# Patient Record
Sex: Female | Born: 1945 | Race: Black or African American | Hispanic: No | Smoking: Never smoker
Health system: Southern US, Community
[De-identification: ages and names within clinical notes are randomized; demographics above are authoritative.]

## PROBLEM LIST (undated history)

## (undated) DIAGNOSIS — K226 Gastro-esophageal laceration-hemorrhage syndrome: Secondary | ICD-10-CM

## (undated) DIAGNOSIS — Z22322 Carrier or suspected carrier of Methicillin resistant Staphylococcus aureus: Secondary | ICD-10-CM

## (undated) DIAGNOSIS — G40901 Epilepsy, unspecified, not intractable, with status epilepticus: Secondary | ICD-10-CM

## (undated) DIAGNOSIS — K859 Acute pancreatitis without necrosis or infection, unspecified: Secondary | ICD-10-CM

## (undated) DIAGNOSIS — E872 Acidosis: Secondary | ICD-10-CM

## (undated) DIAGNOSIS — B029 Zoster without complications: Secondary | ICD-10-CM

## (undated) DIAGNOSIS — K219 Gastro-esophageal reflux disease without esophagitis: Secondary | ICD-10-CM

## (undated) DIAGNOSIS — I509 Heart failure, unspecified: Secondary | ICD-10-CM

## (undated) DIAGNOSIS — J9601 Acute respiratory failure with hypoxia: Secondary | ICD-10-CM

## (undated) DIAGNOSIS — K279 Peptic ulcer, site unspecified, unspecified as acute or chronic, without hemorrhage or perforation: Secondary | ICD-10-CM

## (undated) DIAGNOSIS — D62 Acute posthemorrhagic anemia: Secondary | ICD-10-CM

## (undated) DIAGNOSIS — I272 Pulmonary hypertension, unspecified: Secondary | ICD-10-CM

## (undated) DIAGNOSIS — M1990 Unspecified osteoarthritis, unspecified site: Secondary | ICD-10-CM

## (undated) DIAGNOSIS — F329 Major depressive disorder, single episode, unspecified: Secondary | ICD-10-CM

## (undated) DIAGNOSIS — I371 Nonrheumatic pulmonary valve insufficiency: Secondary | ICD-10-CM

## (undated) DIAGNOSIS — R197 Diarrhea, unspecified: Secondary | ICD-10-CM

## (undated) DIAGNOSIS — F32A Depression, unspecified: Secondary | ICD-10-CM

## (undated) DIAGNOSIS — F419 Anxiety disorder, unspecified: Secondary | ICD-10-CM

## (undated) DIAGNOSIS — M797 Fibromyalgia: Secondary | ICD-10-CM

## (undated) DIAGNOSIS — D649 Anemia, unspecified: Secondary | ICD-10-CM

## (undated) DIAGNOSIS — N289 Disorder of kidney and ureter, unspecified: Secondary | ICD-10-CM

## (undated) DIAGNOSIS — I890 Lymphedema, not elsewhere classified: Secondary | ICD-10-CM

## (undated) DIAGNOSIS — M75101 Unspecified rotator cuff tear or rupture of right shoulder, not specified as traumatic: Secondary | ICD-10-CM

## (undated) DIAGNOSIS — I10 Essential (primary) hypertension: Secondary | ICD-10-CM

## (undated) DIAGNOSIS — I5021 Acute systolic (congestive) heart failure: Secondary | ICD-10-CM

## (undated) DIAGNOSIS — C801 Malignant (primary) neoplasm, unspecified: Secondary | ICD-10-CM

## (undated) DIAGNOSIS — M12811 Other specific arthropathies, not elsewhere classified, right shoulder: Secondary | ICD-10-CM

## (undated) HISTORY — PX: BREAST SURGERY: SHX581

## (undated) HISTORY — PX: CHOLECYSTECTOMY: SHX55

## (undated) HISTORY — PX: ABDOMINAL HYSTERECTOMY: SHX81

## (undated) HISTORY — PX: HAND TENDON SURGERY: SHX663

## (undated) HISTORY — PX: REPLACEMENT TOTAL KNEE BILATERAL: SUR1225

---

## 1997-07-27 ENCOUNTER — Ambulatory Visit (HOSPITAL_COMMUNITY): Admission: RE | Admit: 1997-07-27 | Discharge: 1997-07-27 | Payer: Self-pay | Admitting: Neurosurgery

## 1999-04-26 ENCOUNTER — Encounter: Payer: Self-pay | Admitting: Neurosurgery

## 1999-04-26 ENCOUNTER — Ambulatory Visit (HOSPITAL_COMMUNITY): Admission: RE | Admit: 1999-04-26 | Discharge: 1999-04-26 | Payer: Self-pay | Admitting: Neurosurgery

## 1999-07-08 ENCOUNTER — Ambulatory Visit (HOSPITAL_BASED_OUTPATIENT_CLINIC_OR_DEPARTMENT_OTHER): Admission: RE | Admit: 1999-07-08 | Discharge: 1999-07-08 | Payer: Self-pay | Admitting: Orthopedic Surgery

## 2000-02-22 ENCOUNTER — Emergency Department (HOSPITAL_COMMUNITY): Admission: EM | Admit: 2000-02-22 | Discharge: 2000-02-22 | Payer: Self-pay | Admitting: Emergency Medicine

## 2000-03-10 ENCOUNTER — Ambulatory Visit (HOSPITAL_BASED_OUTPATIENT_CLINIC_OR_DEPARTMENT_OTHER): Admission: RE | Admit: 2000-03-10 | Discharge: 2000-03-10 | Payer: Self-pay | Admitting: Orthopedic Surgery

## 2000-04-13 ENCOUNTER — Encounter (HOSPITAL_COMMUNITY): Admission: RE | Admit: 2000-04-13 | Discharge: 2000-05-13 | Payer: Self-pay | Admitting: Rheumatology

## 2000-04-30 ENCOUNTER — Emergency Department (HOSPITAL_COMMUNITY): Admission: EM | Admit: 2000-04-30 | Discharge: 2000-04-30 | Payer: Self-pay | Admitting: Emergency Medicine

## 2000-04-30 ENCOUNTER — Encounter: Payer: Self-pay | Admitting: Emergency Medicine

## 2000-06-15 ENCOUNTER — Encounter (HOSPITAL_COMMUNITY): Admission: RE | Admit: 2000-06-15 | Discharge: 2000-07-15 | Payer: Self-pay | Admitting: Oncology

## 2000-06-15 ENCOUNTER — Encounter: Admission: RE | Admit: 2000-06-15 | Discharge: 2000-06-15 | Payer: Self-pay | Admitting: Oncology

## 2000-07-14 ENCOUNTER — Ambulatory Visit (HOSPITAL_BASED_OUTPATIENT_CLINIC_OR_DEPARTMENT_OTHER): Admission: RE | Admit: 2000-07-14 | Discharge: 2000-07-14 | Payer: Self-pay | Admitting: Orthopedic Surgery

## 2000-08-10 ENCOUNTER — Encounter: Admission: RE | Admit: 2000-08-10 | Discharge: 2000-08-10 | Payer: Self-pay | Admitting: Oncology

## 2000-08-10 ENCOUNTER — Encounter (HOSPITAL_COMMUNITY): Admission: RE | Admit: 2000-08-10 | Discharge: 2000-09-09 | Payer: Self-pay | Admitting: Oncology

## 2000-08-10 ENCOUNTER — Encounter (HOSPITAL_COMMUNITY): Payer: Self-pay | Admitting: Oncology

## 2000-08-19 ENCOUNTER — Emergency Department (HOSPITAL_COMMUNITY): Admission: EM | Admit: 2000-08-19 | Discharge: 2000-08-19 | Payer: Self-pay | Admitting: *Deleted

## 2000-08-19 ENCOUNTER — Encounter: Payer: Self-pay | Admitting: *Deleted

## 2000-11-03 ENCOUNTER — Emergency Department (HOSPITAL_COMMUNITY): Admission: EM | Admit: 2000-11-03 | Discharge: 2000-11-03 | Payer: Self-pay | Admitting: Podiatry

## 2000-11-03 ENCOUNTER — Encounter: Payer: Self-pay | Admitting: Emergency Medicine

## 2000-12-09 ENCOUNTER — Ambulatory Visit (HOSPITAL_COMMUNITY): Admission: RE | Admit: 2000-12-09 | Discharge: 2000-12-09 | Payer: Self-pay | Admitting: Hematology and Oncology

## 2000-12-09 ENCOUNTER — Encounter: Payer: Self-pay | Admitting: Hematology and Oncology

## 2000-12-15 ENCOUNTER — Ambulatory Visit: Admission: RE | Admit: 2000-12-15 | Discharge: 2000-12-15 | Payer: Self-pay | Admitting: Hematology and Oncology

## 2000-12-15 ENCOUNTER — Encounter: Payer: Self-pay | Admitting: Hematology and Oncology

## 2000-12-21 ENCOUNTER — Encounter: Payer: Self-pay | Admitting: Hematology and Oncology

## 2000-12-21 ENCOUNTER — Ambulatory Visit (HOSPITAL_COMMUNITY): Admission: RE | Admit: 2000-12-21 | Discharge: 2000-12-21 | Payer: Self-pay | Admitting: Hematology and Oncology

## 2001-01-21 ENCOUNTER — Emergency Department (HOSPITAL_COMMUNITY): Admission: EM | Admit: 2001-01-21 | Discharge: 2001-01-22 | Payer: Self-pay | Admitting: *Deleted

## 2001-01-22 ENCOUNTER — Encounter: Payer: Self-pay | Admitting: *Deleted

## 2001-03-25 ENCOUNTER — Encounter: Payer: Self-pay | Admitting: General Surgery

## 2001-03-25 ENCOUNTER — Ambulatory Visit (HOSPITAL_COMMUNITY): Admission: RE | Admit: 2001-03-25 | Discharge: 2001-03-25 | Payer: Self-pay | Admitting: General Surgery

## 2001-04-17 ENCOUNTER — Encounter: Payer: Self-pay | Admitting: Emergency Medicine

## 2001-04-17 ENCOUNTER — Emergency Department (HOSPITAL_COMMUNITY): Admission: EM | Admit: 2001-04-17 | Discharge: 2001-04-17 | Payer: Self-pay | Admitting: Emergency Medicine

## 2001-06-28 ENCOUNTER — Encounter (HOSPITAL_COMMUNITY): Admission: RE | Admit: 2001-06-28 | Discharge: 2001-09-26 | Payer: Self-pay | Admitting: Dentistry

## 2001-07-20 ENCOUNTER — Encounter (HOSPITAL_COMMUNITY): Payer: Self-pay | Admitting: Dentistry

## 2001-07-23 ENCOUNTER — Emergency Department (HOSPITAL_COMMUNITY): Admission: EM | Admit: 2001-07-23 | Discharge: 2001-07-23 | Payer: Self-pay | Admitting: Internal Medicine

## 2001-08-05 ENCOUNTER — Encounter: Payer: Self-pay | Admitting: Hematology and Oncology

## 2001-08-05 ENCOUNTER — Ambulatory Visit (HOSPITAL_COMMUNITY): Admission: RE | Admit: 2001-08-05 | Discharge: 2001-08-05 | Payer: Self-pay | Admitting: Hematology and Oncology

## 2001-10-10 ENCOUNTER — Emergency Department (HOSPITAL_COMMUNITY): Admission: EM | Admit: 2001-10-10 | Discharge: 2001-10-10 | Payer: Self-pay | Admitting: Emergency Medicine

## 2001-10-12 ENCOUNTER — Encounter (HOSPITAL_COMMUNITY): Admission: RE | Admit: 2001-10-12 | Discharge: 2001-11-11 | Payer: Self-pay | Admitting: General Surgery

## 2001-10-13 ENCOUNTER — Encounter: Payer: Self-pay | Admitting: General Surgery

## 2001-10-13 ENCOUNTER — Ambulatory Visit (HOSPITAL_COMMUNITY): Admission: RE | Admit: 2001-10-13 | Discharge: 2001-10-13 | Payer: Self-pay | Admitting: General Surgery

## 2001-11-16 ENCOUNTER — Emergency Department (HOSPITAL_COMMUNITY): Admission: EM | Admit: 2001-11-16 | Discharge: 2001-11-16 | Payer: Self-pay | Admitting: Emergency Medicine

## 2002-02-20 ENCOUNTER — Encounter: Payer: Self-pay | Admitting: Emergency Medicine

## 2002-02-20 ENCOUNTER — Emergency Department (HOSPITAL_COMMUNITY): Admission: EM | Admit: 2002-02-20 | Discharge: 2002-02-20 | Payer: Self-pay | Admitting: Emergency Medicine

## 2002-02-21 ENCOUNTER — Emergency Department (HOSPITAL_COMMUNITY): Admission: EM | Admit: 2002-02-21 | Discharge: 2002-02-21 | Payer: Self-pay | Admitting: *Deleted

## 2002-02-21 ENCOUNTER — Encounter: Payer: Self-pay | Admitting: *Deleted

## 2002-03-28 ENCOUNTER — Encounter: Admission: RE | Admit: 2002-03-28 | Discharge: 2002-03-28 | Payer: Self-pay | Admitting: Oncology

## 2002-04-07 ENCOUNTER — Ambulatory Visit (HOSPITAL_COMMUNITY): Admission: RE | Admit: 2002-04-07 | Discharge: 2002-04-07 | Payer: Self-pay | Admitting: General Surgery

## 2002-04-07 ENCOUNTER — Encounter: Payer: Self-pay | Admitting: General Surgery

## 2002-04-25 ENCOUNTER — Encounter: Admission: RE | Admit: 2002-04-25 | Discharge: 2002-04-25 | Payer: Self-pay | Admitting: Dentistry

## 2002-05-11 ENCOUNTER — Encounter: Admission: RE | Admit: 2002-05-11 | Discharge: 2002-05-11 | Payer: Self-pay | Admitting: Oncology

## 2002-05-13 ENCOUNTER — Emergency Department (HOSPITAL_COMMUNITY): Admission: EM | Admit: 2002-05-13 | Discharge: 2002-05-13 | Payer: Self-pay | Admitting: *Deleted

## 2002-06-23 ENCOUNTER — Encounter: Admission: RE | Admit: 2002-06-23 | Discharge: 2002-06-23 | Payer: Self-pay | Admitting: Oncology

## 2002-06-23 ENCOUNTER — Encounter (HOSPITAL_COMMUNITY): Admission: RE | Admit: 2002-06-23 | Discharge: 2002-07-23 | Payer: Self-pay | Admitting: Oncology

## 2002-07-28 ENCOUNTER — Encounter: Admission: RE | Admit: 2002-07-28 | Discharge: 2002-07-28 | Payer: Self-pay | Admitting: Oncology

## 2002-07-28 ENCOUNTER — Encounter (HOSPITAL_COMMUNITY): Payer: Self-pay | Admitting: Oncology

## 2002-07-28 ENCOUNTER — Encounter (HOSPITAL_COMMUNITY): Admission: RE | Admit: 2002-07-28 | Discharge: 2002-08-27 | Payer: Self-pay | Admitting: Oncology

## 2002-08-18 ENCOUNTER — Encounter (HOSPITAL_COMMUNITY): Admission: RE | Admit: 2002-08-18 | Discharge: 2002-09-17 | Payer: Self-pay | Admitting: Rheumatology

## 2003-01-10 ENCOUNTER — Encounter: Admission: RE | Admit: 2003-01-10 | Discharge: 2003-01-10 | Payer: Self-pay | Admitting: Oncology

## 2003-01-10 ENCOUNTER — Encounter (HOSPITAL_COMMUNITY): Admission: RE | Admit: 2003-01-10 | Discharge: 2003-02-09 | Payer: Self-pay | Admitting: Oncology

## 2003-02-16 ENCOUNTER — Encounter (HOSPITAL_COMMUNITY): Admission: RE | Admit: 2003-02-16 | Discharge: 2003-03-18 | Payer: Self-pay | Admitting: Oncology

## 2003-02-16 ENCOUNTER — Ambulatory Visit (HOSPITAL_COMMUNITY): Admission: RE | Admit: 2003-02-16 | Discharge: 2003-02-16 | Payer: Self-pay | Admitting: General Surgery

## 2003-02-16 ENCOUNTER — Encounter: Admission: RE | Admit: 2003-02-16 | Discharge: 2003-02-16 | Payer: Self-pay | Admitting: Oncology

## 2003-03-01 ENCOUNTER — Emergency Department (HOSPITAL_COMMUNITY): Admission: EM | Admit: 2003-03-01 | Discharge: 2003-03-01 | Payer: Self-pay | Admitting: Emergency Medicine

## 2003-03-24 ENCOUNTER — Encounter (HOSPITAL_COMMUNITY): Admission: RE | Admit: 2003-03-24 | Discharge: 2003-04-23 | Payer: Self-pay | Admitting: Oncology

## 2003-03-24 ENCOUNTER — Encounter: Admission: RE | Admit: 2003-03-24 | Discharge: 2003-03-24 | Payer: Self-pay | Admitting: Oncology

## 2003-04-10 ENCOUNTER — Ambulatory Visit (HOSPITAL_COMMUNITY): Admission: RE | Admit: 2003-04-10 | Discharge: 2003-04-10 | Payer: Self-pay | Admitting: General Surgery

## 2003-04-27 ENCOUNTER — Encounter: Admission: RE | Admit: 2003-04-27 | Discharge: 2003-04-27 | Payer: Self-pay | Admitting: Oncology

## 2003-06-16 ENCOUNTER — Encounter: Admission: RE | Admit: 2003-06-16 | Discharge: 2003-06-16 | Payer: Self-pay | Admitting: Oncology

## 2003-06-16 ENCOUNTER — Encounter (HOSPITAL_COMMUNITY): Admission: RE | Admit: 2003-06-16 | Discharge: 2003-07-16 | Payer: Self-pay | Admitting: Oncology

## 2003-07-17 ENCOUNTER — Encounter (HOSPITAL_COMMUNITY): Admission: RE | Admit: 2003-07-17 | Discharge: 2003-08-16 | Payer: Self-pay | Admitting: Oncology

## 2003-07-17 ENCOUNTER — Encounter: Admission: RE | Admit: 2003-07-17 | Discharge: 2003-07-17 | Payer: Self-pay | Admitting: Oncology

## 2003-08-25 ENCOUNTER — Encounter (HOSPITAL_COMMUNITY): Admission: RE | Admit: 2003-08-25 | Discharge: 2003-09-24 | Payer: Self-pay | Admitting: Oncology

## 2003-08-25 ENCOUNTER — Encounter: Admission: RE | Admit: 2003-08-25 | Discharge: 2003-08-25 | Payer: Self-pay | Admitting: Oncology

## 2003-09-06 ENCOUNTER — Ambulatory Visit: Payer: Self-pay | Admitting: Dentistry

## 2003-09-22 ENCOUNTER — Emergency Department (HOSPITAL_COMMUNITY): Admission: EM | Admit: 2003-09-22 | Discharge: 2003-09-22 | Payer: Self-pay | Admitting: Emergency Medicine

## 2003-09-25 ENCOUNTER — Encounter: Admission: RE | Admit: 2003-09-25 | Discharge: 2003-10-06 | Payer: Self-pay | Admitting: Oncology

## 2003-09-25 ENCOUNTER — Encounter (HOSPITAL_COMMUNITY): Admission: RE | Admit: 2003-09-25 | Discharge: 2003-10-06 | Payer: Self-pay | Admitting: Oncology

## 2003-09-26 ENCOUNTER — Ambulatory Visit: Payer: Self-pay | Admitting: Dentistry

## 2003-10-06 ENCOUNTER — Encounter (HOSPITAL_BASED_OUTPATIENT_CLINIC_OR_DEPARTMENT_OTHER): Admission: RE | Admit: 2003-10-06 | Discharge: 2003-10-11 | Payer: Self-pay | Admitting: Internal Medicine

## 2003-10-06 ENCOUNTER — Ambulatory Visit (HOSPITAL_COMMUNITY): Admission: RE | Admit: 2003-10-06 | Discharge: 2003-10-06 | Payer: Self-pay | Admitting: Oncology

## 2003-10-06 ENCOUNTER — Ambulatory Visit: Payer: Self-pay | Admitting: Dentistry

## 2003-10-11 ENCOUNTER — Encounter (HOSPITAL_COMMUNITY): Admission: RE | Admit: 2003-10-11 | Discharge: 2003-11-10 | Payer: Self-pay | Admitting: Oncology

## 2003-10-11 ENCOUNTER — Encounter: Admission: RE | Admit: 2003-10-11 | Discharge: 2003-10-11 | Payer: Self-pay | Admitting: Oncology

## 2003-10-17 ENCOUNTER — Ambulatory Visit: Payer: Self-pay | Admitting: Dentistry

## 2003-11-08 ENCOUNTER — Ambulatory Visit (HOSPITAL_COMMUNITY): Payer: Self-pay | Admitting: Oncology

## 2003-11-22 ENCOUNTER — Encounter (HOSPITAL_COMMUNITY): Admission: RE | Admit: 2003-11-22 | Discharge: 2003-12-22 | Payer: Self-pay | Admitting: Oncology

## 2003-11-22 ENCOUNTER — Encounter: Admission: RE | Admit: 2003-11-22 | Discharge: 2003-11-22 | Payer: Self-pay | Admitting: Oncology

## 2003-11-28 ENCOUNTER — Ambulatory Visit: Payer: Self-pay | Admitting: Dentistry

## 2004-02-29 ENCOUNTER — Encounter (HOSPITAL_COMMUNITY): Admission: RE | Admit: 2004-02-29 | Discharge: 2004-03-30 | Payer: Self-pay | Admitting: Oncology

## 2004-03-06 ENCOUNTER — Ambulatory Visit (HOSPITAL_COMMUNITY): Payer: Self-pay | Admitting: Oncology

## 2004-03-24 ENCOUNTER — Emergency Department (HOSPITAL_COMMUNITY): Admission: EM | Admit: 2004-03-24 | Discharge: 2004-03-24 | Payer: Self-pay | Admitting: Emergency Medicine

## 2004-04-11 ENCOUNTER — Encounter (HOSPITAL_COMMUNITY): Admission: RE | Admit: 2004-04-11 | Discharge: 2004-05-11 | Payer: Self-pay | Admitting: Oncology

## 2004-04-11 ENCOUNTER — Encounter: Admission: RE | Admit: 2004-04-11 | Discharge: 2004-04-11 | Payer: Self-pay | Admitting: Oncology

## 2004-04-25 ENCOUNTER — Ambulatory Visit (HOSPITAL_COMMUNITY): Payer: Self-pay | Admitting: Oncology

## 2004-04-26 ENCOUNTER — Ambulatory Visit: Payer: Self-pay | Admitting: Dentistry

## 2004-05-28 ENCOUNTER — Encounter: Admission: RE | Admit: 2004-05-28 | Discharge: 2004-05-28 | Payer: Self-pay | Admitting: Oncology

## 2004-05-28 ENCOUNTER — Encounter (HOSPITAL_COMMUNITY): Admission: RE | Admit: 2004-05-28 | Discharge: 2004-06-27 | Payer: Self-pay | Admitting: Oncology

## 2004-05-29 ENCOUNTER — Ambulatory Visit: Payer: Self-pay | Admitting: Dentistry

## 2004-05-29 ENCOUNTER — Emergency Department (HOSPITAL_COMMUNITY): Admission: EM | Admit: 2004-05-29 | Discharge: 2004-05-30 | Payer: Self-pay | Admitting: Emergency Medicine

## 2004-06-06 ENCOUNTER — Ambulatory Visit: Payer: Self-pay | Admitting: Internal Medicine

## 2004-06-12 ENCOUNTER — Ambulatory Visit (HOSPITAL_COMMUNITY): Admission: RE | Admit: 2004-06-12 | Discharge: 2004-06-12 | Payer: Self-pay | Admitting: Internal Medicine

## 2004-06-12 ENCOUNTER — Ambulatory Visit: Payer: Self-pay | Admitting: Internal Medicine

## 2004-06-13 ENCOUNTER — Ambulatory Visit (HOSPITAL_COMMUNITY): Payer: Self-pay | Admitting: Oncology

## 2004-06-27 ENCOUNTER — Ambulatory Visit: Payer: Self-pay | Admitting: Internal Medicine

## 2004-06-28 ENCOUNTER — Encounter (HOSPITAL_COMMUNITY): Admission: RE | Admit: 2004-06-28 | Discharge: 2004-07-28 | Payer: Self-pay | Admitting: Oncology

## 2004-06-28 ENCOUNTER — Encounter: Admission: RE | Admit: 2004-06-28 | Discharge: 2004-06-28 | Payer: Self-pay | Admitting: Oncology

## 2004-07-05 ENCOUNTER — Ambulatory Visit (HOSPITAL_COMMUNITY): Admission: RE | Admit: 2004-07-05 | Discharge: 2004-07-05 | Payer: Self-pay | Admitting: Internal Medicine

## 2004-07-12 ENCOUNTER — Ambulatory Visit (HOSPITAL_COMMUNITY): Admission: RE | Admit: 2004-07-12 | Discharge: 2004-07-12 | Payer: Self-pay | Admitting: Internal Medicine

## 2004-07-31 ENCOUNTER — Ambulatory Visit: Payer: Self-pay | Admitting: Internal Medicine

## 2004-08-01 ENCOUNTER — Ambulatory Visit (HOSPITAL_COMMUNITY): Admission: RE | Admit: 2004-08-01 | Discharge: 2004-08-01 | Payer: Self-pay | Admitting: Internal Medicine

## 2004-08-01 ENCOUNTER — Ambulatory Visit (HOSPITAL_COMMUNITY): Payer: Self-pay | Admitting: Oncology

## 2004-08-01 ENCOUNTER — Encounter: Admission: RE | Admit: 2004-08-01 | Discharge: 2004-08-01 | Payer: Self-pay | Admitting: Oncology

## 2004-09-06 ENCOUNTER — Encounter: Admission: RE | Admit: 2004-09-06 | Discharge: 2004-10-04 | Payer: Self-pay | Admitting: Oncology

## 2005-08-02 ENCOUNTER — Emergency Department (HOSPITAL_COMMUNITY): Admission: EM | Admit: 2005-08-02 | Discharge: 2005-08-02 | Payer: Self-pay | Admitting: Emergency Medicine

## 2007-02-06 ENCOUNTER — Emergency Department (HOSPITAL_COMMUNITY): Admission: EM | Admit: 2007-02-06 | Discharge: 2007-02-06 | Payer: Self-pay | Admitting: Emergency Medicine

## 2007-02-15 ENCOUNTER — Emergency Department (HOSPITAL_COMMUNITY): Admission: EM | Admit: 2007-02-15 | Discharge: 2007-02-15 | Payer: Self-pay | Admitting: Emergency Medicine

## 2007-03-31 ENCOUNTER — Encounter (HOSPITAL_COMMUNITY): Admission: RE | Admit: 2007-03-31 | Discharge: 2007-04-30 | Payer: Self-pay | Admitting: *Deleted

## 2010-01-26 ENCOUNTER — Encounter (HOSPITAL_COMMUNITY): Payer: Self-pay | Admitting: Oncology

## 2010-01-27 ENCOUNTER — Encounter: Payer: Self-pay | Admitting: Internal Medicine

## 2010-01-27 ENCOUNTER — Encounter (HOSPITAL_COMMUNITY): Payer: Self-pay | Admitting: Oncology

## 2010-05-24 NOTE — Consult Note (Signed)
NAME:  Catherine Mccullough, Catherine Mccullough                        ACCOUNT NO.:  [id number]   MEDICAL RECORD NO.:  [id number]                   PATIENT TYPE:   LOCATION:                                       FACILITY:  [facility]   PHYSICIAN:  Aundra Dubin, M.D.            DATE OF BIRTH:  04-01-1945   DATE OF CONSULTATION:  08/18/2002  DATE OF DISCHARGE:  05/13/2002                                   CONSULTATION   CHIEF COMPLAINT:  Fibromyalgia.   Ms. Biancardi is a 65 year old black female with a history of breast cancer  and recurrence in 1999.  She has been diagnosed with fibromyalgia for  several years at this time.  She describes a pattern of aching, throbbing,  toothache-like pain to her shoulders, neck, back, thighs, ankles, fingers  and wrists.  She feels that her wrists have been occasionally swollen. Her  energy level is low and is not good.  She wakes up fairly frequently at  night, sometimes with sweats.  Pain can wake her up with achiness.  There  has been no weight loss as I can document from the chart.  She has had no  true fevers or rashes.  She has headaches 2-3 times a week.  She has some  slight diarrhea, but denies constipation, blood or mucus to the bowel  movement.  There has been no chest pain or shortness of breath. Ms. Reader  does not have radiating pain into her legs.   PAST MEDICAL/SURGICAL HISTORY:  1. History of anemia.  2. From review of her x-ray jacket, she had a lumbar and thoracic MRI in     July 2004.  The findings showed no metastatic disease.  There was no     spinal stenosis or nerve root compression of the thoracic spine.  The     lumbar MRI showed degenerative changes.  There was no metastasis.  There     were multilevel degenerative changes and a right paracentral disk     extrusion at L2 and L3 not previously seen. There was some encroachment     on the left L5 nerve root.  3. Borderline diabetes by her report.   MEDICINES:  1. HCTZ 50 mg daily.  2.  Aciphex 20 mg daily.  3. Allegra 180 mg daily.  4. Tamoxifen 10 mg b.i.d.  5. MiraLax p.r.n.  6. Calcium with vitamin D daily.  7. Vioxx.  8. Duragesic patch 25 mg every 3 days.  9. Aranesp 60 mcg every 2 weeks.  10.      Guaifenesin.  11.      Lorazepam 0.5 mg b.i.d.  12.      Flexeril 10 mg p.r.n.  13.      Levosin 0.125 mg b.i.d.  14.      Patanol eye drops.   DRUG INTOLERANCE:  IV DYE, ASPIRIN, CODEINE, SULFA, DILANTIN.   PHYSICAL EXAMINATION:  VITAL SIGNS:  Weight 144 pounds.  Blood pressure  130/70, respirations 16.  GENERAL:  She is in no distress.  She moves well in the room and getting  onto the table. She was tearful at the first of the interview, but as we  talked about how much pain she has had, at points she became tearful.  SKIN: Clear.  HEENT:  PERL/EOMI.  Mouth clear.  NECK:  Negative JVD.  LUNGS:  Clear.  HEART:  Regular, no murmur.  ABDOMEN:  Negative HSM with mild diffuse tenderness.  MUSCULOSKELETAL EXAM: The hand have a puffy appearance and they are slightly  tender, but I do not believe that this is arthritic swelling.  Wrists,  elbows, shoulders, and neck had a good range of motion with mild discomfort.  Trigger points of the elbows, shoulder, neck, __________, anterior chest,  upper paraspinous muscles along the paraspinous muscles were tender with  significant wincing.  Hips, knees, ankles, and feet had a good range of  motion; had mild stiffness; but showed no active arthritis.  NEUROLOGIC: Strength is 5/5.  DTRs are 2+ throughout.  Negative SLRs.   ASSESSMENT/PLAN:  1. Fibromyalgia.  I agree that this is a reasonable description of her     symptom complex.  I am pleased that she is working now with the pain     center and this is Dr. Vear Clock. He will most likely treat her pain more     aggressively than I will.  I have discussed with her trying to improve     her sleep with a combination of lorazepam and Flexeril.  The limits would     be 1 mg  lorazepam and 20 mg Flexeril. She might graduate up to this level     or use a smaller amount which is fine.  2. Breast cancer.  3. Back pain.  She, again, is working with Dr. Vear Clock.  4. Possible borderline diabetes.  5. Ms. Swopes has a chronic pain problem.  I believe Dr. Vear Clock will     treat this more aggressively than I will. At some point she might do well     with treatment with an SSRI antidepressant-type medicine.  I am not     finding any connective tissue disorder. I will see her back on a p.r.n.     basis.                                               Aundra Dubin, M.D.    WWT/MEDQ  D:  08/18/2002  T:  08/19/2002  Job:  [id number]   cc:   Dirk Dress. Katrinka Blazing, M.D.  [address]  [place]  [place] [address]  Fax: [phone]   Ladona Horns. Neijstrom, MD  [address]  [place]  [place] [address]  Fax: [phone]

## 2010-05-24 NOTE — Op Note (Signed)
[place]  Patient:    NORALEE, DUTKO                     MRN: [id number] Proc. Date: 03/10/00 Adm. Date:  [id number] Attending:  Susa Day                           Operative Report  PREOPERATIVE DIAGNOSIS:  Chronic ulnar-sided pain, left wrist, status post fall on February 07, 2000.  Probable triangular fibrocartilage tear, rule out peripheral triangular fibrocartilage tear, lunotriquetral tear, and/or scapholunate interosseous ligament tear.  POSTOPERATIVE DIAGNOSIS:  Large central degenerative triangular fibrocartilage tear with no sign of peripheral triangular fibrocartilage tear.  There was no sign of significant ulnar carpal abutment, i.e., no sign of lunate chondromalacia or a lunotriquetral tear.  PROCEDURES: 1. Diagnostic arthroscopy, right wrist. 2. Arthroscopic debridement of degenerative triangular fibrocartilage tear. 3. Limited debridement of scapholunate interosseous ligament fray.  SURGEON:  Katy Fitch. Sypher, Montez Hageman., M.D.  ASSISTANT:  Jonni Sanger, P.A.  ANESTHESIA:  General by LMA supervised by the anesthesiologist, Maren Beach, M.D.  INDICATIONS;  Barbar Brede is a 65 year old woman currently disabled due to a chronic history of orthopedic predicaments and a history of breast cancer, status post radiation and chemotherapy.  She fell on February 07, 2000, sustaining an injury to her left wrist.  Since that time, she has had marked pain in the ulnar aspect of her wrist, limiting her ability to use her left hand.  She sought an acute hand surgery consult at the suggestion of her general orthopedic surgeon, Dr. Thurston Hole, and is brought to the operating room for diagnostic arthroscopy at this time after plain films revealed an ulnar plus variant and signs of a probable triangular fibrocartilage tear were noted on physical exam.  DESCRIPTION OF PROCEDURE:  Britainy Kozub was brought to the operating  room and placed in supine position on the operating table.  Following induction of general anesthesia by LMA, the left arm was prepped with Betadine soap and solution and sterilely draped.  Ancef 1 g was administered as an IV prophylactic antibiotic.  The wrist was distracted in a tower designed for wrist arthroscopy with fingertraps on the index and long fingers and counter traction on the forearm. Ten pounds of traction was applied.  The arthroscope was introduced with standard blunt technique through a 3-4 dorsal portal.  Diagnostic arthroscopy revealed intact hyaline articular cartilage surfaces on the scapholunate and triquetrum, with minimal fray of the scapholunate interosseous ligament and an intact lunotriquetral interosseous ligament.  The volar radiocarpal ligaments and the ulnar carpal ligaments were intact.  The triangular fibrocartilage had a very large central degenerative tear adjacent to the sigmoid notch of the radius.  This extended from the palmar radioulnar ligament to the dorsal radioulnar ligament.  This was thoroughly debrided with a suction basket forceps and a suction shaver. The peripheral triangular fibrocartilage was intact.  The ulnar head did not have chondromalacia, and there was minimal chondromalacia on the ulnar aspect of the lunate.  The scope was then switched to the 6R portal, and the scapholunate interosseous ligament and the dorsal capsule inspected.  Moderate synovitis dorsally was debrided with the suction shaver.  The scapholunate interosseous ligament was palpated with a nerve hook and found to be intact.  Frayed areas of cartilage on the lunate were gently debrided with a micro-rongeur.  The joint was then lavaged  with sterile saline.  There did not appear to be any signs of a dissociative carpal instability. The triangular fibrocartilage tear will be treated with simple debridement only, in that there was no sign of ulnocarpal abutment on  clinical examination.  The arthroscopic equipment was removed, and the portals were removed with Steri-Strips.  The wrist was placed in a compressive dressing with a volar plaster splint and maintained in a neutral position.  There were no apparent complications.  For aftercare, Ms. Gaber is advised to immediately resume her Coumadin anticoagulation as well as her routine medications.  She is given prescriptions for Lortab one or two tablets p.o. q.6h. p.r.n. pain, also Keflex 500 mg one p.o. q.8h. x 4 days for prophylactic antibiotic.  She will return for follow-up at our office in approximately five to seven days for dressing change and initiation of exercise program.  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 DD:  03/10/00 TD:  03/11/00 Job: [id number]

## 2010-05-24 NOTE — Op Note (Signed)
[name]  Patient:    Catherine Mccullough, Catherine Mccullough                MRN: [id number] Proc. Date: 07/14/00 Adm. Date:  [id number] Attending:  Sypher, Douglass Rivers CC:         Katy Fitch. Sypher, Montez Hageman., M.D. 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  PROCEDURE:  Release of right transverse carpal ligament.  SURGEON:  Katy Fitch. Sypher, Montez Hageman., M.D.  ASSISTANT:  Jonni Sanger, P.A.  ANESTHESIA:  General by LMA supervising anesthesiologist is Guadalupe Maple, M.D.  INDICATIONS:  [name] is a 65 year old woman with multiple medical problems including breast cancer status post mastectomy and chemotherapy with a history of bone metastases.  She has multiple drug allergies and chronic fibromyalgia pain.  Recently she was noted to have bilateral hand numbness with pain and symptoms and signs consistent with carpal tunnel syndrome.  Electrodiagnostic studies confirmed presence of carpal tunnel syndrome.  Due to her failure to respond to nonoperative measures, she is brought to the operating room at this time for release of her right transverse carpal ligament.  DESCRIPTION OF PROCEDURE:  [name] was brought to the operating room and placed in supine position on operating table.  Following induction of general anesthesia by LMA, the right arm was prepped with Betadine soap and solution and sterilely draped.  Following exsanguination of the limb with Esmarch the arterial tourniquet was inflated to 220 mmHg.  The procedure commenced with a shortened incision in the line of the ring finger in the palm.  The subcutaneous tissues were carefully divided revealing the palmar fascia.  This was split longitudinally which revealed the common sensory branch of the median nerve.   These were followed back to the transverse carpal ligament which was carefully isolated the median nerve.  The ligament was then released on its ulnar border extending distal forearm.  This widely opened the carpal canal.  No masses or other thickness were noted.  The wound was then repaired with intradermal 3-0 Prolene and Steri-Strips. Compressive dressing was applied with volar plaster splint maintaining the wrist in 5 degrees of dorsiflexion. DD:  07/14/00 TD:  07/14/00 Job: [id number]

## 2010-05-24 NOTE — Consult Note (Signed)
NAME:  Catherine Mccullough, Catherine Mccullough              ACCOUNT NO.:  [id number]   MEDICAL RECORD NO.:  [id number]          PATIENT TYPE:  AMB   LOCATION:  DAY                           FACILITY:  [facility]   PHYSICIAN:  R. Roetta Sessions, M.D. DATE OF BIRTH:  Apr 30, 1945   DATE OF CONSULTATION:  DATE OF DISCHARGE:                                   CONSULTATION   REASON FOR CONSULTATION:  Nausea, vomiting, rectal bleeding.   HISTORY OF PRESENT ILLNESS:  Ms. Catherine Mccullough is a pleasant 65 year old  African-American female who tells me she has a history of breast cancer  status post right mastectomy, lymph node dissection by Dr. Katrinka Blazing previously  and had diagnoses back in the 1980s, as she states, and has had chemotherapy  now.  She initially said that she now has bony metastases.  She has had two  PET scans but then tells me that she just has degenerative joint disease.  Unfortunately, I do not have any records for review at the time of  consultation.   As I understand it, the reason for consultation today is a one-month history  of vomiting.  She states she has vomited some gross blood on more than one  occasion in the past one month.  She has also had some black, tarry stools  as well in the setting of chronic constipation.  She has been to the  emergency room three times, once via EMS.  She is frustrated that the workup  did not yield any significant findings.  She was discharged home after each  of the three visits.  Labs from Jun 05, 2004, include a CBC which revealed a  white count of 7.5, H&H of 11.4 and 33.8, MCV 92.2, platelet count 311,000.  Potassium is slightly low at 3.3, albumin 4.1, SGOT and SGPT 31 and 17,  bilirubin 0.4.  Protime is 11.9 seconds with INR 0.9.  Plain abdominal films  from May 29, 2004, demonstrated a picture consistent with constipation but  nothing else.  A CA27-29 cancer marker came back normal at 16 recently.   In addition to nausea and vomiting and her reported  hematemesis, she does  describe early satiety and esophageal dysphagia.  She has never had her  upper GI tract imaged per her report but then goes on to say that she was  diagnosed with a duodenal ulcer when she was hospitalized up in [place] some 20  years ago.  She tells me Dr. Elpidio Anis performed a colonoscopy on her  because blood was found in her stool, but no significant abnormalities were  reportedly found.  She denies any weight loss recently.  She denies any  history of chronic liver disease.  She denies the use of any nonsteroidals  aside from the Mobic which she is taking.  She currently takes Aciphex 20 mg  orally daily as well.   PAST MEDICAL HISTORY:  1.  Breast cancer.  2.  She has been diabetic for the past two years.  3.  History of breast cancer with bone involvement.  4.  Hypertension.  5.  Possible sleep  apnea.   PAST SURGICAL HISTORY:  1.  Hysterectomy.  2.  Right breast surgery, lymph node dissection.  3.  Carpal tunnel bilaterally.  4.  Left knee surgery.  5.  Colonoscopy as outlined above.   CURRENT MEDICATIONS:  1.  Aranesp 100 mcg every six weeks.  2.  Cyclobenzaprine HCl p.r.n.  3.  Hyoscyamine sulfate p.r.n.  4.  Lorazepam 1 mg p.r.n.  5.  APAP/butalbital two q.i.d. p.r.n.  6.  Zoloft 150 mg daily.  7.  Amoxil 500 mg before dental work.  8.  __________ fumarate p.r.n. rash  9.  Mobic 15 mg daily.  10. K-Clor 20 mEq daily.  11. Nasacort AQ one spray daily.  12. Allegra 180 mg daily.  13. Duragesic 75 mcg patch daily.  14. Gabapentin 100 mg two at bedtime.  15. Lotrel 5/10 mg once daily.  16. Aciphex 20 mg daily.  17. Guaifenesin-DM two tablespoons q.4h. p.r.n.  18. Zometa injections.  19. Lyrica 50 mg one t.i.d.  20. __________ 2% p.r.n.  21. Patanol eye drops 0.1% two drops each eye daily.  22. Albuterol inhaler p.r.n.  23. Femara 2.5 mg daily.   ALLERGIES:  SULFA, IVP DYE, ASPIRIN, DILANTIN, ULTRAM, LATEX PRODUCTS.   PAST MEDICAL  HISTORY:  History of heart murmur, for which she takes SBE  prophylaxis.   FAMILY HISTORY:  No history of chronic GI or liver illness.  Father is alive  with heart disease.  Mother died at age 62 with complications related to  diabetes.  No history of chronic GI or liver illness.   SOCIAL HISTORY:  The patient is divorced.  She is disabled.  She has two  grown children, a boy age 86 and a daughter age 32.  She has two  grandchildren.  She does not use tobacco.  She occasionally consumes an  alcoholic beverage.   REVIEW OF SYSTEMS:  No recent chest pain or dyspnea on exertion.  Otherwise  as in the history of present illness.   PHYSICAL EXAMINATION:  GENERAL:  A somewhat chronically ill-appearing 52-  year-old lady resting comfortably, in no acute distress.  VITAL SIGNS:  Weight 123.5, height 4 feet 8 inches.  Temperature 99.6, BP  142/80, pulse 64.  SKIN: Warm and dry.  HEENT:  No scleral icterus.  Conjunctivae are pink.  Oral cavity:  Dentition  in a fairly poor state of repair.  She wears a partial below.  NECK:  JVD is not prominent.  CHEST:  Lungs are clear to auscultation.  CARDIAC:  Regular rate and rhythm with a 2/6 systolic ejection murmur, left  sternal border.  BREASTS:  Exam is deferred.  ABDOMEN:  Nondistended, positive bowel sounds.  She is diffusely tender to  palpation on the upper abdomen, left and right quadrants, somewhat  diminished with distraction.  I do not appreciate any mass or organomegaly.  EXTREMITIES:  No edema.  RECTAL:  Good sphincter tone.  No impaction.  No mass in the rectal vault.  Has no stool in the rectal vault.  Mucus Hemoccult-negative.   IMPRESSION:  Ms. Catherine Mccullough is a pleasant 65 year old lady with a rather  complex medical history, who has now referred for a reported one-month  history of nausea and vomiting with some hematemesis.  She also reports melena.  She has been to the emergency room three times.  She has now been  referred  by Dr. Mariel Sleet for further evaluation.   She does have a mild anemia.  In  addition to the above-mentioned symptoms,  she complains of odynophagia and dysphagia along with early satiety.  She is  a diabetic.  She has a multitude of possible GI issues at this time to  consider.   If she has had an upper gastrointestinal bleed, I am certainly concerned  about peptic ulcer disease given she is taking Mobic.  Her polypharmacy and  diabetes puts her at risk for delayed gastric emptying and secondary erosive  reflux esophagitis.   I am glad to see that she says she had a negative colonoscopy within the  past six months.   RECOMMENDATIONS:  1.  Continue Aciphex 20 mg orally daily.  2.  I have offered Ms. Catherine Mccullough an EGD to further evaluate her symptoms.      This approach was discussed with the patient and son at length, the      potential risks, benefits, and alternatives have been reviewed.  If she      has a ring or stricture that is amenable to dilation, this maneuver will      be performed as well.  Her questions were answered, she is agreeable.      Will set this up as soon as can be arranged.  In addition, would like to      retrieve the colonoscopy report by Dr. Katrinka Blazing for review.   Will make further recommendations in the very near future.  I would like to  thank Dr. Glenford Peers for allowing me to see this nice lady.      RMR/MEDQ  D:  06/06/2004  T:  06/06/2004  Job:  [id number]   cc:   Ladona Horns. Neijstrom, MD  [address]  [place]  [place] [address]  Fax: [phone]

## 2010-05-24 NOTE — Op Note (Signed)
NAME:  Catherine Mccullough, Catherine Mccullough              ACCOUNT NO.:  [id number]   MEDICAL RECORD NO.:  [id number]          PATIENT TYPE:  AMB   LOCATION:  DAY                           FACILITY:  [facility]   PHYSICIAN:  R. Roetta Sessions, M.D. DATE OF BIRTH:  1945/12/13   DATE OF PROCEDURE:  06/12/2004  DATE OF DISCHARGE:                                 OPERATIVE REPORT   PROCEDURE:  Esophagogastroduodenoscopy with Elease Hashimoto dilation.   INDICATIONS FOR PROCEDURE:  A 65 year old lady reported intermittent  hematemesis and melena. She is being seen by Ladona Horns. Neijstrom, MD primarily  for history of breast cancer. She takes Mobic regularly. EGD is now being  done. This approach has been discussed with the patient at length. The  potential risks, benefits, and alternatives have been reviewed, questions  answered. Please the documentation in medical record.   MONITORING:  O2 saturation, blood pressure, pulse and respirations were  monitored throughout the entire procedure.   CONSCIOUS SEDATION:  Versed 4 mg IV, Demerol 100 mg IV, SBE prophylaxis  ampicillin 2 g IV, gentamycin 85 mg IV, Cetacaine spray for topical  oropharyngeal anesthesia.   FINDINGS:  Examination of the tubal esophagus revealed Schatzki's ring,  esophageal mucosa otherwise appeared normal. The EG junction was easily  traversed.   STOMACH:  The gastric cavity was empty aside from some bile stained mucus.  Thorough examination of the gastric mucosa including retroflexed view of the  proximal stomach and esophagogastric junction was undertaken and gastric  cavity insufflated well with air. The gastric mucosa appeared normal,  pylorus patent, easily traversed. Examination of the bulb and second portion  revealed a 1 cm bulbar ulcer straddling the junction between D1 and D2, it  was friable but there was no active bleeding. Please see photos. The  remainder of D1 and D2 appeared normal.   THERAPEUTIC/DIAGNOSTIC MANEUVERS PERFORMED:  A 56 French  Maloney dilator was  passed to full insertion, look back revealed no apparent complications  related to passage of the dilator. The patient tolerated the procedure well  and was reacted in endoscopy.   IMPRESSION:  1.  Schatzki's otherwise normal esophagus, status post dilation as described      above.  2.  Small hiatal hernia otherwise normal stomach. 1 cm bulbar ulcer      straddling D1 and D2. The remainder of D1 and D2 otherwise appear      normal.   RECOMMENDATIONS:  1.  The patient is to refrain from taking Mobic in any form of nonsteroidal      from here on out. Continue on taking      AcipHex 20 mg orally daily.  2.  Further recommendations to follow.  3.  Followup appointment with [place] in six weeks.       RMR/MEDQ  D:  06/12/2004  T:  06/12/2004  Job:  [id number]   cc:   Ladona Horns. Neijstrom, MD  [address]  [place]  [place] [address]  Fax: [phone]

## 2010-05-24 NOTE — H&P (Signed)
NAME:  Catherine Mccullough, Catherine Mccullough                        ACCOUNT NO.:  [id number]   MEDICAL RECORD NO.:  [id number]                   PATIENT TYPE:  AMB   LOCATION:  DAY                                  FACILITY:  [facility]   PHYSICIAN:  Jerolyn Shin C. Katrinka Blazing, M.D.                DATE OF BIRTH:  01-Apr-1945   DATE OF ADMISSION:  DATE OF DISCHARGE:                                HISTORY & PHYSICAL   HISTORY OF PRESENT ILLNESS:  [age]-year-old female scheduled for  colonoscopy because of strongly guaiac positive stool.  The patient has not  had abdominal pain, nausea, vomiting or diarrhea.  She does have a history  of chronic anemia.  She is scheduled for colonoscopy for screening.   PAST HISTORY:  The has history of:  1. Metastatic breast cancer status post right mastectomy with follow up     chemotherapy and radiation therapy.  2. 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  3. Diet-controlled diabetes mellitus.  4. Hypertension.  5. Gastritis.  6. History of seizure disorder.  7. Chronic depression.   ALLERGIES:  CODEINE, DILANTIN, SULFONAMIDE and IVP DYE.   MEDICATIONS:  1. Mobic 15 mg daily.  2. Lotrel 5.10 daily.  3. Vicodin ES q.i.d.  4. Allegra 90 mg daily.  5. Tamoxifen 10 mg b.i.d.  6. Methergine 0.125 mg q.i.d.  7. Dilaudid 4 mg q.8 hours p.r.n.  8. Hydrochlorothiazide 50 mg daily.  9. Kay-Ciel 20 mEq daily.   PHYSICAL EXAMINATION:  VITAL SIGNS:  Blood pressure 126/70, pulse 100,  respirations 18 and weight 146 pounds.  HEENT:  Unremarkable, except for partial dentures.  NECK:  Neck is supple with no JVD or bruit.  CHEST:  Chest is clear to auscultation.  HEART:  Regular rate and rhythm without murmur, gallop or rub.  BREASTS:  Well-healed right chest wall status post mastectomy with no local  recurrence; and, normal left breast without masses.  ABDOMEN:  Abdomen is soft and nontender.  No masses.  RECTAL:  Normal tone.  No masses.  Strongly  guaiac positive stool.  EXTREMITIES:  No cyanosis, clubbing or edema.  NEUROLOGIC EXAMINATION:  No focal motor, sensory or cerebellar deficits.  PELVIC EXAMINATION:  Pelvic is normal.  GENITALIA:  External genitalia; normal vaginal vault.  No uterus cervix or  adnexa noted.   IMPRESSION:  1. Guaiac positive stool.  2. Chronic anemia.  3. History of breast cancer.  4. Hypertension.  5. Chronic low-back pain.  6. Irritable bowel syndrome.  7. Noninsulin-dependent diabetes mellitus, diet-controlled.  8. Fibromyalgia.   PLAN:  Screening colonoscopy.     ___________________________________________                                         Dirk Dress. Katrinka Blazing, M.D.  LCS/MEDQ  D:  02/15/2003  T:  02/16/2003  Job:  [id number]

## 2010-09-27 LAB — BASIC METABOLIC PANEL
CO2: 27
Calcium: 9.7
GFR calc Af Amer: 54 — ABNORMAL LOW
Potassium: 3.6

## 2010-09-27 LAB — CBC
HCT: 30.4 — ABNORMAL LOW
MCHC: 34.9
Platelets: 208
RBC: 3.24 — ABNORMAL LOW

## 2010-12-04 ENCOUNTER — Emergency Department (HOSPITAL_COMMUNITY): Payer: PRIVATE HEALTH INSURANCE

## 2010-12-04 ENCOUNTER — Emergency Department (HOSPITAL_COMMUNITY)
Admission: EM | Admit: 2010-12-04 | Discharge: 2010-12-04 | Disposition: A | Discharge status: Home / Self Care | Payer: PRIVATE HEALTH INSURANCE | Attending: Emergency Medicine | Admitting: Emergency Medicine

## 2010-12-04 ENCOUNTER — Encounter: Payer: Self-pay | Admitting: *Deleted

## 2010-12-04 DIAGNOSIS — Z853 Personal history of malignant neoplasm of breast: Secondary | ICD-10-CM | POA: Insufficient documentation

## 2010-12-04 DIAGNOSIS — R601 Generalized edema: Secondary | ICD-10-CM

## 2010-12-04 DIAGNOSIS — I509 Heart failure, unspecified: Secondary | ICD-10-CM | POA: Insufficient documentation

## 2010-12-04 DIAGNOSIS — E119 Type 2 diabetes mellitus without complications: Secondary | ICD-10-CM | POA: Insufficient documentation

## 2010-12-04 DIAGNOSIS — Z9010 Acquired absence of unspecified breast and nipple: Secondary | ICD-10-CM | POA: Insufficient documentation

## 2010-12-04 DIAGNOSIS — M129 Arthropathy, unspecified: Secondary | ICD-10-CM | POA: Insufficient documentation

## 2010-12-04 DIAGNOSIS — IMO0002 Reserved for concepts with insufficient information to code with codable children: Secondary | ICD-10-CM | POA: Insufficient documentation

## 2010-12-04 DIAGNOSIS — Z9079 Acquired absence of other genital organ(s): Secondary | ICD-10-CM | POA: Insufficient documentation

## 2010-12-04 DIAGNOSIS — Z96659 Presence of unspecified artificial knee joint: Secondary | ICD-10-CM | POA: Insufficient documentation

## 2010-12-04 DIAGNOSIS — F411 Generalized anxiety disorder: Secondary | ICD-10-CM | POA: Insufficient documentation

## 2010-12-04 DIAGNOSIS — L0390 Cellulitis, unspecified: Secondary | ICD-10-CM

## 2010-12-04 DIAGNOSIS — W19XXXA Unspecified fall, initial encounter: Secondary | ICD-10-CM | POA: Insufficient documentation

## 2010-12-04 DIAGNOSIS — R609 Edema, unspecified: Secondary | ICD-10-CM | POA: Insufficient documentation

## 2010-12-04 DIAGNOSIS — I10 Essential (primary) hypertension: Secondary | ICD-10-CM | POA: Insufficient documentation

## 2010-12-04 DIAGNOSIS — R109 Unspecified abdominal pain: Secondary | ICD-10-CM | POA: Insufficient documentation

## 2010-12-04 DIAGNOSIS — M79609 Pain in unspecified limb: Secondary | ICD-10-CM | POA: Insufficient documentation

## 2010-12-04 HISTORY — DX: Unspecified osteoarthritis, unspecified site: M19.90

## 2010-12-04 HISTORY — DX: Heart failure, unspecified: I50.9

## 2010-12-04 HISTORY — DX: Essential (primary) hypertension: I10

## 2010-12-04 HISTORY — DX: Anxiety disorder, unspecified: F41.9

## 2010-12-04 HISTORY — DX: Disorder of kidney and ureter, unspecified: N28.9

## 2010-12-04 HISTORY — DX: Malignant (primary) neoplasm, unspecified: C80.1

## 2010-12-04 HISTORY — DX: Lymphedema, not elsewhere classified: I89.0

## 2010-12-04 LAB — DIFFERENTIAL
Basophils Absolute: 0 10*3/uL (ref 0.0–0.1)
Eosinophils Absolute: 0.2 10*3/uL (ref 0.0–0.7)
Lymphocytes Relative: 26 % (ref 12–46)
Monocytes Relative: 7 % (ref 3–12)

## 2010-12-04 LAB — URINALYSIS, ROUTINE W REFLEX MICROSCOPIC
Glucose, UA: NEGATIVE mg/dL
Protein, ur: NEGATIVE mg/dL
Specific Gravity, Urine: 1.015 (ref 1.005–1.030)
pH: 5.5 (ref 5.0–8.0)

## 2010-12-04 LAB — CBC
HCT: 28.6 % — ABNORMAL LOW (ref 36.0–46.0)
MCH: 32 pg (ref 26.0–34.0)
Platelets: 188 10*3/uL (ref 150–400)
RDW: 16.5 % — ABNORMAL HIGH (ref 11.5–15.5)

## 2010-12-04 LAB — BASIC METABOLIC PANEL
CO2: 28 mEq/L (ref 19–32)
Sodium: 136 mEq/L (ref 135–145)

## 2010-12-04 MED ORDER — HEPARIN SOD (PORK) LOCK FLUSH 100 UNIT/ML IV SOLN
INTRAVENOUS | Status: AC
  Administered 2010-12-04: 18:00:00
  Filled 2010-12-04: qty 5

## 2010-12-04 MED ORDER — HYDROMORPHONE HCL 2 MG PO TABS: 2.0000 mg | ORAL_TABLET | ORAL | Status: AC | PRN

## 2010-12-04 MED ORDER — MORPHINE SULFATE 4 MG/ML IJ SOLN
4.0000 mg | Freq: Once | INTRAMUSCULAR | Status: AC
  Administered 2010-12-04: 4 mg via INTRAVENOUS
  Filled 2010-12-04: qty 1

## 2010-12-04 MED ORDER — DOXYCYCLINE HYCLATE 100 MG PO CAPS: 100.0000 mg | ORAL_CAPSULE | Freq: Two times a day (BID) | ORAL | Status: AC

## 2010-12-04 NOTE — ED Notes (Signed)
Pt reports a fall about a week ago.  Pt states that she hit her head and "may have lost consciousness".  Pt also reports hitting her left hip and leg.  Pt reports pain to those areas.  Pt also reports a reddened area to her rt arm.

## 2010-12-04 NOTE — ED Provider Notes (Signed)
History     CSN: [id number] Arrival date & time: 12/04/2010  1:27 PM   First MD Initiated Contact with Patient 12/04/10 1500      Chief Complaint  Patient presents with  . Leg Pain  . Cellulitis    (Consider location/radiation/quality/duration/timing/severity/associated sxs/prior treatment) Patient is a 65 y.o. female presenting with leg pain. The history is provided by the patient.  Leg Pain  Pertinent negatives include no numbness.   patient states that she's had some pain and swelling in her right upper extremity the last 2 days. States she saw her primary care Dr. and was told to come to the ER for evaluation. She has previous history of mastectomy for cancer along with lymph node dissection. No fevers. No numbness weakness. No trauma. No trouble breathing.  She also states she fell last week Friday. Then she's had pain in her left flank going down to her left leg. No numbness or weakness. The lightheadedness dizziness. No bleeding. Vomiting. She states she hit her head, no headaches now.   Past Medical History  Diagnosis Date  . Kidney disease   . Hypertension   . Diabetes mellitus   . 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 Arthritis   . CHF (congestive heart failure)   . Lymphedema of leg   . Anxiety     Past Surgical History  Procedure Date  . Breast surgery   . Cholecystectomy   . Abdominal hysterectomy   . Replacement total knee bilateral   . Hand tendon surgery     History reviewed. No pertinent family history.  History  Substance Use Topics  . Smoking status: Never Smoker   . Smokeless tobacco: Not on file  . Alcohol Use: No    OB History    Grav Para Term Preterm Abortions TAB SAB Ect Mult Living                  Review of Systems  Constitutional: Negative for activity change, appetite change and fatigue.  HENT: Negative for ear discharge.   Eyes: Negative for pain.  Respiratory: Negative for cough.   Cardiovascular: Negative for chest pain.    Gastrointestinal: Positive for abdominal pain. Negative for nausea and vomiting.  Genitourinary: Positive for flank pain.  Musculoskeletal: Negative for myalgias and back pain.  Neurological: Negative for weakness and numbness.    Allergies  Aspirin; Contrast media; Other; and Ultram  Home Medications   Current Outpatient Rx  Name Route Sig Dispense Refill  . AMLODIPINE BESYLATE 10 MG PO TABS Oral Take 10 mg by mouth daily.      Marland Kitchen CITALOPRAM HYDROBROMIDE 40 MG PO TABS Oral Take 40 mg by mouth daily.      Marland Kitchen ESOMEPRAZOLE MAGNESIUM 40 MG PO CPDR Oral Take 40 mg by mouth daily before breakfast.      . FENTANYL 25 MCG/HR TD PT72 Transdermal Place 1 patch onto the skin every 3 (three) days. For pain. **Remove used patches and apply a new patch for each application**     . FEXOFENADINE-PSEUDOEPHEDRINE 180-240 MG PO TB24 Oral Take 1 tablet by mouth daily as needed. allergies     . FUROSEMIDE 40 MG PO TABS Oral Take 40 mg by mouth 2 (two) times daily.      Marland Kitchen GABAPENTIN 300 MG PO CAPS Oral Take 300 mg by mouth 3 (three) times daily.      Marland Kitchen HYDROMORPHONE HCL 2 MG PO TABS Oral Take 2-4 mg  by mouth every 4 (four) hours as needed. For pain    . LABETALOL HCL 100 MG PO TABS Oral Take 100 mg by mouth 2 (two) times daily.      Marland Kitchen MONTELUKAST SODIUM 10 MG PO TABS Oral Take 10 mg by mouth at bedtime.      Carma Leaven M PLUS PO TABS Oral Take 1 tablet by mouth daily.      Marland Kitchen HYDROMORPHONE HCL 2 MG PO TABS Oral Take 1 tablet (2 mg total) by mouth every 4 (four) hours as needed for pain. 10 tablet 0    BP 154/65  Pulse 89  Temp(Src) 98.2 F (36.8 C) (Oral)  Resp 19  Ht 4\' 8"  (1.422 m)  Wt 150 lb (68.04 kg)  BMI 33.63 kg/m2  SpO2 96%  Physical Exam  Nursing note and vitals reviewed. Constitutional: She is oriented to person, place, and time. She appears well-developed and well-nourished.  HENT:  Head: Normocephalic and atraumatic.  Eyes: EOM are normal. Pupils are equal, round, and reactive to light.   Neck: Normal range of motion. Neck supple.  Cardiovascular: Normal rate, regular rhythm and normal heart sounds.   No murmur heard. Pulmonary/Chest: Effort normal and breath sounds normal. No respiratory distress. She has no wheezes. She has no rales.  Abdominal: Soft. Bowel sounds are normal. She exhibits no distension. There is tenderness. There is no rebound and no guarding.       Tender left side of abdomen. Tender left CVA area. No rebound or guarding. Bruising. No mass.  Musculoskeletal: Normal range of motion. She exhibits edema.       Bilateral lymphedema of upper extremities. Right upper extremity from the forearm medially at work his way up the arm is redness and some induration. No fluctuance. No drainage.  Neurological: She is alert and oriented to person, place, and time. No cranial nerve deficit.  Skin: Skin is warm and dry.  Psychiatric: She has a normal mood and affect. Her speech is normal.    ED Course  Procedures (including critical care time)  Labs Reviewed  CBC - Abnormal; Notable for the following:    RBC 2.97 (*)    Hemoglobin 9.5 (*)    HCT 28.6 (*)    RDW 16.5 (*)    All other components within normal limits  BASIC METABOLIC PANEL - Abnormal; Notable for the following:    Creatinine, Ser 1.21 (*)    GFR calc non Af Amer 46 (*)    GFR calc Af Amer 54 (*)    All other components within normal limits  DIFFERENTIAL  URINALYSIS, ROUTINE W REFLEX MICROSCOPIC   Ct Abdomen Pelvis Wo Contrast  12/04/2010  *RADIOLOGY REPORT*  Clinical Data:  Left flank pain and fall.  CT ABDOMEN AND PELVIS WITHOUT CONTRAST  Technique:  Multidetector CT imaging of the abdomen and pelvis was performed following the standard protocol without intravenous contrast.  Comparison:  02/14/2010  Findings: There are bilateral pleural effusions, right side greater than left.  No evidence for free air.  There is a small amount of perihepatic ascites.  There is diffuse subcutaneous edema.  Small  amount of pericardial fluid is present.  There is free fluid in the pelvis.  No gross abnormality to the liver or spleen. Gallbladder has been removed.  A few calcifications in the pancreatic head may represent previous inflammation.  Limited evaluation of the pancreatic head.  There appears be fluid or edema in the porta hepatis.  There is  mild edema or stranding in the perirenal regions bilaterally.  Mild fullness of the left adrenal gland may be related to adjacent edema.  No evidence for kidney stones or hydronephrosis.  Uterus has been removed.  Again noted is sclerosis along the left side of the L2 vertebral body and left L2 pedicle. There is increased sclerosis involving the inferior endplate of L2 and superior endplate of L3.  Multilevel degenerative changes in the lower lumbar spine.  IMPRESSION: Bilateral pleural effusions with abdominal ascites and subcutaneous edema.  Findings suggest an edematous state or anasarca.  Sclerotic lesion involving the L2 vertebral body with increased sclerosis at L2-L3.  Findings probably related to known metastatic bone disease.  This may represent a treated lesion.  Increased sclerosis at L2-3 could represent increased metastatic disease versus secondary degenerative changes.  Original Report Authenticated By: Richarda Overlie, M.D.   US Venous Img Upper Uni Right  12/04/2010  *RADIOLOGY REPORT*  Clinical Data:  65 year old female with right upper extremity pain and swelling.  RIGHT UPPER EXTREMITY VENOUS DUPLEX ULTRASOUND  Technique:  Gray-scale sonography with graded compression, as well as color Doppler and duplex ultrasound were performed to evaluate the upper extremity deep venous system from the level of the subclavian vein and including the jugular, axillary, basilic and upper cephalic vein.  Spectral Doppler was utilized to evaluate flow at rest and with distal augmentation maneuvers.  Comparison:  None.  Findings:  Normal compressibility of the upper extremity deep  veins is demonstrated.  No venous filling defects visualized on grayscale or color Doppler US.  Normal direction of flow is seen throughout the deep veins.  Spectral Doppler waveforms show normal morphology at rest and with distal augmentation.  IMPRESSION: No evidence of right upper extremity deep venous thrombosis.  Original Report Authenticated By: Rosendo Gros, M.D.     1. Cellulitis   2. Anasarca   3. Flank pain       MDM  Patient has pain and redness in her right forearm. Appears to be a cellulitis on top of anasarca. She was started with antibiotics. Doppler did not show a DVT. She also has flank pain after a fall. Negative CAT scan for trauma. The L2 lesion is old, but patient was informed changes. She states she followup tomorrow.        Juliet Rude. Rubin Payor, MD 12/04/10 1610

## 2010-12-04 NOTE — ED Notes (Signed)
Pt states she was seen by her md yesterday and told to come to ed d/t red area to right arm. Pt states she also fell last week and is having pain to her left leg.

## 2011-01-11 ENCOUNTER — Emergency Department (HOSPITAL_COMMUNITY): Payer: PRIVATE HEALTH INSURANCE

## 2011-01-11 ENCOUNTER — Encounter (HOSPITAL_COMMUNITY): Payer: Self-pay | Admitting: *Deleted

## 2011-01-11 ENCOUNTER — Emergency Department (HOSPITAL_COMMUNITY)
Admission: EM | Admit: 2011-01-11 | Discharge: 2011-01-12 | Disposition: A | Discharge status: Home / Self Care | Payer: PRIVATE HEALTH INSURANCE | Attending: Emergency Medicine | Admitting: Emergency Medicine

## 2011-01-11 DIAGNOSIS — R609 Edema, unspecified: Secondary | ICD-10-CM | POA: Insufficient documentation

## 2011-01-11 DIAGNOSIS — R944 Abnormal results of kidney function studies: Secondary | ICD-10-CM | POA: Insufficient documentation

## 2011-01-11 DIAGNOSIS — R0602 Shortness of breath: Secondary | ICD-10-CM | POA: Insufficient documentation

## 2011-01-11 DIAGNOSIS — R011 Cardiac murmur, unspecified: Secondary | ICD-10-CM | POA: Insufficient documentation

## 2011-01-11 DIAGNOSIS — E119 Type 2 diabetes mellitus without complications: Secondary | ICD-10-CM | POA: Insufficient documentation

## 2011-01-11 DIAGNOSIS — Z853 Personal history of malignant neoplasm of breast: Secondary | ICD-10-CM | POA: Insufficient documentation

## 2011-01-11 DIAGNOSIS — D72829 Elevated white blood cell count, unspecified: Secondary | ICD-10-CM | POA: Insufficient documentation

## 2011-01-11 DIAGNOSIS — I509 Heart failure, unspecified: Secondary | ICD-10-CM | POA: Insufficient documentation

## 2011-01-11 DIAGNOSIS — F411 Generalized anxiety disorder: Secondary | ICD-10-CM | POA: Insufficient documentation

## 2011-01-11 DIAGNOSIS — R079 Chest pain, unspecified: Secondary | ICD-10-CM | POA: Insufficient documentation

## 2011-01-11 DIAGNOSIS — Z96659 Presence of unspecified artificial knee joint: Secondary | ICD-10-CM | POA: Insufficient documentation

## 2011-01-11 DIAGNOSIS — Y92009 Unspecified place in unspecified non-institutional (private) residence as the place of occurrence of the external cause: Secondary | ICD-10-CM | POA: Insufficient documentation

## 2011-01-11 DIAGNOSIS — N390 Urinary tract infection, site not specified: Secondary | ICD-10-CM | POA: Insufficient documentation

## 2011-01-11 DIAGNOSIS — M129 Arthropathy, unspecified: Secondary | ICD-10-CM | POA: Insufficient documentation

## 2011-01-11 DIAGNOSIS — W08XXXA Fall from other furniture, initial encounter: Secondary | ICD-10-CM | POA: Insufficient documentation

## 2011-01-11 DIAGNOSIS — Z9079 Acquired absence of other genital organ(s): Secondary | ICD-10-CM | POA: Insufficient documentation

## 2011-01-11 DIAGNOSIS — I10 Essential (primary) hypertension: Secondary | ICD-10-CM | POA: Insufficient documentation

## 2011-01-11 DIAGNOSIS — W19XXXA Unspecified fall, initial encounter: Secondary | ICD-10-CM

## 2011-01-11 DIAGNOSIS — IMO0001 Reserved for inherently not codable concepts without codable children: Secondary | ICD-10-CM | POA: Insufficient documentation

## 2011-01-11 LAB — URINALYSIS, ROUTINE W REFLEX MICROSCOPIC
Bilirubin Urine: NEGATIVE
Glucose, UA: NEGATIVE mg/dL
Nitrite: NEGATIVE
Specific Gravity, Urine: 1.015 (ref 1.005–1.030)
pH: 5.5 (ref 5.0–8.0)

## 2011-01-11 LAB — COMPREHENSIVE METABOLIC PANEL
ALT: 21 U/L (ref 0–35)
AST: 29 U/L (ref 0–37)
Alkaline Phosphatase: 107 U/L (ref 39–117)
GFR calc Af Amer: 17 mL/min — ABNORMAL LOW (ref >90)
Glucose, Bld: 83 mg/dL (ref 70–99)
Potassium: 4.4 mEq/L (ref 3.5–5.1)
Sodium: 130 mEq/L — ABNORMAL LOW (ref 135–145)
Total Protein: 7.6 g/dL (ref 6.0–8.3)

## 2011-01-11 LAB — CBC
HCT: 28.5 % — ABNORMAL LOW (ref 36.0–46.0)
Hemoglobin: 9.5 g/dL — ABNORMAL LOW (ref 12.0–15.0)
RDW: 15.5 % (ref 11.5–15.5)
WBC: 18.2 10*3/uL — ABNORMAL HIGH (ref 4.0–10.5)

## 2011-01-11 LAB — CK: Total CK: 268 U/L — ABNORMAL HIGH (ref 7–177)

## 2011-01-11 LAB — GLUCOSE, CAPILLARY: Glucose-Capillary: 67 mg/dL — ABNORMAL LOW (ref 70–99)

## 2011-01-11 LAB — URINE MICROSCOPIC-ADD ON

## 2011-01-11 MED ORDER — DEXTROSE 50 % IV SOLN
INTRAVENOUS | Status: AC
  Filled 2011-01-11: qty 50

## 2011-01-11 MED ORDER — HEPARIN SOD (PORK) LOCK FLUSH 100 UNIT/ML IV SOLN
INTRAVENOUS | Status: AC
  Administered 2011-01-11: 500 [IU]
  Filled 2011-01-11: qty 5

## 2011-01-11 MED ORDER — DEXTROSE 50 % IV SOLN: 50.0000 mL | Freq: Once | INTRAVENOUS | Status: DC

## 2011-01-11 MED ORDER — SODIUM CHLORIDE 0.9 % IV BOLUS (SEPSIS): 1000.0000 mL | Freq: Once | INTRAVENOUS | Status: DC

## 2011-01-11 NOTE — ED Notes (Signed)
Verbal order received from Dr. Colon Branch to hold D50. Pt's CBG 80.

## 2011-01-11 NOTE — ED Notes (Signed)
Pt stable at discharge with no complaints. 

## 2011-01-11 NOTE — ED Provider Notes (Signed)
This chart was scribed for [employer]. Colon Branch, MD by Williemae Natter. The patient was seen in room [facility].  CSN: [id number]  Arrival date & time 01/11/11  1859   First MD Initiated Contact with Patient 01/11/11 1913      Chief Complaint  Patient presents with  . Fall    (Consider location/radiation/quality/duration/timing/severity/associated sxs/prior treatment) HPI Catherine Mccullough is a 66 y.o. female with a history of breast cancer who presents to the Emergency Department complaining of a fall. Pt fell off her sofa around 5PM yesterday and was unable to get up. Her family found her today and had her brought in via EMS. She states that she hurts all over. Pt's first diagnosis of breast cancer was in 1988 with relapses in 1999 and 2009. PT has had bilateral mastectomy. She also has a history of chronic brawny edema which she is now treating with a fluid pill. Pt also has a history of coronary artery disease and a heart murmur. She also had a history of renal failure which she received dialysis treatment for.   PCP Dr. Lesly Dukes Past Medical History  Diagnosis Date  . Kidney disease   . Hypertension   . Diabetes mellitus   . 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 Arthritis   . CHF (congestive heart failure)   . Lymphedema of leg   . Anxiety     Past Surgical History  Procedure Date  . Breast surgery   . Cholecystectomy   . Abdominal hysterectomy   . Replacement total knee bilateral   . Hand tendon surgery     No family history on file.  History  Substance Use Topics  . Smoking status: Never Smoker   . Smokeless tobacco: Not on file  . Alcohol Use: No    OB History    Grav Para Term Preterm Abortions TAB SAB Ect Mult Living                  Review of Systems 10 Systems reviewed and are negative for acute change except as noted in the HPI.  Allergies  Aspirin; Contrast media; Other; and Ultram  Home Medications   Current Outpatient Rx  Name Route Sig Dispense  Refill  . AMLODIPINE BESYLATE 10 MG PO TABS Oral Take 10 mg by mouth daily.      Marland Kitchen CITALOPRAM HYDROBROMIDE 40 MG PO TABS Oral Take 40 mg by mouth daily.      Marland Kitchen ESOMEPRAZOLE MAGNESIUM 40 MG PO CPDR Oral Take 40 mg by mouth daily before breakfast.      . FENTANYL 25 MCG/HR TD PT72 Transdermal Place 1 patch onto the skin every 3 (three) days. For pain. **Remove used patches and apply a new patch for each application**     . FEXOFENADINE-PSEUDOEPHED ER 180-240 MG PO TB24 Oral Take 1 tablet by mouth daily as needed. allergies     . FUROSEMIDE 40 MG PO TABS Oral Take 40 mg by mouth 2 (two) times daily.      Marland Kitchen GABAPENTIN 300 MG PO CAPS Oral Take 300 mg by mouth 3 (three) times daily.      Marland Kitchen HYDROMORPHONE HCL 2 MG PO TABS Oral Take 2-4 mg by mouth every 4 (four) hours as needed. For pain    . LABETALOL HCL 100 MG PO TABS Oral Take 100 mg by mouth 2 (two) times daily.      Marland Kitchen MONTELUKAST SODIUM 10 MG PO TABS Oral Take  10 mg by mouth at bedtime.      Carma Leaven M PLUS PO TABS Oral Take 1 tablet by mouth daily.        BP 146/53  Pulse 95  Temp(Src) 99 F (37.2 C) (Oral)  Resp 20  Ht 4\' 8"  (1.422 m)  Wt 150 lb (68.04 kg)  BMI 33.63 kg/m2  SpO2 92%  Physical Exam  Nursing note and vitals reviewed. Constitutional: She is oriented to person, place, and time. She appears well-developed and well-nourished. No distress.  HENT:  Head: Normocephalic and atraumatic.       Edentulous Mucous membranes very dry   Neck: Normal range of motion. Neck supple.  Cardiovascular: Normal rate and regular rhythm.   Murmur (holosystolic murmur) heard.      Exaggerated upper extremity pulses  Musculoskeletal: She exhibits edema (4+ brawny edema in upper extremities).       3+ edema of left lower leg with brawny skin changes 2+ edema of right lower leg  Neurological: She is alert and oriented to person, place, and time.  Skin: Skin is warm and dry.  Psychiatric: She has a normal mood and affect. Her behavior is  normal.    ED Course  Procedures (including critical care time) Results for orders placed during the hospital encounter of 01/11/11  GLUCOSE, CAPILLARY      Component Value Range   Glucose-Capillary 67 (*) 70 - 99 (mg/dL)  CBC      Component Value Range   WBC 18.2 (*) 4.0 - 10.5 (K/uL)   RBC 2.81 (*) 3.87 - 5.11 (MIL/uL)   Hemoglobin 9.5 (*) 12.0 - 15.0 (g/dL)   HCT 16.1 (*) 09.6 - 46.0 (%)   MCV 101.4 (*) 78.0 - 100.0 (fL)   MCH 33.8  26.0 - 34.0 (pg)   MCHC 33.3  30.0 - 36.0 (g/dL)   RDW 04.5  40.9 - 81.1 (%)   Platelets 200  150 - 400 (K/uL)  CK      Component Value Range   Total CK 268 (*) 7 - 177 (U/L)  COMPREHENSIVE METABOLIC PANEL      Component Value Range   Sodium 130 (*) 135 - 145 (mEq/L)   Potassium 4.4  3.5 - 5.1 (mEq/L)   Chloride 91 (*) 96 - 112 (mEq/L)   CO2 22  19 - 32 (mEq/L)   Glucose, Bld 83  70 - 99 (mg/dL)   BUN 61 (*) 6 - 23 (mg/dL)   Creatinine, Ser 9.14 (*) 0.50 - 1.10 (mg/dL)   Calcium 9.5  8.4 - 78.2 (mg/dL)   Total Protein 7.6  6.0 - 8.3 (g/dL)   Albumin 3.6  3.5 - 5.2 (g/dL)   AST 29  0 - 37 (U/L)   ALT 21  0 - 35 (U/L)   Alkaline Phosphatase 107  39 - 117 (U/L)   Total Bilirubin 1.1  0.3 - 1.2 (mg/dL)   GFR calc non Af Amer 15 (*) >90 (mL/min)   GFR calc Af Amer 17 (*) >90 (mL/min)  URINALYSIS, ROUTINE W REFLEX MICROSCOPIC      Component Value Range   Color, Urine YELLOW  YELLOW    APPearance CLEAR  CLEAR    Specific Gravity, Urine 1.015  1.005 - 1.030    pH 5.5  5.0 - 8.0    Glucose, UA NEGATIVE  NEGATIVE (mg/dL)   Hgb urine dipstick SMALL (*) NEGATIVE    Bilirubin Urine NEGATIVE  NEGATIVE    Ketones, ur TRACE (*)  NEGATIVE (mg/dL)   Protein, ur 30 (*) NEGATIVE (mg/dL)   Urobilinogen, UA 0.2  0.0 - 1.0 (mg/dL)   Nitrite NEGATIVE  NEGATIVE    Leukocytes, UA NEGATIVE  NEGATIVE   GLUCOSE, CAPILLARY      Component Value Range   Glucose-Capillary 80  70 - 99 (mg/dL)  URINE MICROSCOPIC-ADD ON      Component Value Range   Squamous  Epithelial / LPF FEW (*) RARE    WBC, UA 7-10  <3 (WBC/hpf)   RBC / HPF 7-10  <3 (RBC/hpf)   Bacteria, UA MANY (*) RARE    Dg Chest Port 1 View  01/11/2011  *RADIOLOGY REPORT*  Clinical Data: Shortness of breath and chest pain.  History of breast cancer.  PORTABLE CHEST - 1 VIEW  Comparison: 02/15/2007 chest CT and 03/01/2003 chest radiograph  Findings: The patient is mildly rotated to the right. Fullness of the left perihilar region unchanged from the CT scout film. A right IJ Port-A-Cath is noted with tip overlying the cavoatrial junction. There is no evidence of focal airspace disease, pulmonary edema, pulmonary nodule/mass, pleural effusion, or pneumothorax. No acute bony abnormalities are identified.  IMPRESSION: No evidence of acute cardiopulmonary disease.  Original Report Authenticated By: Rosendo Gros, M.D.       MDM  Patient who slid off the couch last night at home and was unable to get up or call for help. She was found by a family member when they could not reach her by phone. Labs with a leukocytosis, urine with beginning UTI. Elevated total CK c./w being down for several hours. Given IVF, PO fluids, a meal, feeling better. Initiated antibiotic therapy. Encouraged patient and family to consider First Alert or similar monitoring device for safety. Pt feels improved after observation and/or treatment in ED.Pt stable in ED with no significant deterioration in condition.The patient appears reasonably screened and/or stabilized for discharge and I doubt any other medical condition or other [facility] requiring further screening, evaluation, or treatment in the ED at this time prior to discharge.   I personally performed the services described in this documentation, which was scribed in my presence. The recorded information has been reviewed and considered.  MDM Reviewed: nursing note and vitals Interpretation: labs and x-ray       Nicoletta Dress. Colon Branch, MD 01/11/11 2328

## 2011-01-11 NOTE — ED Notes (Signed)
Pt arrived via [facility] fully immobilized, pt reports she fell off of her sofa sometime yesterday and was unable to get up, pt reports her family came over today and found her then called 911, EMS reports they found her sitting on floor, pt now c/o "pain all over"

## 2011-01-11 NOTE — ED Notes (Signed)
Received report agree with previous assessment 

## 2011-01-11 NOTE — ED Notes (Signed)
MD at bedside. Pt removed from lsb and c-collar, EDP aware of pt's CBG

## 2011-01-12 ENCOUNTER — Emergency Department (HOSPITAL_COMMUNITY)
Admission: EM | Admit: 2011-01-12 | Discharge: 2011-01-12 | Disposition: A | Discharge status: Home / Self Care | Payer: PRIVATE HEALTH INSURANCE | Attending: Emergency Medicine | Admitting: Emergency Medicine

## 2011-01-12 ENCOUNTER — Encounter (HOSPITAL_COMMUNITY): Payer: Self-pay | Admitting: *Deleted

## 2011-01-12 DIAGNOSIS — W06XXXA Fall from bed, initial encounter: Secondary | ICD-10-CM | POA: Insufficient documentation

## 2011-01-12 DIAGNOSIS — I10 Essential (primary) hypertension: Secondary | ICD-10-CM | POA: Insufficient documentation

## 2011-01-12 DIAGNOSIS — Z79899 Other long term (current) drug therapy: Secondary | ICD-10-CM | POA: Insufficient documentation

## 2011-01-12 DIAGNOSIS — M129 Arthropathy, unspecified: Secondary | ICD-10-CM | POA: Insufficient documentation

## 2011-01-12 DIAGNOSIS — Y92009 Unspecified place in unspecified non-institutional (private) residence as the place of occurrence of the external cause: Secondary | ICD-10-CM | POA: Insufficient documentation

## 2011-01-12 DIAGNOSIS — F411 Generalized anxiety disorder: Secondary | ICD-10-CM | POA: Insufficient documentation

## 2011-01-12 DIAGNOSIS — N289 Disorder of kidney and ureter, unspecified: Secondary | ICD-10-CM | POA: Insufficient documentation

## 2011-01-12 DIAGNOSIS — IMO0001 Reserved for inherently not codable concepts without codable children: Secondary | ICD-10-CM | POA: Insufficient documentation

## 2011-01-12 DIAGNOSIS — E119 Type 2 diabetes mellitus without complications: Secondary | ICD-10-CM | POA: Insufficient documentation

## 2011-01-12 DIAGNOSIS — Z7982 Long term (current) use of aspirin: Secondary | ICD-10-CM | POA: Insufficient documentation

## 2011-01-12 LAB — GLUCOSE, CAPILLARY: Glucose-Capillary: 147 mg/dL — ABNORMAL HIGH (ref 70–99)

## 2011-01-12 NOTE — ED Notes (Signed)
Pt seen earlier tonight for same c/o fall, pt reports she lives with her elderly father she states he is in a "bad mood"

## 2011-01-12 NOTE — ED Notes (Signed)
Spoke with [employer] with DSS, report given and DSS worker will follow up with pt, pt reports she does not want to go into an assisted living facility, reports "I do pretty well", pt's father advised of situation

## 2011-01-12 NOTE — ED Notes (Signed)
Pt reports she fell off her bed tonight, pt now c/o all over pain

## 2011-01-12 NOTE — ED Notes (Signed)
Spoke with the answering service regarding DSS consult, awaiting return call

## 2011-01-12 NOTE — ED Provider Notes (Signed)
History     CSN: [id number]  Arrival date & time 01/12/11  0501   None     Chief Complaint  Patient presents with  . Fall    (Consider location/radiation/quality/duration/timing/severity/associated sxs/prior treatment) HPI This is a 66 year old black female who was seen several hours ago after having fallen at home. Her workup was negative and she was returned home. She called EMS this morning reporting that she fell off her bed and was unable to get back up. She is complaining of pain all over, which is a chronic condition attributed to fibromyalgia. She took one of her own Dilantin tablets prior to transport over here. She denies shortness of breath, nausea, vomiting, diarrhea or fever. She reportedly lives with her elderly father who was unable to help her when she falls. Review of systems is significant for pain in every body part asked.  Past Medical History  Diagnosis Date  . Kidney disease   . Hypertension   . Diabetes mellitus   . 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 Arthritis   . CHF (congestive heart failure)   . Lymphedema of leg   . Anxiety     Past Surgical History  Procedure Date  . Breast surgery   . Cholecystectomy   . Abdominal hysterectomy   . Replacement total knee bilateral   . Hand tendon surgery     No family history on file.  History  Substance Use Topics  . Smoking status: Never Smoker   . Smokeless tobacco: Not on file  . Alcohol Use: No    OB History    Grav Para Term Preterm Abortions TAB SAB Ect Mult Living                  Review of Systems  All other systems reviewed and are negative.    Allergies  Mometasone; Aspirin; Contrast media; Cortisone; Lyrica; Other; Prednisone; Sulfamethoxazole; Ultram; Codeine; Dilantin; and Zosyn  Home Medications   Current Outpatient Rx  Name Route Sig Dispense Refill  . AMLODIPINE BESYLATE 10 MG PO TABS Oral Take 10 mg by mouth daily.      . ASPIRIN EC 81 MG PO TBEC Oral Take 81 mg by mouth  daily.      Marland Kitchen CITALOPRAM HYDROBROMIDE 40 MG PO TABS Oral Take 40 mg by mouth daily.      Marland Kitchen ESOMEPRAZOLE MAGNESIUM 40 MG PO CPDR Oral Take 40 mg by mouth daily before breakfast.      . EXEMESTANE 25 MG PO TABS Oral Take 25 mg by mouth daily.      . FENTANYL 25 MCG/HR TD PT72 Transdermal Place 1 patch onto the skin every 3 (three) days. For pain. **Remove used patches and apply a new patch for each application**  Time for patch to be changed today 01/11/11...    . FEXOFENADINE HCL 180 MG PO TABS Oral Take 180 mg by mouth daily.      . FUROSEMIDE 40 MG PO TABS Oral Take 40 mg by mouth daily.     Marland Kitchen GABAPENTIN 300 MG PO CAPS Oral Take 300 mg by mouth 3 (three) times daily.      Marland Kitchen HYDROMORPHONE HCL 2 MG PO TABS Oral Take 2-4 mg by mouth every 4 (four) hours as needed. For pain    . LABETALOL HCL 100 MG PO TABS Oral Take 100 mg by mouth 2 (two) times daily.      Marland Kitchen MONTELUKAST SODIUM 10 MG PO  TABS Oral Take 10 mg by mouth at bedtime.      Carma Leaven M PLUS PO TABS Oral Take 1 tablet by mouth daily.      Marland Kitchen ONDANSETRON HCL 8 MG PO TABS Oral Take 8 mg by mouth every 8 (eight) hours as needed. nausea     . TRAZODONE HCL 100 MG PO TABS Oral Take 100 mg by mouth at bedtime.        BP 141/57  Pulse 101  Temp(Src) 100.4 F (38 C) (Oral)  Resp 18  Ht 4\' 8"  (1.422 m)  Wt 250 lb (113.399 kg)  BMI 56.05 kg/m2  SpO2 92%  Physical Exam General: Well-developed, well-nourished female in no acute distress; appearance consistent with age of record; uncomfortable appearing; patient complains of pain when touched on any part of her body, with no focality HENT: normocephalic, atraumatic Eyes: pupils equal round and reactive to light; extraocular muscles intact; arcus senilis bilaterally Neck: supple Heart: regular rate and rhythm; distant sound Lungs: clear to auscultation bilaterally Abdomen: soft; nondistended Extremities: no deformity; 1+ edema of all extremities Neurologic: Awake, alert; motor function intact  in all extremities and symmetric; no facial droop Skin: Warm and dry Psychiatric: Normal mood and affect; smiles, cooperative    ED Course  Procedures (including critical care time)  Labs Reviewed  GLUCOSE, CAPILLARY - Abnormal; Notable for the following:    Glucose-Capillary 147 (*)    All other components within normal limits     MDM  6:43 AM There is no evidence of acute injury on examination. History is also not consistent with the mechanism suspicious for a serious injury.  The Department of Social Services was consulted and will investigate the patient's living situation. She will be discharged home with a family member who would be advised of her concerns our plan is to have DSS investigate.          Hanley Seamen, MD 01/12/11 [phone]

## 2011-01-14 DIAGNOSIS — I517 Cardiomegaly: Secondary | ICD-10-CM

## 2011-05-29 ENCOUNTER — Ambulatory Visit (HOSPITAL_COMMUNITY)
Admission: RE | Admit: 2011-05-29 | Discharge: 2011-05-29 | Disposition: A | Discharge status: Home / Self Care | Payer: PRIVATE HEALTH INSURANCE | Source: Ambulatory Visit | Attending: Student | Admitting: Student

## 2011-05-29 DIAGNOSIS — M75101 Unspecified rotator cuff tear or rupture of right shoulder, not specified as traumatic: Secondary | ICD-10-CM

## 2011-05-29 DIAGNOSIS — M25519 Pain in unspecified shoulder: Secondary | ICD-10-CM | POA: Insufficient documentation

## 2011-05-29 DIAGNOSIS — M12811 Other specific arthropathies, not elsewhere classified, right shoulder: Secondary | ICD-10-CM | POA: Insufficient documentation

## 2011-05-29 DIAGNOSIS — IMO0001 Reserved for inherently not codable concepts without codable children: Secondary | ICD-10-CM | POA: Insufficient documentation

## 2011-05-29 DIAGNOSIS — M6281 Muscle weakness (generalized): Secondary | ICD-10-CM | POA: Insufficient documentation

## 2011-05-29 HISTORY — DX: Other specific arthropathies, not elsewhere classified, right shoulder: M12.811

## 2011-05-29 HISTORY — DX: Unspecified rotator cuff tear or rupture of right shoulder, not specified as traumatic: M75.101

## 2011-05-29 NOTE — Evaluation (Signed)
Occupational Therapy Evaluation  Patient Details  Name: Catherine Mccullough MRN: [id number] Date of Birth: July 14, 1945  Today's Date: 05/29/2011 Time: 1030-1115 OT Time Calculation (min): 45 min OT Evaluation 45' Visit#: 1  of 16   Re-eval: 06/26/11  Assessment Diagnosis: Right Shoulder Pain Prior Therapy: Home health for her right shoulder and left knee  Past Medical History:  Past Medical History  Diagnosis Date  . Kidney disease   . Hypertension   . Diabetes mellitus   . 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 Arthritis   . CHF (congestive heart failure)   . Lymphedema of leg   . Anxiety    Past Surgical History:  Past Surgical History  Procedure Date  . Breast surgery   . Cholecystectomy   . Abdominal hysterectomy   . Replacement total knee bilateral   . Hand tendon surgery     Subjective S:  Ever since they took this portacath out my right shoulder has been bothering me.  They really did a number on me. Pertinent History:  Per patient report her right shoulder was injured during the removal of her portacath in January of this year.  She had an open wound at the removal site and was treated at [facility].  After this incident she was recieving therapy from [facility] for her shoulder and her knee.  During this time, she fell across her bed and her ther knee and shoulder (right).  She has an extensive past medical history that includes breast cancer, double mastectomy, bone cancer, liver failure and dialysis, CHF, fibromyalgia, DM, HTN, GERD, rotator cuff tears, and arthropathy, and arthritis throughout her body.  She has been referred tot OT and PT for evaluation and treatment .  OT will address her shoulder pain and ADLs. Pain Assessment Currently in Pain?: Yes Pain Score:   4 Pain Location: Shoulder Pain Orientation: Right Pain Type: Chronic pain  Precautions/Restrictions  Precautions Precautions:  (has had cancer in the past, currently in remission  )  Prior Functioning  Home Living Lives With: Alone Type of Home: Apartment Home Access: Elevator Prior Function Level of Independence: Independent with basic ADLs (Requires increased time to complete BADLs, uses 4 wheel RW) Driving: No Vocation: Retired Leisure: Hobbies-yes (Comment) Comments: shopping, going to church  Assessment ADL/Vision/Perception ADL ADL Comments: Catherine Mccullough is having difficulty reaching overhead to close her blinds, reach into cabinets, bending down to pick items up off of the floor, writing, and reaching the computer keyboard. Dominant Hand: Right Vision - History Baseline Vision: Wears glasses all the time Perception Perception: Within Functional Limits Praxis Praxis: Intact  Cognition/Observation Observation/Other Assessments Observations: severe crepitis in her right shoulder and elbow joint.  Lymphedema present in BUE.  Healed surgical incision on right chest where portacath was removed.  Sensation/Coordination/Edema Sensation Light Touch: Appears Intact Coordination Gross Motor Movements are Fluid and Coordinated: Yes Fine Motor Movements are Fluid and Coordinated: Yes Edema Edema: Please refer to PT Evaluation  Additional Assessments RUE AROM (degrees) RUE Overall AROM Comments: assessed in supine.  ER and IR with shoulder adducted Right Shoulder Flexion  0-170: 40 Degrees (PROM 79) Right Shoulder ABduction 0-170: 45 Degrees (PROM 60) Right Shoulder Internal Rotation  0-90: 80 Degrees Right Shoulder External Rotation  0-90: 35 Degrees (PROM 65) Right Elbow Flexion 0-150: 146  Right Elbow Extension: 0  Palpation Palpation: Max scar restrictions along bilateral pectoral regions, max scar restrictions at portacath site, Max fascial restrictions in  right scapular region     Occupational Therapy Assessment and Plan OT Assessment and Plan Clinical Impression Statement: A:  Catherine Mccullough is a 66 year old female with multiple medical  problems.  Most recently, she has been experiencing incresed right shoulder pain and stiffness and decreased mobiilty and strength causing decreased I with her BADLs. Pt will benefit from skilled therapeutic intervention in order to improve on the following deficits: Cardiopulmonary status limiting activity;Decreased activity tolerance;Decreased range of motion;Decreased strength;Increased fascial restricitons;Increased muscle spasms;Impaired UE functional use;Pain Rehab Potential: Good OT Frequency: Min 2X/week OT Duration: 8 weeks OT Treatment/Interventions: Self-care/ADL training;Therapeutic exercise;Therapeutic activities;Manual therapy;Energy conservation;Patient/family education OT Plan: P:  Skilled OT intervention to decrease pain and restrictions and increase AROM and strength in her right shoulder for increased I with BADLs.  Treatment Plan:  Gentle manual stretching and massage to scapular region.  PROM in supine progress to AAROM and AROM as tolerated.  Seated  ball stretches, elevation, extension, retraction, table slide on wedge, pulleys .  Assess handwriting, keyboarding, and administer UEFI.   Goals Short Term Goals Time to Complete Short Term Goals: 4 weeks Short Term Goal 1: Patient will be educated on HEP. Short Term Goal 2: Patient will increase PROM of right shoulder to [facility] for increased I reaching overhead. Short Term Goal 3: Patient will increase right shoulder strength to 3+/5 for increased ability to pick up shoes off of the floor. Short Term Goal 4: Patient will decrease pain to 3/10 in her right shoulder when getting dressed. Short Term Goal 5: Patient will decrease fasical restrictions from max to mod in her right shoulder and chest. Additional Short Term Goals?: Yes Short Term Goal 6: Patient will decrease shoulder pain with handwriting by 50%. Long Term Goals Time to Complete Long Term Goals: 8 weeks Long Term Goal 1: Patient will complete BADLs and leisure activities  at highest level of I possible. Long Term Goal 2: Patient will increase right shoulder AROM to 90 flexion and abduction and 50 external rotation for increased ability to close her mini blinds and reach the computer keyboard. Long Term Goal 3: Patient will increase right shoulder strength to 4-/5 in given range for increased ability to lift plates of food into her microwave. Long Term Goal 4: Patient will decrease pain to 1-2/10 while completing daily activities. Long Term Goal 5: Patient will decrease fascial restrictions to min-mod in her right shoulder.  Problem List Patient Active Problem List  Diagnoses  . Pain in joint, shoulder region  . Rotator cuff tear arthropathy of right shoulder  . Muscle weakness (generalized)    End of Session Activity Tolerance: Patient tolerated treatment well General Behavior During Session: [facility] for tasks performed Cognition: [facility] for tasks performed OT Plan of Care OT Home Exercise Plan: Patient educated on towel slides for HEP.  Patient given written and verbal instruction.      Shirlean Mylar, OTR/L  05/29/2011, 12:03 PM  Physician Documentation Your signature is required to indicate approval of the treatment plan as stated above.  Please sign and either send electronically or make a copy of this report for your files and return this physician signed original.  Please mark one 1.__approve of plan  2. ___approve of plan with the following conditions.   ______________________________  _____________________ Physician Signature                                                                                                             Date

## 2011-06-04 ENCOUNTER — Ambulatory Visit (HOSPITAL_COMMUNITY): Payer: PRIVATE HEALTH INSURANCE | Admitting: Specialist

## 2011-06-05 ENCOUNTER — Ambulatory Visit (HOSPITAL_COMMUNITY)
Admission: RE | Admit: 2011-06-05 | Discharge: 2011-06-05 | Disposition: A | Discharge status: Home / Self Care | Payer: PRIVATE HEALTH INSURANCE | Source: Ambulatory Visit

## 2011-06-05 DIAGNOSIS — M12811 Other specific arthropathies, not elsewhere classified, right shoulder: Secondary | ICD-10-CM

## 2011-06-05 DIAGNOSIS — M75101 Unspecified rotator cuff tear or rupture of right shoulder, not specified as traumatic: Secondary | ICD-10-CM

## 2011-06-05 DIAGNOSIS — M25519 Pain in unspecified shoulder: Secondary | ICD-10-CM

## 2011-06-05 DIAGNOSIS — M6281 Muscle weakness (generalized): Secondary | ICD-10-CM

## 2011-06-05 NOTE — Progress Notes (Signed)
Occupational Therapy Treatment Patient Details  Name: Catherine Mccullough MRN: [id number] Date of Birth: [birth year]  Today's Date: 06/05/2011 Time: 7829-5621 OT Time Calculation (min): 50 min Manual Therapy 1035-1055 20' Therapeutic Exercises [phone] 19' Heat 6962-9528 10' Visit#: 2  of 16   Re-eval: 06/26/11     Subjective S:  I had some injections yesterday, and I fell, but I am ok. Pain Assessment Currently in Pain?: Yes Pain Score:   5 Pain Location: Shoulder Pain Orientation: Right Pain Type: Acute pain  Exercise/Treatments Supine Protraction: PROM;10 reps External Rotation: PROM;10 reps Internal Rotation: PROM;10 reps Flexion: PROM;10 reps Seated Elevation: AROM;10 reps Extension: AROM;10 reps Retraction: AROM;10 reps Row: AROM;10 reps Pulleys Flexion: 2 minutes ABduction: 2 minutes Therapy Ball Flexion: 15 reps ABduction: 15 reps    Modalities Modalities: Moist Heat Manual Therapy Manual Therapy: Massage Massage: Gentle massage and PROM to bilateral upper arms, scapular, and shoulder region to decrease pain and increase functional mobility. Moist Heat Therapy Number Minutes Moist Heat: 10 Minutes Moist Heat Location: Shoulder  Occupational Therapy Assessment and Plan OT Assessment and Plan Clinical Impression Statement: A:  PROM of right shoulder approximately 50% in supine.  Increased pain in right shoulder due to falling on her right side yesterday. OT Plan: P:  Add table slide on wedge, handwriting, keyboarding, administer UEFI.   Goals Short Term Goals Time to Complete Short Term Goals: 4 weeks Short Term Goal 1: Patient will be educated on HEP. Short Term Goal 1 Progress: Progressing toward goal Short Term Goal 2: Patient will increase PROM of right shoulder to [facility] for increased I reaching overhead. Short Term Goal 2 Progress: Progressing toward goal Short Term Goal 3: Patient will increase right shoulder strength to 3+/5 for increased ability  to pick up shoes off of the floor. Short Term Goal 3 Progress: Progressing toward goal Short Term Goal 4: Patient will decrease pain to 3/10 in her right shoulder when getting dressed. Short Term Goal 4 Progress: Progressing toward goal Short Term Goal 5: Patient will decrease fasical restrictions from max to mod in her right shoulder and chest. Short Term Goal 5 Progress: Progressing toward goal Additional Short Term Goals?: Yes Short Term Goal 6: Patient will decrease shoulder pain with handwriting by 50%. Short Term Goal 6 Progress: Progressing toward goal Long Term Goals Time to Complete Long Term Goals: 8 weeks Long Term Goal 1: Patient will complete BADLs and leisure activities at highest level of I possible. Long Term Goal 1 Progress: Progressing toward goal Long Term Goal 2: Patient will increase right shoulder AROM to 90 flexion and abduction and 50 external rotation for increased ability to close her mini blinds and reach the computer keyboard. Long Term Goal 2 Progress: Progressing toward goal Long Term Goal 3: Patient will increase right shoulder strength to 4-/5 in given range for increased ability to lift plates of food into her microwave. Long Term Goal 3 Progress: Progressing toward goal Long Term Goal 4: Patient will decrease pain to 1-2/10 while completing daily activities. Long Term Goal 4 Progress: Progressing toward goal Long Term Goal 5: Patient will decrease fascial restrictions to min-mod in her right shoulder. Long Term Goal 5 Progress: Progressing toward goal  Problem List Patient Active Problem List  Diagnoses  . Pain in joint, shoulder region  . Rotator cuff tear arthropathy of right shoulder  . Muscle weakness (generalized)    End of Session Activity Tolerance: Patient tolerated treatment well General Behavior During Session: [facility] for tasks  performed Cognition: [facility] for tasks performed  GO No functional reporting required  Shirlean Mylar,  OTR/L  06/05/2011, 12:07 PM

## 2011-06-09 ENCOUNTER — Ambulatory Visit (HOSPITAL_COMMUNITY): Payer: PRIVATE HEALTH INSURANCE | Admitting: Physical Therapy

## 2011-06-11 ENCOUNTER — Ambulatory Visit (HOSPITAL_COMMUNITY)
Admission: RE | Admit: 2011-06-11 | Discharge: 2011-06-11 | Disposition: A | Discharge status: Home / Self Care | Payer: PRIVATE HEALTH INSURANCE | Source: Ambulatory Visit | Attending: Student | Admitting: Student

## 2011-06-11 DIAGNOSIS — R262 Difficulty in walking, not elsewhere classified: Secondary | ICD-10-CM | POA: Insufficient documentation

## 2011-06-11 DIAGNOSIS — IMO0001 Reserved for inherently not codable concepts without codable children: Secondary | ICD-10-CM | POA: Insufficient documentation

## 2011-06-11 DIAGNOSIS — M6281 Muscle weakness (generalized): Secondary | ICD-10-CM | POA: Insufficient documentation

## 2011-06-11 DIAGNOSIS — R29898 Other symptoms and signs involving the musculoskeletal system: Secondary | ICD-10-CM | POA: Insufficient documentation

## 2011-06-11 DIAGNOSIS — M25519 Pain in unspecified shoulder: Secondary | ICD-10-CM | POA: Insufficient documentation

## 2011-06-11 NOTE — Progress Notes (Signed)
Occupational Therapy Treatment Patient Details  Name: Catherine Mccullough MRN: [id number] Date of Birth: 06-03-1945  Today's Date: 06/11/2011 Time: 3664-4034 OT Time Calculation (min): 52 min  Manual Therapy: 7425-9563 22' Therapeutic Exercise: 8756-4332 30' Visit#: 3  of 16   Re-eval: 06/26/11   Subjective Pain Assessment Currently in Pain?: Yes Pain Score:   7 Pain Location: Knee Pain Orientation: Left Pain Type: Chronic pain Pain Relieving Factors: Heat, pain meds  Precautions/Restrictions  Precautions:  (has had cancer in the past, currently in remission )  Exercise/Treatments Supine Protraction: PROM;10 reps External Rotation: PROM;10 reps Internal Rotation: PROM;10 reps Flexion: PROM;10 reps ABduction: PROM;10 reps Seated Elevation: AROM;12 reps Extension: AROM;12 reps Retraction: AROM;12 reps Row: AROM;12 reps Other Seated Exercises: Table slides on wedge (10)   Pulleys Flexion: 2 minutes ABduction: 2 minutes Therapy Ball Flexion: 15 reps ABduction: 15 reps  Hand Exercises Fine Motor Coordination:  (Handwriting with built up grip on writing utensil.Pt reporte)     Manual Therapy Manual Therapy: Myofascial release Massage: Gentle massage and PROM to bilateral scapular, trapezius, shoulder regions and R upper arm to decrease pain and increase pain free AROM.  Occupational Therapy Assessment and Plan OT Assessment and Plan Clinical Impression Statement: A: Pt reported pain decrease after manual therapy. VC required for proper positioning and to relax tense muscles during pulley exercise. Added table slides on wedge and handwriting training using built-up grip on pen. UEFI administered, pt scored 24/80 (30%).   OT Plan: Continue with today's added exercises as patient tolerated them well. Increase duration of pulleys. Add digiflex for increase ease with keyboarding.    Goals Short Term Goals Time to Complete Short Term Goals: 4 weeks Short Term Goal 1:  Patient will be educated on HEP. Short Term Goal 1 Progress: Progressing toward goal Short Term Goal 2: Patient will increase PROM of right shoulder to [facility] for increased I reaching overhead. Short Term Goal 2 Progress: Progressing toward goal Short Term Goal 3: Patient will increase right shoulder strength to 3+/5 for increased ability to pick up shoes off of the floor. Short Term Goal 3 Progress: Progressing toward goal Short Term Goal 4: Patient will decrease pain to 3/10 in her right shoulder when getting dressed. Short Term Goal 4 Progress: Progressing toward goal Short Term Goal 5: Patient will decrease fasical restrictions from max to mod in her right shoulder and chest. Short Term Goal 5 Progress: Progressing toward goal Additional Short Term Goals?: Yes Short Term Goal 6: Patient will decrease shoulder pain with handwriting by 50%. Short Term Goal 6 Progress: Progressing toward goal Long Term Goals Time to Complete Long Term Goals: 8 weeks Long Term Goal 1: Patient will complete BADLs and leisure activities at highest level of I possible. Long Term Goal 1 Progress: Progressing toward goal Long Term Goal 2: Patient will increase right shoulder AROM to 90 flexion and abduction and 50 external rotation for increased ability to close her mini blinds and reach the computer keyboard. Long Term Goal 2 Progress: Progressing toward goal Long Term Goal 3: Patient will increase right shoulder strength to 4-/5 in given range for increased ability to lift plates of food into her microwave. Long Term Goal 3 Progress: Progressing toward goal Long Term Goal 4: Patient will decrease pain to 1-2/10 while completing daily activities. Long Term Goal 4 Progress: Progressing toward goal Long Term Goal 5: Patient will decrease fascial restrictions to min-mod in her right shoulder. Long Term Goal 5 Progress: Progressing toward goal  Problem List Patient Active Problem List  Diagnoses  . Pain in joint,  shoulder region  . Rotator cuff tear arthropathy of right shoulder  . Muscle weakness (generalized)    End of Session Activity Tolerance: Patient tolerated treatment well General Behavior During Session: [facility] for tasks performed Cognition: [facility] for tasks performed  GO No functional reporting required  Laverta Baltimore, OTS Occupational Therapy Student  06/11/2011, 2:56 PM

## 2011-06-11 NOTE — Evaluation (Signed)
Physical Therapy Evaluation  Patient Details  Name: Catherine Mccullough MRN: [id number] Date of Birth: [birth year]  Today's Date: 06/11/2011 Time: 1300-1350 PT Time Calculation (min): 50 min  Visit#: 1  of 8   Re-eval: 07/11/11 Assessment Diagnosis: R knee pain, lymphedema  Authorization:    Authorization Time Period:    Authorization Visit#:   of     Past Medical History:  Past Medical History  Diagnosis Date  . Kidney disease   . Hypertension   . Diabetes mellitus   . 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 Arthritis   . CHF (congestive heart failure)   . Lymphedema of leg   . Anxiety    Past Surgical History:  Past Surgical History  Procedure Date  . Breast surgery   . Cholecystectomy   . Abdominal hysterectomy   . Replacement total knee bilateral   . Hand tendon surgery     Subjective Symptoms/Limitations Symptoms:Catherine Mccullough states that she has not really noticed any swelling in her forearm area or her hand she has just noticed swelling in her shoulder where she hurts.  She states that she fell out of her bed  in January and hit her left knee that has never quit hurting .  She states that she has been referred to physical therapy for both her lymphedema and her left  Knee but she can not notice any visible lymphedema .  The patient states that she is seeing OT for her right shoulder pain.   Pertinent History: CHF , fibromyalgia,DM, Gerd, Breast Ca, B masectomy, Bone CA, kidney failure, arthroscopic surgery B knee  How long can you sit comfortably?: The patient states that she can only sit for ten minutes without feeling like she wants to move her knee. How long can you stand comfortably?: The patient states that the most she can stand is for ten minutes. How long can you walk comfortably?: The patient is currently walking with a walker.  She can walk for thirty minutes with her walker.  She would like to walk with a cane.   Pain Assessment Currently in Pain?: Yes Pain Score:    7 Pain Location: Knee Pain Orientation: Left Pain Type: Chronic pain Pain Relieving Factors: Heat, pain meds  Precautions/Restrictions  Precautions Precautions:  (has had cancer in the past, currently in remission )   Assessment L UE;  Pain and limitations are being addressed by OT.  No lymphedema is apparent.  RLE Strength Right Hip Flexion: 5/5 Right Hip Extension: 2/5 Right Hip ABduction: 5/5 Right Hip ADduction: 4/5 Right Knee Flexion: 3+/5 Right Knee Extension: 5/5 Right Ankle Dorsiflexion: 3/5  LLE AROM (degrees) Left Knee Extension 0-130: 0  Left Knee Flexion 0-140: 130  LLE Strength Left Hip Flexion: 5/5 Left Hip Extension: 2/5 Left Hip ABduction: 4/5 Left Hip ADduction: 4/5 Left Knee Flexion: 3/5 Left Knee Extension: 5/5 Left Ankle Dorsiflexion: 3/5   Exercise/Treatments Mobility/Balance  Static Standing Balance Single Leg Stance - Right Leg: 0  Single Leg Stance - Left Leg: 3      Seated Long Arc Quad: Left;5 sets Supine Quad Sets: Left;5 reps Hip Adduction Isometric: 5 reps Bridges: 5 reps   Manual Therapy Manual Therapy: Myofascial release Massage: Gentle massage and PROM to bilateral scapular, trapezius, shoulder regions and R upper arm to decrease pain and increase pain free AROM.  Physical Therapy Assessment and Plan PT Assessment and Plan Clinical Impression Statement: Pt with decreased strength of B LE  along with decreased balance who will benefit from skilled PT to improve pain, mobility and quality of life. Pt will benefit from skilled therapeutic intervention in order to improve on the following deficits: Decreased strength;Difficulty walking;Pain;Decreased balance Rehab Potential: Good PT Frequency: Min 3X/week PT Duration: 4 weeks PT Treatment/Interventions: Therapeutic activities;Therapeutic exercise;Patient/family education;Gait training PT Plan: begin gait training with a cane, work on balance and strengthening exercises.  Next  treatment begin bike, rocker board, SLS B, knee flex w/3 # L, lateral step up, heel  raise, mini squats.      Goals Home Exercise Program Pt will Perform Home Exercise Program: Independently PT Short Term Goals Time to Complete Short Term Goals: 2 weeks PT Short Term Goal 1: Pain decreased by 3 levels PT Short Term Goal 2: Pt to ambulate in the home with a cane PT Short Term Goal 3: Pt to be able to sit for 30 minutes without knee pain or stiffness. PT Short Term Goal 4: Pt to be able to stand for 20 minutes to make a small meal. PT Long Term Goals Time to Complete Long Term Goals: 4 weeks PT Long Term Goal 1: I in advance HEP PT Long Term Goal 2: LE strength increased one grade  Long Term Goal 3: SLS to be to 10 seconds B to improve safety of walking Long Term Goal 4: Pt to begin walking with a cane outside. PT Long Term Goal 5: Pt to be able to sit for 60 minutes for traveling or to watch a t.v. show   Problem List Patient Active Problem List  Diagnoses  . Pain in joint, shoulder region  . Rotator cuff tear arthropathy of right shoulder  . Muscle weakness (generalized)  . Left leg weakness  . Difficulty in walking    PT - End of Session Activity Tolerance: Patient tolerated treatment well General Behavior During Session: [facility] for tasks performed Cognition: [facility] for tasks performed PT Plan of Care PT Home Exercise Plan: given Consulted and Agree with Plan of Care: Patient    [place] Phifer,CINDY 06/11/2011, 5:07 PM  Physician Documentation Your signature is required to indicate approval of the treatment plan as stated above.  Please sign and either send electronically or make a copy of this report for your files and return this physician signed original.   Please mark one 1.__approve of plan  2. ___approve of plan with the following conditions.   ______________________________                                                          _____________________ Physician Signature                                                                                                              Date

## 2011-06-11 NOTE — Progress Notes (Signed)
Note reviewed by clinical instructor and accurately reflects treatment session.  Aneya Daddona L. Ivaan Liddy, COTA/L  

## 2011-06-13 ENCOUNTER — Ambulatory Visit (HOSPITAL_COMMUNITY)
Admission: RE | Admit: 2011-06-13 | Discharge: 2011-06-13 | Disposition: A | Discharge status: Home / Self Care | Payer: PRIVATE HEALTH INSURANCE | Source: Ambulatory Visit | Attending: Student | Admitting: Student

## 2011-06-13 DIAGNOSIS — M6281 Muscle weakness (generalized): Secondary | ICD-10-CM

## 2011-06-13 DIAGNOSIS — R29898 Other symptoms and signs involving the musculoskeletal system: Secondary | ICD-10-CM

## 2011-06-13 DIAGNOSIS — R262 Difficulty in walking, not elsewhere classified: Secondary | ICD-10-CM

## 2011-06-13 DIAGNOSIS — M25519 Pain in unspecified shoulder: Secondary | ICD-10-CM

## 2011-06-13 DIAGNOSIS — M12811 Other specific arthropathies, not elsewhere classified, right shoulder: Secondary | ICD-10-CM

## 2011-06-13 NOTE — Progress Notes (Signed)
Physical Therapy Treatment Patient Details  Name: Catherine Mccullough MRN: [id number] Date of Birth: July 02, 1945  Today's Date: 06/13/2011 Time: 0923-1035 PT Time Calculation (min): 72 min  Visit#: 2  of 8   Re-eval: 07/11/11  Charge: gait 15 min therex 42 min Manual 5 min Ice 10  Subjective: Symptoms/Limitations Symptoms: Pt entered dept ambulating with RW, stated L knee feels stiff due to the weather outside, pain scale 5/10 today.  Pt reported spinal shots have helped reduce pain in general all over body. Pain Assessment Currently in Pain?: Yes Pain Score:   5 Pain Location: Knee Pain Orientation: Left  Objective:   Exercise/Treatments Standing Heel Raises: 20 reps;Limitations Heel Raises Limitations: toe raises 20 reps Knee Flexion: 10 reps;Limitations Knee Flexion Limitations: 3# Lateral Step Up: Left;5 reps;Hand Hold: 2;Step Height: 4" Forward Step Up: Left;5 reps;Hand Hold: 1;Step Height: 4" Functional Squat: 10 reps;3 seconds;Limitations Functional Squat Limitations: mini sqa Rocker Board: 1 minute;Limitations Rocker Board Limitations: R/L pt c/o patella popping SLS: R 5", L 4" multiple attempts Gait Training: gait training into hallway in hospital with SPC Seated Long Arc Quad: Left;10 reps Supine Hip Adduction Isometric: 10 reps;Limitations Hip Adduction Isometric Limitations: 5 sec holds Bridges: 10 reps   Modalities Modalities: Cryotherapy Manual Therapy Manual Therapy: Joint mobilization Joint Mobilization: patella mobs R/L and up/down, tib/fib A/P. Cryotherapy Number Minutes Cryotherapy: 10 Minutes Cryotherapy Location: Knee Type of Cryotherapy: Ice pack  Physical Therapy Assessment and Plan PT Assessment and Plan Clinical Impression Statement: Gait training with SPC with vc-ing for proper sequence.  Began therex per PT POC for balance and overall strengthening,  Pt able to complete most activities with good form following demonstration and vc-ing  for technique.  Pt did c/o knee popping with rocker board this session.  Manual patella mobs complete with no popping wtih gait at end of session. PT Plan: Continue with gait training with SPC, strengthen and balance training, when able to independently tandem stance 30" with no assistance required progress to tandem gait.  Begin sit to stands without HHA next session.    Goals    Problem List Patient Active Problem List  Diagnoses  . Pain in joint, shoulder region  . Rotator cuff tear arthropathy of right shoulder  . Muscle weakness (generalized)  . Left leg weakness  . Difficulty in walking    PT - End of Session Equipment Utilized During Treatment: Gait belt Activity Tolerance: Patient tolerated treatment well General Behavior During Session: [facility] for tasks performed Cognition: [facility] for tasks performed  GP No functional reporting required  Juel Burrow, PTA 06/13/2011, 10:26 AM

## 2011-06-13 NOTE — Progress Notes (Signed)
Occupational Therapy Treatment Patient Details  Name: Catherine Mccullough MRN: [id number] Date of Birth: 04-25-1945  Today's Date: 06/13/2011 Time: 1191-4782 OT Time Calculation (min): 42 min Manual Therapy 9562-1308 18' Therapeutic Exercise 1132-1155 23'  Visit#: 4  of 16   Re-eval: 06/26/11 Assessment Diagnosis: Right Shoulder Pain  Subjective Symptoms/Limitations Symptoms: S:  It is not as bad the weather makes it feel bad. Pain Assessment Currently in Pain?: Yes Pain Score:   5 Pain Location: Shoulder Pain Orientation: Right  Precautions/Restrictions     Exercise/Treatments Supine Protraction: PROM;10 reps External Rotation: PROM;10 reps Internal Rotation: PROM;10 reps Flexion: PROM;10 reps ABduction: PROM;10 reps Seated Elevation: AROM;12 reps Extension: AROM;12 reps Retraction: AROM;12 reps Row: AROM;12 reps Other Seated Exercises: Table slides on wedge x12 Pulleys Flexion: 2 minutes ABduction: 2 minutes Therapy Ball Flexion: 15 reps ABduction: 15 reps    Manual Therapy Manual Therapy: Myofascial release Massage: Gentle massage and PROM to bilateral scapular, trapezius, shoulder regions and R upper arm to decrease pain and increase pain free AROM  Occupational Therapy Assessment and Plan OT Assessment and Plan Clinical Impression Statement: A: Fatigued after PT and wait for OT.  Patient states she found a large pen which was easy to grip and her handwriting is much better. OT Plan: P:  Add digiflex.   Goals Short Term Goals Time to Complete Short Term Goals: 4 weeks Short Term Goal 1: Patient will be educated on HEP. Short Term Goal 2: Patient will increase PROM of right shoulder to [facility] for increased I reaching overhead. Short Term Goal 3: Patient will increase right shoulder strength to 3+/5 for increased ability to pick up shoes off of the floor. Short Term Goal 4: Patient will decrease pain to 3/10 in her right shoulder when getting dressed. Short  Term Goal 5: Patient will decrease fasical restrictions from max to mod in her right shoulder and chest. Additional Short Term Goals?: Yes Short Term Goal 6: Patient will decrease shoulder pain with handwriting by 50%. Long Term Goals Time to Complete Long Term Goals: 8 weeks Long Term Goal 1: Patient will complete BADLs and leisure activities at highest level of I possible. Long Term Goal 2: Patient will increase right shoulder AROM to 90 flexion and abduction and 50 external rotation for increased ability to close her mini blinds and reach the computer keyboard. Long Term Goal 3: Patient will increase right shoulder strength to 4-/5 in given range for increased ability to lift plates of food into her microwave. Long Term Goal 4: Patient will decrease pain to 1-2/10 while completing daily activities. Long Term Goal 5: Patient will decrease fascial restrictions to min-mod in her right shoulder.  Problem List Patient Active Problem List  Diagnoses  . Pain in joint, shoulder region  . Rotator cuff tear arthropathy of right shoulder  . Muscle weakness (generalized)  . Left leg weakness  . Difficulty in walking    End of Session Activity Tolerance: Patient tolerated treatment well General Behavior During Session: [facility] for tasks performed Cognition: [facility] for tasks performed  GO No functional reporting required   Ileene Allie L. Noralee Stain, COTA/L  06/13/2011, 2:32 PM

## 2011-06-17 ENCOUNTER — Inpatient Hospital Stay (HOSPITAL_COMMUNITY)
Admission: RE | Admit: 2011-06-17 | Payer: PRIVATE HEALTH INSURANCE | Source: Ambulatory Visit | Admitting: Occupational Therapy

## 2011-06-19 ENCOUNTER — Inpatient Hospital Stay (HOSPITAL_COMMUNITY): Admission: RE | Admit: 2011-06-19 | Payer: PRIVATE HEALTH INSURANCE | Source: Ambulatory Visit | Admitting: Specialist

## 2011-06-23 ENCOUNTER — Emergency Department (HOSPITAL_COMMUNITY)
Admission: EM | Admit: 2011-06-23 | Discharge: 2011-06-23 | Disposition: A | Discharge status: Home / Self Care | Payer: PRIVATE HEALTH INSURANCE | Attending: Emergency Medicine | Admitting: Emergency Medicine

## 2011-06-23 ENCOUNTER — Encounter (HOSPITAL_COMMUNITY): Payer: Self-pay | Admitting: *Deleted

## 2011-06-23 DIAGNOSIS — M549 Dorsalgia, unspecified: Secondary | ICD-10-CM | POA: Insufficient documentation

## 2011-06-23 DIAGNOSIS — M129 Arthropathy, unspecified: Secondary | ICD-10-CM | POA: Insufficient documentation

## 2011-06-23 DIAGNOSIS — Z85830 Personal history of malignant neoplasm of bone: Secondary | ICD-10-CM | POA: Insufficient documentation

## 2011-06-23 DIAGNOSIS — E119 Type 2 diabetes mellitus without complications: Secondary | ICD-10-CM | POA: Insufficient documentation

## 2011-06-23 DIAGNOSIS — B029 Zoster without complications: Secondary | ICD-10-CM

## 2011-06-23 DIAGNOSIS — I509 Heart failure, unspecified: Secondary | ICD-10-CM | POA: Insufficient documentation

## 2011-06-23 DIAGNOSIS — Z853 Personal history of malignant neoplasm of breast: Secondary | ICD-10-CM | POA: Insufficient documentation

## 2011-06-23 DIAGNOSIS — I10 Essential (primary) hypertension: Secondary | ICD-10-CM | POA: Insufficient documentation

## 2011-06-23 MED ORDER — OXYCODONE-ACETAMINOPHEN 5-325 MG PO TABS
1.0000 | ORAL_TABLET | ORAL | Status: DC | PRN
Start: 2011-06-23 — End: 2011-06-23

## 2011-06-23 MED ORDER — VALACYCLOVIR HCL 1 G PO TABS: 1000.0000 mg | ORAL_TABLET | Freq: Three times a day (TID) | ORAL | Status: AC

## 2011-06-23 MED ORDER — OXYCODONE-ACETAMINOPHEN 5-325 MG PO TABS: 1.0000 | ORAL_TABLET | ORAL | Status: AC | PRN

## 2011-06-23 MED ORDER — ACYCLOVIR 800 MG PO TABS
800.0000 mg | ORAL_TABLET | Freq: Once | ORAL | Status: AC
  Administered 2011-06-23: 800 mg via ORAL
  Filled 2011-06-23: qty 1

## 2011-06-23 NOTE — ED Provider Notes (Signed)
History     CSN: [id number]  Arrival date & time 06/23/11  1658   First MD Initiated Contact with Patient 06/23/11 1758      Chief Complaint  Patient presents with  . Herpes Zoster    (Consider location/radiation/quality/duration/timing/severity/associated sxs/prior treatment) Patient is a 66 y.o. female presenting with back pain. The history is provided by the patient.  Back Pain  This is a new problem. The current episode started 2 days ago. The problem occurs constantly. The problem has been gradually worsening. The pain is associated with no known injury. The pain is present in the lumbar spine. The quality of the pain is described as burning and aching. Radiates to: left hip. The pain is moderate. The symptoms are aggravated by certain positions. The pain is the same all the time. Associated symptoms include tingling. Pertinent negatives include no chest pain, no fever, no numbness, no abdominal pain, no abdominal swelling, no bowel incontinence, no perianal numbness, no bladder incontinence, no dysuria, no pelvic pain, no leg pain, no paresthesias, no paresis and no weakness. Associated symptoms comments: Rash with blisters. She has tried analgesics for the symptoms. The treatment provided no relief.    Past Medical History  Diagnosis Date  . Kidney disease   . Hypertension   . Diabetes mellitus   . 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 Arthritis   . CHF (congestive heart failure)   . Lymphedema of leg   . Anxiety     Past Surgical History  Procedure Date  . Breast surgery   . Cholecystectomy   . Abdominal hysterectomy   . Replacement total knee bilateral   . Hand tendon surgery     History reviewed. No pertinent family history.  History  Substance Use Topics  . Smoking status: Never Smoker   . Smokeless tobacco: Not on file  . Alcohol Use: No    OB History    Grav Para Term Preterm Abortions TAB SAB Ect Mult Living                  Review of Systems    Constitutional: Negative for fever.  Respiratory: Negative for shortness of breath.   Cardiovascular: Negative for chest pain.  Gastrointestinal: Negative for vomiting, abdominal pain, constipation and bowel incontinence.  Genitourinary: Negative for bladder incontinence, dysuria, hematuria, flank pain, decreased urine volume, difficulty urinating and pelvic pain.       Perineal numbness or incontinence of urine or feces  Musculoskeletal: Positive for back pain. Negative for joint swelling.  Skin: Positive for rash. Negative for pallor.  Neurological: Positive for tingling. Negative for weakness, numbness and paresthesias.  All other systems reviewed and are negative.    Allergies  Mometasone; Aspirin; Contrast media; Cortisone; Lyrica; Other; Prednisone; Sulfamethoxazole; Ultram; Codeine; Dilantin; and Zosyn  Home Medications   Current Outpatient Rx  Name Route Sig Dispense Refill  . AMLODIPINE BESYLATE 10 MG PO TABS Oral Take 10 mg by mouth daily.      . ASPIRIN EC 81 MG PO TBEC Oral Take 81 mg by mouth daily.      Marland Kitchen CITALOPRAM HYDROBROMIDE 40 MG PO TABS Oral Take 40 mg by mouth daily.      Marland Kitchen ESOMEPRAZOLE MAGNESIUM 40 MG PO CPDR Oral Take 40 mg by mouth daily before breakfast.      . EXEMESTANE 25 MG PO TABS Oral Take 25 mg by mouth daily.      . FENTANYL 25 MCG/HR  TD PT72 Transdermal Place 1 patch onto the skin every 3 (three) days. For pain. **Remove used patches and apply a new patch for each application**  Time for patch to be changed today 01/11/11...    . FEXOFENADINE HCL 180 MG PO TABS Oral Take 180 mg by mouth daily.      . FUROSEMIDE 40 MG PO TABS Oral Take 40 mg by mouth daily.     Marland Kitchen GABAPENTIN 300 MG PO CAPS Oral Take 300 mg by mouth 3 (three) times daily.      Marland Kitchen HYDROMORPHONE HCL 2 MG PO TABS Oral Take 2-4 mg by mouth every 4 (four) hours as needed. For pain    . LABETALOL HCL 100 MG PO TABS Oral Take 100 mg by mouth 2 (two) times daily.      Marland Kitchen MONTELUKAST SODIUM 10 MG PO  TABS Oral Take 10 mg by mouth at bedtime.      Carma Leaven M PLUS PO TABS Oral Take 1 tablet by mouth daily.      Marland Kitchen ONDANSETRON HCL 8 MG PO TABS Oral Take 8 mg by mouth every 8 (eight) hours as needed. nausea       BP 155/89  Pulse 86  Temp 97.9 F (36.6 C) (Oral)  Resp 20  Ht 4' 7.5" (1.41 m)  Wt 110 lb (49.896 kg)  BMI 25.11 kg/m2  SpO2 99%  Physical Exam  Nursing note and vitals reviewed. Constitutional: She is oriented to person, place, and time. She appears well-developed and well-nourished. No distress.  HENT:  Head: Normocephalic and atraumatic.  Neck: Normal range of motion. Neck supple.  Cardiovascular: Normal rate, regular rhythm and intact distal pulses.   No murmur heard. Pulmonary/Chest: Effort normal and breath sounds normal.  Musculoskeletal: She exhibits tenderness. She exhibits no edema.       Lumbar back: She exhibits tenderness and pain. She exhibits normal range of motion, no bony tenderness, no swelling, no edema, no deformity, no laceration and normal pulse.       Back:  Neurological: She is alert and oriented to person, place, and time. No cranial nerve deficit or sensory deficit. She exhibits normal muscle tone. Coordination and gait normal.  Reflex Scores:      Patellar reflexes are 2+ on the right side and 2+ on the left side.      Achilles reflexes are 2+ on the right side and 2+ on the left side. Skin: Skin is warm and dry. Rash noted.       See MS exam    ED Course  Procedures (including critical care time)       MDM     Patient is alert, vitals stable.  Non-toxic appearing.  Vesicles to the left lumbar region and extends to the left hip.  No focal neuro deficits.   Will treat with valtrex and percocet #24.  Pt agrees to close f/u with her PMD this week.  Advised pt of importance of close PMD f/u due to possibility of PHN.  Pt verbalized understanding and agrees to care plan  The patient appears reasonably screened and/or stabilized for  discharge and I doubt any other medical condition or other [facility] requiring further screening, evaluation, or treatment in the ED at this time prior to discharge.    Naava Janeway L. Cono Gebhard, [place] 06/26/11 2231

## 2011-06-23 NOTE — ED Notes (Signed)
Low back pain with rash.blisters

## 2011-06-24 ENCOUNTER — Telehealth (HOSPITAL_COMMUNITY): Payer: Self-pay

## 2011-06-24 ENCOUNTER — Ambulatory Visit (HOSPITAL_COMMUNITY): Payer: PRIVATE HEALTH INSURANCE | Admitting: *Deleted

## 2011-06-26 ENCOUNTER — Ambulatory Visit (HOSPITAL_COMMUNITY): Payer: PRIVATE HEALTH INSURANCE | Admitting: Specialist

## 2011-07-01 ENCOUNTER — Inpatient Hospital Stay (HOSPITAL_COMMUNITY)
Admission: RE | Admit: 2011-07-01 | Payer: PRIVATE HEALTH INSURANCE | Source: Ambulatory Visit | Admitting: Physical Therapy

## 2011-07-01 NOTE — ED Provider Notes (Signed)
Medical screening examination/treatment/procedure(s) were performed by non-physician practitioner and as supervising physician I was immediately available for consultation/collaboration.  Luticia Tadros, MD 07/01/11 1531 

## 2011-07-03 ENCOUNTER — Ambulatory Visit (HOSPITAL_COMMUNITY): Payer: PRIVATE HEALTH INSURANCE | Admitting: Physical Therapy

## 2011-07-03 ENCOUNTER — Ambulatory Visit (HOSPITAL_COMMUNITY): Payer: PRIVATE HEALTH INSURANCE | Admitting: Specialist

## 2011-07-08 ENCOUNTER — Ambulatory Visit (HOSPITAL_COMMUNITY)
Admission: RE | Admit: 2011-07-08 | Discharge: 2011-07-08 | Disposition: A | Discharge status: Home / Self Care | Payer: PRIVATE HEALTH INSURANCE | Source: Ambulatory Visit | Attending: Student | Admitting: Student

## 2011-07-08 DIAGNOSIS — M6281 Muscle weakness (generalized): Secondary | ICD-10-CM | POA: Insufficient documentation

## 2011-07-08 DIAGNOSIS — IMO0001 Reserved for inherently not codable concepts without codable children: Secondary | ICD-10-CM | POA: Insufficient documentation

## 2011-07-08 DIAGNOSIS — M25519 Pain in unspecified shoulder: Secondary | ICD-10-CM | POA: Insufficient documentation

## 2011-07-08 NOTE — Progress Notes (Signed)
Physical Therapy Treatment Patient Details  Name: Catherine Mccullough MRN: [id number] Date of Birth: 1945-11-27  Today's Date: 07/08/2011 Time: 6295-2841 PT Time Calculation (min): 44 min Charges:  therex 24', gait 15'  Visit#: 3  of 8   Re-eval: 07/11/11 Authorization: [employer]   Subjective: Symptoms/Limitations Symptoms: Pt reports she keeps pain, 10/10, she has just learned how to live with it. Pain Assessment Currently in Pain?: Yes Pain Score: 10-Worst pain ever Pain Radiating Towards: Pt. c/o pain in B LE's with audible crepitus in hips/knees    Exercises started by Donnamae Jude, PT , 13:04-13:14 Exercise/Treatments Aerobic Stationary Bike: 3' @ 3.5 (unable to continue due to fatigue) Standing Heel Raises: 20 reps Heel Raises Limitations: toe raises 20 reps Knee Flexion: 10 reps;Limitations Knee Flexion Limitations: 3# Lateral Step Up: Limitations Lateral Step Up Limitations: unable today due to pain/popping Forward Step Up: Left;10 reps;Step Height: 2";Hand Hold: 1 Functional Squat: 10 reps;3 seconds;Limitations Functional Squat Limitations: limited motion due to L hip pain/crepitus Rocker Board: Limitations Rocker Board Limitations: unable today SLS: R 6", L 10" multiple attempts Gait Training: gait training X340' with SPC Other Standing Knee Exercises: tandem stance 30" each Seated Long Arc Quad: Left;10 reps Other Seated Knee Exercises: sit to stand no UE's 5 reps     Physical Therapy Assessment and Plan PT Assessment and Plan Clinical Impression Statement: Began bike, however pt. unable to complete greater than 3' due to decreased endurance.  Pt. with audible crepitus with many WB activites and unable to perform rockerboard or lateral step ups due to untolerable pain in L knee.  Able to perform forward step ups with decrease to 2" step.  Pt. able to complete 340' gait with [facility] withouth rest break or early fatigue.  Added sit to stands and tandem stance  withi good control.   PT Plan: Continue to progress strength and balance; re-eval due next visit.     Problem List Patient Active Problem List  Diagnosis  . Pain in joint, shoulder region  . Rotator cuff tear arthropathy of right shoulder  . Muscle weakness (generalized)  . Left leg weakness  . Difficulty in walking    PT - End of Session Equipment Utilized During Treatment: Gait belt Activity Tolerance: Patient tolerated treatment well General Behavior During Session: [facility] for tasks performed   Lurena Nida, PTA/CLT 07/08/2011, 2:02 PM

## 2011-07-08 NOTE — Progress Notes (Signed)
Occupational Therapy Treatment Patient Details  Name: Catherine Mccullough MRN: [id number] Date of Birth: [birth year]  Today's Date: 07/08/2011 Time: 5784-6962 OT Time Calculation (min): 47 min Manual Therapy [id number] 22' Reassessment 1410-1420 10' Therapeutic Exercises [phone] 15' Visit#: 5  of 16   Re-eval: 08/05/11    Subjective S:  I can reach into the first shelf of a cabinet now. Pain Assessment Currently in Pain?: Yes Pain Score: 10-Worst pain ever Pain Radiating Towards: Pt. c/o pain in B LE's with audible crepitus in hips/knees  Precautions/Restrictions   N/A  Exercise/Treatments Supine Protraction: PROM;AAROM;10 reps External Rotation: PROM;AAROM;10 reps Internal Rotation: PROM;AAROM;10 reps Flexion: PROM;AAROM;10 reps ABduction: PROM;AAROM;10 reps Seated Elevation: AROM;12 reps Extension: AROM;12 reps Retraction: AROM;12 reps Row: AROM;12 reps Pulleys Flexion: 2 minutes ABduction: 2 minutes (required facilitation for proper positioning initially) Therapy Ball Flexion: 15 reps ABduction: 15 reps    Manual Therapy Manual Therapy: Massage Massage: Massage and passive stretching to right scapular region, upper arm, and shoulder region to decrease pain and promote pain free A/PROM.  [id number]  Occupational Therapy Assessment and Plan OT Assessment and Plan Clinical Impression Statement: A:  Please refer to monthly progress note found in letter section of this chart for details.  Scored 68 on DASH, indicating a moderate disability rating for RUE with ADLs.  Required facilitation with pulleys into abduction. OT Frequency: Min 2X/week OT Duration: 4 weeks OT Plan: P:  Continue skilled OT to increase ability to reach into overhead cabinets and to build grip strength needed for greater I with opening containers and improving fluidity of handwriting.  Attempt dowel in seated and  add grip strengthening exercises.   Goals Short Term Goals Time to Complete Short  Term Goals: 4 weeks Short Term Goal 1: Patient will be educated on HEP. Short Term Goal 1 Progress: Met Short Term Goal 2: Patient will increase PROM of right shoulder to [facility] for increased I reaching overhead. Short Term Goal 2 Progress: Progressing toward goal Short Term Goal 3: Patient will increase right shoulder strength to 3+/5 for increased ability to pick up shoes off of the floor. Short Term Goal 3 Progress: Progressing toward goal Short Term Goal 4: Patient will decrease pain to 3/10 in her right shoulder when getting dressed. Short Term Goal 4 Progress: Progressing toward goal Short Term Goal 5: Patient will decrease fasical restrictions from max to mod in her right shoulder and chest. Short Term Goal 5 Progress: Met Additional Short Term Goals?: Yes Short Term Goal 6: Patient will decrease shoulder pain with handwriting by 50%. Short Term Goal 6 Progress: Progressing toward goal Long Term Goals Time to Complete Long Term Goals: 8 weeks Long Term Goal 1: Patient will complete BADLs and leisure activities at highest level of I possible. Long Term Goal 1 Progress: Progressing toward goal Long Term Goal 2: Patient will increase right shoulder AROM to 90 flexion and abduction and 50 external rotation for increased ability to close her mini blinds and reach the computer keyboard. Long Term Goal 2 Progress: Progressing toward goal Long Term Goal 3: Patient will increase right shoulder strength to 4-/5 in given range for increased ability to lift plates of food into her microwave. Long Term Goal 3 Progress: Progressing toward goal Long Term Goal 4: Patient will decrease pain to 1-2/10 while completing daily activities. Long Term Goal 4 Progress: Progressing toward goal Long Term Goal 5: Patient will decrease fascial restrictions to min-mod in her right shoulder. Long Term Goal 5 Progress: Progressing  toward goal  Problem List Patient Active Problem List  Diagnosis  . Pain in joint,  shoulder region  . Rotator cuff tear arthropathy of right shoulder  . Muscle weakness (generalized)  . Left leg weakness  . Difficulty in walking    End of Session Activity Tolerance: Patient tolerated treatment well General Behavior During Session: [facility] for tasks performed Cognition: [facility] for tasks performed OT Plan of Care OT Home Exercise Plan: Reviewed HEP and recommended continuation of current exercise plan.  GO    Shirlean Mylar, OTR/L  07/08/2011, 2:54 PM

## 2011-07-09 ENCOUNTER — Ambulatory Visit (HOSPITAL_COMMUNITY)
Admission: RE | Admit: 2011-07-09 | Discharge: 2011-07-09 | Disposition: A | Discharge status: Home / Self Care | Payer: PRIVATE HEALTH INSURANCE | Source: Ambulatory Visit | Attending: Student | Admitting: Student

## 2011-07-09 DIAGNOSIS — M25519 Pain in unspecified shoulder: Secondary | ICD-10-CM

## 2011-07-09 DIAGNOSIS — M6281 Muscle weakness (generalized): Secondary | ICD-10-CM

## 2011-07-09 DIAGNOSIS — R262 Difficulty in walking, not elsewhere classified: Secondary | ICD-10-CM

## 2011-07-09 DIAGNOSIS — R29898 Other symptoms and signs involving the musculoskeletal system: Secondary | ICD-10-CM

## 2011-07-09 DIAGNOSIS — M75101 Unspecified rotator cuff tear or rupture of right shoulder, not specified as traumatic: Secondary | ICD-10-CM

## 2011-07-09 NOTE — Evaluation (Signed)
Physical Therapy Re-evaluation  Patient Details  Name: Catherine Mccullough MRN: [id number] Date of Birth: 09-Dec-1945  Today's Date: 07/09/2011 Time: 1350-1430 PT Time Calculation (min): 40 min  Visit#: 4  of 8   Re-eval: 08/08/11 Assessment Diagnosis: R knee pain, lymphedema   Past Medical History:  Past Medical History  Diagnosis Date  . Kidney disease   . Hypertension   . Diabetes mellitus   . 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 Arthritis   . CHF (congestive heart failure)   . Lymphedema of leg   . Anxiety    Past Surgical History:  Past Surgical History  Procedure Date  . Breast surgery   . Cholecystectomy   . Abdominal hysterectomy   . Replacement total knee bilateral   . Hand tendon surgery     Subjective Symptoms/Limitations Symptoms: Pt states that her L knee hurts more than the right knee. How long can you sit comfortably?: The patient states that she can sit for 20 minutes before she needs to move her knee.  Pt was able to sit for 10 minute. How long can you stand comfortably?: The patient states that she is able to stand for 15 minutes was 10 minutes. How long can you walk comfortably?: The patient continues to walk with her walker.  She is able to walk for .  Was 30 minutes. Pt would like to walk with a cane. Pain Assessment Currently in Pain?: Yes Pain Score:   8 Pain Location: Shoulder Pain Orientation: Right Pain Type: Chronic pain  Precautions/Restrictions  Precautions Precautions:  (has had cancer in the past, currently in remission )  Prior Functioning  Home Living Lives With: Alone Type of Home: Apartment Home Access: Elevator Prior Function Level of Independence: Independent with basic ADLs (Requires increased time to complete BADLs, uses 4 wheel RW) Driving: No Vocation: Retired Leisure: Hobbies-yes (Comment)  Cognition/Observation Cognition Overall Cognitive Status: Appears within functional limits for tasks  assessed Observation/Other Assessments Observations: severe crepitis in her right shoulder and elbow joint.  Lymphedema present in BUE.  Healed surgical incision on right chest where portacath was removed.  Sensation/Coordination/Flexibility/Functional Tests Sensation Light Touch: Appears Intact Coordination Gross Motor Movements are Fluid and Coordinated: Yes Fine Motor Movements are Fluid and Coordinated: Yes  Assessment RLE Strength Right Hip Flexion: 5/5 Right Hip Extension: 3/5 (was 2/5) Right Hip ABduction: 5/5 Right Hip ADduction: 5/5 (was 4/5) Right Knee Flexion: 4/5 (was 3+) Right Knee Extension: 5/5 Right Ankle Dorsiflexion: 5/5 LLE AROM (degrees) Left Knee Extension: 0  Left Knee Flexion: 130  LLE Strength Left Hip Flexion: 5/5 Left Hip Extension: 3/5 (was 2/5) Left Hip ABduction: 4/5 (4+/5 was 4/5) Left Hip ADduction: 5/5 (was 4/5) Left Knee Flexion: 3+/5 (was 3/5) Left Knee Extension: 5/5 Left Ankle Dorsiflexion: 4/5 (was 3/5)  Exercise/Treatments Mobility/Balance  Static Standing Balance Single Leg Stance - Right Leg: 7  (was 0) Single Leg Stance - Left Leg: 10  (was 3)    Aerobic Stationary Bike: 1.0x 6' Standing Heel Raises: 15 reps Functional Squat: 10 reps SLS: R 10"; L 7" Gait Training: gt with cane may D/C Other Standing Knee Exercises: tandem stance x 30" @ Seated Other Seated Knee Exercises: sit to stand x 10 R ; x 5 L Side-lying Hip ABduction: Left;10 reps;Limitations Hip ABduction Limitations: 3# Prone  Hamstring Curl: 10 reps;Limitations Hamstring Curl Limitations: 5# R ; 3# L Hip Extension: Both;10 reps   Physical Therapy Assessment and Plan PT  Assessment and Plan Clinical Impression Statement: Pt has only been seen for 50% of her scheduled appointments secondary to other illness.  PT has progressed well with the visits she has had but needs continued strengthening and balance training to achieve pt goal of ambulating with a cane  only safely.  Recommend to continue current treatment.   Pt will benefit from skilled therapeutic intervention in order to improve on the following deficits: Decreased activity tolerance;Decreased strength;Decreased balance Rehab Potential: Good PT Frequency: Min 2X/week PT Duration: 4 weeks PT Treatment/Interventions: Gait training PT Plan: begin tandem gt, gt outside with cane    Goals Home Exercise Program PT Goal: Perform Home Exercise Program - Progress: Progressing toward goal (Pt became ill and quit doing HEP; she is going to start) PT Short Term Goals PT Short Term Goal 1: Pt states her pain varies.   PT Short Term Goal 1 - Progress: Progressing toward goal PT Short Term Goal 2: Pt states she is using a cane and sometimes no assitive device in the home PT Short Term Goal 2 - Progress: Met PT Short Term Goal 3: Pt states she is able to sit for 20 minutes Goal was 30 initial eval 10 PT Short Term Goal 3 - Progress: Progressing toward goal PT Short Term Goal 4: Pt states she is able to stand for 15 min. goal 20 inital 10 PT Short Term Goal 4 - Progress: Progressing toward goal PT Short Term Goal 5: New goal as of 07/09/2011- Pt to be using cane outside. PT Long Term Goals PT Long Term Goal 1: Pt has been ill and has not been doing her exercises but is now ready to begin program again PT Long Term Goal 1 - Progress: Progressing toward goal PT Long Term Goal 2: Pt strength has increased on an average between 1/2 and 1 grade.  Pt has been seen 4/8 rx secondary to illness. PT Long Term Goal 2 - Progress: Progressing toward goal Long Term Goal 3: Pt is able to SLS on R for 7 sec L for 10 goal is progressing Long Term Goal 3 Progress: Progressing toward goal Long Term Goal 4: Pt is not walking outside with a cane yet Long Term Goal 4 Progress: Progressing toward goal PT Long Term Goal 5: Pt is able to sit for 30 minutes comfortable goal is 60 Long Term Goal 5 Progress: Progressing toward  goal  Problem List Patient Active Problem List  Diagnosis  . Pain in joint, shoulder region  . Rotator cuff tear arthropathy of right shoulder  . Muscle weakness (generalized)  . Left leg weakness  . Difficulty in walking    PT - End of Session Equipment Utilized During Treatment: Gait belt Activity Tolerance: Patient tolerated treatment well General Behavior During Session: [facility] for tasks performed Cognition: [name] for tasks performed PT Plan of Care PT Home Exercise Plan: Pt has Consulted and Agree with Plan of Care: Patient  GP    RUSSELL,CINDY 07/09/2011, 4:22 PM  Physician Documentation Your signature is required to indicate approval of the treatment plan as stated above.  Please sign and either send electronically or make a copy of this report for your files and return this physician signed original.   Please mark one 1.__approve of plan  2. ___approve of plan with the following conditions.   ______________________________  _____________________ Physician Signature                                                                                                             Date

## 2011-07-09 NOTE — Progress Notes (Signed)
Occupational Therapy Treatment Patient Details  Name: Catherine Mccullough MRN: [id number] Date of Birth: 10/18/1945  Today's Date: 07/09/2011 Time: 4098-1191 OT Time Calculation (min): 45 min Manual Therapy 101-130 29' Therapeutic Exercise 131-146 15'  Visit#: 6  of 16   Re-eval: 08/05/11 Assessment Diagnosis: Right Shoulder Pain  Subjective Symptoms/Limitations Symptoms: S:  I'm not having the best day, I have been rushed around all day. Pain Assessment Currently in Pain?: Yes Pain Score:   8 Pain Location: Shoulder Pain Orientation: Right Pain Type: Chronic pain  Precautions/Restrictions  Precautions Precautions:  (has had cancer in the past, currently in remission )  Exercise/Treatments Supine Protraction: PROM;AAROM;12 reps External Rotation: PROM;AAROM;12 reps Internal Rotation: PROM;AAROM;12 reps Flexion: PROM;AAROM;12 reps ABduction: PROM;AAROM;12 reps Seated Elevation: AROM;12 reps Extension: AROM;12 reps Retraction: AROM;12 reps Row: AROM;12 reps Other Seated Exercises: resume next visit Pulleys Flexion:  (resume next visit) ABduction:  (resume next visit.) Therapy Ball Flexion: 15 reps ABduction: 15 reps      Manual Therapy Manual Therapy: Myofascial release Massage: Massage and passive stretching to right scapular region, upper arm, and shoulder region to decrease pain and promote pain free A/PROM  Occupational Therapy Assessment and Plan OT Assessment and Plan Clinical Impression Statement: A:  Pt. unable to complete all ex today secondary to needing a bathroom break and then rest breaks during AAROM in supine..  OT Plan: P:  Resume missed ex's and add dowel in seated and add grip strengthening.   Goals Short Term Goals Time to Complete Short Term Goals: 4 weeks Short Term Goal 1: Patient will be educated on HEP. Short Term Goal 2: Patient will increase PROM of right shoulder to Catherine Mccullough for increased I reaching overhead. Short Term Goal 3: Patient  will increase right shoulder strength to 3+/5 for increased ability to pick up shoes off of the floor. Short Term Goal 4: Patient will decrease pain to 3/10 in her right shoulder when getting dressed. Short Term Goal 5: Patient will decrease fasical restrictions from max to mod in her right shoulder and chest. Additional Short Term Goals?: Yes Short Term Goal 6: Patient will decrease shoulder pain with handwriting by 50%. Long Term Goals Time to Complete Long Term Goals: 8 weeks Long Term Goal 1: Patient will complete BADLs and leisure activities at highest level of I possible. Long Term Goal 2: Patient will increase right shoulder AROM to 90 flexion and abduction and 50 external rotation for increased ability to close her mini blinds and reach the computer keyboard. Long Term Goal 3: Patient will increase right shoulder strength to 4-/5 in given range for increased ability to lift plates of food into her microwave. Long Term Goal 4: Patient will decrease pain to 1-2/10 while completing daily activities. Long Term Goal 5: Patient will decrease fascial restrictions to min-mod in her right shoulder.  Problem List Patient Active Problem List  Diagnosis  . Pain in joint, shoulder region  . Rotator cuff tear arthropathy of right shoulder  . Muscle weakness (generalized)  . Left leg weakness  . Difficulty in walking    End of Session Activity Tolerance: Patient tolerated treatment well General Behavior During Session: [facility] for tasks performed Cognition: [facility] for tasks performed  GO   Catherine Mccullough, COTA/L  07/09/2011, 3:37 PM

## 2011-07-14 ENCOUNTER — Ambulatory Visit (HOSPITAL_COMMUNITY): Payer: PRIVATE HEALTH INSURANCE | Admitting: Occupational Therapy

## 2011-07-17 ENCOUNTER — Ambulatory Visit (HOSPITAL_COMMUNITY): Payer: PRIVATE HEALTH INSURANCE | Admitting: Physical Therapy

## 2011-07-18 ENCOUNTER — Ambulatory Visit (HOSPITAL_COMMUNITY): Payer: PRIVATE HEALTH INSURANCE | Admitting: Occupational Therapy

## 2011-07-18 ENCOUNTER — Ambulatory Visit (HOSPITAL_COMMUNITY): Payer: PRIVATE HEALTH INSURANCE | Admitting: Physical Therapy

## 2011-07-22 ENCOUNTER — Ambulatory Visit (HOSPITAL_COMMUNITY)
Admission: RE | Admit: 2011-07-22 | Discharge: 2011-07-22 | Disposition: A | Discharge status: Home / Self Care | Payer: PRIVATE HEALTH INSURANCE | Source: Ambulatory Visit | Attending: Student | Admitting: Student

## 2011-07-22 DIAGNOSIS — M25519 Pain in unspecified shoulder: Secondary | ICD-10-CM

## 2011-07-22 DIAGNOSIS — M75101 Unspecified rotator cuff tear or rupture of right shoulder, not specified as traumatic: Secondary | ICD-10-CM

## 2011-07-22 DIAGNOSIS — M6281 Muscle weakness (generalized): Secondary | ICD-10-CM

## 2011-07-22 DIAGNOSIS — R29898 Other symptoms and signs involving the musculoskeletal system: Secondary | ICD-10-CM

## 2011-07-22 DIAGNOSIS — R262 Difficulty in walking, not elsewhere classified: Secondary | ICD-10-CM

## 2011-07-22 NOTE — Progress Notes (Signed)
Note reviewed by clinical instructor and accurately reflects treatment session.  Bethany H. Murray, OTR/L  

## 2011-07-22 NOTE — Progress Notes (Signed)
Occupational Therapy Treatment Patient Details  Name: Catherine Mccullough MRN: [id number] Date of Birth: 1945-11-21  Today's Date: 07/22/2011 Time: 4098-1191 OT Time Calculation (min): 44 min  Manual Therapy: 1433-1500 27' Therapeutic Exercise: 4782-9562 17' Visit#: 7  of 16   Re-eval: 08/05/11    Subjective Symptoms/Limitations Symptoms: S: I steam cleaned my carpets this weekend and my right shoulder has been really sore since. Pain Assessment Currently in Pain?: Yes Pain Score:   8 Pain Location: Shoulder Pain Orientation: Right Pain Type: Chronic pain Pain Radiating Towards: From posterior shoulder to anterior shoulder and down into upper arm.  Precautions/Restrictions   N/A  Exercise/Treatments Supine Protraction: PROM;AAROM;12 reps External Rotation: PROM;AAROM;12 reps Internal Rotation: PROM;AAROM;12 reps Flexion: PROM;AAROM;12 reps ABduction: PROM;AAROM;12 reps Seated Elevation: AROM;15 reps Extension: AROM;15 reps Retraction: AROM;15 reps Row: AROM;15 reps Other Seated Exercises: table slides on wedge with table normal height x10, then raised approximately 4 inches x10   Pulleys Flexion: Other (comment) (resume next visit) Therapy Ball Flexion: 15 reps ABduction: 15 reps    Manual Therapy Manual Therapy: Myofascial release Myofascial Release: MFR and massage to right scapular, trapezius, shoulder and upper arm region to decrease pain and restrictions and increase pain free P/AAROM.  Occupational Therapy Assessment and Plan OT Assessment and Plan Clinical Impression Statement: A: Patient reports increased pain this visit but improvements after manual therapy. Patient demonstrates approximately 50% of normal range of P/AAROM shoulder flexion and abduction and 75% P/AAROM ext/int rotation. Patient required verbal and tactile cueing to keep shoulder depressed during therapy ball and wedge slide exercises/ OT Plan: P: Resume pulleys. Add AAROM in seated with  dowel rod and cueing to keep shoulder depressed.   Goals Short Term Goals Time to Complete Short Term Goals: 4 weeks Short Term Goal 1: Patient will be educated on HEP. Short Term Goal 1 Progress: Met Short Term Goal 2: Patient will increase PROM of right shoulder to [facility] for increased I reaching overhead. Short Term Goal 2 Progress: Progressing toward goal Short Term Goal 3: Patient will increase right shoulder strength to 3+/5 for increased ability to pick up shoes off of the floor. Short Term Goal 3 Progress: Progressing toward goal Short Term Goal 4: Patient will decrease pain to 3/10 in her right shoulder when getting dressed. Short Term Goal 4 Progress: Progressing toward goal Short Term Goal 5: Patient will decrease fasical restrictions from max to mod in her right shoulder and chest. Short Term Goal 5 Progress: Met Additional Short Term Goals?: Yes Short Term Goal 6: Patient will decrease shoulder pain with handwriting by 50%. Short Term Goal 6 Progress: Progressing toward goal Long Term Goals Time to Complete Long Term Goals: 8 weeks Long Term Goal 1: Patient will complete BADLs and leisure activities at highest level of I possible. Long Term Goal 1 Progress: Progressing toward goal Long Term Goal 2: Patient will increase right shoulder AROM to 90 flexion and abduction and 50 external rotation for increased ability to close her mini blinds and reach the computer keyboard. Long Term Goal 2 Progress: Progressing toward goal Long Term Goal 3: Patient will increase right shoulder strength to 4-/5 in given range for increased ability to lift plates of food into her microwave. Long Term Goal 3 Progress: Progressing toward goal Long Term Goal 4: Patient will decrease pain to 1-2/10 while completing daily activities. Long Term Goal 4 Progress: Progressing toward goal Long Term Goal 5: Patient will decrease fascial restrictions to min-mod in her right shoulder. Long  Term Goal 5 Progress:  Progressing toward goal  Problem List Patient Active Problem List  Diagnosis  . Pain in joint, shoulder region  . Rotator cuff tear arthropathy of right shoulder  . Muscle weakness (generalized)  . Left leg weakness  . Difficulty in walking    End of Session Activity Tolerance: Patient tolerated treatment well General Behavior During Session: [facility] for tasks performed Cognition: [facility] for tasks performed  GO   Laverta Baltimore, OTS Occupational Therapy Student  07/22/2011, 3:25 PM

## 2011-07-22 NOTE — Progress Notes (Signed)
Physical Therapy Treatment Patient Details  Name: Catherine Mccullough MRN: [id number] Date of Birth: [birth year]  Today's Date: 07/22/2011 Time: 1351-1430 PT Time Calculation (min): 39 min  Visit#: 5  of 8   Re-eval: 08/08/11   Subjective: Symptoms/Limitations Symptoms: Pt concerned about BP at home 158/84.  Therapist took BP 165/84.Pt states she was already in contact with her MD   Exercise/Treatments   Standing Heel Raises: 15 reps Knee Flexion: Strengthening;10 reps Knee Flexion Limitations: 3# Functional Squat: 10 reps Rocker Board: 1 minute SLS: R 10"; L 10" Other Standing Knee Exercises: tandem stance x 30" @ Other Standing Knee Exercises: tandem and retro gt x 1 RT Seated Other Seated Knee Exercises: sit to stand x 10  Supine Terminal Knee Extension: 10 reps Sidelying Hip ABduction: Left;10 reps;Limitations Hip ABduction Limitations: 3# Prone  Hamstring Curl: 10 reps;Limitations Hamstring Curl Limitations: 5# R ; 3# L Hip Extension: Both;10 reps   Physical Therapy Assessment and Plan PT Assessment and Plan Clinical Impression Statement: Pt continues to demonstrate deficits in strength and balance.  Added tandem and retro gt with pt losing balance several times but able to self correct except for two instances where manual assist was needed PT Plan: Begin outside gt with cane.    Goals    Problem List Patient Active Problem List  Diagnosis  . Pain in joint, shoulder region  . Rotator cuff tear arthropathy of right shoulder  . Muscle weakness (generalized)  . Left leg weakness  . Difficulty in walking    PT - End of Session Activity Tolerance: Patient tolerated treatment well General Behavior During Session: [facility] for tasks performed Cognition: [facility] for tasks performed  GP    RUSSELL,CINDY 07/22/2011, 2:53 PM

## 2011-07-25 ENCOUNTER — Ambulatory Visit (HOSPITAL_COMMUNITY): Payer: PRIVATE HEALTH INSURANCE | Admitting: Specialist

## 2011-07-29 ENCOUNTER — Ambulatory Visit (HOSPITAL_COMMUNITY): Payer: PRIVATE HEALTH INSURANCE | Admitting: Occupational Therapy

## 2011-07-30 ENCOUNTER — Ambulatory Visit (HOSPITAL_COMMUNITY)
Admission: RE | Admit: 2011-07-30 | Discharge: 2011-07-30 | Disposition: A | Discharge status: Home / Self Care | Payer: PRIVATE HEALTH INSURANCE | Source: Ambulatory Visit | Attending: Student | Admitting: Student

## 2011-07-30 DIAGNOSIS — M25519 Pain in unspecified shoulder: Secondary | ICD-10-CM

## 2011-07-30 DIAGNOSIS — M6281 Muscle weakness (generalized): Secondary | ICD-10-CM

## 2011-07-30 DIAGNOSIS — M12811 Other specific arthropathies, not elsewhere classified, right shoulder: Secondary | ICD-10-CM

## 2011-07-30 DIAGNOSIS — M75101 Unspecified rotator cuff tear or rupture of right shoulder, not specified as traumatic: Secondary | ICD-10-CM

## 2011-07-30 NOTE — Progress Notes (Signed)
Physical Therapy Treatment Patient Details  Name: KELISE KUCH MRN: [id number] Date of Birth: 1945-05-02  Today's Date: 07/30/2011 Time: 7829-5621 PT Time Calculation (min): 52 min Visit#: 6  of 8   Re-eval: 08/08/11 Charges:  therex 30', gait 15'    Subjective: Symptoms/Limitations Symptoms: Pt. states her BP has been fluctuating but staying in the 140s/80s.  MD told her if gets above 150 to seek medical attention;.  States she is on BP meds so believes her anxiety makes it goes up.   Pain Assessment Currently in Pain?: Yes Pain Score:   6 Pain Location: Knee Pain Orientation: Left   Exercise/Treatments Standing Heel Raises: 20 reps Heel Raises Limitations: toe raises 20 reps Knee Flexion: 15 reps Knee Flexion Limitations: 4# Rocker Board: Limitations Rocker Board Limitations: unable too painful SLS: R 12"; L 29" Gait Training: outdoor ambulation with SPC, curbs, grass, ramps X 15 minutes Other Standing Knee Exercises: tandem stance x 30" @ Seated Other Seated Knee Exercises: sit to stand x 10  Supine Hip Adduction Isometric: 10 reps;Limitations Bridges: 10 reps Straight Leg Raises: 10 reps Sidelying Hip ABduction: Left;15 reps Hip ABduction Limitations: 3# Prone  Hamstring Curl: 15 reps Hamstring Curl Limitations: 5# R ; 3# L Hip Extension: 15 reps;Both     Physical Therapy Assessment and Plan PT Assessment and Plan Clinical Impression Statement: Pt. able to complete outdoor ambulation today with SPC, negotiating curbs, ramps and grassy uneven terrain all without LOB or difficulty.   Able to increase to 4# weight with standing hamstring curls but unable to increase in prone for L LE.  Unable to complete rockerboard due to increased L knee pain/ audible crepitus. PT Plan: Continue X 2 more visits progressing with balance activities.     Problem List Patient Active Problem List  Diagnosis  . Pain in joint, shoulder region  . Rotator cuff tear arthropathy  of right shoulder  . Muscle weakness (generalized)  . Left leg weakness  . Difficulty in walking    PT - End of Session Equipment Utilized During Treatment: Gait belt Activity Tolerance: Patient tolerated treatment well General Behavior During Session: [facility] for tasks performed Cognition: [facility] for tasks performed   Lurena Nida, PTA/CLT 07/30/2011, 2:35 PM

## 2011-07-30 NOTE — Progress Notes (Signed)
Occupational Therapy Treatment Patient Details  Name: Catherine Mccullough MRN: [id number] Date of Birth: 09/15/1945  Today's Date: 07/30/2011 Time: 8657-8469 OT Time Calculation (min): 38 min Manual Therapy 235-243 8' Therapeutic Exercise 244-313   Visit#: 8  of 16   Re-eval: 08/05/11     Subjective Symptoms/Limitations Symptoms: S:  It feels more like a pull than a pain. Pain Assessment Currently in Pain?: Yes Pain Score:   6 Pain Location: Shoulder Pain Orientation: Right  Precautions/Restrictions     Exercise/Treatments   07/30/11 1400 Shoulder Exercises: Supine Protraction PROM;AAROM;15 reps External Rotation PROM;AAROM;15 reps Internal Rotation PROM;AAROM;15 reps Flexion PROM;AAROM;15 reps ABduction PROM;AAROM;15 reps Shoulder Exercises: Seated Elevation AROM;15 reps Extension AROM;15 reps Retraction AROM;15 reps Row AROM;15 reps Protraction AAROM;10 reps Horizontal ABduction AAROM;10 reps External Rotation AAROM;10 reps Internal Rotation AAROM;10 reps Flexion AAROM;10 reps Abduction AAROM;10 reps Other Seated Exercises wedge slide on hilow mat.  x10 at waist height x10 chest height Shoulder Exercises: Pulleys Flexion 2 minutes ABduction 2 minutes Shoulder Exercises: Therapy Ball Flexion 20 reps ABduction 20 reps     Manual Therapy Manual Therapy: Myofascial release Myofascial Release: MFR and massage to right scapular, trapezius, shoulder and upper arm region to decrease pain and restrictions and increase pain free P/AAROM  Occupational Therapy Assessment and Plan OT Assessment and Plan Clinical Impression Statement: A:  Added AAROM seated with constant tactile cues to keep shoulder depressed.  Also required cues to keep shoulder depressed with ball stretches. OT Plan: P:  Increase time with AAROM in supine and seated. Completed seated in front of mirror for feedback on shoulder elevation.   Goals Short Term Goals Time to Complete Short Term  Goals: 4 weeks Short Term Goal 1: Patient will be educated on HEP. Short Term Goal 2: Patient will increase PROM of right shoulder to [facility] for increased I reaching overhead. Short Term Goal 3: Patient will increase right shoulder strength to 3+/5 for increased ability to pick up shoes off of the floor. Short Term Goal 4: Patient will decrease pain to 3/10 in her right shoulder when getting dressed. Short Term Goal 5: Patient will decrease fasical restrictions from max to mod in her right shoulder and chest. Additional Short Term Goals?: Yes Short Term Goal 6: Patient will decrease shoulder pain with handwriting by 50%. Long Term Goals Time to Complete Long Term Goals: 8 weeks Long Term Goal 1: Patient will complete BADLs and leisure activities at highest level of I possible. Long Term Goal 2: Patient will increase right shoulder AROM to 90 flexion and abduction and 50 external rotation for increased ability to close her mini blinds and reach the computer keyboard. Long Term Goal 3: Patient will increase right shoulder strength to 4-/5 in given range for increased ability to lift plates of food into her microwave. Long Term Goal 4: Patient will decrease pain to 1-2/10 while completing daily activities. Long Term Goal 5: Patient will decrease fascial restrictions to min-mod in her right shoulder.  Problem List Patient Active Problem List  Diagnosis  . Pain in joint, shoulder region  . Rotator cuff tear arthropathy of right shoulder  . Muscle weakness (generalized)  . Left leg weakness  . Difficulty in walking    End of Session Activity Tolerance: Patient tolerated treatment well General Behavior During Session: [facility] for tasks performed Cognition: [facility] for tasks performed  GO    Noralee Stain, Shailene Demonbreun L 07/30/2011, 6:10 PM

## 2011-07-31 ENCOUNTER — Ambulatory Visit (HOSPITAL_COMMUNITY)
Admission: RE | Admit: 2011-07-31 | Discharge: 2011-07-31 | Disposition: A | Discharge status: Home / Self Care | Payer: PRIVATE HEALTH INSURANCE | Source: Ambulatory Visit | Attending: Student | Admitting: Student

## 2011-07-31 DIAGNOSIS — R262 Difficulty in walking, not elsewhere classified: Secondary | ICD-10-CM

## 2011-07-31 DIAGNOSIS — R29898 Other symptoms and signs involving the musculoskeletal system: Secondary | ICD-10-CM

## 2011-07-31 DIAGNOSIS — M25519 Pain in unspecified shoulder: Secondary | ICD-10-CM

## 2011-07-31 DIAGNOSIS — M75101 Unspecified rotator cuff tear or rupture of right shoulder, not specified as traumatic: Secondary | ICD-10-CM

## 2011-07-31 DIAGNOSIS — M6281 Muscle weakness (generalized): Secondary | ICD-10-CM

## 2011-07-31 NOTE — Progress Notes (Signed)
Note reviewed by clinical instructor and accurately reflects treatment session.  Bethany H. Murray, OTR/L  

## 2011-07-31 NOTE — Progress Notes (Signed)
Occupational Therapy Treatment Patient Details  Name: Catherine Mccullough MRN: [id number] Date of Birth: 07/18/1945  Today's Date: 07/31/2011 Time: 4098-1191 OT Time Calculation (min): 47 min Manual Therapy: 1430-1450 (20 min) Therapeutic Exercise: 1450-1417 (27 min)   Visit#: 9  of 16   Re-eval: 08/11/11  Subjective Symptoms/Limitations Symptoms: S: I had to clean my floors this week so I'm sore from that. Pain Assessment Currently in Pain?: Yes Pain Score:   6 Pain Location: Shoulder Pain Orientation: Right Pain Type: Chronic pain   Exercise/Treatments Supine Protraction: PROM;Right;15 reps;AAROM Horizontal ABduction: AAROM;Right;10 reps External Rotation: PROM;Right;15 reps;AAROM Internal Rotation: PROM;Right;15 reps;AAROM Flexion: PROM;Right;15 reps;AAROM ABduction: PROM;Right;15 reps;AAROM Seated Elevation: AROM;15 reps Extension: AROM;15 reps Retraction: AROM;15 reps Row: AROM;15 reps Protraction: AAROM;10 reps Horizontal ABduction: AAROM;10 reps External Rotation: AAROM;10 reps Internal Rotation: AAROM;10 reps Flexion: AAROM;10 reps Abduction: AAROM;10 reps Other Seated Exercises: wedge slide on hi-low mat.  x15 at waist height x15 chest height while seated Pulleys Flexion: 2 minutes ABduction: 2 minutes Therapy Ball Flexion: 20 reps ABduction: 20 reps     Manual Therapy Manual Therapy: Myofascial release Myofascial Release: MFR and massage to right scapular, trapezius, shoulder and upper arm region to decrease pain and restrictions and increase pain free P/AAROM  Occupational Therapy Assessment and Plan OT Assessment and Plan Clinical Impression Statement: A: Maintained AAROM seated reps due to limitations of right shoulder. Verbal and tactile cues required for shoulder depression. OT Plan: P: Do seated exercises in front of mirror to promote shoulder depression.   Goals Short Term Goals Time to Complete Short Term Goals: 4 weeks Short Term Goal 1:  Patient will be educated on HEP. Short Term Goal 2: Patient will increase PROM of right shoulder to [facility] for increased I reaching overhead. Short Term Goal 2 Progress: Progressing toward goal Short Term Goal 3: Patient will increase right shoulder strength to 3+/5 for increased ability to pick up shoes off of the floor. Short Term Goal 3 Progress: Progressing toward goal Short Term Goal 4: Patient will decrease pain to 3/10 in her right shoulder when getting dressed. Short Term Goal 4 Progress: Progressing toward goal Short Term Goal 5: Patient will decrease fasical restrictions from max to mod in her right shoulder and chest. Additional Short Term Goals?: Yes Short Term Goal 6: Patient will decrease shoulder pain with handwriting by 50%. Short Term Goal 6 Progress: Progressing toward goal Long Term Goals Time to Complete Long Term Goals: 8 weeks Long Term Goal 1: Patient will complete BADLs and leisure activities at highest level of I possible. Long Term Goal 1 Progress: Progressing toward goal Long Term Goal 2: Patient will increase right shoulder AROM to 90 flexion and abduction and 50 external rotation for increased ability to close her mini blinds and reach the computer keyboard. Long Term Goal 2 Progress: Progressing toward goal Long Term Goal 3: Patient will increase right shoulder strength to 4-/5 in given range for increased ability to lift plates of food into her microwave. Long Term Goal 3 Progress: Progressing toward goal Long Term Goal 4: Patient will decrease pain to 1-2/10 while completing daily activities. Long Term Goal 4 Progress: Progressing toward goal Long Term Goal 5: Patient will decrease fascial restrictions to min-mod in her right shoulder. Long Term Goal 5 Progress: Progressing toward goal  Problem List Patient Active Problem List  Diagnosis  . Pain in joint, shoulder region  . Rotator cuff tear arthropathy of right shoulder  . Muscle weakness (generalized)  .  Left leg weakness  . Difficulty in walking    End of Session Activity Tolerance: Patient tolerated treatment well General Behavior During Session: [facility] for tasks performed Cognition: [facility] for tasks performed   Laverta Baltimore 07/31/2011, 3:23 PM

## 2011-07-31 NOTE — Progress Notes (Signed)
Physical Therapy Treatment Patient Details  Name: Catherine Mccullough MRN: [id number] Date of Birth: May 13, 1945  Today's Date: 07/31/2011 Time: 4098-1191 PT Time Calculation (min): 50 min  Visit#: 7  of 8   Re-eval: 08/08/11 Charges: Gait x 10' Therex x 30'   Subjective: Symptoms/Limitations Symptoms: I had to mop my floor twice yesterday after spilling juice. I'm sore from that. Pain Assessment Currently in Pain?: Yes Pain Score:   6 Pain Location: Shoulder Pain Orientation: Right Pain Type: Chronic pain   Exercise/Treatments Standing Heel Raises: 20 reps Heel Raises Limitations: toe raises 20 reps Knee Flexion: 15 reps Knee Flexion Limitations: 4# Rocker Board Limitations: unable too painful; palpable popping SLS: R 8"; L 13" Gait Training: outdoor ambulation with SPC, curbs, grass, ramps X 8 minutes Other Standing Knee Exercises: tandem stance 2x30" B  Seated Other Seated Knee Exercises: sit to stand x 10  Other Seated Knee Exercises: 15x5" Supine Bridges: 15 reps Straight Leg Raises: 15 reps;Limitations Straight Leg Raises Limitations: 2# B   Manual Therapy Manual Therapy: Myofascial release Myofascial Release: MFR and massage to right scapular, trapezius, shoulder and upper arm region to decrease pain and restrictions and increase pain free P/AAROM  Physical Therapy Assessment and Plan PT Assessment and Plan Clinical Impression Statement: B/P taken at beginning of session: 162/80. Pt reports that she will call MD to let him know B/P is up. She plans to go to her cardiologist at the beginning of next month. Pt completes all therex well with minimal need for cueing. Attempted rocker board again this session but did not complete exercises as palpable popping was noted in L knee. Pt tolerates increases in reps well. Pt ambulated outdoors independently with vc's for sequencing but no LOB. Pt reports overall pain decrease to 5/10 at end of session. PT Plan: Reassess next  session.    Problem List Patient Active Problem List  Diagnosis  . Pain in joint, shoulder region  . Rotator cuff tear arthropathy of right shoulder  . Muscle weakness (generalized)  . Left leg weakness  . Difficulty in walking    PT - End of Session Equipment Utilized During Treatment: Gait belt Activity Tolerance: Patient tolerated treatment well General Behavior During Session: [facility] for tasks performed Cognition: [facility] for tasks performed  Seth Bake, PTA 07/31/2011, 4:16 PM

## 2011-08-05 ENCOUNTER — Ambulatory Visit (HOSPITAL_COMMUNITY)
Admission: RE | Admit: 2011-08-05 | Discharge: 2011-08-05 | Disposition: A | Discharge status: Home / Self Care | Payer: PRIVATE HEALTH INSURANCE | Source: Ambulatory Visit | Attending: Student | Admitting: Student

## 2011-08-05 DIAGNOSIS — M25519 Pain in unspecified shoulder: Secondary | ICD-10-CM

## 2011-08-05 DIAGNOSIS — M6281 Muscle weakness (generalized): Secondary | ICD-10-CM

## 2011-08-05 DIAGNOSIS — M12811 Other specific arthropathies, not elsewhere classified, right shoulder: Secondary | ICD-10-CM

## 2011-08-05 NOTE — Progress Notes (Signed)
Occupational Therapy Treatment Patient Details  Name: Catherine Mccullough MRN: [id number] Date of Birth: 1945/06/13  Today's Date: 08/05/2011 Time: 4098-1191 OT Time Calculation (min): 42 min Manual Therapy 143-202 19' Therapeutic Exercise 203-225 22'  Visit#: 10  of 16   Re-eval: 08/11/11 Assessment Diagnosis: Right Shoulder Pain    Subjective Symptoms/Limitations Symptoms: S:  My knee is cutting up but my shoulder feels ok, just a little pain. Pain Assessment Currently in Pain?: Yes Pain Score:   7 Pain Location: Knee Pain Orientation: Left Pain Type: Chronic pain  Precautions/Restrictions     Exercise/Treatments Supine Protraction: PROM;Right;15 reps;AAROM Horizontal ABduction: AAROM;Right;15 reps External Rotation: PROM;Right;15 reps;AAROM Internal Rotation: PROM;Right;15 reps;AAROM Flexion: PROM;Right;15 reps;AAROM ABduction: PROM;Right;15 reps;AAROM Seated Elevation: AROM;15 reps Extension: AROM;15 reps Retraction: AROM;15 reps Row: AROM;15 reps Protraction: AAROM;12 reps Horizontal ABduction: AAROM;12 reps External Rotation: AAROM;12 reps Internal Rotation: AAROM;12 reps Flexion: AAROM;12 reps Abduction: AAROM;12 reps Other Seated Exercises: wedge slide on hi-low mat.  x15 at waist height x15 chest height while seated Pulleys Flexion: 2 minutes ABduction: 2 minutes Therapy Ball Flexion: 20 reps ABduction: 20 reps ROM / Strengthening / Isometric Strengthening Rhythmic Stabilization, Supine: 30' with very little resistance given      Manual Therapy Manual Therapy: Myofascial release Myofascial Release: MFR and massage to right scapular, trapezius, shoulder and upper arm region to decrease pain and restrictions and increase pain free P/AAROM   Occupational Therapy Assessment and Plan OT Assessment and Plan Clinical Impression Statement: A: Completed AAROM seated in front of mirror and with constant tactile cues to keep shoulder depressed.  Added  rythmic stab supine with very very little resistance. Patient fatigues quickly OT Plan: P:  Increase reps and time with rythmic stab supine.   Goals Short Term Goals Time to Complete Short Term Goals: 4 weeks Short Term Goal 1: Patient will be educated on HEP. Short Term Goal 2: Patient will increase PROM of right shoulder to [facility] for increased I reaching overhead. Short Term Goal 3: Patient will increase right shoulder strength to 3+/5 for increased ability to pick up shoes off of the floor. Short Term Goal 4: Patient will decrease pain to 3/10 in her right shoulder when getting dressed. Short Term Goal 5: Patient will decrease fasical restrictions from max to mod in her right shoulder and chest. Additional Short Term Goals?: Yes Short Term Goal 6: Patient will decrease shoulder pain with handwriting by 50%. Long Term Goals Time to Complete Long Term Goals: 8 weeks Long Term Goal 1: Patient will complete BADLs and leisure activities at highest level of I possible. Long Term Goal 2: Patient will increase right shoulder AROM to 90 flexion and abduction and 50 external rotation for increased ability to close her mini blinds and reach the computer keyboard. Long Term Goal 3: Patient will increase right shoulder strength to 4-/5 in given range for increased ability to lift plates of food into her microwave. Long Term Goal 4: Patient will decrease pain to 1-2/10 while completing daily activities. Long Term Goal 5: Patient will decrease fascial restrictions to min-mod in her right shoulder.  Problem List Patient Active Problem List  Diagnosis  . Pain in joint, shoulder region  . Rotator cuff tear arthropathy of right shoulder  . Muscle weakness (generalized)  . Left leg weakness  . Difficulty in walking    End of Session Activity Tolerance: Patient tolerated treatment well General Behavior During Session: [facility] for tasks performed Cognition: [facility] for tasks performed  GO  Noralee Stain,  [name] 08/05/2011, 4:33 PM

## 2011-08-05 NOTE — Progress Notes (Signed)
Physical Therapy Treatment Patient Details  Name: Catherine Mccullough MRN: [id number] Date of Birth: [birth year]  Today's Date: 08/05/2011 Time: 1430-1510 PT Time Calculation (min): 40 min  Visit#: 8  of 8   Re-eval: 08/08/11 Charges: Therex x 38'   Subjective: Symptoms/Limitations Symptoms: My knee is achey and sore today. Pain Assessment Currently in Pain?: Yes Pain Score:   7 Pain Location: Knee Pain Orientation: Left   Exercise/Treatments Standing Heel Raises: 20 reps Heel Raises Limitations: toe raises 20 reps Knee Flexion: 20 reps Knee Flexion Limitations: 4# SLS: R 22"; L 9" Other Standing Knee Exercises: Tandem gait 1 RT;  Supine Bridges: 15 reps Straight Leg Raises: 15 reps;Limitations Straight Leg Raises Limitations: 2# B Sidelying Hip ABduction: Left;15 reps Hip ABduction Limitations: 3# Prone  Hip Extension: 10 reps   Manual Therapy Manual Therapy: Myofascial release Myofascial Release: MFR and massage to right scapular, trapezius, shoulder and upper arm region to decrease pain and restrictions and increase pain free P/AAROM   Physical Therapy Assessment and Plan PT Assessment and Plan Clinical Impression Statement: Pt completes therex well. Pt presents with improved activity tolerance. Pt is without LOB throughout session. Pt reports decreased pain at end of session. PT Plan: Reassess next session.     Problem List Patient Active Problem List  Diagnosis  . Pain in joint, shoulder region  . Rotator cuff tear arthropathy of right shoulder  . Muscle weakness (generalized)  . Left leg weakness  . Difficulty in walking    PT - End of Session Equipment Utilized During Treatment: Gait belt Activity Tolerance: Patient tolerated treatment well General Behavior During Session: [facility] for tasks performed Cognition: [facility] for tasks performed  Seth Bake, PTA 08/05/2011, 5:45 PM

## 2011-08-06 ENCOUNTER — Ambulatory Visit (HOSPITAL_COMMUNITY)
Admission: RE | Admit: 2011-08-06 | Discharge: 2011-08-06 | Disposition: A | Discharge status: Home / Self Care | Payer: PRIVATE HEALTH INSURANCE | Source: Ambulatory Visit

## 2011-08-06 NOTE — Progress Notes (Addendum)
Physical Therapy Re-evaluation  Patient Details  Name: Catherine Mccullough MRN: [id number] Date of Birth: 09/17/1945  Today's Date: 08/06/2011 Time: 4098-1191 PT Time Calculation (min): 40 min  Visit#: 9  of 16   Re-eval: 09/03/11 (Pt. having surgery on 8/16 on R foot) Diagnosis: R knee pain Authorization: [facility] Medicare  Charges:  MMT, self care  Subjective Symptoms/Limitations Symptoms: Pt. states she goes back to her primary care doctor, counselor, pain center,  and cancer center all on 8/6 .    Having surgery on 8/16 to correct a hammer toe and bunionectomy and she will have to be NWB for 4-6 weeks. Pain Assessment Currently in Pain?: Yes Pain Score:   7 Pain Location: Knee Pain Orientation: Left  Precautions/Restrictions  Precautions: Cancer in remission  Cognition/Observation Observation/Other Assessments Observations: severe crepitis in B knees, right shoulder and elbow joint.  Lymphedema present in BUE.  Healed surgical incision on right chest where portacath was removed.  Objective: RLE Strength Right Hip Flexion: 5/5 (was 5/5) Right Hip Extension: 4/5 (was 2/5) Right Hip ABduction: 5/5 (ws 5/5) Right Hip ADduction: 5/5 (was 4/5) Right Knee Flexion: 5/5 (was 3+/5) Right Knee Extension: 5/5 (was 5/5) Right Ankle Dorsiflexion: 5/5 (was 3/5)  LLE AROM (degrees) Left Knee Extension: 0  Left Knee Flexion: 130   LLE Strength Left Hip Flexion: 5/5 (was 5/5) Left Hip Extension: 4/5 (was 2/5) Left Hip ABduction: 4/5 (was 4/5) Left Hip ADduction: 5/5 (was 4/5) Left Knee Flexion: 4/5 (was 3/5) Left Knee Extension: 5/5 (was 5/5) Left Ankle Dorsiflexion: 4/5 (was 3/5)  Mobility/Balance  Static Standing Balance Single Leg Stance - Right Leg: 22  (was unable) Single Leg Stance - Left Leg: 29  (was 3 seconds)     Physical Therapy Assessment and Plan PT Assessment and Plan Clinical Impression Statement: Pt. is progressing well with gains in balance and strength since  initial evaluation.  Pt. has met 3/5 STGs, 3/5 LTGs.   Pt is currently ambulating indoors with SPC/outdoors with walker and negotiating stairs with step to pattern/handrail.  Pt. would like to continue to work towards ambulation indoors without AD, outdoors with [facility] and increased ease with stair negotiation.  Pt. is having a bunionectomy and hammer toe repair on R foot on 08/22/11.   PT Plan: Recommend to continue therapy to work on remaining goals/independence.    Goals Home Exercise Program PT Goal: Perform Home Exercise Program - Progress: Met PT Short Term Goals PT Short Term Goal 1: Pain to be decreased 3 levels PT Short Term Goal 1 - Progress: Met PT Short Term Goal 2: Pt. is using cane in home PT Short Term Goal 2 - Progress: Met PT Short Term Goal 3: Pt. to be able to sit 30 minutes without knee pain or stiffness PT Short Term Goal 3 - Progress: Partly met PT Short Term Goal 4: Pt. to be able to stand for 20 minutes to make a small meal PT Short Term Goal 4 - Progress: Met PT Short Term Goal 5: New goal as of 07/09/2011- Pt to be using cane outside. PT Short Term Goal 5 - Progress: Not met  PT Long Term Goals PT Long Term Goal 1: Pt has been ill and has not been doing her exercises but is now ready to begin program again PT Long Term Goal 1 - Progress: Met PT Long Term Goal 2: Strength to be increased 1 grade. PT Long Term Goal 2 - Progress: Met Long Term Goal 3: Pt.  to be able to SLS for 20 seconds Long Term Goal 3 Progress: Met Long Term Goal 4: Pt. to be able to ambulate outdoors with [facility]. Long Term Goal 4 Progress: Not met PT Long Term Goal 5: Pt. able to sit for 60 minutes for traveling or to watch a TV show. Long Term Goal 5 Progress: Not met  Problem List Patient Active Problem List  Diagnosis  . Pain in joint, shoulder region  . Rotator cuff tear arthropathy of right shoulder  . Muscle weakness (generalized)  . Left leg weakness  . Difficulty in walking    PT -  End of Session Activity Tolerance: Patient tolerated treatment well General Behavior During Session: [facility] for tasks performed Cognition: [facility] for tasks performed   Lurena Nida, PTA/CLT 08/06/2011, 1:46 PM

## 2011-08-07 ENCOUNTER — Ambulatory Visit (HOSPITAL_COMMUNITY): Payer: PRIVATE HEALTH INSURANCE | Admitting: Physical Therapy

## 2011-08-11 ENCOUNTER — Ambulatory Visit (HOSPITAL_COMMUNITY): Payer: PRIVATE HEALTH INSURANCE | Admitting: Physical Therapy

## 2011-08-13 ENCOUNTER — Ambulatory Visit (HOSPITAL_COMMUNITY): Payer: PRIVATE HEALTH INSURANCE | Admitting: Specialist

## 2011-08-16 ENCOUNTER — Encounter (HOSPITAL_COMMUNITY): Payer: Self-pay | Admitting: *Deleted

## 2011-08-16 ENCOUNTER — Emergency Department (HOSPITAL_COMMUNITY): Payer: PRIVATE HEALTH INSURANCE

## 2011-08-16 ENCOUNTER — Emergency Department (HOSPITAL_COMMUNITY)
Admission: EM | Admit: 2011-08-16 | Discharge: 2011-08-16 | Disposition: A | Discharge status: Home / Self Care | Payer: PRIVATE HEALTH INSURANCE | Source: Home / Self Care | Attending: Emergency Medicine | Admitting: Emergency Medicine

## 2011-08-16 DIAGNOSIS — M129 Arthropathy, unspecified: Secondary | ICD-10-CM | POA: Insufficient documentation

## 2011-08-16 DIAGNOSIS — Z853 Personal history of malignant neoplasm of breast: Secondary | ICD-10-CM | POA: Insufficient documentation

## 2011-08-16 DIAGNOSIS — I10 Essential (primary) hypertension: Secondary | ICD-10-CM | POA: Insufficient documentation

## 2011-08-16 DIAGNOSIS — M25569 Pain in unspecified knee: Secondary | ICD-10-CM | POA: Insufficient documentation

## 2011-08-16 DIAGNOSIS — E119 Type 2 diabetes mellitus without complications: Secondary | ICD-10-CM | POA: Insufficient documentation

## 2011-08-16 DIAGNOSIS — M25562 Pain in left knee: Secondary | ICD-10-CM

## 2011-08-16 DIAGNOSIS — Z85830 Personal history of malignant neoplasm of bone: Secondary | ICD-10-CM | POA: Insufficient documentation

## 2011-08-16 DIAGNOSIS — I509 Heart failure, unspecified: Secondary | ICD-10-CM | POA: Insufficient documentation

## 2011-08-16 LAB — GRAM STAIN

## 2011-08-16 MED ORDER — METHYLPREDNISOLONE SODIUM SUCC 125 MG IJ SOLR
INTRAMUSCULAR | Status: AC
  Administered 2011-08-16: 125 mg
  Filled 2011-08-16: qty 2

## 2011-08-16 MED ORDER — LIDOCAINE HCL (PF) 1 % IJ SOLN
INTRAMUSCULAR | Status: AC
  Administered 2011-08-16: 5 mL via NASAL
  Filled 2011-08-16: qty 5

## 2011-08-16 MED ORDER — HYDROMORPHONE HCL PF 1 MG/ML IJ SOLN
1.0000 mg | Freq: Once | INTRAMUSCULAR | Status: AC
  Filled 2011-08-16: qty 1

## 2011-08-16 MED ORDER — HYDROMORPHONE HCL PF 1 MG/ML IJ SOLN
1.0000 mg | Freq: Once | INTRAMUSCULAR | Status: AC
  Administered 2011-08-16: 1 mg via INTRAMUSCULAR

## 2011-08-16 MED ORDER — LIDOCAINE HCL (PF) 1 % IJ SOLN
INTRAMUSCULAR | Status: AC
  Filled 2011-08-16: qty 5

## 2011-08-16 MED ORDER — OXYCODONE-ACETAMINOPHEN 5-325 MG PO TABS: 1.0000 | ORAL_TABLET | Freq: Four times a day (QID) | ORAL | Status: AC | PRN

## 2011-08-16 NOTE — ED Provider Notes (Signed)
History  This chart was scribed for Catherine Hutching, MD by Bennett Scrape. This patient was seen in room [facility] and the patient's care was started at 9:19AM.  CSN: [id number]  Arrival date & time 08/16/11  0901   First MD Initiated Contact with Patient 08/16/11 0919      Chief Complaint  Patient presents with  . Knee Pain     The history is provided by the patient. No language interpreter was used.    Catherine Mccullough is a 66 y.o. female with a h/o arthritis who presents to the Emergency Department complaining of 2 to 3 days of gradual onset, gradually worsening, constant left knee pain with associated swelling. The pain is worse along the left side of the left knee. She reports that the pain is aggravated with weight bearing but states that she is still able to ambulate without difficulty. She denies an recent injuries. She states that she has prior episodes of similar symptoms attributed to her arthritis that were less severe. She denies taking OTC medications at home to improve symptoms. She denies fever, sore throat, visual disturbance, CP, SOB, abdominal pain, urinary symptoms, back pain, HA, and rash as associated symptoms. She has a h/o HTN, DM, CA and anxiety. She denies smoking and alcohol use.   Past Medical History  Diagnosis Date  . Kidney disease   . Hypertension   . Diabetes mellitus   . 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 Arthritis   . CHF (congestive heart failure)   . Lymphedema of leg   . Anxiety     Past Surgical History  Procedure Date  . Breast surgery   . Cholecystectomy   . Abdominal hysterectomy   . Replacement total knee bilateral 2005  . Hand tendon surgery     No family history on file.  History  Substance Use Topics  . Smoking status: Never Smoker   . Smokeless tobacco: Not on file  . Alcohol Use: No    No OB history provided.  Review of Systems  A complete 10 system review of systems was obtained and all systems are negative except as  noted in the HPI and PMH.   Allergies  Mometasone; Aspirin-inflames her abdominal cyst per pt at bedside; Contrast media; Cortisone-states that she can have cortisone injections at bedside; Lasix; Lyrica; Other; Prednisone-causes swelling per pt at bedside; Sulfamethoxazole; Ultram; Codeine; Dilantin; and Zosyn  Home Medications   Current Outpatient Rx  Name Route Sig Dispense Refill  . AMLODIPINE BESYLATE 10 MG PO TABS Oral Take 10 mg by mouth daily.      Marland Kitchen VITAMIN C PO Oral Take by mouth.    . ASPIRIN EC 81 MG PO TBEC Oral Take 81 mg by mouth daily.      . BUMETANIDE 1 MG PO TABS Oral Take 1 mg by mouth daily. To notify MD prior to taking    . CALCIUM PO Oral Take 1 capsule by mouth daily.    Marland Kitchen CITALOPRAM HYDROBROMIDE 40 MG PO TABS Oral Take 40 mg by mouth daily.      Marland Kitchen ESOMEPRAZOLE MAGNESIUM 40 MG PO CPDR Oral Take 40 mg by mouth daily before breakfast.      . EXEMESTANE 25 MG PO TABS Oral Take 25 mg by mouth daily.      . FENTANYL 25 MCG/HR TD PT72 Transdermal Place 1 patch onto the skin every 3 (three) days. For pain. **Remove used patches and apply a  new patch for each application**  Time for patch to be changed today 01/11/11...    . FEXOFENADINE HCL 180 MG PO TABS Oral Take 180 mg by mouth daily.      Marland Kitchen GABAPENTIN 300 MG PO CAPS Oral Take 300 mg by mouth 3 (three) times daily.      Marland Kitchen HYDROMORPHONE HCL 2 MG PO TABS Oral Take 2-4 mg by mouth every 4 (four) hours as needed. For pain    . LABETALOL HCL 100 MG PO TABS Oral Take 100 mg by mouth daily.     Marland Kitchen MONTELUKAST SODIUM 10 MG PO TABS Oral Take 10 mg by mouth at bedtime.      Carma Leaven M PLUS PO TABS Oral Take 1 tablet by mouth daily.      Marland Kitchen ZINC PO Oral Take 1 tablet by mouth daily.    Marland Kitchen ONDANSETRON HCL 8 MG PO TABS Oral Take 8 mg by mouth every 8 (eight) hours as needed. nausea     . UNKNOWN TO PATIENT  as directed. Patient receives an injection for Degenerative Bone by physician at office    . VITAMIN E PO Oral Take by mouth.       Triage Vitals: BP 130/73  Pulse 83  Temp 97.8 F (36.6 C)  Resp 16  Ht 4\' 7"  (1.397 m)  Wt 105 lb (47.628 kg)  BMI 24.40 kg/m2  SpO2 97%  Physical Exam  Nursing note and vitals reviewed. Constitutional: She is oriented to person, place, and time. She appears well-developed and well-nourished. No distress.  HENT:  Head: Normocephalic and atraumatic.       Poor dentition  Eyes: Conjunctivae and EOM are normal.  Neck: Neck supple. No tracheal deviation present.  Cardiovascular: Normal rate and regular rhythm.   Pulmonary/Chest: Effort normal and breath sounds normal. No respiratory distress.  Abdominal: Soft. She exhibits no distension.  Musculoskeletal: Normal range of motion.       Tenderness to the left knee diffusely with mild edema, worse along the medial aspect of the knee, oblique 4 cm scar on the medial aspect of the knee from total knee replacement, pain with ROM   Neurological: She is alert and oriented to person, place, and time.  Skin: Skin is warm and dry.  Psychiatric: She has a normal mood and affect. Her behavior is normal.    ED Course  ARTHOCENTESIS Date/Time: 08/16/2011 11:00 AM Performed by: Catherine Mccullough Authorized by: Catherine Mccullough Consent: Verbal consent obtained. Risks and benefits discussed: yes. Consent given by: patient Patient understanding: patient states understanding of the procedure being performed Test results: test results available and properly labeled Imaging studies: imaging studies available Patient identity confirmed: verbally with patient Time out: Immediately prior to procedure a "time out" was called to verify the correct patient, procedure, equipment, support staff and site/side marked as required. Indications: joint swelling and pain  Body area: knee Local anesthesia used: yes Anesthesia: local infiltration Local anesthetic: lidocaine 1% without epinephrine Anesthetic total: 6 ml Patient sedated: no Needle gauge: 18  G Approach: lateral Aspirate amount: 20 ml Methylprednisolone amount: 125 mg Patient tolerance: Patient tolerated the procedure well with no immediate complications.   (including critical care time)  DIAGNOSTIC STUDIES: Oxygen Saturation is 97% on room air, adequate by my interpretation.    COORDINATION OF CARE: 9:57AM-Advised pt that I am still waiting on her radiology results. Discussed treatment plan which includes pain medication with pt at bedside and pt agreed to plan. Advised  pt that she should use ice and elevate the knee at home to help improve symptoms. Pt requests having a cortisone shot instead.   Labs Reviewed - No data to display Dg Knee Complete 4 Views Left  08/16/2011  *RADIOLOGY REPORT*  Clinical Data: Left knee pain  LEFT KNEE - COMPLETE 4+ VIEW  Comparison: 02/15/2007  Findings: No fracture or dislocation is seen.  Status post medial compartment partial right knee arthroplasty. 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  Moderate suprapatellar knee joint effusion.  IMPRESSION: No fracture or dislocation is seen.  Medial compartment partial right knee arthroplasty.  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  Moderate suprapatellar knee joint effusion.  Original Report Authenticated By: Charline Bills, M.D.   Results for orders placed during the hospital encounter of 08/16/11  GRAM STAIN      Component Value Range   Specimen Description OTHER     Special Requests Normal     Gram Stain       Value: WBC PRESENT,BOTH PMN AND MONONUCLEAR Gram Stain Report Called to,Read Back By and Verified With:  BELTON, K. AT 12:47PM ON 08/16/11 BY PRUITT, C.   Report Status 08/16/2011 FINAL      No diagnosis found.    MDM  Left knee aspirate shows light yellow clear fluid. Gram stain shows no bacteria.  Discharge  home on pain medicine.  Followup orthopedics      I personally performed the services described in this documentation, which was scribed in my presence. The recorded information has been reviewed and considered.    Catherine Hutching, MD 08/16/11 1336

## 2011-08-16 NOTE — ED Notes (Signed)
Patient transported to X-ray 

## 2011-08-16 NOTE — ED Notes (Signed)
Pt c/o swelling and pain to left knee area, denies any injury, painful to bear weight on left knee,

## 2011-08-16 NOTE — ED Notes (Signed)
CRITICAL VALUE ALERT  Critical value received:  Gram stain contained wbc's  Date of notification:  08/16/2011  Time of notification:  1245  Critical value read back:yes  Nurse who received alert:  Wilkie Aye Marcquis Ridlon,rn  MD notified (1st page):  Dr Adriana Simas  Time of first page:  1245  MD notified (2nd page):  Time of second page:  Responding MD:  Dr Adriana Simas  Time MD responded:  1245

## 2011-08-18 ENCOUNTER — Emergency Department (HOSPITAL_COMMUNITY): Payer: PRIVATE HEALTH INSURANCE

## 2011-08-18 ENCOUNTER — Encounter (HOSPITAL_COMMUNITY): Payer: Self-pay

## 2011-08-18 ENCOUNTER — Inpatient Hospital Stay (HOSPITAL_COMMUNITY)
Admission: EM | Admit: 2011-08-18 | Discharge: 2011-08-18 | DRG: 872 | Disposition: A | Discharge status: Other Acute Inpatient Hospital | Payer: PRIVATE HEALTH INSURANCE | Attending: Internal Medicine | Admitting: Internal Medicine

## 2011-08-18 DIAGNOSIS — Z91040 Latex allergy status: Secondary | ICD-10-CM

## 2011-08-18 DIAGNOSIS — I369 Nonrheumatic tricuspid valve disorder, unspecified: Secondary | ICD-10-CM

## 2011-08-18 DIAGNOSIS — Z882 Allergy status to sulfonamides status: Secondary | ICD-10-CM

## 2011-08-18 DIAGNOSIS — Z96659 Presence of unspecified artificial knee joint: Secondary | ICD-10-CM

## 2011-08-18 DIAGNOSIS — D696 Thrombocytopenia, unspecified: Secondary | ICD-10-CM | POA: Diagnosis present

## 2011-08-18 DIAGNOSIS — R509 Fever, unspecified: Secondary | ICD-10-CM

## 2011-08-18 DIAGNOSIS — N289 Disorder of kidney and ureter, unspecified: Secondary | ICD-10-CM

## 2011-08-18 DIAGNOSIS — F411 Generalized anxiety disorder: Secondary | ICD-10-CM | POA: Diagnosis present

## 2011-08-18 DIAGNOSIS — Z8679 Personal history of other diseases of the circulatory system: Secondary | ICD-10-CM

## 2011-08-18 DIAGNOSIS — D539 Nutritional anemia, unspecified: Secondary | ICD-10-CM | POA: Diagnosis present

## 2011-08-18 DIAGNOSIS — C7952 Secondary malignant neoplasm of bone marrow: Secondary | ICD-10-CM | POA: Diagnosis present

## 2011-08-18 DIAGNOSIS — A4189 Other specified sepsis: Principal | ICD-10-CM | POA: Diagnosis present

## 2011-08-18 DIAGNOSIS — G8929 Other chronic pain: Secondary | ICD-10-CM | POA: Diagnosis present

## 2011-08-18 DIAGNOSIS — I959 Hypotension, unspecified: Secondary | ICD-10-CM

## 2011-08-18 DIAGNOSIS — M1990 Unspecified osteoarthritis, unspecified site: Secondary | ICD-10-CM | POA: Diagnosis present

## 2011-08-18 DIAGNOSIS — C801 Malignant (primary) neoplasm, unspecified: Secondary | ICD-10-CM

## 2011-08-18 DIAGNOSIS — E119 Type 2 diabetes mellitus without complications: Secondary | ICD-10-CM | POA: Diagnosis present

## 2011-08-18 DIAGNOSIS — S0990XA Unspecified injury of head, initial encounter: Secondary | ICD-10-CM | POA: Diagnosis present

## 2011-08-18 DIAGNOSIS — Z886 Allergy status to analgesic agent status: Secondary | ICD-10-CM

## 2011-08-18 DIAGNOSIS — N189 Chronic kidney disease, unspecified: Secondary | ICD-10-CM | POA: Diagnosis present

## 2011-08-18 DIAGNOSIS — W19XXXA Unspecified fall, initial encounter: Secondary | ICD-10-CM | POA: Diagnosis present

## 2011-08-18 DIAGNOSIS — R197 Diarrhea, unspecified: Secondary | ICD-10-CM

## 2011-08-18 DIAGNOSIS — Z79899 Other long term (current) drug therapy: Secondary | ICD-10-CM

## 2011-08-18 DIAGNOSIS — I50810 Right heart failure, unspecified: Secondary | ICD-10-CM | POA: Diagnosis present

## 2011-08-18 DIAGNOSIS — C50919 Malignant neoplasm of unspecified site of unspecified female breast: Secondary | ICD-10-CM | POA: Diagnosis present

## 2011-08-18 DIAGNOSIS — N184 Chronic kidney disease, stage 4 (severe): Secondary | ICD-10-CM | POA: Diagnosis present

## 2011-08-18 DIAGNOSIS — M009 Pyogenic arthritis, unspecified: Secondary | ICD-10-CM | POA: Diagnosis present

## 2011-08-18 DIAGNOSIS — E860 Dehydration: Secondary | ICD-10-CM | POA: Diagnosis present

## 2011-08-18 DIAGNOSIS — Z85830 Personal history of malignant neoplasm of bone: Secondary | ICD-10-CM

## 2011-08-18 DIAGNOSIS — A419 Sepsis, unspecified organism: Secondary | ICD-10-CM | POA: Diagnosis present

## 2011-08-18 DIAGNOSIS — Y92009 Unspecified place in unspecified non-institutional (private) residence as the place of occurrence of the external cause: Secondary | ICD-10-CM

## 2011-08-18 DIAGNOSIS — I509 Heart failure, unspecified: Secondary | ICD-10-CM | POA: Diagnosis present

## 2011-08-18 DIAGNOSIS — Z888 Allergy status to other drugs, medicaments and biological substances status: Secondary | ICD-10-CM

## 2011-08-18 DIAGNOSIS — Z91041 Radiographic dye allergy status: Secondary | ICD-10-CM

## 2011-08-18 DIAGNOSIS — I10 Essential (primary) hypertension: Secondary | ICD-10-CM | POA: Diagnosis present

## 2011-08-18 DIAGNOSIS — Z853 Personal history of malignant neoplasm of breast: Secondary | ICD-10-CM

## 2011-08-18 DIAGNOSIS — M542 Cervicalgia: Secondary | ICD-10-CM

## 2011-08-18 DIAGNOSIS — Z881 Allergy status to other antibiotic agents status: Secondary | ICD-10-CM

## 2011-08-18 DIAGNOSIS — C7951 Secondary malignant neoplasm of bone: Secondary | ICD-10-CM | POA: Diagnosis present

## 2011-08-18 DIAGNOSIS — Z7982 Long term (current) use of aspirin: Secondary | ICD-10-CM

## 2011-08-18 DIAGNOSIS — I129 Hypertensive chronic kidney disease with stage 1 through stage 4 chronic kidney disease, or unspecified chronic kidney disease: Secondary | ICD-10-CM | POA: Diagnosis present

## 2011-08-18 LAB — BASIC METABOLIC PANEL
CO2: 22 mEq/L (ref 19–32)
Chloride: 99 mEq/L (ref 96–112)
GFR calc Af Amer: 33 mL/min — ABNORMAL LOW (ref >90)
Potassium: 4.9 mEq/L (ref 3.5–5.1)

## 2011-08-18 LAB — URINALYSIS, MICROSCOPIC ONLY
Glucose, UA: NEGATIVE mg/dL
Leukocytes, UA: NEGATIVE
Nitrite: NEGATIVE
Specific Gravity, Urine: 1.015 (ref 1.005–1.030)
pH: 5.5 (ref 5.0–8.0)

## 2011-08-18 LAB — BODY FLUID CELL COUNT WITH DIFFERENTIAL
Lymphs, Fluid: 2 %
Monocyte-Macrophage-Serous Fluid: 6 % — ABNORMAL LOW (ref 50–90)
Total Nucleated Cell Count, Fluid: 11756 cu mm — ABNORMAL HIGH (ref 0–1000)

## 2011-08-18 LAB — CBC
Platelets: 118 10*3/uL — ABNORMAL LOW (ref 150–400)
RBC: 3.04 MIL/uL — ABNORMAL LOW (ref 3.87–5.11)
RDW: 13.6 % (ref 11.5–15.5)
WBC: 4.4 10*3/uL (ref 4.0–10.5)

## 2011-08-18 LAB — LACTIC ACID, PLASMA: Lactic Acid, Venous: 2.2 mmol/L (ref 0.5–2.2)

## 2011-08-18 LAB — GLUCOSE, CAPILLARY
Glucose-Capillary: 69 mg/dL — ABNORMAL LOW (ref 70–99)
Glucose-Capillary: 95 mg/dL (ref 70–99)

## 2011-08-18 LAB — HEMOGLOBIN A1C
Hgb A1c MFr Bld: 5.3 % (ref <5.7)
Mean Plasma Glucose: 105 mg/dL (ref <117)

## 2011-08-18 LAB — TSH: TSH: 1.3 u[IU]/mL (ref 0.350–4.500)

## 2011-08-18 LAB — HEPATIC FUNCTION PANEL
Bilirubin, Direct: 0.2 mg/dL (ref 0.0–0.3)
Indirect Bilirubin: 0.1 mg/dL — ABNORMAL LOW (ref 0.3–0.9)

## 2011-08-18 LAB — PROCALCITONIN: Procalcitonin: 10.35 ng/mL

## 2011-08-18 LAB — TROPONIN I: Troponin I: <0.3 ng/mL (ref <0.30)

## 2011-08-18 LAB — MRSA PCR SCREENING: MRSA by PCR: NEGATIVE

## 2011-08-18 MED ORDER — BISACODYL 5 MG PO TBEC
5.0000 mg | DELAYED_RELEASE_TABLET | Freq: Every day | ORAL | Status: DC
  Administered 2011-08-18: 5 mg via ORAL
  Filled 2011-08-18: qty 1

## 2011-08-18 MED ORDER — SODIUM CHLORIDE 0.9 % IJ SOLN: 3.0000 mL | Freq: Two times a day (BID) | INTRAMUSCULAR | Status: DC

## 2011-08-18 MED ORDER — HYDROCODONE-ACETAMINOPHEN 10-325 MG PO TABS: 1.0000 | ORAL_TABLET | Freq: Three times a day (TID) | ORAL | Status: DC | PRN

## 2011-08-18 MED ORDER — SODIUM CHLORIDE 0.9 % IV BOLUS (SEPSIS)
1000.0000 mL | Freq: Once | INTRAVENOUS | Status: AC
  Administered 2011-08-18: 1000 mL via INTRAVENOUS

## 2011-08-18 MED ORDER — FENTANYL 25 MCG/HR TD PT72
25.0000 ug | MEDICATED_PATCH | TRANSDERMAL | Status: DC
  Administered 2011-08-18: 25 ug via TRANSDERMAL
  Filled 2011-08-18: qty 1

## 2011-08-18 MED ORDER — INSULIN ASPART 100 UNIT/ML ~~LOC~~ SOLN: 0.0000 [IU] | Freq: Three times a day (TID) | SUBCUTANEOUS | Status: DC

## 2011-08-18 MED ORDER — HYDROCODONE-ACETAMINOPHEN 5-325 MG PO TABS: 1.0000 | ORAL_TABLET | ORAL | Status: AC | PRN

## 2011-08-18 MED ORDER — ASPIRIN EC 81 MG PO TBEC
81.0000 mg | DELAYED_RELEASE_TABLET | Freq: Every day | ORAL | Status: DC
  Administered 2011-08-18: 81 mg via ORAL
  Filled 2011-08-18: qty 1

## 2011-08-18 MED ORDER — ACETAMINOPHEN 325 MG PO TABS
650.0000 mg | ORAL_TABLET | Freq: Once | ORAL | Status: AC
  Administered 2011-08-18: 650 mg via ORAL
  Filled 2011-08-18: qty 1
  Filled 2011-08-18: qty 2

## 2011-08-18 MED ORDER — ALUM & MAG HYDROXIDE-SIMETH 200-200-20 MG/5ML PO SUSP: 30.0000 mL | Freq: Four times a day (QID) | ORAL | Status: DC | PRN

## 2011-08-18 MED ORDER — VANCOMYCIN HCL IN DEXTROSE 1-5 GM/200ML-% IV SOLN
1000.0000 mg | INTRAVENOUS | Status: DC
  Filled 2011-08-18: qty 200

## 2011-08-18 MED ORDER — VANCOMYCIN HCL IN DEXTROSE 1-5 GM/200ML-% IV SOLN
1000.0000 mg | INTRAVENOUS | Status: DC
  Filled 2011-08-18 (×2): qty 200

## 2011-08-18 MED ORDER — FOLIC ACID 1 MG PO TABS
1.0000 mg | ORAL_TABLET | Freq: Every day | ORAL | Status: DC
  Administered 2011-08-18: 1 mg via ORAL
  Filled 2011-08-18: qty 1

## 2011-08-18 MED ORDER — DEXTROSE 5 % IV SOLN
2.0000 g | Freq: Once | INTRAVENOUS | Status: AC
  Administered 2011-08-18: 2 g via INTRAVENOUS
  Filled 2011-08-18: qty 2

## 2011-08-18 MED ORDER — GABAPENTIN 300 MG PO CAPS: 600.0000 mg | ORAL_CAPSULE | Freq: Every day | ORAL | Status: DC

## 2011-08-18 MED ORDER — PANTOPRAZOLE SODIUM 40 MG PO TBEC
40.0000 mg | DELAYED_RELEASE_TABLET | Freq: Every day | ORAL | Status: DC
  Administered 2011-08-18: 40 mg via ORAL
  Filled 2011-08-18: qty 1

## 2011-08-18 MED ORDER — VANCOMYCIN HCL IN DEXTROSE 1-5 GM/200ML-% IV SOLN
1000.0000 mg | Freq: Once | INTRAVENOUS | Status: AC
  Administered 2011-08-18: 1000 mg via INTRAVENOUS
  Filled 2011-08-18: qty 200

## 2011-08-18 MED ORDER — SODIUM CHLORIDE 0.9 % IJ SOLN
10.0000 mL | Freq: Two times a day (BID) | INTRAMUSCULAR | Status: DC
  Filled 2011-08-18: qty 9

## 2011-08-18 MED ORDER — ONDANSETRON 8 MG PO TBDP: 8.0000 mg | ORAL_TABLET | Freq: Three times a day (TID) | ORAL | Status: AC | PRN

## 2011-08-18 MED ORDER — SODIUM CHLORIDE 0.9 % IV BOLUS (SEPSIS): 500.0000 mL | Freq: Once | INTRAVENOUS | Status: DC

## 2011-08-18 MED ORDER — SODIUM CHLORIDE 0.9 % IV SOLN
INTRAVENOUS | Status: AC
  Administered 2011-08-18: 11:00:00 via INTRAVENOUS

## 2011-08-18 MED ORDER — CLONAZEPAM 0.5 MG PO TABS: 0.5000 mg | ORAL_TABLET | Freq: Every day | ORAL | Status: DC

## 2011-08-18 MED ORDER — ONDANSETRON HCL 4 MG PO TABS: 8.0000 mg | ORAL_TABLET | Freq: Three times a day (TID) | ORAL | Status: DC | PRN

## 2011-08-18 MED ORDER — MONTELUKAST SODIUM 10 MG PO TABS: 10.0000 mg | ORAL_TABLET | Freq: Every day | ORAL | Status: DC

## 2011-08-18 MED ORDER — INSULIN ASPART 100 UNIT/ML ~~LOC~~ SOLN: 0.0000 [IU] | Freq: Every day | SUBCUTANEOUS | Status: DC

## 2011-08-18 MED ORDER — SODIUM CHLORIDE 0.9 % IJ SOLN: 10.0000 mL | INTRAMUSCULAR | Status: DC | PRN

## 2011-08-18 MED ORDER — DEXTROSE 5 % IV SOLN
1.0000 g | Freq: Two times a day (BID) | INTRAVENOUS | Status: DC
  Filled 2011-08-18 (×3): qty 1

## 2011-08-18 MED ORDER — HEPARIN SODIUM (PORCINE) 5000 UNIT/ML IJ SOLN
5000.0000 [IU] | Freq: Three times a day (TID) | INTRAMUSCULAR | Status: DC
  Administered 2011-08-18: 5000 [IU] via SUBCUTANEOUS
  Filled 2011-08-18: qty 1

## 2011-08-18 MED ORDER — EXEMESTANE 25 MG PO TABS
25.0000 mg | ORAL_TABLET | Freq: Every day | ORAL | Status: DC
  Administered 2011-08-18: 25 mg via ORAL
  Filled 2011-08-18 (×3): qty 1

## 2011-08-18 MED ORDER — IBUPROFEN 400 MG PO TABS
600.0000 mg | ORAL_TABLET | Freq: Once | ORAL | Status: AC
  Administered 2011-08-18: 600 mg via ORAL
  Filled 2011-08-18: qty 2

## 2011-08-18 MED ORDER — ACETAMINOPHEN 325 MG PO TABS: 650.0000 mg | ORAL_TABLET | Freq: Four times a day (QID) | ORAL | Status: DC | PRN

## 2011-08-18 MED ORDER — SODIUM CHLORIDE 0.9 % IV SOLN
INTRAVENOUS | Status: DC
  Administered 2011-08-18: 1000 mL via INTRAVENOUS
  Administered 2011-08-18: 150 mL via INTRAVENOUS

## 2011-08-18 MED ORDER — MORPHINE SULFATE 4 MG/ML IJ SOLN
4.0000 mg | Freq: Once | INTRAMUSCULAR | Status: AC
  Administered 2011-08-18: 4 mg via INTRAVENOUS
  Filled 2011-08-18: qty 1

## 2011-08-18 MED ORDER — GABAPENTIN 300 MG PO CAPS
300.0000 mg | ORAL_CAPSULE | Freq: Every day | ORAL | Status: DC
  Filled 2011-08-18: qty 1

## 2011-08-18 MED ORDER — CEFEPIME HCL 1 G IJ SOLR
1.0000 g | Freq: Two times a day (BID) | INTRAMUSCULAR | Status: DC
  Administered 2011-08-18: 1 g via INTRAVENOUS
  Filled 2011-08-18 (×2): qty 1

## 2011-08-18 MED ORDER — OXYCODONE-ACETAMINOPHEN 5-325 MG PO TABS
ORAL_TABLET | ORAL | Status: AC
  Administered 2011-08-18: 2
  Filled 2011-08-18: qty 2

## 2011-08-18 MED ORDER — GABAPENTIN 300 MG PO CAPS
300.0000 mg | ORAL_CAPSULE | Freq: Two times a day (BID) | ORAL | Status: DC
  Administered 2011-08-18: 300 mg via ORAL

## 2011-08-18 NOTE — Progress Notes (Signed)
PT IS REFUSING TO HAVE CENTRAL LINE INSERTED. SHE IS VERY ADA MENT. PICC STAFF STATES THAT D/T HER STATUS AS BILATERAL MASSTECTOMY, THEY WOULD NOT INSERT PICC LINE.

## 2011-08-18 NOTE — H&P (Addendum)
Hospital Admission Note Date: 08/18/2011  Patient name: Catherine Mccullough Medical record number: [id number] Date of birth: [birth year] Age: 66 y.o. Gender: female PCP: out of town  Attending physician: Christiane Ha, MD  Chief Complaint: fell and hit head  History of Present Illness:  Catherine Mccullough is an 66 y.o. female with multiple medical problems who presents after having fallen in the bathroom. She hit the back of her head. In the emergency room, she was noted to be febrile. Initially was normotensive, but her systolic blood pressure subsequently dropped to 70. After 2 L of fluid it is currently about 90/30. She is a vague historian. Initially she said she had no fevers at home, however she contradicted herself and said she may have had fevers for the past few days. No dysuria, rash, cough, vomiting or diarrhea. She was seen in the emergency room a few days ago for left knee pain and swelling. She had an arthrocentesis that showed white cells and culture today is negative. She was given pain medication and released home. She reports she has a history of "CHF" and "opened valves" after rheumatic fever as a child. She reports that this CHF is a new diagnosis and she is scheduled for an echocardiogram tomorrow. She has no shortness of breath currently. She has not taken her medications today. She received morphine in the emergency room and is slightly groggy. Venous lactic acid was normal. White blood cell count normal. Chest x-ray and urinalysis showed nothing concerning. 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 She is status post bilateral mastectomy, chemotherapy and radiation several years ago. She tells me she is in remission. She has a primary care physician, oncologist, cardiologist in [place]. She reports that she was hospitalized for about a week at [facility] a few months ago for a Port-A-Cath infection. Her Port-A-Cath was removed.  Past Medical History    Diagnosis Date  . Kidney disease   . Hypertension   . Diabetes mellitus   . 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 Arthritis   . CHF (congestive heart failure)   . Lymphedema of leg   . Anxiety     Meds: Prescriptions prior to admission  Medication Sig Dispense Refill  . acetaminophen (TYLENOL) 500 MG tablet Take 1,000 mg by mouth every 6 (six) hours as needed. Pain      . acetaminophen (TYLENOL) 650 MG CR tablet Take 650 mg by mouth every 8 (eight) hours as needed. Arthritis Pain      . aspirin EC 81 MG tablet Take 81 mg by mouth daily.        . bisacodyl (DULCOLAX) 5 MG EC tablet Take 5 mg by mouth daily.      . bumetanide (BUMEX) 1 MG tablet Take 1 mg by mouth as needed. For fluid      . calcium-vitamin D (OSCAL WITH D) 500-200 MG-UNIT per tablet Take 1 tablet by mouth daily.      . clonazePAM (KLONOPIN) 0.5 MG tablet Take 1 tablet by mouth daily.       . DENTAGEL 1.1 % GEL dental gel Take 1 application by mouth Daily.      . diphenhydramine-acetaminophen (TYLENOL PM) 25-500 MG TABS Take 1 tablet by mouth at bedtime as needed. Sleep/Pain      . esomeprazole (NEXIUM) 40 MG capsule Take 40 mg by mouth daily before breakfast.        . exemestane (AROMASIN) 25  MG tablet Take 25 mg by mouth daily.        . fentaNYL (DURAGESIC - DOSED MCG/HR) 25 MCG/HR Place 1 patch onto the skin every other day. For pain. **Remove used patches and apply a new patch for each application**  Time for patch to be changed today 01/11/11...      . fexofenadine (ALLEGRA) 180 MG tablet Take 180 mg by mouth daily.        . folic acid (FOLVITE) 1 MG tablet Take 1 mg by mouth daily.      Marland Kitchen gabapentin (NEURONTIN) 300 MG capsule Take 300-600 mg by mouth 2 (two) times daily. Take 300mg  every morning and 600mg  every night      . HYDROcodone-acetaminophen (LORTAB) 10-500 MG per tablet Take 1 tablet by mouth Every 8 hours as needed. For pain      . lisinopril (PRINIVIL,ZESTRIL) 5 MG tablet Take 1 tablet by mouth Daily.      .  magnesium citrate SOLN Take 1 Bottle by mouth once.      . metoprolol succinate (TOPROL-XL) 25 MG 24 hr tablet Take 1 tablet by mouth Daily.      . montelukast (SINGULAIR) 10 MG tablet Take 10 mg by mouth at bedtime.        . Multiple Vitamins-Minerals (MULTIVITAMINS THER. W/MINERALS) TABS Take 1 tablet by mouth daily.        . ondansetron (ZOFRAN) 8 MG tablet Take 8 mg by mouth every 8 (eight) hours as needed. nausea       . oxyCODONE-acetaminophen (PERCOCET/ROXICET) 5-325 MG per tablet Take 1-2 tablets by mouth every 6 (six) hours as needed for pain.  20 tablet  0  . UNKNOWN TO PATIENT Inject 1 application into the articular space every 30 (thirty) days. Patient receives an injection into spine/back area for Degenerative Bone at office (Dr. Theodora Blow)      . vitamin C (ASCORBIC ACID) 500 MG tablet Take 1,000 mg by mouth daily.      . vitamin E 400 UNIT capsule Take 400 Units by mouth daily.        Allergies: Mometasone; Aspirin; Contrast media; Cortisone; Lasix; Lyrica; Other; Prednisone; Sulfamethoxazole; Ultram; Codeine; Dilantin; Latex; and Zosyn Social history: She lives alone. She is divorced. Her father is here with her. She denies smoking history. Denies heavy drinking or drug use. She ambulates with the assistance of a cane or walker. When asked about CODE STATUS she reports she would not want to "be on machines" but CPR shock and medications are acceptable.  Family history: Mother and sisters have diabetes. Breast cancer "on father's side".  Past Surgical History  Procedure Date  . Breast surgery   . Cholecystectomy   . Abdominal hysterectomy   . Replacement total knee bilateral   . Hand tendon surgery     Review of Systems: Systems reviewed and as per HPI, otherwise negative.  Physical Exam: Blood pressure 105/38, pulse 103, temperature 100 F (37.8 C), temperature source Oral, resp. rate 19, height 4\' 7"  (1.397 m), weight 51.2 kg (112 lb 14 oz), SpO2 100.00%. BP 111/51   Pulse 25  Temp 100 F (37.8 C) (Oral)  Resp 19  Ht 4\' 7"  (1.397 m)  Wt 51.2 kg (112 lb 14 oz)  BMI 26.23 kg/m2  SpO2 81%  General Appearance:   slightly groggy. Appropriate and cooperative.   Head:    Normocephalic, without obvious abnormality, atraumatic  Eyes:    PERRL, conjunctiva/corneas clear, EOM's intact, fundi  benign, both eyes  Ears:    Normal TM's and external ear canals, both ears  Nose:   Nares normal, septum midline, mucosa normal, no drainage    or sinus tenderness  Throat:   extremely dry mucous membranes with cracking of her lips.   Neck:   Supple, symmetrical, trachea midline, no adenopathy;    thyroid:  no enlargement/tenderness/nodules; no carotid   bruit or JVD  Back:     Symmetric, no curvature, ROM normal, no CVA tenderness  Lungs:     Clear to auscultation bilaterally, respirations unlabored      Heart:    Regular rate and rhythm, S1 and S2 normal, no murmur, rub   or gallop  Breast Exam:    post bilateral mastectomy   Abdomen:     Soft, non-tender, bowel sounds active all four quadrants,    no masses, no organomegaly  Genitalia:   deferred   Rectal:   deferred   Extremities:   left knee with a mild effusion. No tenderness erythema.   Pulses:   2+ and symmetric all extremities  Skin:   Skin color, texture, turgor normal, no rashes or lesions scar along the right chest from previous mastectomy   Lymph nodes:   Cervical, supraclavicular, and axillary nodes normal  Neurologic:   CNII-XII intact, normal strength, sensation and reflexes    throughout    Psychiatric: Normal affect.  Lab results: Basic Metabolic Panel:  Basename 08/18/11 0439  NA 134*  K 4.9  CL 99  CO2 22  GLUCOSE 175*  BUN 39*  CREATININE 1.82*  CALCIUM 9.6  MG --  PHOS --   Liver Function Tests: No results found for this basename: AST:2,ALT:2,ALKPHOS:2,BILITOT:2,PROT:2,ALBUMIN:2 in the last 72 hours No results found for this basename: LIPASE:2,AMYLASE:2 in the last 72  hours No results found for this basename: AMMONIA:2 in the last 72 hours CBC:  Basename 08/18/11 0439  WBC 4.4  NEUTROABS --  HGB 10.7*  HCT 32.0*  MCV 105.3*  PLT 118*   Cardiac Enzymes:  Basename 08/18/11 0840  CKTOTAL --  CKMB --  CKMBINDEX --  TROPONINI <0.30   BNP: No results found for this basename: PROBNP:3 in the last 72 hours D-Dimer: No results found for this basename: DDIMER:2 in the last 72 hours CBG: No results found for this basename: GLUCAP:6 in the last 72 hours Hemoglobin A1C: No results found for this basename: HGBA1C in the last 72 hours Fasting Lipid Panel: No results found for this basename: CHOL,HDL,LDLCALC,TRIG,CHOLHDL,LDLDIRECT in the last 72 hours Thyroid Function Tests: No results found for this basename: TSH,T4TOTAL,FREET4,T3FREE,THYROIDAB in the last 72 hours Anemia Panel: No results found for this basename: VITAMINB12,FOLATE,FERRITIN,TIBC,IRON,RETICCTPCT in the last 72 hours Coagulation: No results found for this basename: LABPROT:2,INR:2 in the last 72 hours Urine Drug Screen: Drugs of Abuse  No results found for this basename: labopia, cocainscrnur, labbenz, amphetmu, thcu, labbarb    Alcohol Level: No results found for this basename: ETH:2 in the last 72 hours Urinalysis:  Basename 08/18/11 0624  COLORURINE YELLOW  LABSPEC 1.015  PHURINE 5.5  GLUCOSEU NEGATIVE  HGBUR NEGATIVE  BILIRUBINUR NEGATIVE  KETONESUR NEGATIVE  PROTEINUR 30*  UROBILINOGEN 0.2  NITRITE NEGATIVE  LEUKOCYTESUR NEGATIVE   Imaging results:  Dg Chest 2 View  08/18/2011  *RADIOLOGY REPORT*  Clinical Data: Fall.  Fever.  CHEST - 2 VIEW  Comparison: 01/11/2011.  Findings: No pneumothorax.  No displaced rib fractures are identified.  Right apical increased density appears chronic, similar  to prior examinations overlying the right anterior first rib.  Presumably this relates to radiation treatment in this patient with history of mastectomy and breast surgery.   Stable prominence of the left hilum.  No airspace disease.  No effusion. Right glenohumeral osteoarthritis.  If the patient has complaints referable to the right shoulder, consider right shoulder radiographs.  Interval removal of right IJ Port-A-Cath.  IMPRESSION: 1.  No acute abnormality. 2.  Interval removal of right IJ Port-A-Cath. 3.  The right upper lobes scarring appears stable compared to prior exam, possibly related to radiation changes.  Original Report Authenticated By: Andreas Newport, M.D.   Ct Head Wo Contrast  08/18/2011  *RADIOLOGY REPORT*  Clinical Data:  Fall.  Head trauma.  CT HEAD WITHOUT CONTRAST CT CERVICAL SPINE WITHOUT CONTRAST  Technique:  Multidetector CT imaging of the head and cervical spine was performed following the standard protocol without intravenous contrast.  Multiplanar CT image reconstructions of the cervical spine were also generated.  Comparison:  07/21/2010.  CT HEAD  Findings: Low attenuation is present in the right temporal tip anteriorly and in the inferior right frontal lobe associated with old trauma.  Benign basal ganglia calcifications.  Mild atrophy and chronic ischemic periventricular white matter disease.  No mass lesion, mass effect, midline shift, hydrocephalus, or hemorrhage. Visualized paranasal sinuses appear normal.  Scattered fluid is present in the left mastoid air cells.  Intracranial atherosclerosis.  No skull fracture.  Bilateral lens extractions.  IMPRESSION: 1.  No acute intracranial abnormality. 2.  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 Old infarcts can produce a similar appearance. 3.  Mild atrophy and chronic ischemic white matter disease.  CT CERVICAL SPINE  Findings: Small amount of fluid in the left mastoid air cells again noted.  2 mm anterolisthesis of C7 on T1 is present which appears degenerative, associated with facet arthrosis.  Multilevel cervical spondylosis  with disc osteophyte complexes and uncovertebral spurring producing right greater than left foraminal encroachment. Thickening of the upper esophagus is present which is nonspecific. This may relate to stasis or reflux.  Consider follow-up endoscopy. There is no cervical spine fracture, subluxation, or dislocation. Severe temporomandibular joint osteoarthritis incidentally noted.  IMPRESSION: No acute osseous abnormality.  Multilevel cervical spondylosis without fracture or dislocation.  Likely degenerative anterolisthesis of C7 on T1 associated with facet arthrosis.  Original Report Authenticated By: Andreas Newport, M.D.   Ct Cervical Spine Wo Contrast  08/18/2011  *RADIOLOGY REPORT*  Clinical Data:  Fall.  Head trauma.  CT HEAD WITHOUT CONTRAST CT CERVICAL SPINE WITHOUT CONTRAST  Technique:  Multidetector CT imaging of the head and cervical spine was performed following the standard protocol without intravenous contrast.  Multiplanar CT image reconstructions of the cervical spine were also generated.  Comparison:  07/21/2010.  CT HEAD  Findings: Low attenuation is present in the right temporal tip anteriorly and in the inferior right frontal lobe associated with old trauma.  Benign basal ganglia calcifications.  Mild atrophy and chronic ischemic periventricular white matter disease.  No mass lesion, mass effect, midline shift, hydrocephalus, or hemorrhage. Visualized paranasal sinuses appear normal.  Scattered fluid is present in the left mastoid air cells.  Intracranial atherosclerosis.  No skull fracture.  Bilateral lens extractions.  IMPRESSION: 1.  No acute intracranial abnormality. 2.  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 Old infarcts can produce a similar appearance. 3.  Mild atrophy  and chronic ischemic white matter disease.  CT CERVICAL SPINE  Findings: Small amount of fluid in the left mastoid air cells again noted.  2 mm  anterolisthesis of C7 on T1 is present which appears degenerative, associated with facet arthrosis.  Multilevel cervical spondylosis with disc osteophyte complexes and uncovertebral spurring producing right greater than left foraminal encroachment. Thickening of the upper esophagus is present which is nonspecific. This may relate to stasis or reflux.  Consider follow-up endoscopy. There is no cervical spine fracture, subluxation, or dislocation. Severe temporomandibular joint osteoarthritis incidentally noted.  IMPRESSION: No acute osseous abnormality.  Multilevel cervical spondylosis without fracture or dislocation.  Likely degenerative anterolisthesis of C7 on T1 associated with facet arthrosis.  Original Report Authenticated By: Andreas Newport, M.D.    Assessment & Plan: Principal Problem:  *Sepsis: Etiology unknown. She reports having had rheumatic fever as a child and has valvular heart disease. We'll check a transthoracic echocardiogram and get records from [place]. She also had a recent Port-A-Cath infection. is at risk for endocarditis. Blood cultures have been drawn. Will give another bolus of fluid. Monitor and step down. Continue vancomycin and cefepime. She is an IV in her left hand. She adamantly refuses a central line at this time, even if to save her life. Will check a pro calcitonin. She has limited code. No intubation and mechanical ventilation.   Dehydration  CHF (congestive heart failure), ejection fraction unknown. Does not appear fluid overloaded at this time.  Macrocytic anemia: Will check B12 and folate levels  Breast cancer metastasized to bone, reportedly in remission  Chronic pain  Thrombocytopenia  CKD (chronic kidney disease), baseline creatinine unknown. A year ago, her creatinine was 3.  DM type 2 (diabetes mellitus, type 2), reportedly diet controlled. Will check hemoglobin A1c, capillary blood glucose and give sliding scale insulin as needed.  OA (osteoarthritis)   Fall  Essential hypertension hold medications in the setting of shock.  Critical care time 60 minutes.  Caila Cirelli L 08/18/2011, 11:20 AM

## 2011-08-18 NOTE — Progress Notes (Signed)
*  PRELIMINARY RESULTS* Echocardiogram 2D Echocardiogram has been performed.  Catherine Mccullough 08/18/2011, 11:48 AM

## 2011-08-18 NOTE — ED Notes (Signed)
Took a laxative before going to bed, was up trying to go to the bathroom and fell in the bathroom, was trying to sit down on the toilet and missed it. Was dirty with stool when EMS arrived. Hit head on the sink per pt.

## 2011-08-18 NOTE — ED Provider Notes (Addendum)
History     CSN: [id number]  Arrival date & time 08/18/11  0407   First MD Initiated Contact with Patient 08/18/11 0410      Chief Complaint  Patient presents with  . Fall  . Fever    The history is provided by the patient.  the patient reports she was getting up to use the bathroom when she fell in the bathroom trying to sit down.  She struck her head against the sink and reports headache and pain in the left side of her head as well as neck pain at this time.  She denies weakness of her upper lower extremities.  She has no chest pain shortness of breath.  She denies abdominal pain or back pain.  She reports she was seen emergency room several days ago for left knee pain which continues to bother her and reports that an arthrocentesis was performed of focal fluid off.  She does report her pain in her left knee is improved since getting the fluid off.  Review the medical record demonstrates no growth to date on the synovial fluid pulled from the left knee.  Gram stain at that time demonstrated white blood cells without organisms.  On arrival to the emergency department the patient was noted to have a fever of unknown 3.2.  She reports this is the first day of that.  She was unaware of having a fever.  She has no shortness of breath.  She denies rash.  Family reports baseline mental status.  Her pain is mild to moderate at this time is located in the head and neck as well as her left knee.  The left knee is not acute pain and was present prior to the fall today.  She does report having constipation for several days and she used mag citrate last night to help with her constipation which resulted in significant stool output.  She has no other complaints at this time.  Past Medical History  Diagnosis Date  . Kidney disease   . Hypertension   . Diabetes mellitus   . 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 Arthritis   . CHF (congestive heart failure)   . Lymphedema of leg   . Anxiety     Past  Surgical History  Procedure Date  . Breast surgery   . Cholecystectomy   . Abdominal hysterectomy   . Replacement total knee bilateral   . Hand tendon surgery     History reviewed. No pertinent family history.  History  Substance Use Topics  . Smoking status: Never Smoker   . Smokeless tobacco: Not on file  . Alcohol Use: No    OB History    Grav Para Term Preterm Abortions TAB SAB Ect Mult Living                  Review of Systems  Constitutional: Positive for fever.  All other systems reviewed and are negative.    Allergies  Mometasone; Aspirin; Contrast media; Cortisone; Lasix; Lyrica; Other; Prednisone; Sulfamethoxazole; Ultram; Codeine; Dilantin; and Zosyn  Home Medications   Current Outpatient Rx  Name Route Sig Dispense Refill  . AMLODIPINE BESYLATE 10 MG PO TABS Oral Take 10 mg by mouth daily.      . ASPIRIN EC 81 MG PO TBEC Oral Take 81 mg by mouth daily.      . BUMETANIDE 1 MG PO TABS Oral Take 1 mg by mouth as needed. For fluid    .  CALCIUM CARBONATE-VITAMIN D 500-200 MG-UNIT PO TABS Oral Take 1 tablet by mouth daily.    Marland Kitchen CLONAZEPAM 0.5 MG PO TABS Oral Take 1 tablet by mouth Twice daily.    . DENTAGEL 1.1 % DT GEL Oral Take 1 application by mouth Daily.    Marland Kitchen ESOMEPRAZOLE MAGNESIUM 40 MG PO CPDR Oral Take 40 mg by mouth daily before breakfast.      . EXEMESTANE 25 MG PO TABS Oral Take 25 mg by mouth daily.      . FENTANYL 25 MCG/HR TD PT72 Transdermal Place 1 patch onto the skin every other day. For pain. **Remove used patches and apply a new patch for each application**  Time for patch to be changed today 01/11/11...    . FEXOFENADINE HCL 180 MG PO TABS Oral Take 180 mg by mouth daily.      Marland Kitchen FOLIC ACID 1 MG PO TABS Oral Take 1 mg by mouth daily.    Marland Kitchen GABAPENTIN 300 MG PO CAPS Oral Take 300-600 mg by mouth 2 (two) times daily. Take 300mg  every morning and 600mg  every night    . HYDROCODONE-ACETAMINOPHEN 10-500 MG PO TABS Oral Take 1 tablet by mouth Every 8  hours as needed. For pain    . LISINOPRIL 5 MG PO TABS Oral Take 1 tablet by mouth Daily.    Marland Kitchen METOPROLOL SUCCINATE ER 25 MG PO TB24 Oral Take 1 tablet by mouth Daily.    Marland Kitchen MONTELUKAST SODIUM 10 MG PO TABS Oral Take 10 mg by mouth at bedtime.      Carma Leaven M PLUS PO TABS Oral Take 1 tablet by mouth daily.      Marland Kitchen ONDANSETRON HCL 8 MG PO TABS Oral Take 8 mg by mouth every 8 (eight) hours as needed. nausea     . OXYCODONE-ACETAMINOPHEN 5-325 MG PO TABS Oral Take 1-2 tablets by mouth every 6 (six) hours as needed for pain. 20 tablet 0  . UNKNOWN TO PATIENT Intra-articular Inject 1 application into the articular space every 30 (thirty) days. Patient receives an injection into spine/back area for Degenerative Bone at office (Dr. Theodora Blow)    . VITAMIN C 500 MG PO TABS Oral Take 1,000 mg by mouth daily.    Marland Kitchen VITAMIN E 400 UNITS PO CAPS Oral Take 400 Units by mouth daily.      BP 109/30  Pulse 122  Temp 102.5 F (39.2 C) (Oral)  Resp 22  Ht 4\' 7"  (1.397 m)  Wt 106 lb (48.081 kg)  BMI 24.64 kg/m2  SpO2 95%  Physical Exam  Nursing note and vitals reviewed. Constitutional: She is oriented to person, place, and time. She appears well-developed and well-nourished. No distress.       febrile  HENT:  Head: Normocephalic and atraumatic.       Mucous membranes dry  Eyes: EOM are normal.  Neck:       Cervical and paracervical spinal tenderness.immobilized in cervical collar on arrival.   Cardiovascular: Normal rate, regular rhythm and normal heart sounds.   Pulmonary/Chest: Effort normal and breath sounds normal.  Abdominal: Soft. She exhibits no distension. There is no tenderness. There is no rebound and no guarding.  Musculoskeletal: Normal range of motion.       5 Out of 5 strength in bilateral upper lower extremity major muscle groups.  Left knee without evidence of effusion.  Left knee without erythema warmth.  Full range of motion of left knee.  Normal pulses in left foot.no  thoracic or lumbar  spinal tenderness  Neurological: She is alert and oriented to person, place, and time.  Skin: Skin is warm and dry.  Psychiatric: She has a normal mood and affect. Judgment normal.    ED Course  Procedures (including critical care time)  Labs Reviewed  CBC - Abnormal; Notable for the following:    RBC 3.04 (*)     Hemoglobin 10.7 (*)     HCT 32.0 (*)     MCV 105.3 (*)     MCH 35.2 (*)     Platelets 118 (*)     All other components within normal limits  BASIC METABOLIC PANEL - Abnormal; Notable for the following:    Sodium 134 (*)     Glucose, Bld 175 (*)     BUN 39 (*)     Creatinine, Ser 1.82 (*)     GFR calc non Af Amer 28 (*)     GFR calc Af Amer 33 (*)     All other components within normal limits  URINALYSIS, WITH MICROSCOPIC - Abnormal; Notable for the following:    Protein, ur 30 (*)     All other components within normal limits  CULTURE, BLOOD (ROUTINE X 2)  URINE CULTURE  CULTURE, BLOOD (ROUTINE X 2)   Dg Chest 2 View  08/18/2011  *RADIOLOGY REPORT*  Clinical Data: Fall.  Fever.  CHEST - 2 VIEW  Comparison: 01/11/2011.  Findings: No pneumothorax.  No displaced rib fractures are identified.  Right apical increased density appears chronic, similar to prior examinations overlying the right anterior first rib.  Presumably this relates to radiation treatment in this patient with history of mastectomy and breast surgery.  Stable prominence of the left hilum.  No airspace disease.  No effusion. Right glenohumeral osteoarthritis.  If the patient has complaints referable to the right shoulder, consider right shoulder radiographs.  Interval removal of right IJ Port-A-Cath.  IMPRESSION: 1.  No acute abnormality. 2.  Interval removal of right IJ Port-A-Cath. 3.  The right upper lobes scarring appears stable compared to prior exam, possibly related to radiation changes.  Original Report Authenticated By: Andreas Newport, M.D.   Ct Head Wo Contrast  08/18/2011  *RADIOLOGY REPORT*   Clinical Data:  Fall.  Head trauma.  CT HEAD WITHOUT CONTRAST CT CERVICAL SPINE WITHOUT CONTRAST  Technique:  Multidetector CT imaging of the head and cervical spine was performed following the standard protocol without intravenous contrast.  Multiplanar CT image reconstructions of the cervical spine were also generated.  Comparison:  07/21/2010.  CT HEAD  Findings: Low attenuation is present in the right temporal tip anteriorly and in the inferior right frontal lobe associated with old trauma.  Benign basal ganglia calcifications.  Mild atrophy and chronic ischemic periventricular white matter disease.  No mass lesion, mass effect, midline shift, hydrocephalus, or hemorrhage. Visualized paranasal sinuses appear normal.  Scattered fluid is present in the left mastoid air cells.  Intracranial atherosclerosis.  No skull fracture.  Bilateral lens extractions.  IMPRESSION: 1.  No acute intracranial abnormality. 2.  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 Old infarcts can produce a similar appearance. 3.  Mild atrophy and chronic ischemic white matter disease.  CT CERVICAL SPINE  Findings: Small amount of fluid in the left mastoid air cells again noted.  2 mm anterolisthesis of C7 on T1 is present which appears degenerative, associated with facet arthrosis.  Multilevel cervical spondylosis with disc  osteophyte complexes and uncovertebral spurring producing right greater than left foraminal encroachment. Thickening of the upper esophagus is present which is nonspecific. This may relate to stasis or reflux.  Consider follow-up endoscopy. There is no cervical spine fracture, subluxation, or dislocation. Severe temporomandibular joint osteoarthritis incidentally noted.  IMPRESSION: No acute osseous abnormality.  Multilevel cervical spondylosis without fracture or dislocation.  Likely degenerative anterolisthesis of C7 on T1 associated with facet  arthrosis.  Original Report Authenticated By: Andreas Newport, M.D.   Ct Cervical Spine Wo Contrast  08/18/2011  *RADIOLOGY REPORT*  Clinical Data:  Fall.  Head trauma.  CT HEAD WITHOUT CONTRAST CT CERVICAL SPINE WITHOUT CONTRAST  Technique:  Multidetector CT imaging of the head and cervical spine was performed following the standard protocol without intravenous contrast.  Multiplanar CT image reconstructions of the cervical spine were also generated.  Comparison:  07/21/2010.  CT HEAD  Findings: Low attenuation is present in the right temporal tip anteriorly and in the inferior right frontal lobe associated with old trauma.  Benign basal ganglia calcifications.  Mild atrophy and chronic ischemic periventricular white matter disease.  No mass lesion, mass effect, midline shift, hydrocephalus, or hemorrhage. Visualized paranasal sinuses appear normal.  Scattered fluid is present in the left mastoid air cells.  Intracranial atherosclerosis.  No skull fracture.  Bilateral lens extractions.  IMPRESSION: 1.  No acute intracranial abnormality. 2.  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 Old infarcts can produce a similar appearance. 3.  Mild atrophy and chronic ischemic white matter disease.  CT CERVICAL SPINE  Findings: Small amount of fluid in the left mastoid air cells again noted.  2 mm anterolisthesis of C7 on T1 is present which appears degenerative, associated with facet arthrosis.  Multilevel cervical spondylosis with disc osteophyte complexes and uncovertebral spurring producing right greater than left foraminal encroachment. Thickening of the upper esophagus is present which is nonspecific. This may relate to stasis or reflux.  Consider follow-up endoscopy. There is no cervical spine fracture, subluxation, or dislocation. Severe temporomandibular joint osteoarthritis incidentally noted.  IMPRESSION: No acute osseous abnormality.   Multilevel cervical spondylosis without fracture or dislocation.  Likely degenerative anterolisthesis of C7 on T1 associated with facet arthrosis.  Original Report Authenticated By: Andreas Newport, M.D.   Dg Knee Complete 4 Views Left  08/16/2011  *RADIOLOGY REPORT*  Clinical Data: Left knee pain  LEFT KNEE - COMPLETE 4+ VIEW  Comparison: 02/15/2007  Findings: No fracture or dislocation is seen.  Status post medial compartment partial right knee arthroplasty. 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  Moderate suprapatellar knee joint effusion.  IMPRESSION: No fracture or dislocation is seen.  Medial compartment partial right knee arthroplasty.  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  Moderate suprapatellar knee joint effusion.  Original Report Authenticated By: Charline Bills, M.D.    I personally reviewed the imaging tests through PACS system  I reviewed available ER/hospitalization records thought the EMR   1. Head injury   2. Neck pain   3. Fever   4. Diarrhea   5. Renal insufficiency       MDM  The patient is well-appearing.  Urine and chest show no signs of infection.  The patient has no back pain at the time.  Her left knee appears to be significantly improved from several days ago.There are no signs to suggest  this is a septic arthritis of her left knee.  Doubt epidural abscess.    7:52 AM Despite 1.5 liters of fluid, she is now developed persistent tachycardia in the 120s.  A lactic acid will be sent.  Her blood pressure does drop to 80s systolic.  She be given additional IV fluids and she'll be started on vancomycin and cefepime at the time.  She has an allergy to Zosyn.  Her primary care physicians in [place] so she'll be admitted to the hospitalist        Lyanne Co, MD 08/18/11 1610   Lyanne Co, MD 08/18/11 [phone]

## 2011-08-18 NOTE — Progress Notes (Signed)
Utilization Review Complete  

## 2011-08-18 NOTE — Progress Notes (Signed)
Called by RN. Knee aspirate is now growing out staph species.  Will consult ortho for recs.  I have paged Dr. Hilda Lias.  Await call back. Records from [facility] requested.

## 2011-08-18 NOTE — Discharge Summary (Signed)
Physician Discharge/Transfer Summary  Patient ID: Catherine Mccullough MRN: [id number] DOB/AGE: 65-Jan-1947 65 y.o.  Admit date: 08/18/2011 Discharge date: 08/18/2011  Discharge Diagnoses:  Principal Problem:  *Sepsis, blood cultures growing gram-positive cocci in clusters Active Problems:  Septic joint of left knee joint, arthrocentesis from 08/16/2011 growing MRSA, sensitivities pending. Repeat arthrocentesis done today revealed pus, cultures pending  Dehydration  CHF (congestive heart failure), previous ejection fraction 35%. Repeat echocardiogram today showed ejection fraction 55-60%.  Macrocytic anemia  Breast cancer metastasized to bone  Chronic pain  Thrombocytopenia  CKD (chronic kidney disease)  DM type 2 (diabetes mellitus, type 2), diet controlled  OA (osteoarthritis), with bilateral partial knee arthroplasties  Fall  Essential hypertension  H/O endocarditis, pulmonic valve, beta-hemolytic strep, treated with 6 weeks of IV vancomycin in July of 2012. Today's transthoracic echocardiogram showed no vegetations.   Medication List  As of 08/18/2011  5:54 PM   TAKE these medications         HYDROcodone-acetaminophen 5-325 MG per tablet   Commonly known as: NORCO/VICODIN   Take 1 tablet by mouth every 4 (four) hours as needed for pain.      ondansetron 8 MG disintegrating tablet   Commonly known as: ZOFRAN-ODT   Take 1 tablet (8 mg total) by mouth every 8 (eight) hours as needed for nausea.         ASK your doctor about these medications         acetaminophen 650 MG CR tablet   Commonly known as: TYLENOL   Take 650 mg by mouth every 8 (eight) hours as needed. Arthritis Pain      acetaminophen 500 MG tablet   Commonly known as: TYLENOL   Take 1,000 mg by mouth every 6 (six) hours as needed. Pain      aspirin EC 81 MG tablet   Take 81 mg by mouth daily.      bisacodyl 5 MG EC tablet   Commonly known as: DULCOLAX   Take 5 mg by mouth daily.      bumetanide 1 MG  tablet   Commonly known as: BUMEX   Take 1 mg by mouth as needed. For fluid      calcium-vitamin D 500-200 MG-UNIT per tablet   Commonly known as: OSCAL WITH D   Take 1 tablet by mouth daily.      clonazePAM 0.5 MG tablet   Commonly known as: KLONOPIN   Take 1 tablet by mouth daily.      DENTAGEL 1.1 % Gel dental gel   Generic drug: sodium fluoride   Take 1 application by mouth Daily.      diphenhydramine-acetaminophen 25-500 MG Tabs   Commonly known as: TYLENOL PM   Take 1 tablet by mouth at bedtime as needed. Sleep/Pain      esomeprazole 40 MG capsule   Commonly known as: NEXIUM   Take 40 mg by mouth daily before breakfast.      exemestane 25 MG tablet   Commonly known as: AROMASIN   Take 25 mg by mouth daily.      fentaNYL 25 MCG/HR   Commonly known as: DURAGESIC - dosed mcg/hr   Place 1 patch onto the skin every other day. For pain. **Remove used patches and apply a new patch for each application**  Time for patch to be changed today 01/11/11...      fexofenadine 180 MG tablet   Commonly known as: ALLEGRA   Take 180 mg by mouth daily.  folic acid 1 MG tablet   Commonly known as: FOLVITE   Take 1 mg by mouth daily.      gabapentin 300 MG capsule   Commonly known as: NEURONTIN   Take 300-600 mg by mouth 2 (two) times daily. Take 300mg  every morning and 600mg  every night      HYDROcodone-acetaminophen 10-500 MG per tablet   Commonly known as: LORTAB   Take 1 tablet by mouth Every 8 hours as needed. For pain      lisinopril 5 MG tablet   Commonly known as: PRINIVIL,ZESTRIL   Take 1 tablet by mouth Daily.      magnesium citrate Soln   Take 1 Bottle by mouth once.      metoprolol succinate 25 MG 24 hr tablet   Commonly known as: TOPROL-XL   Take 1 tablet by mouth Daily.      montelukast 10 MG tablet   Commonly known as: SINGULAIR   Take 10 mg by mouth at bedtime.      multivitamins ther. w/minerals Tabs   Take 1 tablet by mouth daily.      ondansetron  8 MG tablet   Commonly known as: ZOFRAN   Take 8 mg by mouth every 8 (eight) hours as needed. nausea      oxyCODONE-acetaminophen 5-325 MG per tablet   Commonly known as: PERCOCET/ROXICET   Take 1-2 tablets by mouth every 6 (six) hours as needed for pain.      UNKNOWN TO PATIENT   Inject 1 application into the articular space every 30 (thirty) days. Patient receives an injection into spine/back area for Degenerative Bone at office (Dr. Theodora Blow)      vitamin C 500 MG tablet   Commonly known as: ASCORBIC ACID   Take 1,000 mg by mouth daily.      vitamin E 400 UNIT capsule   Take 400 Units by mouth daily.           CODE STATUS: Limited code. DO NOT INTUBATE  Disposition: Transferred to [facility] ICU, Dr. Harriett Rush is the accepting physician.  Discharged Condition: Stable  Consults: Treatment Team:  Darreld Mclean, MD  Labs:   Results for orders placed during the hospital encounter of 08/18/11 (from the past 48 hour(s))  CBC     Status: Abnormal   Collection Time   08/18/11  4:39 AM      Component Value Range Comment   WBC 4.4  4.0 - 10.5 K/uL    RBC 3.04 (*) 3.87 - 5.11 MIL/uL    Hemoglobin 10.7 (*) 12.0 - 15.0 g/dL    HCT 45.4 (*) 09.8 - 46.0 %    MCV 105.3 (*) 78.0 - 100.0 fL    MCH 35.2 (*) 26.0 - 34.0 pg    MCHC 33.4  30.0 - 36.0 g/dL    RDW 11.9  14.7 - 82.9 %    Platelets 118 (*) 150 - 400 K/uL   BASIC METABOLIC PANEL     Status: Abnormal   Collection Time   08/18/11  4:39 AM      Component Value Range Comment   Sodium 134 (*) 135 - 145 mEq/L    Potassium 4.9  3.5 - 5.1 mEq/L    Chloride 99  96 - 112 mEq/L    CO2 22  19 - 32 mEq/L    Glucose, Bld 175 (*) 70 - 99 mg/dL    BUN 39 (*) 6 - 23 mg/dL  Creatinine, Ser 1.82 (*) 0.50 - 1.10 mg/dL    Calcium 9.6  8.4 - 16.1 mg/dL    GFR calc non Af Amer 28 (*) >90 mL/min    GFR calc Af Amer 33 (*) >90 mL/min   CULTURE, BLOOD (ROUTINE X 2)     Status: Normal (Preliminary  result)   Collection Time   08/18/11  4:40 AM      Component Value Range Comment   Specimen Description Blood RIGHT ANTECUBITAL      Special Requests BOTTLES DRAWN AEROBIC AND ANAEROBIC 5CC      Culture NO GROWTH <24 HRS      Report Status PENDING     URINALYSIS, WITH MICROSCOPIC     Status: Abnormal   Collection Time   08/18/11  6:24 AM      Component Value Range Comment   Color, Urine YELLOW  YELLOW    APPearance CLEAR  CLEAR    Specific Gravity, Urine 1.015  1.005 - 1.030    pH 5.5  5.0 - 8.0    Glucose, UA NEGATIVE  NEGATIVE mg/dL    Hgb urine dipstick NEGATIVE  NEGATIVE    Bilirubin Urine NEGATIVE  NEGATIVE    Ketones, ur NEGATIVE  NEGATIVE mg/dL    Protein, ur 30 (*) NEGATIVE mg/dL    Urobilinogen, UA 0.2  0.0 - 1.0 mg/dL    Nitrite NEGATIVE  NEGATIVE    Leukocytes, UA NEGATIVE  NEGATIVE    WBC, UA 0-2  <3 WBC/hpf   LACTIC ACID, PLASMA     Status: Normal   Collection Time   08/18/11  7:53 AM      Component Value Range Comment   Lactic Acid, Venous 2.2  0.5 - 2.2 mmol/L   PROCALCITONIN     Status: Normal   Collection Time   08/18/11  7:53 AM      Component Value Range Comment   Procalcitonin 10.35     CULTURE, BLOOD (ROUTINE X 2)     Status: Normal (Preliminary result)   Collection Time   08/18/11  8:00 AM      Component Value Range Comment   Specimen Description Blood RIGHT HAND      Special Requests NONE 6CC      Culture NO GROWTH <24 HRS      Report Status PENDING     HEPATIC FUNCTION PANEL     Status: Abnormal   Collection Time   08/18/11  8:00 AM      Component Value Range Comment   Total Protein 5.7 (*) 6.0 - 8.3 g/dL    Albumin 2.3 (*) 3.5 - 5.2 g/dL    AST 27  0 - 37 U/L    ALT 18  0 - 35 U/L    Alkaline Phosphatase 73  39 - 117 U/L    Total Bilirubin 0.3  0.3 - 1.2 mg/dL    Bilirubin, Direct 0.2  0.0 - 0.3 mg/dL    Indirect Bilirubin 0.1 (*) 0.3 - 0.9 mg/dL   TROPONIN I     Status: Normal   Collection Time   08/18/11  8:40 AM      Component Value Range  Comment   Troponin I <0.30  <0.30 ng/mL   GLUCOSE, CAPILLARY     Status: Normal   Collection Time   08/18/11 11:48 AM      Component Value Range Comment   Glucose-Capillary 71  70 - 99 mg/dL   GLUCOSE, CAPILLARY  Status: Abnormal   Collection Time   08/18/11  5:02 PM      Component Value Range Comment   Glucose-Capillary 69 (*) 70 - 99 mg/dL     Diagnostics:  Dg Chest 2 View  08/18/2011  *RADIOLOGY REPORT*  Clinical Data: Fall.  Fever.  CHEST - 2 VIEW  Comparison: 01/11/2011.  Findings: No pneumothorax.  No displaced rib fractures are identified.  Right apical increased density appears chronic, similar to prior examinations overlying the right anterior first rib.  Presumably this relates to radiation treatment in this patient with history of mastectomy and breast surgery.  Stable prominence of the left hilum.  No airspace disease.  No effusion. Right glenohumeral osteoarthritis.  If the patient has complaints referable to the right shoulder, consider right shoulder radiographs.  Interval removal of right IJ Port-A-Cath.  IMPRESSION: 1.  No acute abnormality. 2.  Interval removal of right IJ Port-A-Cath. 3.  The right upper lobes scarring appears stable compared to prior exam, possibly related to radiation changes.  Original Report Authenticated By: Andreas Newport, M.D.   Ct Head Wo Contrast  08/18/2011  *RADIOLOGY REPORT*  Clinical Data:  Fall.  Head trauma.  CT HEAD WITHOUT CONTRAST CT CERVICAL SPINE WITHOUT CONTRAST  Technique:  Multidetector CT imaging of the head and cervical spine was performed following the standard protocol without intravenous contrast.  Multiplanar CT image reconstructions of the cervical spine were also generated.  Comparison:  07/21/2010.  CT HEAD  Findings: Low attenuation is present in the right temporal tip anteriorly and in the inferior right frontal lobe associated with old trauma.  Benign basal ganglia calcifications.  Mild atrophy and chronic ischemic  periventricular white matter disease.  No mass lesion, mass effect, midline shift, hydrocephalus, or hemorrhage. Visualized paranasal sinuses appear normal.  Scattered fluid is present in the left mastoid air cells.  Intracranial atherosclerosis.  No skull fracture.  Bilateral lens extractions.  IMPRESSION: 1.  No acute intracranial abnormality. 2.  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 Old infarcts can produce a similar appearance. 3.  Mild atrophy and chronic ischemic white matter disease.  CT CERVICAL SPINE  Findings: Small amount of fluid in the left mastoid air cells again noted.  2 mm anterolisthesis of C7 on T1 is present which appears degenerative, associated with facet arthrosis.  Multilevel cervical spondylosis with disc osteophyte complexes and uncovertebral spurring producing right greater than left foraminal encroachment. Thickening of the upper esophagus is present which is nonspecific. This may relate to stasis or reflux.  Consider follow-up endoscopy. There is no cervical spine fracture, subluxation, or dislocation. Severe temporomandibular joint osteoarthritis incidentally noted.  IMPRESSION: No acute osseous abnormality.  Multilevel cervical spondylosis without fracture or dislocation.  Likely degenerative anterolisthesis of C7 on T1 associated with facet arthrosis.  Original Report Authenticated By: Andreas Newport, M.D.   Ct Cervical Spine Wo Contrast  08/18/2011  *RADIOLOGY REPORT*  Clinical Data:  Fall.  Head trauma.  CT HEAD WITHOUT CONTRAST CT CERVICAL SPINE WITHOUT CONTRAST  Technique:  Multidetector CT imaging of the head and cervical spine was performed following the standard protocol without intravenous contrast.  Multiplanar CT image reconstructions of the cervical spine were also generated.  Comparison:  07/21/2010.  CT HEAD  Findings: Low attenuation is present in the right temporal tip anteriorly and in  the inferior right frontal lobe associated with old trauma.  Benign basal ganglia calcifications.  Mild atrophy and chronic ischemic  periventricular white matter disease.  No mass lesion, mass effect, midline shift, hydrocephalus, or hemorrhage. Visualized paranasal sinuses appear normal.  Scattered fluid is present in the left mastoid air cells.  Intracranial atherosclerosis.  No skull fracture.  Bilateral lens extractions.  IMPRESSION: 1.  No acute intracranial abnormality. 2.  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 Old infarcts can produce a similar appearance. 3.  Mild atrophy and chronic ischemic white matter disease.  CT CERVICAL SPINE  Findings: Small amount of fluid in the left mastoid air cells again noted.  2 mm anterolisthesis of C7 on T1 is present which appears degenerative, associated with facet arthrosis.  Multilevel cervical spondylosis with disc osteophyte complexes and uncovertebral spurring producing right greater than left foraminal encroachment. Thickening of the upper esophagus is present which is nonspecific. This may relate to stasis or reflux.  Consider follow-up endoscopy. There is no cervical spine fracture, subluxation, or dislocation. Severe temporomandibular joint osteoarthritis incidentally noted.  IMPRESSION: No acute osseous abnormality.  Multilevel cervical spondylosis without fracture or dislocation.  Likely degenerative anterolisthesis of C7 on T1 associated with facet arthrosis.  Original Report Authenticated By: Andreas Newport, M.D.   Dg Knee Complete 4 Views Left  08/16/2011  *RADIOLOGY REPORT*  Clinical Data: Left knee pain  LEFT KNEE - COMPLETE 4+ VIEW  Comparison: 02/15/2007  Findings: No fracture or dislocation is seen.  Status post medial compartment partial right knee arthroplasty. Moderate to severe tricompartmental degenerative changes, with significant progression in the lateral  compartment when compared to the prior.  Moderate suprapatellar knee joint effusion.  IMPRESSION: No fracture or dislocation is seen.  Medial compartment partial right knee arthroplasty.  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  Moderate suprapatellar knee joint effusion.  Original Report Authenticated By: Charline Bills, M.D.    Procedures: Arthrocentesis, left knee  Echocardiogram: - Left ventricle: The cavity size was normal. Wall thickness was increased in a pattern of mild LVH. Systolic function was normal. The estimated ejection fraction was in the range of 55% to 60%. - Aortic valve: Valve area: 1.33cm^2(VTI). Valve area: 0.91cm^2 (Vmax). - Mitral valve: Calcified annulus. Mildly thickened leaflets . Valve area by pressure half-time: 1.86cm^2. Valve area by continuity equation (using LVOT flow): 1.13cm^2. - Right ventricle: The cavity size was mildly dilated. - Right atrium: The atrium was mildly dilated. - Pulmonary arteries: PA peak pressure: 42mm Hg (S). Transthoracic echocardiography. M-mode, complete 2D, spectral Doppler, and color Doppler. Height: Height: 139.7cm. Height: 55in. Weight: Weight: 50.8kg. Weight: 111.8lb. Body mass index: BMI: 26kg/m^2. Body surface area: BSA: 1.44m^2. Patient status: Inpatient. Location: Bedside.  ------------------------------------------------------------  ------------------------------------------------------------ Left ventricle: The cavity size was normal. Wall thickness was increased in a pattern of mild LVH. Systolic function was normal. The estimated ejection fraction was in the range of 55% to 60%.  ------------------------------------------------------------ Aortic valve: Mildly calcified leaflets. Doppler: VTI ratio of LVOT to aortic valve: 0.86. Valve area: 1.33cm^2(VTI). Indexed valve area: 0.97cm^2/m^2 (VTI). Peak velocity ratio of LVOT to aortic  valve: 0.59. Valve area: 0.91cm^2 (Vmax). Indexed valve area: 0.66cm^2/m^2 (Vmax). Mean gradient: 3mm Hg (S).  ------------------------------------------------------------ Aorta: The aorta was normal, not dilated, and non-diseased.  ------------------------------------------------------------ Mitral valve: Calcified annulus. Mildly thickened leaflets . Doppler: Trivial regurgitation. Valve area by pressure half-time: 1.86cm^2. Indexed valve area by pressure half-time: 1.36cm^2/m^2. Valve area by continuity equation (using LVOT flow): 1.13cm^2. Indexed valve area by continuity equation (using LVOT flow): 0.82cm^2/m^2. Mean  gradient: 2mm Hg (D).  ------------------------------------------------------------ Left atrium: The atrium was at the upper limits of normal in size.  ------------------------------------------------------------ Atrial septum: Poorly visualized.  ------------------------------------------------------------ Right ventricle: The cavity size was mildly dilated.  ------------------------------------------------------------ Pulmonic valve: Doppler: Trivial regurgitation.  ------------------------------------------------------------ Tricuspid valve: Doppler: Mild regurgitation.  ------------------------------------------------------------ Right atrium: The atrium was mildly dilated.  ------------------------------------------------------------ Pericardium: The pericardium was normal in appearance.  ------------------------------------------------------------ Post procedure conclusions Ascending Aorta:  - The aorta was normal, not dilated, and non-diseased.  ------------------------------------------------------------  2D measurements Normal Doppler measurements Normal Left ventricle Main pulmonary LVID ED, 38.6 mm 43-52 artery chord, Pressure, 42 mm Hg =30 PLAX S LVID ES, 31.2 mm 23-38 LVOT chord, Peak vel, 66.8 cm/s ------ PLAX S FS, chord, 19 % >29  VTI, S 12.5 cm ------ PLAX Aortic valve LVPW, ED 8.59 mm ------ Peak vel, 113 cm/s ------ IVS/LVPW 1.7 <1.3 S ratio, ED Mean vel, 86 cm/s ------ Ventricular septum S IVS, ED 14.6 mm ------ VTI, S 14.5 cm ------ LVOT Mean 3 mm Hg ------ Diam, S 14 mm ------ gradient, Area 1.54 cm^2 ------ S Aorta VTI ratio 0.86 ------ Root diam, 21 mm ------ LVOT/AV ED Area, VTI 1.33 cm^2 ------ Left atrium Area index 0.97 cm^2/m ------ AP dim 33 mm ------ (VTI) ^2 AP dim 2.41 cm/m^2 <2.2 Peak vel 0.59 ------ index ratio, LVOT/AV M-mode measurements Normal Area, Vmax 0.91 cm^2 ------ Aorta Area index 0.66 cm^2/m ------ Root diam, 20 mm 20-37 (Vmax) ^2 ED Mitral valve Left atrium Peak E vel 57 cm/s ------ AP dim, ES 38 mm 19-40 Peak A vel 91.6 cm/s ------ AP dim 2.77 cm/m^2 <2.2 Mean vel, 61.3 cm/s ------ index, ES D LA/Ao root 1.9 ------ Decelerati 479 ms 150-23 ratio on time 0 Pressure 118 ms ------ half-time Mean 2 mm Hg ------ gradient, D Peak E/A 0.6 ------ ratio Area (PHT) 1.86 cm^2 ------ Area index 1.36 cm^2/m ------ (PHT) ^2 Area 1.13 cm^2 ------ (LVOT) continuity Area index 0.82 cm^2/m ------ (LVOT ^2 cont) Annulus 17 cm ------ VTI Tricuspid valve Regurg 284 cm/s ------ peak vel Peak RV-RA 32 mm Hg ------ gradient, S Systemic veins Estimated 10 mm Hg ------ CVP Right ventricle Pressure, 42 mm Hg <30 S  ------------------------------------------------------------ Prepared and Electronically Authenticated by  Charlton Haws 2013-08-12T12:45:12.083  Hospital Course: Patient is a 66 year old black female with multiple medical problems who presented to the [facility] emergency room this morning after having fallen and hit her head. She is a vague historian. She was seen in the [facility] emergency room on 08/16/2011 for left knee pain and swelling. She had an arthrocentesis that yielded yellow fluid. She was given pain medication and sent home. This morning, she was  found to have a temperature of over 102 Fahrenheit. Her blood pressure initially was normal but she dropped into the 70s. She had a negative chest x-ray. Negative urinalysis. Venous lactic acid was 2.2. She was given 2 L of saline in the emergency room, as well as vancomycin and cefepime. She was admitted to the hospitalist service and step down. Cultures initially from the arthrocentesis 2 days ago were negative at the time of admission. However, she is now growing MRSA from the initial arthrocentesis. Dr. Hilda Lias, orthopedics, was consulted. He re-tapped her knee, aspirate is purulent. I've just been called by the lab. Todays' Gram stain is showing abundant white cells with gram-positive cocci in clusters and pairs. Also, her blood cultures are growing gram-positive cocci in clusters. Patient will be transferred  to [facility] where she gets all of her care, including orthopedic with Dr. Fabio Bering. She will be transferred to the intensive care unit. I've spoken with Dr. Marvis Moeller who has kindly accepted the patient.  Initially, patient refused central line. After much coaxing she has agreed to a femoral line only. She has a peripheral IV in her left hand and we will place a central line prior to transfer to [facility]. Patient's temperature is currently 100F. Heart rate 106. Blood pressure 127/55. Respiratory rate 20.  SignedChristiane Ha 08/18/2011, 5:54 PM

## 2011-08-18 NOTE — Progress Notes (Addendum)
CGB 69. PT GIVEN ORANGE JUICE W/ SUGAR.  DR KEELING HAS BEEN IN AND DRAWN FLUID FROM PT'S LT KNEE. SPECIEMN SENT TO LAB.  DR Lendell Caprice IN TO TALK W/ PT AND HER FATHER AND SON ABOUT POSSIBLE TRANFER TO [facility] WHERE PT'S ORTHOPEDIST IS. PT IS INTERMITTANTLY CONFUSED. PT HAS NOT BEEN ABLE TO VOID SINCE ADMISSION. #14 FOLEY CATHETER INSERTED W/ CLOUDY PALE YELLOW RETURNED. TOLERATED PROCEDURE WELL.

## 2011-08-18 NOTE — Progress Notes (Signed)
PT HAS AGREED TO HAVE FEMEROL CENTRAL LINE CATHETER INSERTED. DR Lovell Sheehan NOTIFIED AND COMING TO DO PROCEDURE. PT SIGNED CONSENT FOR TRANSFER TO [facility].

## 2011-08-18 NOTE — Procedures (Signed)
Central Venous Catheter Insertion Procedure Note Catherine Mccullough [id number] [birth year]  Procedure: Insertion of Central Venous Catheter Indications: Drug and/or fluid administration, Frequent blood sampling and sepsis.  Procedure Details Consent: Risks of procedure as well as the alternatives and risks of each were explained to the (patient/caregiver).  Consent for procedure obtained. Time Out: Verified patient identification, verified procedure, site/side was marked, verified correct patient position, special equipment/implants available, medications/allergies/relevent history reviewed, required imaging and test results available.  Performed  Maximum sterile technique was used including antiseptics, gloves, gown, hand hygiene, mask and sheet. Skin prep: Chlorhexidine; local anesthetic administered A antimicrobial bonded/coated triple lumen catheter was placed in the right femoral vein due to patient refusal for subclavian approach using the Seldinger technique.  Evaluation Blood flow good Complications: No apparent complications Patient did tolerate procedure well.   Catherine Mccullough A 08/18/2011, 6:33 PM

## 2011-08-18 NOTE — Consult Note (Signed)
ANTIBIOTIC CONSULT NOTE - INITIAL  Pharmacy Consult for Vancomycin Indication: sepsis  Allergies  Allergen Reactions  . Mometasone Shortness Of Breath  . Aspirin Other (See Comments)    "Inflames stomach"  . Contrast Media (Iodinated Diagnostic Agents) Other (See Comments)    Made Heart Stop.   . Cortisone Other (See Comments)    Hold fluid  . Lasix (Furosemide) Other (See Comments)    Paradoxical Response  . Lyrica (Pregabalin) Other (See Comments)    Hold fluid  . Other     Iv bp med unknown.and adhesive tape-silicones  . Prednisone     Sweating   . Sulfamethoxazole Other (See Comments)    Bottomed out platelets  . Ultram (Tramadol Hcl) Other (See Comments)    "Grand mal seizure"  . Codeine Rash  . Dilantin (Phenytoin Sodium) Rash  . Latex Rash  . Zosyn (Piperacillin-Tazobactam In Dex) Rash   Patient Measurements: Height: 4\' 7"  (139.7 cm) Weight: 112 lb 14 oz (51.2 kg) IBW/kg (Calculated) : 34   Vital Signs: Temp: 100 F (37.8 C) (08/12 1047) Temp src: Oral (08/12 1047) BP: 111/51 mmHg (08/12 1100) Pulse Rate: 25  (08/12 1100) Intake/Output from previous day:   Intake/Output from this shift: Total I/O In: 300 [IV Piggyback:300] Out: -   Labs:  Basename 08/18/11 0439  WBC 4.4  HGB 10.7*  PLT 118*  LABCREA --  CREATININE 1.82*   Estimated Creatinine Clearance: 20.2 ml/min (by C-G formula based on Cr of 1.82). No results found for this basename: VANCOTROUGH:2,VANCOPEAK:2,VANCORANDOM:2,GENTTROUGH:2,GENTPEAK:2,GENTRANDOM:2,TOBRATROUGH:2,TOBRAPEAK:2,TOBRARND:2,AMIKACINPEAK:2,AMIKACINTROU:2,AMIKACIN:2, in the last 72 hours   Microbiology: Recent Results (from the past 720 hour(s))  BODY FLUID CULTURE     Status: Normal (Preliminary result)   Collection Time   08/16/11 11:15 AM      Component Value Range Status Comment   Specimen Description KNEE LEFT   Final    Special Requests NONE   Final    Gram Stain     Final    Value: ABUNDANT WBC PRESENT,  PREDOMINANTLY PMN     NO ORGANISMS SEEN   Culture Culture reincubated for better growth   Final    Report Status PENDING   Incomplete   GRAM STAIN     Status: Normal   Collection Time   08/16/11 11:20 AM      Component Value Range Status Comment   Specimen Description OTHER   Final    Special Requests Normal   Final    Gram Stain     Final    Value: WBC PRESENT,BOTH PMN AND MONONUCLEAR Gram Stain Report Called to,Read Back By and Verified With:  BELTON, K. AT 12:47PM ON 08/16/11 BY PRUITT, C.   Report Status 08/16/2011 FINAL   Final   CULTURE, BLOOD (ROUTINE X 2)     Status: Normal (Preliminary result)   Collection Time   08/18/11  4:40 AM      Component Value Range Status Comment   Specimen Description Blood RIGHT ANTECUBITAL   Final    Special Requests BOTTLES DRAWN AEROBIC AND ANAEROBIC 5CC   Final    Culture NO GROWTH <24 HRS   Final    Report Status PENDING   Incomplete   CULTURE, BLOOD (ROUTINE X 2)     Status: Normal (Preliminary result)   Collection Time   08/18/11  8:00 AM      Component Value Range Status Comment   Specimen Description Blood RIGHT HAND   Final    Special Requests NONE 6CC  Final    Culture NO GROWTH <24 HRS   Final    Report Status PENDING   Incomplete    Medical History: Past Medical History  Diagnosis Date  . Kidney disease   . Hypertension   . Diabetes mellitus   . 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 Arthritis   . CHF (congestive heart failure)   . Lymphedema of leg   . Anxiety    Medications:  Scheduled:    . sodium chloride   Intravenous STAT  . acetaminophen  650 mg Oral Once  . aspirin EC  81 mg Oral Daily  . bisacodyl  5 mg Oral Daily  . ceFEPime (MAXIPIME) IV  1 g Intravenous Q12H  . ceFEPIme (MAXIPIME) 2 GM IVP  2 g Intravenous Once  . clonazePAM  0.5 mg Oral QHS  . exemestane  25 mg Oral Daily  . fentaNYL  25 mcg Transdermal QODAY  . folic acid  1 mg Oral Daily  . gabapentin  300 mg Oral Daily   And  . gabapentin  600 mg Oral  QHS  . heparin  5,000 Units Subcutaneous Q8H  . ibuprofen  600 mg Oral Once  . insulin aspart  0-5 Units Subcutaneous QHS  . insulin aspart  0-9 Units Subcutaneous TID WC  . montelukast  10 mg Oral QHS  .  morphine injection  4 mg Intravenous Once  . oxyCODONE-acetaminophen      . pantoprazole  40 mg Oral Daily  . sodium chloride  1,000 mL Intravenous Once  . sodium chloride  1,000 mL Intravenous Once  . sodium chloride  500 mL Intravenous Once  . sodium chloride  3 mL Intravenous Q12H  . vancomycin  1,000 mg Intravenous Once  . vancomycin  1,000 mg Intravenous Q24H  . DISCONTD: gabapentin  300-600 mg Oral BID   Assessment: 65yoF admitted to ICU with sepsis.  Small body habitus and poor renal fxn.  Goal of Therapy:  Vancomycin trough level 15-20 mcg/ml  Plan: Vancomycin 1gm iv q24hrs Check trough at steady state Monitor labs, renal fxn, and cultures per protocol  Valrie Hart A 08/18/2011,11:30 AM

## 2011-08-18 NOTE — Progress Notes (Signed)
Patient transferred at 2020 to [facility] via care link.  Vitals stable at time of transfer.  Patient alert but confused to place and time.  Father Mr. Pricilla Holm at bs at time of transfer.

## 2011-08-18 NOTE — Consult Note (Signed)
Reason for Consult:Left knee infection Referring Physician: Teresita Fanton is an 66 y.o. female.  HPI: Patient is in ICU, very ill.  She is post a partial (medial) total knee replacement on the left about 2 years ago or so at [facility] by Dr. Randall An.  She did well.  She has bilateral partial knee replacement.  She presented with pain in the left knee two days ago.  Her knee was aspirated in the ER.  Dr. Lendell Caprice called me a short time ago and said the cultures show MRSA infection from the cultures.  The patient has some knee pain and the knee has swollen up again.  She has no chills.  She has no cuts or wounds around the knee.  She has multiple medical problems and is ill.  She has no pain in the right knee.  Past Medical History  Diagnosis Date  . Kidney disease   . Hypertension   . Diabetes mellitus   . 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 Arthritis   . CHF (congestive heart failure)   . Lymphedema of leg   . Anxiety     Past Surgical History  Procedure Date  . Breast surgery   . Cholecystectomy   . Abdominal hysterectomy   . Replacement total knee bilateral   . Hand tendon surgery     History reviewed. No pertinent family history.  Social History:  reports that she has never smoked. She does not have any smokeless tobacco history on file. She reports that she does not drink alcohol or use illicit drugs.  Allergies:  Allergies  Allergen Reactions  . Mometasone Shortness Of Breath  . Aspirin Other (See Comments)    "Inflames stomach"  . Contrast Media (Iodinated Diagnostic Agents) Other (See Comments)    Made Heart Stop.   . Cortisone Other (See Comments)    Hold fluid  . Lasix (Furosemide) Other (See Comments)    Paradoxical Response  . Lyrica (Pregabalin) Other (See Comments)    Hold fluid  . Other     Iv bp med unknown.and adhesive tape-silicones  . Prednisone     Sweating   . Sulfamethoxazole Other (See Comments)    Bottomed out  platelets  . Ultram (Tramadol Hcl) Other (See Comments)    "Grand mal seizure"  . Codeine Rash  . Dilantin (Phenytoin Sodium) Rash  . Latex Rash  . Zosyn (Piperacillin-Tazobactam In Dex) Rash    Medications: I have reviewed the patient's current medications.  Results for orders placed during the hospital encounter of 08/18/11 (from the past 48 hour(s))  CBC     Status: Abnormal   Collection Time   08/18/11  4:39 AM      Component Value Range Comment   WBC 4.4  4.0 - 10.5 K/uL    RBC 3.04 (*) 3.87 - 5.11 MIL/uL    Hemoglobin 10.7 (*) 12.0 - 15.0 g/dL    HCT 45.4 (*) 09.8 - 46.0 %    MCV 105.3 (*) 78.0 - 100.0 fL    MCH 35.2 (*) 26.0 - 34.0 pg    MCHC 33.4  30.0 - 36.0 g/dL    RDW 11.9  14.7 - 82.9 %    Platelets 118 (*) 150 - 400 K/uL   BASIC METABOLIC PANEL     Status: Abnormal   Collection Time   08/18/11  4:39 AM      Component Value Range Comment  Sodium 134 (*) 135 - 145 mEq/L    Potassium 4.9  3.5 - 5.1 mEq/L    Chloride 99  96 - 112 mEq/L    CO2 22  19 - 32 mEq/L    Glucose, Bld 175 (*) 70 - 99 mg/dL    BUN 39 (*) 6 - 23 mg/dL    Creatinine, Ser 1.61 (*) 0.50 - 1.10 mg/dL    Calcium 9.6  8.4 - 09.6 mg/dL    GFR calc non Af Amer 28 (*) >90 mL/min    GFR calc Af Amer 33 (*) >90 mL/min   CULTURE, BLOOD (ROUTINE X 2)     Status: Normal (Preliminary result)   Collection Time   08/18/11  4:40 AM      Component Value Range Comment   Specimen Description Blood RIGHT ANTECUBITAL      Special Requests BOTTLES DRAWN AEROBIC AND ANAEROBIC 5CC      Culture NO GROWTH <24 HRS      Report Status PENDING     URINALYSIS, WITH MICROSCOPIC     Status: Abnormal   Collection Time   08/18/11  6:24 AM      Component Value Range Comment   Color, Urine YELLOW  YELLOW    APPearance CLEAR  CLEAR    Specific Gravity, Urine 1.015  1.005 - 1.030    pH 5.5  5.0 - 8.0    Glucose, UA NEGATIVE  NEGATIVE mg/dL    Hgb urine dipstick NEGATIVE  NEGATIVE    Bilirubin Urine NEGATIVE  NEGATIVE     Ketones, ur NEGATIVE  NEGATIVE mg/dL    Protein, ur 30 (*) NEGATIVE mg/dL    Urobilinogen, UA 0.2  0.0 - 1.0 mg/dL    Nitrite NEGATIVE  NEGATIVE    Leukocytes, UA NEGATIVE  NEGATIVE    WBC, UA 0-2  <3 WBC/hpf   LACTIC ACID, PLASMA     Status: Normal   Collection Time   08/18/11  7:53 AM      Component Value Range Comment   Lactic Acid, Venous 2.2  0.5 - 2.2 mmol/L   PROCALCITONIN     Status: Normal   Collection Time   08/18/11  7:53 AM      Component Value Range Comment   Procalcitonin 10.35     CULTURE, BLOOD (ROUTINE X 2)     Status: Normal (Preliminary result)   Collection Time   08/18/11  8:00 AM      Component Value Range Comment   Specimen Description Blood RIGHT HAND      Special Requests NONE 6CC      Culture NO GROWTH <24 HRS      Report Status PENDING     HEPATIC FUNCTION PANEL     Status: Abnormal   Collection Time   08/18/11  8:00 AM      Component Value Range Comment   Total Protein 5.7 (*) 6.0 - 8.3 g/dL    Albumin 2.3 (*) 3.5 - 5.2 g/dL    AST 27  0 - 37 U/L    ALT 18  0 - 35 U/L    Alkaline Phosphatase 73  39 - 117 U/L    Total Bilirubin 0.3  0.3 - 1.2 mg/dL    Bilirubin, Direct 0.2  0.0 - 0.3 mg/dL    Indirect Bilirubin 0.1 (*) 0.3 - 0.9 mg/dL   TROPONIN I     Status: Normal   Collection Time   08/18/11  8:40 AM  Component Value Range Comment   Troponin I <0.30  <0.30 ng/mL   GLUCOSE, CAPILLARY     Status: Normal   Collection Time   08/18/11 11:48 AM      Component Value Range Comment   Glucose-Capillary 71  70 - 99 mg/dL     Dg Chest 2 View  6/44/0347  *RADIOLOGY REPORT*  Clinical Data: Fall.  Fever.  CHEST - 2 VIEW  Comparison: 01/11/2011.  Findings: No pneumothorax.  No displaced rib fractures are identified.  Right apical increased density appears chronic, similar to prior examinations overlying the right anterior first rib.  Presumably this relates to radiation treatment in this patient with history of mastectomy and breast surgery.  Stable  prominence of the left hilum.  No airspace disease.  No effusion. Right glenohumeral osteoarthritis.  If the patient has complaints referable to the right shoulder, consider right shoulder radiographs.  Interval removal of right IJ Port-A-Cath.  IMPRESSION: 1.  No acute abnormality. 2.  Interval removal of right IJ Port-A-Cath. 3.  The right upper lobes scarring appears stable compared to prior exam, possibly related to radiation changes.  Original Report Authenticated By: Andreas Newport, M.D.   Ct Head Wo Contrast  08/18/2011  *RADIOLOGY REPORT*  Clinical Data:  Fall.  Head trauma.  CT HEAD WITHOUT CONTRAST CT CERVICAL SPINE WITHOUT CONTRAST  Technique:  Multidetector CT imaging of the head and cervical spine was performed following the standard protocol without intravenous contrast.  Multiplanar CT image reconstructions of the cervical spine were also generated.  Comparison:  07/21/2010.  CT HEAD  Findings: Low attenuation is present in the right temporal tip anteriorly and in the inferior right frontal lobe associated with old trauma.  Benign basal ganglia calcifications.  Mild atrophy and chronic ischemic periventricular white matter disease.  No mass lesion, mass effect, midline shift, hydrocephalus, or hemorrhage. Visualized paranasal sinuses appear normal.  Scattered fluid is present in the left mastoid air cells.  Intracranial atherosclerosis.  No skull fracture.  Bilateral lens extractions.  IMPRESSION: 1.  No acute intracranial abnormality. 2.  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 Old infarcts can produce a similar appearance. 3.  Mild atrophy and chronic ischemic white matter disease.  CT CERVICAL SPINE  Findings: Small amount of fluid in the left mastoid air cells again noted.  2 mm anterolisthesis of C7 on T1 is present which appears degenerative, associated with facet arthrosis.  Multilevel cervical spondylosis with disc  osteophyte complexes and uncovertebral spurring producing right greater than left foraminal encroachment. Thickening of the upper esophagus is present which is nonspecific. This may relate to stasis or reflux.  Consider follow-up endoscopy. There is no cervical spine fracture, subluxation, or dislocation. Severe temporomandibular joint osteoarthritis incidentally noted.  IMPRESSION: No acute osseous abnormality.  Multilevel cervical spondylosis without fracture or dislocation.  Likely degenerative anterolisthesis of C7 on T1 associated with facet arthrosis.  Original Report Authenticated By: Andreas Newport, M.D.   Ct Cervical Spine Wo Contrast  08/18/2011  *RADIOLOGY REPORT*  Clinical Data:  Fall.  Head trauma.  CT HEAD WITHOUT CONTRAST CT CERVICAL SPINE WITHOUT CONTRAST  Technique:  Multidetector CT imaging of the head and cervical spine was performed following the standard protocol without intravenous contrast.  Multiplanar CT image reconstructions of the cervical spine were also generated.  Comparison:  07/21/2010.  CT HEAD  Findings: Low attenuation is present in the right temporal tip anteriorly and in the inferior right  frontal lobe associated with old trauma.  Benign basal ganglia calcifications.  Mild atrophy and chronic ischemic periventricular white matter disease.  No mass lesion, mass effect, midline shift, hydrocephalus, or hemorrhage. Visualized paranasal sinuses appear normal.  Scattered fluid is present in the left mastoid air cells.  Intracranial atherosclerosis.  No skull fracture.  Bilateral lens extractions.  IMPRESSION: 1.  No acute intracranial abnormality. 2.  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 Old infarcts can produce a similar appearance. 3.  Mild atrophy and chronic ischemic white matter disease.  CT CERVICAL SPINE  Findings: Small amount of fluid in the left mastoid air cells again noted.  2 mm  anterolisthesis of C7 on T1 is present which appears degenerative, associated with facet arthrosis.  Multilevel cervical spondylosis with disc osteophyte complexes and uncovertebral spurring producing right greater than left foraminal encroachment. Thickening of the upper esophagus is present which is nonspecific. This may relate to stasis or reflux.  Consider follow-up endoscopy. There is no cervical spine fracture, subluxation, or dislocation. Severe temporomandibular joint osteoarthritis incidentally noted.  IMPRESSION: No acute osseous abnormality.  Multilevel cervical spondylosis without fracture or dislocation.  Likely degenerative anterolisthesis of C7 on T1 associated with facet arthrosis.  Original Report Authenticated By: Andreas Newport, M.D.    Review of Systems  Constitutional: Positive for weight loss.  HENT: Negative.   Eyes: Negative.   Respiratory: Negative.   Cardiovascular: Negative.   Gastrointestinal: Negative.   Genitourinary:       History of chronic kidney disease  Musculoskeletal: Positive for joint pain (Left knee pain.  Just for a few days.).  Skin: Negative.   Neurological: Positive for weakness.  Endo/Heme/Allergies:       Positive for breast cancer with metastasis.  History of thrombocytopenia and macrocytic anemia.  Psychiatric/Behavioral: Negative.    Blood pressure 92/34, pulse 110, temperature 100 F (37.8 C), temperature source Oral, resp. rate 18, height 4\' 7"  (1.397 m), weight 51.2 kg (112 lb 14 oz), SpO2 100.00%. Physical Exam  Constitutional: She is oriented to person, place, and time. She appears well-developed and well-nourished.  HENT:  Head: Atraumatic.  Eyes: Pupils are equal, round, and reactive to light.  Neck: Normal range of motion.  Cardiovascular: Normal rate and regular rhythm.   Respiratory: Effort normal and breath sounds normal.  GI: Soft.  Musculoskeletal: She exhibits edema and tenderness (Her left knee is tender and has effusion.   ROM is 0 to 75 with pain.  NV is intact. The knee is stable.  The skin is not warm.  There is just the healed surgical wound with no discharge or skin changes.).  Neurological: She is alert and oriented to person, place, and time. She has normal reflexes.  Skin: Skin is warm and dry.  Psychiatric: She has a normal mood and affect. Her behavior is normal.    Assessment/Plan: I have aspirated the left knee.  She has an effusion.  Grossly purulent brownish material was aspirated, about 25 to 30 cc, thick.  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  Systemic infection with MRSA.  I have talked to Dr. Lendell Caprice.  The patient should be seen at [facility] for further Rx.  Kee Drudge 08/18/2011, 4:23 PM

## 2011-08-18 NOTE — Progress Notes (Signed)
Reviewed records from [facility] and discussed with patient's primary care provider, Monna Fam, third year resident in family medicine. 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 She was treated with 6 weeks of vancomycin and July of 2012. She has had recurrent admissions for renal failure and hypotension. Her Port-A-Cath was removed at the time of the endocarditis because of continued fevers. At one point, her ejection fraction was 35%, but on today's echocardiogram is normal. Previously, she had severe pulmonary valve regurgitation but today's echo shows trivial regurgitation and No vegetations found. Patient may require transfer to [facility] where her orthopedist is, as well as primary care providers, cardiologist, oncologist. However, will asked Dr. Hilda Lias to make any recommendations. Patient will stay here in the step down unit at [facility] tonight. If transfer to intermediate level of care is required, the hospitalist will admit. If the patient stabilizes and can go to the floor, then the family practice service will admit. Total non-face-to-face time 60 minutes.

## 2011-08-18 NOTE — Progress Notes (Signed)
CRITICAL VALUE ALERT  Critical value received:  Blood culture gram + cocci in clusters  Date of notification:  08/18/11  Time of notification:  1708  Critical value read back: yes  Nurse who received alert:  Jannifer Rodney, RN  MD notified (1st page):  MD on floor. Dr. Lendell Caprice.   Time of first page:  Not paged. MD on floor  MD notified (2nd page):  Time of second page:  Responding MD:  Dr. Lendell Caprice  Time MD responded:  1710

## 2011-08-18 NOTE — Progress Notes (Signed)
DR Lendell Caprice NOTIFED OF  KNEE CULTURE POSITIVE FOR MRSA. CONTACT PRECAUTIONS INITIATED. PT IS SLIGHTLY LETHARGIC,BUT AROUSEABLE.   DR Lendell Caprice IN TO EXAMINE.  DR Lendell Caprice SPOKE W/ DR Hilda Lias. HE WILL BE CONSULTING.

## 2011-08-18 NOTE — ED Notes (Signed)
Several attempts for iv start x 2 RN's without success.  Dr Patria Mane  Made aware.

## 2011-08-19 ENCOUNTER — Ambulatory Visit (HOSPITAL_COMMUNITY): Payer: PRIVATE HEALTH INSURANCE | Admitting: Specialist

## 2011-08-19 LAB — BODY FLUID CULTURE

## 2011-08-19 LAB — URINE CULTURE: Culture: NO GROWTH

## 2011-08-21 ENCOUNTER — Ambulatory Visit (HOSPITAL_COMMUNITY): Payer: PRIVATE HEALTH INSURANCE | Admitting: Physical Therapy

## 2011-08-21 LAB — CULTURE, BLOOD (ROUTINE X 2)

## 2011-08-21 LAB — BODY FLUID CULTURE

## 2011-08-26 ENCOUNTER — Ambulatory Visit (HOSPITAL_COMMUNITY): Payer: PRIVATE HEALTH INSURANCE | Admitting: Physical Therapy

## 2011-08-28 ENCOUNTER — Ambulatory Visit (HOSPITAL_COMMUNITY): Payer: PRIVATE HEALTH INSURANCE | Admitting: Specialist

## 2011-09-02 ENCOUNTER — Ambulatory Visit (HOSPITAL_COMMUNITY): Payer: PRIVATE HEALTH INSURANCE | Admitting: Occupational Therapy

## 2011-09-04 ENCOUNTER — Ambulatory Visit (HOSPITAL_COMMUNITY): Payer: PRIVATE HEALTH INSURANCE | Admitting: Specialist

## 2012-05-22 ENCOUNTER — Encounter (HOSPITAL_COMMUNITY): Payer: Self-pay

## 2012-05-22 ENCOUNTER — Emergency Department (HOSPITAL_COMMUNITY)
Admission: EM | Admit: 2012-05-22 | Discharge: 2012-05-22 | Disposition: A | Discharge status: Home / Self Care | Payer: PRIVATE HEALTH INSURANCE | Attending: Emergency Medicine | Admitting: Emergency Medicine

## 2012-05-22 DIAGNOSIS — Z8719 Personal history of other diseases of the digestive system: Secondary | ICD-10-CM | POA: Insufficient documentation

## 2012-05-22 DIAGNOSIS — I509 Heart failure, unspecified: Secondary | ICD-10-CM | POA: Insufficient documentation

## 2012-05-22 DIAGNOSIS — J329 Chronic sinusitis, unspecified: Secondary | ICD-10-CM | POA: Insufficient documentation

## 2012-05-22 DIAGNOSIS — Z79899 Other long term (current) drug therapy: Secondary | ICD-10-CM | POA: Insufficient documentation

## 2012-05-22 DIAGNOSIS — Z87442 Personal history of urinary calculi: Secondary | ICD-10-CM | POA: Insufficient documentation

## 2012-05-22 DIAGNOSIS — I10 Essential (primary) hypertension: Secondary | ICD-10-CM | POA: Insufficient documentation

## 2012-05-22 DIAGNOSIS — Z7982 Long term (current) use of aspirin: Secondary | ICD-10-CM | POA: Insufficient documentation

## 2012-05-22 DIAGNOSIS — Z853 Personal history of malignant neoplasm of breast: Secondary | ICD-10-CM | POA: Insufficient documentation

## 2012-05-22 DIAGNOSIS — Z96659 Presence of unspecified artificial knee joint: Secondary | ICD-10-CM | POA: Insufficient documentation

## 2012-05-22 DIAGNOSIS — F411 Generalized anxiety disorder: Secondary | ICD-10-CM | POA: Insufficient documentation

## 2012-05-22 DIAGNOSIS — Z91040 Latex allergy status: Secondary | ICD-10-CM | POA: Insufficient documentation

## 2012-05-22 DIAGNOSIS — M25569 Pain in unspecified knee: Secondary | ICD-10-CM | POA: Insufficient documentation

## 2012-05-22 DIAGNOSIS — E119 Type 2 diabetes mellitus without complications: Secondary | ICD-10-CM | POA: Insufficient documentation

## 2012-05-22 DIAGNOSIS — Z85830 Personal history of malignant neoplasm of bone: Secondary | ICD-10-CM | POA: Insufficient documentation

## 2012-05-22 DIAGNOSIS — M129 Arthropathy, unspecified: Secondary | ICD-10-CM | POA: Insufficient documentation

## 2012-05-22 DIAGNOSIS — M549 Dorsalgia, unspecified: Secondary | ICD-10-CM | POA: Insufficient documentation

## 2012-05-22 MED ORDER — AZITHROMYCIN 250 MG PO TABS: 250.0000 mg | ORAL_TABLET | Freq: Every day | ORAL | Status: DC

## 2012-05-22 MED ORDER — OXYCODONE-ACETAMINOPHEN 5-325 MG PO TABS: 2.0000 | ORAL_TABLET | ORAL | Status: DC | PRN

## 2012-05-22 NOTE — ED Notes (Signed)
Pt reports being sick w/ multiple complaints since yesterday. Back pain, leg pain, and sinus pain since yesterday. "legs just fell weak"

## 2012-05-22 NOTE — ED Provider Notes (Signed)
History  This chart was scribed for Geoffery Lyons, MD by Bennett Scrape, ED Scribe. This patient was seen in room APA12/APA12 and the patient's care was started at 4:13 PM.  CSN: [id number]  Arrival date & time 05/22/12  1552   First MD Initiated Contact with Patient 05/22/12 1613      Chief Complaint  Patient presents with  . Back Pain  . Facial Pain  . Leg Pain    The history is provided by the patient. No language interpreter was used.    HPI Comments: Catherine Mccullough is a 67 y.o. female who presents to the Emergency Department complaining of gradual onset, gradually worsening, constant left knee pain with associated worsening of chronic back pain, facial pain from sinuses, HA and sneezing that started yesterday. The knee pain is worse with bending but she denies having any complications with ambulating. She reports that she is on singular for her allergies currently with no improvement. 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 She reports that she had the MRSA surgery at [facility] and was then transferred to [facility] where she stayed for one while in ICU when the infection traveled "to my heart".  She reports that she was seen by Dr. Pervis Hocking one month ago and taken off of the antibiotic. She states that since then she hasn't been able to stand on it for a prolonged period of time and reports that she is "dragging the leg" behind her. She is unsure if the symptoms are similar to the MRSA episode. 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 She states that she is on 325 hydrocodone pain medication with no improvement. She states that she has been on the pain medication "a long time". She denies emesis, diarrhea and SOB as associated symptoms. She also has a h/o HTN, DM and CHF. Pt denies smoking and alcohol use.   Past Medical History  Diagnosis Date  . Kidney disease   . Hypertension   . Diabetes mellitus    . 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 Arthritis   . CHF (congestive heart failure)   . Lymphedema of leg   . Anxiety     Past Surgical History  Procedure Laterality Date  . Breast surgery    . Cholecystectomy    . Abdominal hysterectomy    . Replacement total knee bilateral    . Hand tendon surgery      No family history on file.  History  Substance Use Topics  . Smoking status: Never Smoker   . Smokeless tobacco: Not on file  . Alcohol Use: No    No OB history provided.  Review of Systems  A complete 10 system review of systems was obtained and all systems are negative except as noted in the HPI and PMH.   Allergies  Mometasone; Aspirin; Contrast media; Cortisone; Lasix; Lyrica; Other; Prednisone; Sulfamethoxazole; Ultram; Codeine; Dilantin; Latex; and Zosyn  Home Medications   Current Outpatient Rx  Name  Route  Sig  Dispense  Refill  . acetaminophen (TYLENOL) 500 MG tablet   Oral   Take 1,000 mg by mouth every 6 (six) hours as needed. Pain         . acetaminophen (TYLENOL) 650 MG CR tablet   Oral   Take 650 mg by mouth every 8 (eight) hours as needed. Arthritis Pain         .  aspirin EC 81 MG tablet   Oral   Take 81 mg by mouth daily.           . bisacodyl (DULCOLAX) 5 MG EC tablet   Oral   Take 5 mg by mouth daily.         . bumetanide (BUMEX) 1 MG tablet   Oral   Take 1 mg by mouth as needed. For fluid         . calcium-vitamin D (OSCAL WITH D) 500-200 MG-UNIT per tablet   Oral   Take 1 tablet by mouth daily.         . clonazePAM (KLONOPIN) 0.5 MG tablet   Oral   Take 1 tablet by mouth daily.          . DENTAGEL 1.1 % GEL dental gel   Oral   Take 1 application by mouth Daily.         . diphenhydramine-acetaminophen (TYLENOL PM) 25-500 MG TABS   Oral   Take 1 tablet by mouth at bedtime as needed. Sleep/Pain         . esomeprazole (NEXIUM) 40 MG capsule   Oral   Take 40 mg by mouth daily before breakfast.           .  exemestane (AROMASIN) 25 MG tablet   Oral   Take 25 mg by mouth daily.           . fentaNYL (DURAGESIC - DOSED MCG/HR) 25 MCG/HR   Transdermal   Place 1 patch onto the skin every other day. For pain. **Remove used patches and apply a new patch for each application**  Time for patch to be changed today 01/11/11...         . fexofenadine (ALLEGRA) 180 MG tablet   Oral   Take 180 mg by mouth daily.           . folic acid (FOLVITE) 1 MG tablet   Oral   Take 1 mg by mouth daily.         Marland Kitchen gabapentin (NEURONTIN) 300 MG capsule   Oral   Take 300-600 mg by mouth 2 (two) times daily. Take 300mg  every morning and 600mg  every night         . HYDROcodone-acetaminophen (LORTAB) 10-500 MG per tablet   Oral   Take 1 tablet by mouth Every 8 hours as needed. For pain         . lisinopril (PRINIVIL,ZESTRIL) 5 MG tablet   Oral   Take 1 tablet by mouth Daily.         . magnesium citrate SOLN   Oral   Take 1 Bottle by mouth once.         . metoprolol succinate (TOPROL-XL) 25 MG 24 hr tablet   Oral   Take 1 tablet by mouth Daily.         . montelukast (SINGULAIR) 10 MG tablet   Oral   Take 10 mg by mouth at bedtime.           . Multiple Vitamins-Minerals (MULTIVITAMINS THER. W/MINERALS) TABS   Oral   Take 1 tablet by mouth daily.           . ondansetron (ZOFRAN) 8 MG tablet   Oral   Take 8 mg by mouth every 8 (eight) hours as needed. nausea          . UNKNOWN TO PATIENT   Intra-articular   Inject 1 application into the articular  space every 30 (thirty) days. Patient receives an injection into spine/back area for Degenerative Bone at office (Dr. Theodora Blow)         . vitamin C (ASCORBIC ACID) 500 MG tablet   Oral   Take 1,000 mg by mouth daily.         . vitamin E 400 UNIT capsule   Oral   Take 400 Units by mouth daily.           Triage Vitals: BP 137/73  Pulse 68  Temp(Src) 98.7 F (37.1 C) (Oral)  Resp 18  Ht 4\' 7"  (1.397 m)  Wt 105 lb (47.628 kg)   BMI 24.4 kg/m2  SpO2 100%  Physical Exam  Nursing note and vitals reviewed. Constitutional: She is oriented to person, place, and time. She appears well-developed and well-nourished. No distress.  HENT:  Head: Normocephalic and atraumatic.  There is some maxilary sinus tenderness  Eyes: Conjunctivae and EOM are normal.  Neck: Neck supple. No tracheal deviation present.  Cardiovascular: Normal rate and regular rhythm.   Pulmonary/Chest: Effort normal and breath sounds normal. No respiratory distress.  Abdominal: Soft. There is no tenderness.  Musculoskeletal: Normal range of motion.  There is tenderness to palpation in the lumbar region. Bilateral knees have prior surgical scars. There is no erythema or redness. There is no effusion. Both knees have good ROM without significant discomfort.   Neurological: She is alert and oriented to person, place, and time.  Skin: Skin is warm and dry.  Psychiatric: She has a normal mood and affect. Her behavior is normal.    ED Course  Procedures (including critical care time)  DIAGNOSTIC STUDIES: Oxygen Saturation is 100% on room air, normal by my interpretation.    COORDINATION OF CARE: 4:23 PM-Discussed treatment plan which includes medication for pain and sinuses with pt at bedside and pt agreed to plan. Advised pt that she needs to f/u with her orthopedist for the back and knee pain.   Labs Reviewed - No data to display No results found.   No diagnosis found.    MDM  Will treat as sinusitis, prescribe pain meds for her other various complaints.  Does not appear to be anything emergent.  To follow up with pcp.   I personally performed the services described in this documentation, which was scribed in my presence. The recorded information has been reviewed and is accurate.         Geoffery Lyons, MD 05/23/12 [phone]

## 2012-05-22 NOTE — ED Notes (Signed)
Patient with no complaints at this time. Respirations even and unlabored. Skin warm/dry. Discharge instructions reviewed with patient at this time. Patient given opportunity to voice concerns/ask questions. Patient discharged at this time and left Emergency Department with steady gait.   

## 2012-09-08 ENCOUNTER — Non-Acute Institutional Stay (SKILLED_NURSING_FACILITY): Payer: PRIVATE HEALTH INSURANCE | Admitting: Internal Medicine

## 2012-09-08 DIAGNOSIS — Z96652 Presence of left artificial knee joint: Secondary | ICD-10-CM

## 2012-09-08 DIAGNOSIS — C7951 Secondary malignant neoplasm of bone: Secondary | ICD-10-CM

## 2012-09-08 DIAGNOSIS — C50919 Malignant neoplasm of unspecified site of unspecified female breast: Secondary | ICD-10-CM

## 2012-09-08 DIAGNOSIS — I509 Heart failure, unspecified: Secondary | ICD-10-CM

## 2012-09-08 DIAGNOSIS — I10 Essential (primary) hypertension: Secondary | ICD-10-CM

## 2012-09-08 DIAGNOSIS — E119 Type 2 diabetes mellitus without complications: Secondary | ICD-10-CM

## 2012-09-08 DIAGNOSIS — Z96659 Presence of unspecified artificial knee joint: Secondary | ICD-10-CM | POA: Insufficient documentation

## 2012-09-08 DIAGNOSIS — T82898A Other specified complication of vascular prosthetic devices, implants and grafts, initial encounter: Secondary | ICD-10-CM

## 2012-09-08 DIAGNOSIS — T829XXA Unspecified complication of cardiac and vascular prosthetic device, implant and graft, initial encounter: Secondary | ICD-10-CM

## 2012-09-08 DIAGNOSIS — Z8614 Personal history of Methicillin resistant Staphylococcus aureus infection: Secondary | ICD-10-CM | POA: Insufficient documentation

## 2012-09-08 NOTE — Progress Notes (Signed)
MRN: [id number] Name: Catherine Mccullough  Sex: female Age: 67 y.o. DOB: 09/13/1945  PSC #: Son[facility] Dandy Facility/Room: 116 Level Of Care: SNF Provider: Merrilee Seashore D Emergency Contacts: Extended Emergency Contact Information Primary Emergency Contact: Laqueta [place] Home Phone: [phone] Relation: Son Secondary Emergency Contact: Karena Addison Address: [address]          [place], [place] [address] [place] Home Phone: [phone] Mobile Phone: [phone] Relation: Father  Code Status: FULL (updated 8/28 at [facility])  Allergies: Mometasone; Aspirin; Contrast media; Cortisone; Insulins; Lasix; Lyrica; Other; Prednisone; Sulfamethoxazole; Ultram; Codeine; Dilantin; Latex; and Zosyn  Chief Complaint  Patient presents with  . nursing home admission    HPI: Patient is 67 y.o. female who had a revision done to L knee arthroplasty on 8/28/2014and is admitted for IV antibiotic therapy and PT/OT.  Past Medical History  Diagnosis Date  . Kidney disease   . Hypertension   . Diabetes mellitus   . 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 Arthritis   . CHF (congestive heart failure)   . Lymphedema of leg   . Anxiety     Past Surgical History  Procedure Laterality Date  . Breast surgery    . Cholecystectomy    . Abdominal hysterectomy    . Replacement total knee bilateral    . Hand tendon surgery        Medication List       This list is accurate as of: 09/08/12 11:59 PM.  Always use your most recent med list.               acetaminophen 500 MG tablet  Commonly known as:  TYLENOL  Take 1,000 mg by mouth every 6 (six) hours as needed. Pain     bumetanide 1 MG tablet  Commonly known as:  BUMEX  Take 1 mg by mouth 2 (two) times daily.     calcium-vitamin D 500-200 MG-UNIT per tablet  Commonly known as:  OSCAL WITH D  Take 1 tablet by mouth daily.     clonazePAM 0.5 MG tablet  Commonly known as:  KLONOPIN  Take 0.5 mg by mouth  2 (two) times daily.     diphenhydrAMINE 25 mg capsule  Commonly known as:  BENADRYL  Take 25 mg by mouth 2 (two) times daily. Prior to each administration of vancomycin     docusate sodium 100 MG capsule  Commonly known as:  COLACE  Take 100 mg by mouth 2 (two) times daily.     esomeprazole 40 MG capsule  Commonly known as:  NEXIUM  Take 40 mg by mouth daily before breakfast.     exemestane 25 MG tablet  Commonly known as:  AROMASIN  Take 25 mg by mouth daily.     fluticasone 50 MCG/ACT nasal spray  Commonly known as:  FLONASE  Place 1 spray into the nose daily.     folic acid 1 MG tablet  Commonly known as:  FOLVITE  Take 1 mg by mouth daily.     metoprolol succinate 25 MG 24 hr tablet  Commonly known as:  TOPROL-XL  Take 50 mg by mouth Daily. To take with the 200 mg tablet for a total of 250 mg     metoprolol 200 MG 24 hr tablet  Commonly known as:  TOPROL-XL  Take 200 mg by mouth daily.     montelukast 10 MG tablet  Commonly known as:  SINGULAIR  Take 10 mg by mouth at bedtime.     multivitamins ther. w/minerals Tabs tablet  Take 1 tablet by mouth daily.     oxyCODONE 5 MG immediate release tablet  Commonly known as:  Oxy IR/ROXICODONE  Take 5 mg by mouth every 4 (four) hours as needed for pain. 1 -2 pills po q4 hours prn     polyethylene glycol packet  Commonly known as:  MIRALAX / GLYCOLAX  Take 17 g by mouth daily as needed.     rifampin 300 MG capsule  Commonly known as:  RIFADIN  Take 600 mg by mouth daily.     senna 8.6 MG tablet  Commonly known as:  SENOKOT  Take 1 tablet by mouth 2 (two) times daily.     sodium chloride 0.9 % SOLN 250 mL with vancomycin 1000 MG SOLR 1,000 mg  Inject 1,000 mg into the vein daily at 12 noon.     vitamin B-12 100 MCG tablet  Commonly known as:  CYANOCOBALAMIN  Take 50 mcg by mouth daily.     vitamin E 400 UNIT capsule  Take 400 Units by mouth daily.     warfarin 1 MG tablet  Commonly known as:  COUMADIN   Take 1 mg by mouth as directed.        Meds ordered this encounter  Medications  . metoprolol (TOPROL-XL) 200 MG 24 hr tablet    Sig: Take 200 mg by mouth daily.  Marland Kitchen DISCONTD: diphenhydrAMINE (BENADRYL) 50 MG capsule    Sig: Take 50 mg by mouth every 6 (six) hours as needed for itching.  . diphenhydrAMINE (BENADRYL) 25 mg capsule    Sig: Take 25 mg by mouth 2 (two) times daily. Prior to each administration of vancomycin  . oxyCODONE (OXY IR/ROXICODONE) 5 MG immediate release tablet    Sig: Take 5 mg by mouth every 4 (four) hours as needed for pain. 1 -2 pills po q4 hours prn  . rifampin (RIFADIN) 300 MG capsule    Sig: Take 600 mg by mouth daily.  . sodium chloride 0.9 % SOLN 250 mL with vancomycin 1000 MG SOLR 1,000 mg    Sig: Inject 1,000 mg into the vein daily at 12 noon.  . warfarin (COUMADIN) 1 MG tablet    Sig: Take 1 mg by mouth as directed.  . bumetanide (BUMEX) 1 MG tablet    Sig: Take 1 mg by mouth 2 (two) times daily.  . vitamin B-12 (CYANOCOBALAMIN) 100 MCG tablet    Sig: Take 50 mcg by mouth daily.  Marland Kitchen docusate sodium (COLACE) 100 MG capsule    Sig: Take 100 mg by mouth 2 (two) times daily.  . fluticasone (FLONASE) 50 MCG/ACT nasal spray    Sig: Place 1 spray into the nose daily.  . polyethylene glycol (MIRALAX / GLYCOLAX) packet    Sig: Take 17 g by mouth daily as needed.  . senna (SENOKOT) 8.6 MG tablet    Sig: Take 1 tablet by mouth 2 (two) times daily.     There is no immunization history on file for this patient.  History  Substance Use Topics  . Smoking status: Never Smoker   . Smokeless tobacco: Not on file  . Alcohol Use: No    Family history is noncontributory    Review of Systems  DATA OBTAINED: from patient,  GENERAL: Feels well no fevers, fatigue, appetite changes SKIN: forearms are itchy and red- she did not get am benadryl  yest EYES: No eye pain, redness, discharge EARS: No earache, tinnitus, change in hearing NOSE: No congestion,  drainage or bleeding  MOUTH/THROAT: No mouth or tooth pain, No sore throat, No difficulty chewing or swallowing  RESPIRATORY: No cough, wheezing, SOB CARDIAC: No chest pain, palpitations, lower extremity edema  GI: No abdominal pain, No N/V/D or constipation, No heartburn or reflux  GU: No dysuria, frequency or urgency, or incontinence  MUSCULOSKELETAL: No unrelieved bone/joint pain NEUROLOGIC: Awake, alert, appropriate to situation, No change in mental status. Moves all four, no focal deficits PSYCHIATRIC: No overt anxiety or sadness. Sleeps well. No behavior issue    There were no vitals filed for this visit.  Physical Exam  GENERAL APPEARANCE: Alert, conversant. Appropriately groomed. No acute distress.  SKIN: B forarms with large confluent urticaria; no angioedema HEAD: Normocephalic, atraumatic  EYES: Conjunctiva/lids clear. Pupils round, reactive. EOMs intact.  EARS: External exam WNL, canals clear. Hearing grossly normal.  NOSE: No deformity or discharge.  MOUTH/THROAT: Lips w/o lesions.  RESPIRATORY: Breathing is even, unlabored. Lung sounds are clear   CARDIOVASCULAR: Heart RRR no murmurs, rubs or gallops. No peripheral edema; DOUBLE LUMEN R CHEST; THE DRAWING PORT WILL NOT DRAW OR FLUSH; THE INFUSION SIDE  WILL FLASH BACK AND FLUSH   VENOUS: No varicosities. No venous stasis skin changes  GASTROINTESTINAL: Abdomen is soft, non-tender, not distended w/ normal bowel sounds. GENITOURINARY: Bladder non tender, not distended  MUSCULOSKELETAL: No abnormal joints or musculature NEUROLOGIC: Oriented X3. Cranial nerves 2-12 grossly intact. Moves all extremities no tremor. PSYCHIATRIC: Mood and affect appropriate to situation, no behavioral issues  Patient Active Problem List   Diagnosis Date Noted  . Hx MRSA infection 09/08/2012  . S/P total knee arthroplasty 09/08/2012  . Sepsis 08/18/2011  . Breast cancer metastasized to bone 08/18/2011  . Chronic pain 08/18/2011  .  Dehydration 08/18/2011  . CHF (congestive heart failure) 08/18/2011  . Thrombocytopenia 08/18/2011  . Macrocytic anemia 08/18/2011  . CKD (chronic kidney disease) 08/18/2011  . DM type 2 (diabetes mellitus, type 2) 08/18/2011  . OA (osteoarthritis) 08/18/2011  . Fall 08/18/2011  . Essential hypertension 08/18/2011  . Septic joint of left knee joint 08/18/2011  . H/O endocarditis 08/18/2011  . Left leg weakness 06/11/2011  . Difficulty in walking 06/11/2011  . Pain in joint, shoulder region 05/29/2011  . Rotator cuff tear arthropathy of right shoulder 05/29/2011  . Muscle weakness (generalized) 05/29/2011      CBC 08/04/2012- wbc 6.9  10.1/29.8  plt 197  BMP  08/04/2012  137, 4.2, 105, 25, 14/1.3   ALT 12; AST-19  Assessment and Plan  S/P total knee arthroplasty Pt had revision of L total knee arthroscopy on 8/28. Cultures taken intra-operatively were negative but per ID consult because of the presence of pus intraoperatively and because pt has h/o MRSA it was decided on long term course of IV antibiotics. Therefore pt will be on vancomycin 1 gm daily until 10/14/2012. Pt will also be on rifampin 600 mg daily until 11/08/2012. Pt gets hives with vanco so she is getting benadryl 3 times a day and 25 mg of elixer 20 minutes before tx. She will need weekly  CBC, CMP, ESR, CRP and vancomycin trough level with results sent to ID, [phone] phone and fax [phone].   DVT prophylaxis- knee high TED hose BLE 24 hours a day except for bathing/changing may be off for 30-60 minutes. Pt is on warfarin 1 mg daily  With INR goal 1.8-2.2-taking until  10/14/2012. No green , leafy veg while on coumadin.  DM type 2 (diabetes mellitus, type 2) Pt says she is not on any medications for DM2-its all diet and exercise;says BS never goes over 150;says she did not to her knowledge receive any sliding scale insulin during hospital stay; plan check CBG in am daily  Essential hypertension Stable on current  medications for it and CHF- Bumex and metoprolol  CHF (congestive heart failure) Daily weights; consult for > 3 pounds in 24 hours or >5 pounds in a week TTE 08/25/2012 - normal LV soize and wall thickness, systolic wall function normal with EF - 55%; R atriaum normal size ;large mobile vegetation seen on ventricular side of pulmonic valve;mod to severe pulmonic valvular regurg-COMPARED TO PRIOR STUDY LV FUNCTION IMPROVED AND PR SLIGHTLY BETTER  Breast cancer metastasized to bone Pt has stage 4 breast CA - she will have several follow-up appt with oncology at [facility] while she is here  DOUBLE LUMEN MALFUNCTION- Pt says that the last few days at [facility] that one port would not draw well or flush well;now it won't flush or draw at all;pt needs to be evaluated and decision made if she needs a new double lumen or PICC line for her 6 week course of vancomycin;staff is aware  Margit Hanks, MD

## 2012-09-09 ENCOUNTER — Encounter: Payer: Self-pay | Admitting: Internal Medicine

## 2012-09-09 ENCOUNTER — Other Ambulatory Visit: Payer: Self-pay | Admitting: Internal Medicine

## 2012-09-09 ENCOUNTER — Other Ambulatory Visit: Payer: Self-pay | Admitting: *Deleted

## 2012-09-09 DIAGNOSIS — A4902 Methicillin resistant Staphylococcus aureus infection, unspecified site: Secondary | ICD-10-CM

## 2012-09-09 MED ORDER — CLONAZEPAM 0.5 MG PO TABS: ORAL_TABLET | ORAL | Status: DC

## 2012-09-09 NOTE — Assessment & Plan Note (Addendum)
Daily weights; consult for > 3 pounds in 24 hours or >5 pounds in a week TTE 08/25/2012 - normal LV soize and wall thickness, systolic wall function normal with EF - 55%; R atriaum normal size ;large mobile vegetation seen on ventricular side of pulmonic valve;mod to severe pulmonic valvular regurg-COMPARED TO PRIOR STUDY LV FUNCTION IMPROVED AND PR SLIGHTLY BETTER

## 2012-09-09 NOTE — Assessment & Plan Note (Addendum)
Pt had revision of L total knee arthroscopy on 8/28. Cultures taken intra-operatively were negative but per ID consult because of the presence of pus intraoperatively and because pt has h/o MRSA it was decided on long term course of IV antibiotics. Therefore pt will be on vancomycin 1 gm daily until 10/14/2012. Pt will also be on rifampin 600 mg daily until 11/08/2012. Pt gets hives with vanco so she is getting benadryl 3 times a day and 25 mg of elixer 20 minutes before tx. She will need weekly  CBC, CMP, ESR, CRP and vancomycin trough level with results sent to ID, [phone] phone and fax [phone].   DVT prophylaxis- knee high TED hose BLE 24 hours a day except for bathing/changing may be off for 30-60 minutes. Pt is on warfarin 1 mg daily  With INR goal 1.8-2.2-taking until 10/14/2012. No green , leafy veg while on coumadin.

## 2012-09-09 NOTE — Assessment & Plan Note (Addendum)
Stable on current medications for it and CHF- Bumex and metoprolol

## 2012-09-09 NOTE — Assessment & Plan Note (Signed)
Pt has stage 4 breast CA - she will have several follow-up appt with oncology at [facility] while she is here

## 2012-09-09 NOTE — Assessment & Plan Note (Signed)
Pt says she is not on any medications for DM2-its all diet and exercise;says BS never goes over 150;says she did not to her knowledge receive any sliding scale insulin during hospital stay; plan check CBG in am daily

## 2012-09-10 ENCOUNTER — Other Ambulatory Visit: Payer: Self-pay | Admitting: Internal Medicine

## 2012-09-10 ENCOUNTER — Ambulatory Visit (HOSPITAL_COMMUNITY)
Admission: RE | Admit: 2012-09-10 | Discharge: 2012-09-10 | Disposition: A | Discharge status: Home / Self Care | Payer: PRIVATE HEALTH INSURANCE | Source: Ambulatory Visit | Attending: Internal Medicine | Admitting: Internal Medicine

## 2012-09-10 DIAGNOSIS — L089 Local infection of the skin and subcutaneous tissue, unspecified: Secondary | ICD-10-CM | POA: Insufficient documentation

## 2012-09-10 DIAGNOSIS — Z452 Encounter for adjustment and management of vascular access device: Secondary | ICD-10-CM | POA: Insufficient documentation

## 2012-09-10 DIAGNOSIS — A4902 Methicillin resistant Staphylococcus aureus infection, unspecified site: Secondary | ICD-10-CM

## 2012-09-10 MED ORDER — CHLORHEXIDINE GLUCONATE 4 % EX LIQD
CUTANEOUS | Status: AC
  Filled 2012-09-10: qty 30

## 2012-09-10 NOTE — Procedures (Signed)
Successful exchange of the tunneled central venous catheter.  Catheter tip at SVC/RA junction.

## 2012-09-15 ENCOUNTER — Encounter: Payer: Self-pay | Admitting: Internal Medicine

## 2012-09-15 ENCOUNTER — Non-Acute Institutional Stay (SKILLED_NURSING_FACILITY): Payer: PRIVATE HEALTH INSURANCE | Admitting: Internal Medicine

## 2012-09-15 DIAGNOSIS — Z96652 Presence of left artificial knee joint: Secondary | ICD-10-CM

## 2012-09-15 DIAGNOSIS — Z96659 Presence of unspecified artificial knee joint: Secondary | ICD-10-CM

## 2012-09-15 DIAGNOSIS — Z79899 Other long term (current) drug therapy: Secondary | ICD-10-CM

## 2012-09-15 NOTE — Progress Notes (Signed)
MRN: [id number] Name: Catherine Mccullough  Sex: female Age: 67 y.o. DOB: 03/08/1945  PSC #: Sonny Dandy Facility/Room: 116 Level Of Care: SNF Provider: Merrilee Seashore D Emergency Contacts: Extended Emergency Contact Information Primary Emergency Contact: Laqueta [place] Home Phone: [phone] Relation: Son Secondary Emergency Contact: Karena Addison Address: [address]          [place], [place] [address] [place] Home Phone: [phone] Mobile Phone: [phone] Relation: Father  Code Status: FULL  Allergies: Mometasone; Aspirin; Contrast media; Cortisone; Insulins; Lasix; Lyrica; Other; Prednisone; Sulfamethoxazole; Ultram; Codeine; Dilantin; Latex; and Zosyn  Chief Complaint  Patient presents with  . Acute Visit    HPI: Patient is 67 y.o. female who thinks she is not on all the vitamins she  was taking before.  Past Medical History  Diagnosis Date  . Kidney disease   . Hypertension   . Diabetes mellitus   . 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 Arthritis   . CHF (congestive heart failure)   . Lymphedema of leg   . Anxiety     Past Surgical History  Procedure Laterality Date  . Breast surgery    . Cholecystectomy    . Abdominal hysterectomy    . Replacement total knee bilateral    . Hand tendon surgery        Medication List       This list is accurate as of: 09/15/12  5:59 PM.  Always use your most recent med list.               acetaminophen 500 MG tablet  Commonly known as:  TYLENOL  Take 1,000 mg by mouth every 6 (six) hours as needed. Pain     bumetanide 1 MG tablet  Commonly known as:  BUMEX  Take 1 mg by mouth 2 (two) times daily.     calcium-vitamin D 500-200 MG-UNIT per tablet  Commonly known as:  OSCAL WITH D  Take 1 tablet by mouth daily.     clonazePAM 0.5 MG tablet  Commonly known as:  KLONOPIN  Take one tablet by mouth twice daily for 30 days     diphenhydrAMINE 25 mg capsule  Commonly known  as:  BENADRYL  Take 25 mg by mouth 2 (two) times daily. Prior to each administration of vancomycin     docusate sodium 100 MG capsule  Commonly known as:  COLACE  Take 100 mg by mouth 2 (two) times daily.     esomeprazole 40 MG capsule  Commonly known as:  NEXIUM  Take 40 mg by mouth daily before breakfast.     exemestane 25 MG tablet  Commonly known as:  AROMASIN  Take 25 mg by mouth daily.     fluticasone 50 MCG/ACT nasal spray  Commonly known as:  FLONASE  Place 1 spray into the nose daily.     folic acid 1 MG tablet  Commonly known as:  FOLVITE  Take 1 mg by mouth daily.     metoprolol succinate 25 MG 24 hr tablet  Commonly known as:  TOPROL-XL  Take 50 mg by mouth Daily. To take with the 200 mg tablet for a total of 250 mg     metoprolol 200 MG 24 hr tablet  Commonly known as:  TOPROL-XL  Take 200 mg by mouth daily.     montelukast 10 MG tablet  Commonly known as:  SINGULAIR  Take 10 mg by mouth at  bedtime.     multivitamins ther. w/minerals Tabs tablet  Take 1 tablet by mouth daily.     oxyCODONE 5 MG immediate release tablet  Commonly known as:  Oxy IR/ROXICODONE  Take 5 mg by mouth every 4 (four) hours as needed for pain. 1 -2 pills po q4 hours prn     polyethylene glycol packet  Commonly known as:  MIRALAX / GLYCOLAX  Take 17 g by mouth daily as needed.     rifampin 300 MG capsule  Commonly known as:  RIFADIN  Take 600 mg by mouth daily.     senna 8.6 MG tablet  Commonly known as:  SENOKOT  Take 1 tablet by mouth 2 (two) times daily.     sodium chloride 0.9 % SOLN 250 mL with vancomycin 1000 MG SOLR 1,000 mg  Inject 1,000 mg into the vein daily at 12 noon.     vitamin B-12 100 MCG tablet  Commonly known as:  CYANOCOBALAMIN  Take 50 mcg by mouth daily.     vitamin E 400 UNIT capsule  Take 400 Units by mouth daily.     warfarin 1 MG tablet  Commonly known as:  COUMADIN  Take 1 mg by mouth as directed.        No orders of the defined types  were placed in this encounter.     There is no immunization history on file for this patient.    Filed Vitals:   09/15/12 1749  BP: 121/70  Pulse: 60  Temp: 98.1 F (36.7 C)  Resp: 18    Physical Exam  GENERAL APPEARANCE: Alert, conversant. Appropriately groomed. No acute distress.  SKIN: No diaphoresis rash, or wounds HEENT- unremarkable RESPIRATORY: Breathing is even, unlabored. Lung sounds are clear   CARDIOVASCULAR: Heart RRR no murmurs, rubs or gallops. No peripheral edema.  GASTROINTESTINAL: Abdomen is soft, non-tender, not distended w/ normal bowel sounds NEUROLOGIC: Oriented X3. Cranial nerves 2-12 grossly intact. Moves all extremities no tremor. PSYCHIATRIC: Mood and affect appropriate to situation, no behavioral issues  Patient Active Problem List   Diagnosis Date Noted  . Hx MRSA infection 09/08/2012  . S/P total knee arthroplasty 09/08/2012  . Sepsis 08/18/2011  . Breast cancer metastasized to bone 08/18/2011  . Chronic pain 08/18/2011  . Dehydration 08/18/2011  . CHF (congestive heart failure) 08/18/2011  . Thrombocytopenia 08/18/2011  . Macrocytic anemia 08/18/2011  . CKD (chronic kidney disease) 08/18/2011  . DM type 2 (diabetes mellitus, type 2) 08/18/2011  . OA (osteoarthritis) 08/18/2011  . Fall 08/18/2011  . Essential hypertension 08/18/2011  . Septic joint of left knee joint 08/18/2011  . H/O endocarditis 08/18/2011  . Left leg weakness 06/11/2011  . Difficulty in walking 06/11/2011  . Pain in joint, shoulder region 05/29/2011  . Rotator cuff tear arthropathy of right shoulder 05/29/2011  . Muscle weakness (generalized) 05/29/2011    Functional assessment:   CBC    Component Value Date/Time   WBC 4.4 08/18/2011 0439   RBC 3.04* 08/18/2011 0439   HGB 10.7* 08/18/2011 0439   HCT 32.0* 08/18/2011 0439   PLT 118* 08/18/2011 0439   MCV 105.3* 08/18/2011 0439   LYMPHSABS 1.3 12/04/2010 1707   MONOABS 0.4 12/04/2010 1707   EOSABS 0.2 12/04/2010  1707   BASOSABS 0.0 12/04/2010 1707    CMP     Component Value Date/Time   NA 134* 08/18/2011 0439   K 4.9 08/18/2011 0439   CL 99 08/18/2011 0439   CO2 22  08/18/2011 0439   GLUCOSE 175* 08/18/2011 0439   BUN 39* 08/18/2011 0439   CREATININE 1.82* 08/18/2011 0439   CALCIUM 9.6 08/18/2011 0439   PROT 5.7* 08/18/2011 0800   ALBUMIN 2.3* 08/18/2011 0800   AST 27 08/18/2011 0800   ALT 18 08/18/2011 0800   ALKPHOS 73 08/18/2011 0800   BILITOT 0.3 08/18/2011 0800   GFRNONAA 28* 08/18/2011 0439   GFRAA 33* 08/18/2011 0439    Assessment and Plan MEDICATIONS- it was identified that pt had been on Vit C prior and that was not on her med list for this admission and pt had also been on magnesium. Both of these were ordered for pt.  S/P total knee arthroplasty Pt said her doctor  Wanted her to have ice on her knee 5-6 times a day-that has been ordered    Margit Hanks, MD

## 2012-09-15 NOTE — Assessment & Plan Note (Signed)
Pt said her doctor  Wanted her to have ice on her knee 5-6 times a day-that has been ordered

## 2012-09-25 ENCOUNTER — Emergency Department (HOSPITAL_COMMUNITY)
Admission: EM | Admit: 2012-09-25 | Discharge: 2012-09-25 | Disposition: A | Discharge status: Home / Self Care | Payer: PRIVATE HEALTH INSURANCE | Attending: Emergency Medicine | Admitting: Emergency Medicine

## 2012-09-25 ENCOUNTER — Encounter (HOSPITAL_COMMUNITY): Payer: Self-pay | Admitting: Emergency Medicine

## 2012-09-25 DIAGNOSIS — I509 Heart failure, unspecified: Secondary | ICD-10-CM | POA: Insufficient documentation

## 2012-09-25 DIAGNOSIS — Z79899 Other long term (current) drug therapy: Secondary | ICD-10-CM | POA: Insufficient documentation

## 2012-09-25 DIAGNOSIS — Z853 Personal history of malignant neoplasm of breast: Secondary | ICD-10-CM | POA: Insufficient documentation

## 2012-09-25 DIAGNOSIS — E119 Type 2 diabetes mellitus without complications: Secondary | ICD-10-CM | POA: Insufficient documentation

## 2012-09-25 DIAGNOSIS — M129 Arthropathy, unspecified: Secondary | ICD-10-CM | POA: Insufficient documentation

## 2012-09-25 DIAGNOSIS — IMO0002 Reserved for concepts with insufficient information to code with codable children: Secondary | ICD-10-CM | POA: Insufficient documentation

## 2012-09-25 DIAGNOSIS — Z7901 Long term (current) use of anticoagulants: Secondary | ICD-10-CM | POA: Insufficient documentation

## 2012-09-25 DIAGNOSIS — Z96659 Presence of unspecified artificial knee joint: Secondary | ICD-10-CM | POA: Insufficient documentation

## 2012-09-25 DIAGNOSIS — T50905A Adverse effect of unspecified drugs, medicaments and biological substances, initial encounter: Secondary | ICD-10-CM

## 2012-09-25 DIAGNOSIS — L299 Pruritus, unspecified: Secondary | ICD-10-CM | POA: Insufficient documentation

## 2012-09-25 DIAGNOSIS — Z91040 Latex allergy status: Secondary | ICD-10-CM | POA: Insufficient documentation

## 2012-09-25 DIAGNOSIS — Z85830 Personal history of malignant neoplasm of bone: Secondary | ICD-10-CM | POA: Insufficient documentation

## 2012-09-25 DIAGNOSIS — T365X5A Adverse effect of aminoglycosides, initial encounter: Secondary | ICD-10-CM | POA: Insufficient documentation

## 2012-09-25 DIAGNOSIS — N289 Disorder of kidney and ureter, unspecified: Secondary | ICD-10-CM | POA: Insufficient documentation

## 2012-09-25 DIAGNOSIS — I10 Essential (primary) hypertension: Secondary | ICD-10-CM | POA: Insufficient documentation

## 2012-09-25 DIAGNOSIS — F411 Generalized anxiety disorder: Secondary | ICD-10-CM | POA: Insufficient documentation

## 2012-09-25 DIAGNOSIS — M25569 Pain in unspecified knee: Secondary | ICD-10-CM | POA: Insufficient documentation

## 2012-09-25 LAB — BASIC METABOLIC PANEL
CO2: 22 mEq/L (ref 19–32)
Chloride: 101 mEq/L (ref 96–112)
Creatinine, Ser: 1.24 mg/dL — ABNORMAL HIGH (ref 0.50–1.10)
Potassium: 4.1 mEq/L (ref 3.5–5.1)

## 2012-09-25 LAB — VANCOMYCIN, RANDOM: Vancomycin Rm: 11.1 ug/mL

## 2012-09-25 MED ORDER — HYDROXYZINE HCL 25 MG PO TABS
25.0000 mg | ORAL_TABLET | Freq: Once | ORAL | Status: AC
  Administered 2012-09-25: 25 mg via ORAL
  Filled 2012-09-25: qty 1

## 2012-09-25 MED ORDER — CARRINGTON MOISTURE BARRIER EX CREA: TOPICAL_CREAM | CUTANEOUS | Status: DC | PRN

## 2012-09-25 MED ORDER — SODIUM CHLORIDE 0.9 % IV BOLUS (SEPSIS)
250.0000 mL | Freq: Once | INTRAVENOUS | Status: AC
  Administered 2012-09-25: 250 mL via INTRAVENOUS

## 2012-09-25 MED ORDER — DIPHENHYDRAMINE HCL 50 MG/ML IJ SOLN: INTRAMUSCULAR | Status: DC

## 2012-09-25 MED ORDER — DIPHENHYDRAMINE HCL 50 MG/ML IJ SOLN
25.0000 mg | Freq: Once | INTRAMUSCULAR | Status: AC
  Administered 2012-09-25: 25 mg via INTRAVENOUS
  Filled 2012-09-25: qty 1

## 2012-09-25 NOTE — ED Notes (Addendum)
Patient arguing with Dr. Fayrene Fearing that she needs a medication to get the Vancomycin out of her body. He went over treatment plan with her and she continues to argue that we need to remove the Vancomycin from her body. Dr. Fayrene Fearing very politely restated treatment plan for medication to help with allergic reaction symptoms and a vancomycin blood test to help her to know there is no Vancomycin in her system. Patient calm and quiet at this time.

## 2012-09-25 NOTE — ED Notes (Signed)
From [facility]. Patient states she is having an allergic reaction to Vancomycin that she states was given to her yesterday at noon. [facility] staff sends a note that she has not been given Vancomycin since 09/16/12; since 09/16/12 she has been on Daptomycin q24h with no issues. She was given Atarax 25mg  and stated she had no relief from itching. She states she is itching all over her body. She states she knows they gave her the wrong medication yesterday and she knows there is Vancomycin in her body. Note from [facility] states she demanded to come to ER. No symptoms on visual exam, lungs clear on auscultation.

## 2012-09-25 NOTE — ED Notes (Signed)
Notified [facility] patient is ready for transport back to [place].

## 2012-09-25 NOTE — ED Notes (Signed)
PICC line intact to right subclavian area. Good blood return and flush both ports.

## 2012-09-26 NOTE — ED Provider Notes (Signed)
CSN: [id number]     Arrival date & time 09/25/12  4696 History   First MD Initiated Contact with Patient 09/25/12 [phone]     Chief Complaint  Patient presents with  . Allergic Reaction    HPI  Patient has a history of an infected total knee arthroplasty. Had her hardware removed. He is getting IV antibiotics at a rehabilitation facility. Was getting IV vancomycin. She became somewhat intolerant of because of itching. Last dose was almost 48 hours ago. She has  gotten 2, once daily doses of daptomycin IV for the last 2 days. She continued with complaints of itching and requested transfer the emergency room for evaluation.  She has no shortness of breath. She has no sensation of swelling in her throat. She is eating and drinking and swallowing without difficulty. She does not have visible rash. She has had no fever. She initially had stated that she felt that given her the wrong medication yesterday. However as we talk she is aware that her vancomycin was discontinued 2 days ago. Her main complaint is "I think there is still some vancomycin me and I need you to give the medicine to take it out". We discussed the fact that vancomycin is renally clear to we will simply have to insure that she is euvolemic and hydrated and has adequate renal function.   Past Medical History  Diagnosis Date  . Kidney disease   . Hypertension   . Diabetes mellitus   . 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 Arthritis   . CHF (congestive heart failure)   . Lymphedema of leg   . Anxiety    Past Surgical History  Procedure Laterality Date  . Breast surgery    . Cholecystectomy    . Abdominal hysterectomy    . Replacement total knee bilateral    . Hand tendon surgery     History reviewed. No pertinent family history. History  Substance Use Topics  . Smoking status: Never Smoker   . Smokeless tobacco: Never Used  . Alcohol Use: No   OB History   Grav Para Term Preterm Abortions TAB SAB Ect Mult Living                  Review of Systems  Constitutional: Negative for fever, chills, diaphoresis, appetite change and fatigue.  HENT: Negative for sore throat, mouth sores and trouble swallowing.   Eyes: Negative for visual disturbance.  Respiratory: Negative for cough, chest tightness, shortness of breath and wheezing.   Cardiovascular: Negative for chest pain.  Gastrointestinal: Negative for nausea, vomiting, abdominal pain, diarrhea and abdominal distention.  Endocrine: Negative for polydipsia, polyphagia and polyuria.  Genitourinary: Negative for dysuria, frequency and hematuria.  Musculoskeletal: Positive for arthralgias. Negative for gait problem.       A sudden level of pain in her left knee. She is nonweightbearing on this knee.  Skin: Negative for color change, pallor and rash.       Pruritus. No urticaria  Neurological: Negative for dizziness, syncope, light-headedness and headaches.  Hematological: Does not bruise/bleed easily.  Psychiatric/Behavioral: Negative for behavioral problems and confusion.    Allergies  Mometasone; Aspirin; Contrast media; Cortisone; Insulins; Lasix; Lyrica; Other; Prednisone; Sulfamethoxazole; Ultram; Vancomycin; Codeine; Dilantin; Latex; and Zosyn  Home Medications   Current Outpatient Rx  Name  Route  Sig  Dispense  Refill  . acetaminophen (TYLENOL) 500 MG tablet   Oral   Take 1,000 mg by mouth every 6 (  six) hours as needed for pain.          . calcium carbonate (OS-CAL - DOSED IN MG OF ELEMENTAL CALCIUM) 1250 MG tablet   Oral   Take 1 tablet by mouth daily with breakfast.         . calcium-vitamin D (OSCAL WITH D) 500-200 MG-UNIT per tablet   Oral   Take 1 tablet by mouth daily.         . cholecalciferol (VITAMIN D) 400 UNITS TABS tablet   Oral   Take 400 Units by mouth.         . clonazePAM (KLONOPIN) 0.5 MG tablet   Oral   Take 0.5 mg by mouth 2 (two) times daily as needed for anxiety.         . diphenhydrAMINE (BENADRYL) 25 mg  capsule   Oral   Take 25 mg by mouth 2 (two) times daily. Prior to each administration of vancomycin         . docusate sodium (COLACE) 100 MG capsule   Oral   Take 100 mg by mouth 2 (two) times daily.         Marland Kitchen esomeprazole (NEXIUM) 40 MG capsule   Oral   Take 40 mg by mouth daily before breakfast.           . esomeprazole (NEXIUM) 40 MG capsule   Oral   Take 40 mg by mouth daily before breakfast.         . exemestane (AROMASIN) 25 MG tablet   Oral   Take 25 mg by mouth daily.           . fluticasone (FLONASE) 50 MCG/ACT nasal spray   Nasal   Place 1 spray into the nose daily.         . folic acid (FOLVITE) 1 MG tablet   Oral   Take 1 mg by mouth daily.         . hydrOXYzine (ATARAX/VISTARIL) 10 MG tablet   Oral   Take 10 mg by mouth every 8 (eight) hours as needed for itching or anxiety.         Marland Kitchen loratadine (CLARITIN) 10 MG tablet   Oral   Take 10 mg by mouth daily.         . magnesium oxide (MAG-OX) 400 MG tablet   Oral   Take 400 mg by mouth daily.         . metoprolol (TOPROL-XL) 200 MG 24 hr tablet   Oral   Take 200 mg by mouth daily. Takes with a 25 mg tablet to total 225 mg daily         . metoprolol succinate (TOPROL-XL) 25 MG 24 hr tablet   Oral   Take 50 mg by mouth Daily. To take with the 200 mg tablet for a total of 250 mg         . montelukast (SINGULAIR) 10 MG tablet   Oral   Take 10 mg by mouth at bedtime.           . Multiple Vitamins-Minerals (MULTIVITAMINS THER. W/MINERALS) TABS   Oral   Take 1 tablet by mouth daily.           Marland Kitchen oxyCODONE (OXY IR/ROXICODONE) 5 MG immediate release tablet   Oral   Take 5-10 mg by mouth every 4 (four) hours as needed for pain.          Marland Kitchen oxyCODONE-acetaminophen (PERCOCET) 7.5-325 MG per tablet  Oral   Take 1 tablet by mouth 3 (three) times daily as needed for pain.          . polyethylene glycol (MIRALAX / GLYCOLAX) packet   Oral   Take 17 g by mouth daily as needed  (constipation).          . rifampin (RIFADIN) 300 MG capsule   Oral   Take 600 mg by mouth daily.         Marland Kitchen senna (SENOKOT) 8.6 MG tablet   Oral   Take 1 tablet by mouth 2 (two) times daily.         . sodium chloride 0.9 % SOLN 100 mL with DAPTOmycin 500 MG SOLR   Intravenous   Inject 300 mg into the vein daily.         . sodium chloride 0.9 % SOLN 250 mL with vancomycin 1000 MG SOLR 1,000 mg   Intravenous   Inject 1,000 mg into the vein daily at 12 noon.         . vitamin B-12 (CYANOCOBALAMIN) 100 MCG tablet   Oral   Take 50 mcg by mouth daily.         . vitamin B-12 (CYANOCOBALAMIN) 100 MCG tablet   Oral   Take 100 mcg by mouth daily.         . vitamin C (ASCORBIC ACID) 500 MG tablet   Oral   Take 500 mg by mouth daily.         . vitamin E 400 UNIT capsule   Oral   Take 400 Units by mouth daily.         Marland Kitchen warfarin (COUMADIN) 2 MG tablet   Oral   Take 2 mg by mouth every evening.         . diphenhydrAMINE (BENADRYL) 50 MG/ML injection      25mg  IV before each dose IV antibiotics.   1 mL   0   . Skin Protectants, Misc. (EUCERIN) cream   Topical   Apply topically as needed for wound care.   397 g   0    BP 112/47  Pulse 75  Temp(Src) 98.1 F (36.7 C) (Oral)  Resp 16  Ht 4\' 6"  (1.372 m)  Wt 106 lb (48.081 kg)  BMI 25.54 kg/m2  SpO2 100% Physical Exam  Constitutional: She is oriented to person, place, and time. She appears well-developed and well-nourished. No distress.  HENT:  Head: Normocephalic.  Eyes: Conjunctivae are normal. Pupils are equal, round, and reactive to light. No scleral icterus.  Neck: Normal range of motion. Neck supple. No thyromegaly present.  Cardiovascular: Normal rate and regular rhythm.  Exam reveals no gallop and no friction rub.   No murmur heard. Pulmonary/Chest: Effort normal and breath sounds normal. No respiratory distress. She has no wheezes. She has no rales.  Abdominal: Soft. Bowel sounds are  normal. She exhibits no distension. There is no tenderness. There is no rebound.  Musculoskeletal: Normal range of motion.  The incision overlying her left knee is not fluctuant no joint swelling or effusion. No sign of DVT clinically in the leg  Neurological: She is alert and oriented to person, place, and time.  Skin: Skin is dry. No rash noted.  Multiple areas of excoriated skin from scratching. No urticaria. No petechiae.  Psychiatric: She has a normal mood and affect. Her behavior is normal.    ED Course  Procedures (including critical care time) Labs Review Labs Reviewed  BASIC  METABOLIC PANEL - Abnormal; Notable for the following:    Sodium 132 (*)    Creatinine, Ser 1.24 (*)    GFR calc non Af Amer 45 (*)    GFR calc Af Amer 52 (*)    All other components within normal limits  VANCOMYCIN, RANDOM   Imaging Review No results found.  MDM   1. Medication reaction, initial encounter    She still has a vancomycin serum level. We discussed that this would slowly improve and resolve as her renal function is adequate. She is euvolemic. She was hydrated with 250 cc of IV fluid here. Use of use or cream for dry skin Atarax and/or Benadryl for her itching    Roney Marion, MD 09/26/12 [phone]

## 2012-09-29 ENCOUNTER — Other Ambulatory Visit: Payer: Self-pay | Admitting: Internal Medicine

## 2012-10-08 ENCOUNTER — Other Ambulatory Visit: Payer: Self-pay | Admitting: *Deleted

## 2012-10-08 MED ORDER — CLONAZEPAM 0.5 MG PO TABS: ORAL_TABLET | ORAL | Status: DC

## 2012-10-18 ENCOUNTER — Non-Acute Institutional Stay (SKILLED_NURSING_FACILITY): Payer: PRIVATE HEALTH INSURANCE | Admitting: Internal Medicine

## 2012-10-18 ENCOUNTER — Encounter: Payer: Self-pay | Admitting: Internal Medicine

## 2012-10-18 DIAGNOSIS — Z96659 Presence of unspecified artificial knee joint: Secondary | ICD-10-CM

## 2012-10-18 DIAGNOSIS — E119 Type 2 diabetes mellitus without complications: Secondary | ICD-10-CM

## 2012-10-18 DIAGNOSIS — I10 Essential (primary) hypertension: Secondary | ICD-10-CM

## 2012-10-18 DIAGNOSIS — C50919 Malignant neoplasm of unspecified site of unspecified female breast: Secondary | ICD-10-CM

## 2012-10-18 DIAGNOSIS — Z96652 Presence of left artificial knee joint: Secondary | ICD-10-CM

## 2012-10-18 DIAGNOSIS — Z8614 Personal history of Methicillin resistant Staphylococcus aureus infection: Secondary | ICD-10-CM

## 2012-10-18 DIAGNOSIS — C7951 Secondary malignant neoplasm of bone: Secondary | ICD-10-CM

## 2012-10-18 NOTE — Assessment & Plan Note (Signed)
Pt will be going home with PICC line and will be getting daptomycin IV daily and rifampin 600 mg daily with benadryl before until 11/08/2012. She had had pus develope in her knee revision

## 2012-10-18 NOTE — Progress Notes (Addendum)
MRN: [id number] Name: Catherine Mccullough  Sex: female Age: 67 y.o. DOB: Sep 08, 1945  PSC #: Sonny Dandy Facility/Room: Level Of Care: SNF Provider: Merrilee Seashore D Emergency Contacts: Extended Emergency Contact Information Primary Emergency Contact: Laqueta [place] Home Phone: [phone] Relation: Son Secondary Emergency Contact: Karena Addison Address: [address]          [place], [place] [address] [place] Home Phone: [phone] Mobile Phone: [phone] Relation: Father  Code Status: FULL  Allergies: Mometasone; Aspirin; Contrast media; Cortisone; Insulins; Lasix; Lyrica; Other; Prednisone; Sulfamethoxazole; Ultram; Vancomycin; Codeine; Dilantin; Latex; and Zosyn  Chief Complaint  Patient presents with  . Discharge Note    HPI: Patient is 67 y.o. female who is being d/c to home.  Past Medical History  Diagnosis Date  . Kidney disease   . Hypertension   . Diabetes mellitus   . 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 Arthritis   . CHF (congestive heart failure)   . Lymphedema of leg   . Anxiety     Past Surgical History  Procedure Laterality Date  . Breast surgery    . Cholecystectomy    . Abdominal hysterectomy    . Replacement total knee bilateral    . Hand tendon surgery        Medication List       This list is accurate as of: 10/18/12 11:50 AM.  Always use your most recent med list.               bumetanide 1 MG tablet  Commonly known as:  BUMEX  Take 1 mg by mouth daily. 1 q 3 days for weight gain >- 3pounds     cholecalciferol 400 UNITS Tabs tablet  Commonly known as:  VITAMIN D  Take 400 Units by mouth.     clonazePAM 0.5 MG tablet  Commonly known as:  KLONOPIN  Take 1 tablet by mouth twice daily x 30 days. (use for Klonopin)     diphenhydrAMINE 25 mg capsule  Commonly known as:  BENADRYL  Take 25 mg by mouth 2 (two) times daily. Prior to each administration of vancomycin     BENADRYL CHILDRENS  ALLERGY 12.5 MG/5ML liquid  Generic drug:  diphenhydrAMINE  Take 25 mg by mouth 4 (four) times daily as needed for allergies. 2 tsp po before breakfast, lunch and bedtime and before IV antibiotic admin     docusate sodium 100 MG capsule  Commonly known as:  COLACE  Take 100 mg by mouth 2 (two) times daily.     esomeprazole 40 MG capsule  Commonly known as:  NEXIUM  Take 40 mg by mouth daily before breakfast.     eucerin cream  Apply topically as needed for wound care.     exemestane 25 MG tablet  Commonly known as:  AROMASIN  Take 25 mg by mouth daily.     fluticasone 50 MCG/ACT nasal spray  Commonly known as:  FLONASE  Place 1 spray into the nose daily.     folic acid 1 MG tablet  Commonly known as:  FOLVITE  Take 1 mg by mouth daily.     loratadine 10 MG tablet  Commonly known as:  CLARITIN  Take 10 mg by mouth daily.     magnesium oxide 400 MG tablet  Commonly known as:  MAG-OX  Take 400 mg by mouth daily.     metoprolol succinate 25 MG 24 hr tablet  Commonly known as:  TOPROL-XL  Take 50 mg by mouth Daily. To take with the 200 mg tablet for a total of 250 mg     metoprolol 200 MG 24 hr tablet  Commonly known as:  TOPROL-XL  Take 200 mg by mouth daily. Takes with a 25 mg tablet to total 225 mg daily     montelukast 10 MG tablet  Commonly known as:  SINGULAIR  Take 10 mg by mouth at bedtime.     multivitamins ther. w/minerals Tabs tablet  Take 1 tablet by mouth daily.     oxyCODONE-acetaminophen 10-325 MG per tablet  Commonly known as:  PERCOCET  Take 1 tablet by mouth 3 (three) times daily as needed for pain. For 10 days     oxyCODONE-acetaminophen 5-325 MG per tablet  Commonly known as:  PERCOCET/ROXICET  Take 1 tablet by mouth every 4 (four) hours as needed for pain. 1-2 tabs po q 4 hr prn     polyethylene glycol packet  Commonly known as:  MIRALAX / GLYCOLAX  Take 17 g by mouth daily as needed (constipation).     pregabalin 25 MG capsule  Commonly  known as:  LYRICA  Take 25 mg by mouth 3 (three) times daily.     rifampin 300 MG capsule  Commonly known as:  RIFADIN  Take 600 mg by mouth daily.     senna 8.6 MG tablet  Commonly known as:  SENOKOT  Take 1 tablet by mouth 2 (two) times daily.     sodium chloride 0.9 % SOLN 100 mL with DAPTOmycin 500 MG SOLR  Inject 300 mg into the vein daily.     vitamin B-12 100 MCG tablet  Commonly known as:  CYANOCOBALAMIN  Take 100 mcg by mouth daily.     vitamin C 500 MG tablet  Commonly known as:  ASCORBIC ACID  Take 500 mg by mouth daily.     vitamin E 400 UNIT capsule  Take 400 Units by mouth daily.     warfarin 2.5 MG tablet  Commonly known as:  COUMADIN  Take 2.5 mg by mouth daily. M, W, F     warfarin 3 MG tablet  Commonly known as:  COUMADIN  Take 3 mg by mouth daily. Tues, Thurs, Sat, Sun        Meds ordered this encounter  Medications  . warfarin (COUMADIN) 2.5 MG tablet    Sig: Take 2.5 mg by mouth daily. M, W, F  . warfarin (COUMADIN) 3 MG tablet    Sig: Take 3 mg by mouth daily. Tues, Thurs, Sat, Sun  . pregabalin (LYRICA) 25 MG capsule    Sig: Take 25 mg by mouth 3 (three) times daily.  Marland Kitchen oxyCODONE-acetaminophen (PERCOCET) 10-325 MG per tablet    Sig: Take 1 tablet by mouth 3 (three) times daily as needed for pain. For 10 days  . oxyCODONE-acetaminophen (PERCOCET/ROXICET) 5-325 MG per tablet    Sig: Take 1 tablet by mouth every 4 (four) hours as needed for pain. 1-2 tabs po q 4 hr prn  . bumetanide (BUMEX) 1 MG tablet    Sig: Take 1 mg by mouth daily. 1 q 3 days for weight gain >- 3pounds  . diphenhydrAMINE (BENADRYL CHILDRENS ALLERGY) 12.5 MG/5ML liquid    Sig: Take 25 mg by mouth 4 (four) times daily as needed for allergies. 2 tsp po before breakfast, lunch and bedtime and before IV antibiotic admin     There is no  immunization history on file for this patient.  History  Substance Use Topics  . Smoking status: Never Smoker   . Smokeless tobacco: Never  Used  . Alcohol Use: No    Filed Vitals:   10/18/12 1135  BP: 125/66  Pulse: 75  Temp: 99 F (37.2 C)  Resp: 18    Physical Exam  GENERAL APPEARANCE: Alert, conversant. Appropriately groomed. No acute distress.  HEENT: Unremarkable. RESPIRATORY: Breathing is even, unlabored. Lung sounds are clear   CARDIOVASCULAR: Heart RRR 3/6 systolic murmur, no, rubs or gallops. No peripheral edema.  GASTROINTESTINAL: Abdomen is soft, non-tender, not distended w/ normal bowel sounds.  NEUROLOGIC: Cranial nerves 2-12 grossly intact. Moves all extremities no tremor.  Patient Active Problem List   Diagnosis Date Noted  . Hx MRSA infection 09/08/2012  . S/P total knee arthroplasty 09/08/2012  . Sepsis 08/18/2011  . Breast cancer metastasized to bone 08/18/2011  . Chronic pain 08/18/2011  . Dehydration 08/18/2011  . CHF (congestive heart failure) 08/18/2011  . Thrombocytopenia 08/18/2011  . Macrocytic anemia 08/18/2011  . CKD (chronic kidney disease) 08/18/2011  . DM type 2 (diabetes mellitus, type 2) 08/18/2011  . OA (osteoarthritis) 08/18/2011  . Fall 08/18/2011  . Essential hypertension 08/18/2011  . Septic joint of left knee joint 08/18/2011  . H/O endocarditis 08/18/2011  . Left leg weakness 06/11/2011  . Difficulty in walking 06/11/2011  . Pain in joint, shoulder region 05/29/2011  . Rotator cuff tear arthropathy of right shoulder 05/29/2011  . Muscle weakness (generalized) 05/29/2011     Assessment and Plan  S/P total knee arthroplasty Pt will be going home with PICC line and will be getting daptomycin IV daily and rifampin 600 mg daily with benadryl before until 11/08/2012. She had had pus develope in her knee revision   PT IS GOING HOME WITH HOME HEALTH CARE, NURSING FOR HER PICC LINE AND IV ANTIBIOTICS AND CONTINUED OT/PT. [PT HAS DONE VERY WELL HERE AND HER MEDICAL PROBLEMS ARE STABLE   Discharge took >35 minutes Margit Hanks, MD   1345- INR just came in-1.4  drawn today; last was 2.1; pt has been refusing her coumadin some days;will continue current dose and check 10/19/2012.

## 2012-10-18 NOTE — Progress Notes (Signed)
This encounter was created in error - please disregard.

## 2013-11-03 ENCOUNTER — Emergency Department (HOSPITAL_COMMUNITY)
Admission: EM | Admit: 2013-11-03 | Discharge: 2013-11-03 | Disposition: A | Discharge status: Home / Self Care | Payer: PRIVATE HEALTH INSURANCE | Attending: Emergency Medicine | Admitting: Emergency Medicine

## 2013-11-03 ENCOUNTER — Emergency Department (HOSPITAL_COMMUNITY): Payer: PRIVATE HEALTH INSURANCE

## 2013-11-03 ENCOUNTER — Encounter (HOSPITAL_COMMUNITY): Payer: Self-pay | Admitting: Emergency Medicine

## 2013-11-03 DIAGNOSIS — E119 Type 2 diabetes mellitus without complications: Secondary | ICD-10-CM | POA: Insufficient documentation

## 2013-11-03 DIAGNOSIS — Z85830 Personal history of malignant neoplasm of bone: Secondary | ICD-10-CM | POA: Diagnosis not present

## 2013-11-03 DIAGNOSIS — Z8739 Personal history of other diseases of the musculoskeletal system and connective tissue: Secondary | ICD-10-CM | POA: Diagnosis not present

## 2013-11-03 DIAGNOSIS — Z853 Personal history of malignant neoplasm of breast: Secondary | ICD-10-CM | POA: Insufficient documentation

## 2013-11-03 DIAGNOSIS — S2232XA Fracture of one rib, left side, initial encounter for closed fracture: Secondary | ICD-10-CM | POA: Diagnosis not present

## 2013-11-03 DIAGNOSIS — Y9389 Activity, other specified: Secondary | ICD-10-CM | POA: Insufficient documentation

## 2013-11-03 DIAGNOSIS — I10 Essential (primary) hypertension: Secondary | ICD-10-CM | POA: Diagnosis not present

## 2013-11-03 DIAGNOSIS — F419 Anxiety disorder, unspecified: Secondary | ICD-10-CM | POA: Diagnosis not present

## 2013-11-03 DIAGNOSIS — Z7952 Long term (current) use of systemic steroids: Secondary | ICD-10-CM | POA: Insufficient documentation

## 2013-11-03 DIAGNOSIS — Z91040 Latex allergy status: Secondary | ICD-10-CM | POA: Diagnosis not present

## 2013-11-03 DIAGNOSIS — Z87448 Personal history of other diseases of urinary system: Secondary | ICD-10-CM | POA: Insufficient documentation

## 2013-11-03 DIAGNOSIS — W1830XA Fall on same level, unspecified, initial encounter: Secondary | ICD-10-CM | POA: Diagnosis not present

## 2013-11-03 DIAGNOSIS — I509 Heart failure, unspecified: Secondary | ICD-10-CM | POA: Diagnosis not present

## 2013-11-03 DIAGNOSIS — Y9289 Other specified places as the place of occurrence of the external cause: Secondary | ICD-10-CM | POA: Diagnosis not present

## 2013-11-03 DIAGNOSIS — R079 Chest pain, unspecified: Secondary | ICD-10-CM

## 2013-11-03 DIAGNOSIS — S29001A Unspecified injury of muscle and tendon of front wall of thorax, initial encounter: Secondary | ICD-10-CM | POA: Diagnosis present

## 2013-11-03 DIAGNOSIS — Z79899 Other long term (current) drug therapy: Secondary | ICD-10-CM | POA: Diagnosis not present

## 2013-11-03 LAB — COMPREHENSIVE METABOLIC PANEL
ALBUMIN: 4.1 g/dL (ref 3.5–5.2)
ALT: 13 U/L (ref 0–35)
AST: 25 U/L (ref 0–37)
Alkaline Phosphatase: 71 U/L (ref 39–117)
Anion gap: 14 (ref 5–15)
BUN: 55 mg/dL — ABNORMAL HIGH (ref 6–23)
CALCIUM: 9.5 mg/dL (ref 8.4–10.5)
CO2: 26 mEq/L (ref 19–32)
Chloride: 99 mEq/L (ref 96–112)
Creatinine, Ser: 1.8 mg/dL — ABNORMAL HIGH (ref 0.50–1.10)
GFR calc Af Amer: 32 mL/min — ABNORMAL LOW (ref >90)
GFR calc non Af Amer: 28 mL/min — ABNORMAL LOW (ref >90)
Glucose, Bld: 91 mg/dL (ref 70–99)
POTASSIUM: 3.7 meq/L (ref 3.7–5.3)
SODIUM: 139 meq/L (ref 137–147)
TOTAL PROTEIN: 7.6 g/dL (ref 6.0–8.3)
Total Bilirubin: 0.4 mg/dL (ref 0.3–1.2)

## 2013-11-03 LAB — PRO B NATRIURETIC PEPTIDE: Pro B Natriuretic peptide (BNP): 6043 pg/mL — ABNORMAL HIGH (ref 0–125)

## 2013-11-03 LAB — PROTIME-INR
INR: 1.05 (ref 0.00–1.49)
PROTHROMBIN TIME: 13.8 s (ref 11.6–15.2)

## 2013-11-03 LAB — TROPONIN I

## 2013-11-03 LAB — CBC
HCT: 30.5 % — ABNORMAL LOW (ref 36.0–46.0)
Hemoglobin: 9.9 g/dL — ABNORMAL LOW (ref 12.0–15.0)
MCH: 33.7 pg (ref 26.0–34.0)
MCHC: 32.5 g/dL (ref 30.0–36.0)
MCV: 103.7 fL — ABNORMAL HIGH (ref 78.0–100.0)
PLATELETS: 139 10*3/uL — AB (ref 150–400)
RBC: 2.94 MIL/uL — ABNORMAL LOW (ref 3.87–5.11)
RDW: 14.4 % (ref 11.5–15.5)
WBC: 5.9 10*3/uL (ref 4.0–10.5)

## 2013-11-03 LAB — MAGNESIUM: MAGNESIUM: 2 mg/dL (ref 1.5–2.5)

## 2013-11-03 MED ORDER — OXYCODONE-ACETAMINOPHEN 10-325 MG PO TABS: 1.0000 | ORAL_TABLET | Freq: Four times a day (QID) | ORAL | Status: DC | PRN

## 2013-11-03 MED ORDER — BUMETANIDE 1 MG PO TABS: 2.0000 mg | ORAL_TABLET | Freq: Two times a day (BID) | ORAL | Status: DC

## 2013-11-03 NOTE — ED Provider Notes (Signed)
CSN: [id number]     Arrival date & time    History   First MD Initiated Contact with Patient 11/03/13 1624     Chief Complaint  Patient presents with  . Chest Pain     (Consider location/radiation/quality/duration/timing/severity/associated sxs/prior Treatment) HPI Patient presents with concern of new chest pain.  Pain began approximately 3 days ago, after a fall.  Since that time she had pain persistently about the left lateral chest.  Pain increased today after a similar fall, both mechanical. No head trauma, loss of consciousness, subsequent confusion, disorientation or weakness in any extremity. Pain is worse with inspiration, sharp, nonradiating. No relief with typical medications for her chronic pain, which includes substantial amounts of narcotics.  Past Medical History  Diagnosis Date  . Kidney disease   . Hypertension   . Diabetes mellitus   . 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 Arthritis   . CHF (congestive heart failure)   . Lymphedema of leg   . Anxiety    Past Surgical History  Procedure Laterality Date  . Breast surgery    . Cholecystectomy    . Abdominal hysterectomy    . Replacement total knee bilateral    . Hand tendon surgery     History reviewed. No pertinent family history. History  Substance Use Topics  . Smoking status: Never Smoker   . Smokeless tobacco: Never Used  . Alcohol Use: No   OB History   Grav Para Term Preterm Abortions TAB SAB Ect Mult Living                 Review of Systems  Constitutional:       Per HPI, otherwise negative  HENT:       Per HPI, otherwise negative  Respiratory:       Per HPI, otherwise negative  Cardiovascular:       Per HPI, otherwise negative  Gastrointestinal: Negative for vomiting.  Endocrine:       Negative aside from HPI  Genitourinary:       Neg aside from HPI   Musculoskeletal:       Per HPI, otherwise negative  Skin: Negative.   Neurological: Negative for syncope.      Allergies    Mometasone; Aspirin; Contrast media; Cortisone; Doxycycline; Ibuprofen; Insulins; Lasix; Lyrica; Other; Prednisone; Sulfamethoxazole; Ultram; Vancomycin; Codeine; Dilantin; Latex; and Zosyn  Home Medications   Prior to Admission medications   Medication Sig Start Date End Date Taking? Authorizing Provider  docusate sodium (COLACE) 100 MG capsule Take 100 mg by mouth 2 (two) times daily.   Yes Historical Provider, MD  esomeprazole (NEXIUM) 40 MG capsule Take 40 mg by mouth daily before breakfast.     Yes Historical Provider, MD  exemestane (AROMASIN) 25 MG tablet Take 25 mg by mouth daily.     Yes Historical Provider, MD  fluticasone (FLONASE) 50 MCG/ACT nasal spray Place 1 spray into the nose daily.   Yes Historical Provider, MD  folic acid (FOLVITE) 1 MG tablet Take 1 mg by mouth daily.   Yes Historical Provider, MD  magnesium oxide (MAG-OX) 400 MG tablet Take 400 mg by mouth daily.   Yes Historical Provider, MD  metoprolol (TOPROL-XL) 200 MG 24 hr tablet Take 200 mg by mouth daily. Takes with a 50 mg tablet to total 250 mg daily   Yes Historical Provider, MD  metoprolol succinate (TOPROL-XL) 50 MG 24 hr tablet Take 50 mg by mouth daily.  Take with Toprol XL 280m for a total of 2573m  Yes Historical Provider, MD  montelukast (SINGULAIR) 10 MG tablet Take 10 mg by mouth at bedtime.     Yes Historical Provider, MD  Multiple Vitamins-Minerals (MULTIVITAMINS THER. W/MINERALS) TABS Take 1 tablet by mouth daily.     Yes Historical Provider, MD  pregabalin (LYRICA) 25 MG capsule Take 25 mg by mouth 3 (three) times daily.   Yes Historical Provider, MD  senna (SENOKOT) 8.6 MG tablet Take 1 tablet by mouth 2 (two) times daily.   Yes Historical Provider, MD  vitamin B-12 (CYANOCOBALAMIN) 100 MCG tablet Take 100 mcg by mouth daily.   Yes Historical Provider, MD  vitamin C (ASCORBIC ACID) 500 MG tablet Take 500 mg by mouth daily.   Yes Historical Provider, MD  vitamin E 400 UNIT capsule Take 400 Units by  mouth daily.   Yes Historical Provider, MD  bumetanide (BUMEX) 1 MG tablet Take 2 tablets (2 mg total) by mouth 2 (two) times daily. 1 q 3 days for weight gain >- 3pounds 11/03/13 11/06/13  RoCarmin MuskratMD  cholecalciferol (VITAMIN D) 400 UNITS TABS tablet Take 400 Units by mouth.    Historical Provider, MD  clonazePAM (KLONOPIN) 0.5 MG tablet Take 1 tablet by mouth twice daily x 30 days. (use for Klonopin) 10/08/12   [name], DO  diphenhydrAMINE (BENADRYL CHILDRENS ALLERGY) 12.5 MG/5ML liquid Take 25 mg by mouth 4 (four) times daily as needed for allergies. 2 tsp po before breakfast, lunch and bedtime and before IV antibiotic admin    Historical Provider, MD  diphenhydrAMINE (BENADRYL) 25 mg capsule Take 25 mg by mouth 2 (two) times daily. Prior to each administration of vancomycin    Historical Provider, MD  loratadine (CLARITIN) 10 MG tablet Take 10 mg by mouth daily.    Historical Provider, MD  oxyCODONE-acetaminophen (PERCOCET) 10-325 MG per tablet Take 1 tablet by mouth every 6 (six) hours as needed for pain. 11/03/13   RoCarmin MuskratMD  polyethylene glycol (MTif[facility] Inc GLFloria Ravelingpacket Take 17 g by mouth daily as needed (constipation).     Historical Provider, MD   BP 127/47  Pulse 62  Temp(Src) 98.3 F (36.8 C)  Resp 16  Ht _0  (1.372 m)  Wt 106 lb (48.081 kg)  BMI 25.54 kg/m2  SpO2 95% Physical Exam  Nursing note and vitals reviewed. Constitutional: She is oriented to person, place, and time. She appears well-developed and well-nourished. No distress.  HENT:  Head: Normocephalic and atraumatic.  Very poor dentition with no active bleedings or discharge.  Eyes: Conjunctivae and EOM are normal.  Cardiovascular: Normal rate and regular rhythm.   Pulmonary/Chest: Effort normal and breath sounds normal. No stridor. No respiratory distress.    Abdominal: She exhibits no distension.  Musculoskeletal: She exhibits no edema.  Neurological: She is alert and oriented to  person, place, and time. No cranial nerve deficit.  Skin: Skin is warm and dry.  Psychiatric: She has a normal mood and affect.    ED Course  Procedures (including critical care time) Labs Review Labs Reviewed  CBC - Abnormal; Notable for the following:    RBC 2.94 (*)    Hemoglobin 9.9 (*)    HCT 30.5 (*)    MCV 103.7 (*)    Platelets 139 (*)    All other components within normal limits  COMPREHENSIVE METABOLIC PANEL - Abnormal; Notable for the following:    BUN 55 (*)  Creatinine, Ser 1.80 (*)    GFR calc non Af Amer 28 (*)    GFR calc Af Amer 32 (*)    All other components within normal limits  PRO B NATRIURETIC PEPTIDE - Abnormal; Notable for the following:    Pro B Natriuretic peptide (BNP) 6043.0 (*)    All other components within normal limits  MAGNESIUM  PROTIME-INR  TROPONIN I    Monitor: 65sr, nml  O2- 99%ra, nml   Imaging Review Dg Ribs Unilateral W/chest Left  11/03/2013   CLINICAL DATA:  Pain and difficulty breathing ; patient fell 3 days prior and again yesterday  EXAM: LEFT RIBS AND CHEST - 3+ VIEW  COMPARISON:  Chest radiograph August 18, 2011  FINDINGS: Frontal chest as well as oblique and cone-down lower rib images were obtained. Lungs are clear. Heart is upper normal in size with pulmonary vascularity within normal limits. No adenopathy.  No effusion or pneumothorax. There are slightly displaced fractures of the anterior left sixth and seventh ribs. No other fractures are appreciable. There is degenerative change in the thoracic and lumbar spine with lumbar levoscoliosis.  IMPRESSION: Slightly displaced fractures of the anterior left sixth and seventh ribs. No pneumothorax. Lungs clear. There is degenerative change in the thoracic and lumbar spine regions.   Electronically Signed   By: Lowella Grip M.D.   On: 11/03/2013 17:35    I discussed the patient w EMS on her arrival.  I reviewed the EMR.   MDM   Final diagnoses:  Chest pain  Rib  fractures, left, closed, initial encounter    Patient presents with ongoing chest pain.  Pain is likely musculoskeletal given the acuity of her pain, the tenderness to palpation.  This is substantiated with x-ray demonstrating 2 rib fractures. Patient is not hypoxic, and distress. Patient had analgesia here, and on discharge. Patient will follow up with primary care.    Carmin Muskrat, MD 11/03/13 [phone]

## 2013-11-03 NOTE — ED Notes (Signed)
RN called pt son [name]) for d/c transport.

## 2013-11-03 NOTE — Discharge Instructions (Signed)
As discussed, your evaluation has demonstrated the presence of 2 broken ribs, as well as elevation in your BNP. Please take all medication as directed, and for the next 3 days, please take your increased dose of Lasix.  You may then switch to your typical dose. Laboratory her for concerning changes in your condition.

## 2013-11-03 NOTE — ED Notes (Signed)
Pt c/o cp since this am. Some sob/weakness. States she fell again today. Pain is worse with movement.

## 2014-01-20 ENCOUNTER — Emergency Department (HOSPITAL_COMMUNITY): Payer: Medicare Other

## 2014-01-20 ENCOUNTER — Encounter (HOSPITAL_COMMUNITY): Payer: Self-pay | Admitting: *Deleted

## 2014-01-20 ENCOUNTER — Emergency Department (HOSPITAL_COMMUNITY)
Admission: EM | Admit: 2014-01-20 | Discharge: 2014-01-21 | Disposition: A | Discharge status: Home / Self Care | Payer: Medicare Other | Attending: Emergency Medicine | Admitting: Emergency Medicine

## 2014-01-20 DIAGNOSIS — N289 Disorder of kidney and ureter, unspecified: Secondary | ICD-10-CM | POA: Insufficient documentation

## 2014-01-20 DIAGNOSIS — J40 Bronchitis, not specified as acute or chronic: Secondary | ICD-10-CM | POA: Diagnosis not present

## 2014-01-20 DIAGNOSIS — I509 Heart failure, unspecified: Secondary | ICD-10-CM | POA: Diagnosis not present

## 2014-01-20 DIAGNOSIS — Z7952 Long term (current) use of systemic steroids: Secondary | ICD-10-CM | POA: Diagnosis not present

## 2014-01-20 DIAGNOSIS — F419 Anxiety disorder, unspecified: Secondary | ICD-10-CM | POA: Diagnosis not present

## 2014-01-20 DIAGNOSIS — R091 Pleurisy: Secondary | ICD-10-CM | POA: Diagnosis present

## 2014-01-20 DIAGNOSIS — R05 Cough: Secondary | ICD-10-CM

## 2014-01-20 DIAGNOSIS — I10 Essential (primary) hypertension: Secondary | ICD-10-CM | POA: Diagnosis not present

## 2014-01-20 DIAGNOSIS — Z91040 Latex allergy status: Secondary | ICD-10-CM | POA: Insufficient documentation

## 2014-01-20 DIAGNOSIS — R079 Chest pain, unspecified: Secondary | ICD-10-CM | POA: Diagnosis not present

## 2014-01-20 DIAGNOSIS — Z853 Personal history of malignant neoplasm of breast: Secondary | ICD-10-CM | POA: Diagnosis not present

## 2014-01-20 DIAGNOSIS — E119 Type 2 diabetes mellitus without complications: Secondary | ICD-10-CM | POA: Insufficient documentation

## 2014-01-20 DIAGNOSIS — Z79899 Other long term (current) drug therapy: Secondary | ICD-10-CM | POA: Insufficient documentation

## 2014-01-20 DIAGNOSIS — M549 Dorsalgia, unspecified: Secondary | ICD-10-CM | POA: Insufficient documentation

## 2014-01-20 DIAGNOSIS — R059 Cough, unspecified: Secondary | ICD-10-CM

## 2014-01-20 LAB — BASIC METABOLIC PANEL
Anion gap: 14 (ref 5–15)
BUN: 72 mg/dL — ABNORMAL HIGH (ref 6–23)
CALCIUM: 10.7 mg/dL — AB (ref 8.4–10.5)
CO2: 32 mmol/L (ref 19–32)
Chloride: 89 mEq/L — ABNORMAL LOW (ref 96–112)
Creatinine, Ser: 2.94 mg/dL — ABNORMAL HIGH (ref 0.50–1.10)
GFR calc non Af Amer: 15 mL/min — ABNORMAL LOW (ref >90)
GFR, EST AFRICAN AMERICAN: 18 mL/min — AB (ref >90)
Glucose, Bld: 100 mg/dL — ABNORMAL HIGH (ref 70–99)
Potassium: 3.9 mmol/L (ref 3.5–5.1)
Sodium: 135 mmol/L (ref 135–145)

## 2014-01-20 LAB — CBC WITH DIFFERENTIAL/PLATELET
BASOS ABS: 0 10*3/uL (ref 0.0–0.1)
Basophils Relative: 0 % (ref 0–1)
EOS ABS: 0.2 10*3/uL (ref 0.0–0.7)
Eosinophils Relative: 2 % (ref 0–5)
HEMATOCRIT: 29.6 % — AB (ref 36.0–46.0)
Hemoglobin: 9.5 g/dL — ABNORMAL LOW (ref 12.0–15.0)
LYMPHS ABS: 1.5 10*3/uL (ref 0.7–4.0)
LYMPHS PCT: 21 % (ref 12–46)
MCH: 33.3 pg (ref 26.0–34.0)
MCHC: 32.1 g/dL (ref 30.0–36.0)
MCV: 103.9 fL — AB (ref 78.0–100.0)
Monocytes Absolute: 0.6 10*3/uL (ref 0.1–1.0)
Monocytes Relative: 9 % (ref 3–12)
NEUTROS PCT: 68 % (ref 43–77)
Neutro Abs: 4.8 10*3/uL (ref 1.7–7.7)
Platelets: 143 10*3/uL — ABNORMAL LOW (ref 150–400)
RBC: 2.85 MIL/uL — ABNORMAL LOW (ref 3.87–5.11)
RDW: 14.6 % (ref 11.5–15.5)
WBC: 7 10*3/uL (ref 4.0–10.5)

## 2014-01-20 MED ORDER — ALBUTEROL SULFATE HFA 108 (90 BASE) MCG/ACT IN AERS
2.0000 | INHALATION_SPRAY | Freq: Four times a day (QID) | RESPIRATORY_TRACT | Status: DC | PRN
  Administered 2014-01-21: 2 via RESPIRATORY_TRACT
  Filled 2014-01-20: qty 6.7

## 2014-01-20 MED ORDER — DM-GUAIFENESIN ER 30-600 MG PO TB12: 1.0000 | ORAL_TABLET | Freq: Two times a day (BID) | ORAL | Status: DC

## 2014-01-20 MED ORDER — SODIUM CHLORIDE 0.9 % IV SOLN: INTRAVENOUS | Status: DC

## 2014-01-20 MED ORDER — SODIUM CHLORIDE 0.9 % IV BOLUS (SEPSIS)
250.0000 mL | Freq: Once | INTRAVENOUS | Status: AC
  Administered 2014-01-20: 250 mL via INTRAVENOUS

## 2014-01-20 NOTE — ED Provider Notes (Signed)
CSN: [id number]     Arrival date & time 01/20/14  1707 History  This chart was scribed for Catherine Sorrow, MD by Delphia Grates, ED Scribe. This patient was seen in room APA09/APA09 and the patient's care was started at 8:57 PM.   Chief Complaint  Patient presents with  . URI  . Pleurisy     Patient is a 69 y.o. female presenting with URI. The history is provided by the patient. No language interpreter was used.  URI Presenting symptoms: congestion, cough and fever   Severity:  Moderate Timing:  Constant Progression:  Unchanged Chronicity:  New Relieved by:  Nothing Worsened by:  Nothing tried Ineffective treatments: Z-pack. Associated symptoms: headaches and wheezing      HPI Comments: MYLEI BRACKEEN is a 69 y.o. female who presents to the Emergency Department complaining of URI that has been ongoing for several weeks. Patient has been treated with a Z-pack approximately 1 week ago without significant relief. There is associated cough, congestion, wheezing, in addition to back pain and chest pain with cough only. Patient also notes generalized abdominal pain and vomiting with onset 2 days ago. Patient states she has been eaten since vomiting, but is drinking fluids. [facility] a [facility], but has Medical Alert.  [facility] care received at [facility].    Past Medical History  Diagnosis Date  . Kidney disease   . Hypertension   . Diabetes mellitus   . 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 Arthritis   . CHF (congestive heart failure)   . Lymphedema of leg   . Anxiety    Past Surgical History  Procedure Laterality Date  . Breast surgery    . Cholecystectomy    . Abdominal hysterectomy    . Replacement total knee bilateral    . Hand tendon surgery     History reviewed. No pertinent family history. History  Substance Use Topics  . Smoking status: Never Smoker   . Smokeless tobacco: Never Used  . Alcohol Use: No   OB History     No data available     Review of Systems  Constitutional: Positive for fever and chills.  HENT: Positive for congestion.   Eyes: Positive for visual disturbance (blurred vision).  Respiratory: Positive for cough, shortness of breath and wheezing.   Cardiovascular: Positive for chest pain (with cough only) and leg swelling.  Gastrointestinal: Positive for nausea, vomiting and abdominal pain. Negative for diarrhea.  Genitourinary: Negative for dysuria.  Musculoskeletal: Positive for back pain (with cough only).  Skin: Negative for rash.  Neurological: Positive for headaches.  Hematological: Does not bruise/bleed easily.  Psychiatric/Behavioral: Negative for confusion.      Allergies  Mometasone; Vancomycin; Aspirin; Contrast media; Cortisone; Doxycycline; Ibuprofen; Insulins; Lasix; Other; Prednisone; Sulfamethoxazole; Ultram; Codeine; Dilantin; Latex; and Zosyn  Home Medications   Prior to Admission medications   Medication Sig Start Date End Date Taking? Authorizing Provider  bumetanide (BUMEX) 1 MG tablet Take 1 mg by mouth 2 (two) times daily. 12/14/13  Yes Historical Provider, MD  cholecalciferol (VITAMIN D) 400 UNITS TABS tablet Take 400 Units by mouth daily.    Yes Historical Provider, MD  clonazePAM (KLONOPIN) 0.5 MG tablet Take 1 tablet by mouth twice daily x 30 days. (use for Klonopin) Patient taking differently: Take 0.5 mg by mouth 2 (two) times daily.  10/08/12  Yes [name], DO  docusate sodium (COLACE) 100 MG capsule Take  100 mg by mouth 2 (two) times daily.   Yes Historical Provider, MD  esomeprazole (NEXIUM) 40 MG capsule Take 40 mg by mouth daily before breakfast.     Yes Historical Provider, MD  exemestane (AROMASIN) 25 MG tablet Take 25 mg by mouth daily.     Yes Historical Provider, MD  fluticasone (FLONASE) 50 MCG/ACT nasal spray Place 1 spray into the nose daily.   Yes Historical Provider, MD  folic acid (FOLVITE) 1 MG tablet Take 1 mg by mouth daily.    Yes Historical Provider, MD  glycerin adult 2 G SUPP Place 1 suppository rectally daily as needed for mild constipation or moderate constipation.   Yes Historical Provider, MD  loratadine (CLARITIN) 10 MG tablet Take 10 mg by mouth daily.   Yes Historical Provider, MD  LYRICA 75 MG capsule Take 75 mg by mouth 3 (three) times daily. 12/14/13  Yes Historical Provider, MD  magnesium oxide (MAG-OX) 400 MG tablet Take 400 mg by mouth daily.   Yes Historical Provider, MD  metoprolol (TOPROL-XL) 200 MG 24 hr tablet Take 200 mg by mouth daily. Takes with a 50 mg tablet to total 250 mg daily   Yes Historical Provider, MD  metoprolol succinate (TOPROL-XL) 50 MG 24 hr tablet Take 50 mg by mouth daily. Take with Toprol XL 237m for a total of 2542m  Yes Historical Provider, MD  montelukast (SINGULAIR) 10 MG tablet Take 10 mg by mouth at bedtime.     Yes Historical Provider, MD  Multiple Vitamins-Minerals (MULTIVITAMINS THER. W/MINERALS) TABS Take 1 tablet by mouth daily.     Yes Historical Provider, MD  oxyCODONE-acetaminophen (PERCOCET) 7.5-325 MG per tablet 1 tablet every 4 (four) hours as needed for pain.  12/29/13  Yes Historical Provider, MD  polyethylene glycol (MIRALAX / GLYCOLAX) packet Take 17 g by mouth daily as needed (constipation).    Yes Historical Provider, MD  vitamin B-12 (CYANOCOBALAMIN) 100 MCG tablet Take 100 mcg by mouth daily.   Yes Historical Provider, MD  vitamin C (ASCORBIC ACID) 500 MG tablet Take 500 mg by mouth daily.   Yes Historical Provider, MD  vitamin E 400 UNIT capsule Take 400 Units by mouth daily.   Yes Historical Provider, MD  dextromethorphan-guaiFENesin (MUCINEX DM) 30-600 MG per 12 hr tablet Take 1 tablet by mouth 2 (two) times daily. 01/20/14   ScFredia SorrowMD  oxyCODONE-acetaminophen (PERCOCET) 10-325 MG per tablet Take 1 tablet by mouth every 6 (six) hours as needed for pain. Patient not taking: Reported on 01/20/2014 11/03/13   RoCarmin MuskratMD   Triage Vitals:  BP 165/55 mmHg  Pulse 105  Temp(Src) 98.8 F (37.1 C) (Oral)  Resp 22  Ht 4' 6"  (1.372 m)  Wt 118 lb (53.524 kg)  BMI 28.43 kg/m2  SpO2 97%  Physical Exam  Constitutional: She is oriented to person, place, and time. She appears well-developed and well-nourished. No distress.  HENT:  Head: Normocephalic and atraumatic.  Eyes: Conjunctivae and EOM are normal.  Neck: Neck supple. No tracheal deviation present.  Cardiovascular: Normal rate, regular rhythm and normal heart sounds.   Pulmonary/Chest: Effort normal. No respiratory distress. She has no wheezes. She has rhonchi. She has no rales.  Bilateral rhonchi, but no rales or wheezes.   Abdominal: Soft. Bowel sounds are normal. There is tenderness.  Mild tenderness throughout.   Musculoskeletal: Normal range of motion. She exhibits no edema.  No swelling of the ankles.  Neurological: She is alert and  oriented to person, place, and time.  Skin: Skin is warm and dry.  Psychiatric: She has a normal mood and affect. Her behavior is normal.  Nursing note and vitals reviewed.   ED Course  Procedures (including critical care time)  DIAGNOSTIC STUDIES: Oxygen Saturation is 97% on room air, adequate by my interpretation.    COORDINATION OF CARE: At 2104 Discussed treatment plan with patient which includes CXR. Patient agrees.   Medications  0.9 %  sodium chloride infusion (not administered)  albuterol (PROVENTIL HFA;VENTOLIN HFA) 108 (90 BASE) MCG/ACT inhaler 2 puff (not administered)  sodium chloride 0.9 % bolus 250 mL (250 mLs Intravenous New Bag/Given 01/20/14 2210)    Results for orders placed or performed during the hospital encounter of 01/20/14  CBC with Differential  Result Value Ref Range   WBC 7.0 4.0 - 10.5 K/uL   RBC 2.85 (L) 3.87 - 5.11 MIL/uL   Hemoglobin 9.5 (L) 12.0 - 15.0 g/dL   HCT 29.6 (L) 36.0 - 46.0 %   MCV 103.9 (H) 78.0 - 100.0 fL   MCH 33.3 26.0 - 34.0 pg   MCHC 32.1 30.0 - 36.0 g/dL   RDW 14.6 11.5 -  15.5 %   Platelets 143 (L) 150 - 400 K/uL   Neutrophils Relative % 68 43 - 77 %   Neutro Abs 4.8 1.7 - 7.7 K/uL   Lymphocytes Relative 21 12 - 46 %   Lymphs Abs 1.5 0.7 - 4.0 K/uL   Monocytes Relative 9 3 - 12 %   Monocytes Absolute 0.6 0.1 - 1.0 K/uL   Eosinophils Relative 2 0 - 5 %   Eosinophils Absolute 0.2 0.0 - 0.7 K/uL   Basophils Relative 0 0 - 1 %   Basophils Absolute 0.0 0.0 - 0.1 K/uL  Basic metabolic panel  Result Value Ref Range   Sodium 135 135 - 145 mmol/L   Potassium 3.9 3.5 - 5.1 mmol/L   Chloride 89 (L) 96 - 112 mEq/L   CO2 32 19 - 32 mmol/L   Glucose, Bld 100 (H) 70 - 99 mg/dL   BUN 72 (H) 6 - 23 mg/dL   Creatinine, Ser 2.94 (H) 0.50 - 1.10 mg/dL   Calcium 10.7 (H) 8.4 - 10.5 mg/dL   GFR calc non Af Amer 15 (L) >90 mL/min   GFR calc Af Amer 18 (L) >90 mL/min   Anion gap 14 5 - 15   Imaging Review Dg Chest 2 View  01/20/2014   CLINICAL DATA:  Productive cough for 1 month.  EXAM: CHEST  2 VIEW  COMPARISON:  Chest and left rib series 11/03/2013  FINDINGS: The cardiomediastinal contours are unchanged. Borderline left hilar prominence is unchanged from prior. Pulmonary vasculature is normal. There is no consolidation to suggest pneumonia. No pleural effusion or pneumothorax. The previously described lateral left-sided rib fractures are again seen, not as well appreciated on the current exam. There is scoliotic curvature of the lower thoracic and upper lumbar spine.  IMPRESSION: No acute pulmonary process. The previous left-sided rib fractures are again seen, unchanged.   Electronically Signed   By: Jeb Levering M.D.   On: 01/20/2014 20:37       EKG Interpretation None      MDM   Final diagnoses:  Bronchitis  Renal insufficiency    Patient symptoms consistent with a viral upper respiratory infection with bronchitis. Chest x-ray negative for pneumonia. No evidence of any pulmonary edema or pleural effusion. Patient for this or  probably best treated with  albuterol inhaler and Mucinex DM.  Patient's renal function is worse from her baseline she is known to have renal insufficiency. Patient without any elevation in the potassium. Patient has follow-up in one week with her doctor at [facility] will provide her with her lab results here. Patient no she needs to have her creatinine rechecked. Patient will return for any new or worse symptoms.  I personally performed the services described in this documentation, which was scribed in my presence. The recorded information has been reviewed and is accurate.     Catherine Sorrow, MD 01/20/14 [phone]

## 2014-01-20 NOTE — Discharge Instructions (Signed)
Chest x-ray negative for pneumonia or fluid on the lungs. Use the albuterol inhaler to help with the cough and keep your lungs open for the next 7 days. Also can take the Mucinex DM to help suppress the cough and loosen the phlegm. As we discussed her renal functions will bit worse than usual. Recommend follow-up with your doctor at [facility] in the next week or so for repeat creatinine. Return for any new or worse symptoms.

## 2014-01-20 NOTE — ED Notes (Signed)
Pt states she has had an URI for several weeks, has been treated with Z-pack by PCP, cough and congestion not any better, pt states her ribs hurt from coughing. Pt has HX of cancer, states she feels dehydrated.

## 2014-01-20 NOTE — ED Notes (Signed)
MD at bedside. 

## 2014-03-04 ENCOUNTER — Emergency Department (HOSPITAL_COMMUNITY): Payer: Medicare Other

## 2014-03-04 ENCOUNTER — Emergency Department (HOSPITAL_COMMUNITY)
Admission: EM | Admit: 2014-03-04 | Discharge: 2014-03-04 | Disposition: A | Discharge status: Home / Self Care | Payer: Medicare Other | Attending: Emergency Medicine | Admitting: Emergency Medicine

## 2014-03-04 ENCOUNTER — Encounter (HOSPITAL_COMMUNITY): Payer: Self-pay | Admitting: *Deleted

## 2014-03-04 DIAGNOSIS — Y998 Other external cause status: Secondary | ICD-10-CM | POA: Diagnosis not present

## 2014-03-04 DIAGNOSIS — E872 Acidosis, unspecified: Secondary | ICD-10-CM

## 2014-03-04 DIAGNOSIS — W19XXXA Unspecified fall, initial encounter: Secondary | ICD-10-CM

## 2014-03-04 DIAGNOSIS — F419 Anxiety disorder, unspecified: Secondary | ICD-10-CM | POA: Insufficient documentation

## 2014-03-04 DIAGNOSIS — Z7951 Long term (current) use of inhaled steroids: Secondary | ICD-10-CM | POA: Insufficient documentation

## 2014-03-04 DIAGNOSIS — R0600 Dyspnea, unspecified: Secondary | ICD-10-CM | POA: Insufficient documentation

## 2014-03-04 DIAGNOSIS — N189 Chronic kidney disease, unspecified: Secondary | ICD-10-CM | POA: Diagnosis not present

## 2014-03-04 DIAGNOSIS — S3992XA Unspecified injury of lower back, initial encounter: Secondary | ICD-10-CM | POA: Diagnosis present

## 2014-03-04 DIAGNOSIS — Z79899 Other long term (current) drug therapy: Secondary | ICD-10-CM | POA: Insufficient documentation

## 2014-03-04 DIAGNOSIS — M545 Low back pain, unspecified: Secondary | ICD-10-CM

## 2014-03-04 DIAGNOSIS — I509 Heart failure, unspecified: Secondary | ICD-10-CM | POA: Diagnosis not present

## 2014-03-04 DIAGNOSIS — Y92000 Kitchen of unspecified non-institutional (private) residence as  the place of occurrence of the external cause: Secondary | ICD-10-CM | POA: Insufficient documentation

## 2014-03-04 DIAGNOSIS — E119 Type 2 diabetes mellitus without complications: Secondary | ICD-10-CM | POA: Diagnosis not present

## 2014-03-04 DIAGNOSIS — I129 Hypertensive chronic kidney disease with stage 1 through stage 4 chronic kidney disease, or unspecified chronic kidney disease: Secondary | ICD-10-CM | POA: Insufficient documentation

## 2014-03-04 DIAGNOSIS — S0990XA Unspecified injury of head, initial encounter: Secondary | ICD-10-CM | POA: Diagnosis not present

## 2014-03-04 DIAGNOSIS — Y9301 Activity, walking, marching and hiking: Secondary | ICD-10-CM | POA: Insufficient documentation

## 2014-03-04 DIAGNOSIS — Y92009 Unspecified place in unspecified non-institutional (private) residence as the place of occurrence of the external cause: Secondary | ICD-10-CM

## 2014-03-04 DIAGNOSIS — W01198A Fall on same level from slipping, tripping and stumbling with subsequent striking against other object, initial encounter: Secondary | ICD-10-CM | POA: Insufficient documentation

## 2014-03-04 DIAGNOSIS — Z853 Personal history of malignant neoplasm of breast: Secondary | ICD-10-CM | POA: Insufficient documentation

## 2014-03-04 DIAGNOSIS — M549 Dorsalgia, unspecified: Secondary | ICD-10-CM

## 2014-03-04 LAB — COMPREHENSIVE METABOLIC PANEL
ALK PHOS: 54 U/L (ref 39–117)
ALT: 16 U/L (ref 0–35)
AST: 24 U/L (ref 0–37)
Albumin: 4.1 g/dL (ref 3.5–5.2)
Anion gap: 10 (ref 5–15)
BILIRUBIN TOTAL: 0.3 mg/dL (ref 0.3–1.2)
BUN: 53 mg/dL — ABNORMAL HIGH (ref 6–23)
CHLORIDE: 108 mmol/L (ref 96–112)
CO2: 14 mmol/L — ABNORMAL LOW (ref 19–32)
Calcium: 9 mg/dL (ref 8.4–10.5)
Creatinine, Ser: 2.99 mg/dL — ABNORMAL HIGH (ref 0.50–1.10)
GFR, EST AFRICAN AMERICAN: 18 mL/min — AB (ref >90)
GFR, EST NON AFRICAN AMERICAN: 15 mL/min — AB (ref >90)
GLUCOSE: 88 mg/dL (ref 70–99)
Potassium: 4.5 mmol/L (ref 3.5–5.1)
Sodium: 132 mmol/L — ABNORMAL LOW (ref 135–145)
Total Protein: 7.1 g/dL (ref 6.0–8.3)

## 2014-03-04 LAB — BRAIN NATRIURETIC PEPTIDE: B Natriuretic Peptide: 1040 pg/mL — ABNORMAL HIGH (ref 0.0–100.0)

## 2014-03-04 LAB — CBC WITH DIFFERENTIAL/PLATELET
BASOS ABS: 0 10*3/uL (ref 0.0–0.1)
Basophils Relative: 0 % (ref 0–1)
EOS PCT: 1 % (ref 0–5)
Eosinophils Absolute: 0.1 10*3/uL (ref 0.0–0.7)
HCT: 32 % — ABNORMAL LOW (ref 36.0–46.0)
HEMOGLOBIN: 10.2 g/dL — AB (ref 12.0–15.0)
LYMPHS PCT: 2 % — AB (ref 12–46)
Lymphs Abs: 0.1 10*3/uL — ABNORMAL LOW (ref 0.7–4.0)
MCH: 33.7 pg (ref 26.0–34.0)
MCHC: 31.9 g/dL (ref 30.0–36.0)
MCV: 105.6 fL — AB (ref 78.0–100.0)
MONO ABS: 0.2 10*3/uL (ref 0.1–1.0)
Monocytes Relative: 2 % — ABNORMAL LOW (ref 3–12)
Neutro Abs: 7.2 10*3/uL (ref 1.7–7.7)
Neutrophils Relative %: 95 % — ABNORMAL HIGH (ref 43–77)
PLATELETS: 130 10*3/uL — AB (ref 150–400)
RBC: 3.03 MIL/uL — ABNORMAL LOW (ref 3.87–5.11)
RDW: 16.4 % — ABNORMAL HIGH (ref 11.5–15.5)
WBC: 7.6 10*3/uL (ref 4.0–10.5)

## 2014-03-04 LAB — TROPONIN I: Troponin I: <0.03 ng/mL (ref <0.031)

## 2014-03-04 MED ORDER — OXYCODONE-ACETAMINOPHEN 5-325 MG PO TABS
1.0000 | ORAL_TABLET | Freq: Once | ORAL | Status: AC
  Administered 2014-03-04: 1 via ORAL
  Filled 2014-03-04: qty 1

## 2014-03-04 MED ORDER — SODIUM BICARBONATE 650 MG PO TABS: 650.0000 mg | ORAL_TABLET | Freq: Two times a day (BID) | ORAL | Status: DC

## 2014-03-04 NOTE — ED Provider Notes (Signed)
Pt sleeping and feels well No hypoxia Discussed lab findings Need for f/u discussed She reports her nephrologist is at [facility] She thinks she may be on NAbicab but unsure Rx given to patient BP 113/54 mmHg  Pulse 80  Temp(Src) 98.9 F (37.2 C) (Oral)  Resp 18  Ht 4\' 6"  (1.372 m)  Wt 118 lb (53.524 kg)  BMI 28.43 kg/m2  SpO2 93%   Sharyon Cable, MD 03/04/14 1014

## 2014-03-04 NOTE — Discharge Instructions (Signed)
You have had a head injury which does not appear to require admission at this time. A concussion is a state of changed mental ability from trauma.  SEEK IMMEDIATE MEDICAL ATTENTION IF: There is confusion or drowsiness (although children frequently become drowsy after injury).  You cannot awaken the injured person.  There is nausea (feeling sick to your stomach) or continued, forceful vomiting.  You notice dizziness or unsteadiness which is getting worse, or inability to walk.  You have convulsions or unconsciousness.  You experience severe, persistent headaches not relieved by Tylenol. (Do not take aspirin as this impairs clotting abilities). Take other pain medications only as directed.  You cannot use arms or legs normally.  There are changes in pupil sizes. (This is the black center in the colored part of the eye)  There is clear or bloody discharge from the nose or ears.  Change in speech, vision, swallowing, or understanding.  Localized weakness, numbness, tingling, or change in bowel or bladder control.    Arthralgia Arthralgia is joint pain. A joint is a place where two bones meet. Joint pain can happen for many reasons. The joint can be bruised, stiff, infected, or weak from aging. Pain usually goes away after resting and taking medicine for soreness.  HOME CARE  Rest the joint as told by your doctor.  Keep the sore joint raised (elevated) for the first 24 hours.  Put ice on the joint area.  Put ice in a plastic bag.  Place a towel between your skin and the bag.  Leave the ice on for 15-20 minutes, 03-04 times a day.  Wear your splint, casting, elastic bandage, or sling as told by your doctor.  Only take medicine as told by your doctor. Do not take aspirin.  Use crutches as told by your doctor. Do not put weight on the joint until told to by your doctor. GET HELP RIGHT AWAY IF:   You have bruising, puffiness (swelling), or more pain.  Your fingers or toes turn blue  or start to lose feeling (numb).  Your medicine does not lessen the pain.  Your pain becomes severe.  You have a temperature by mouth above 102 F (38.9 C), not controlled by medicine.  You cannot move or use the joint. MAKE SURE YOU:   Understand these instructions.  Will watch your condition.  Will get help right away if you are not doing well or get worse. Document Released: 12/11/2008 Document Revised: 03/17/2011 Document Reviewed: 12/11/2008 [facility] Patient Information 2015 [place], [place]. This information is not intended to replace advice given to you by your health care provider. Make sure you discuss any questions you have with your health care provider.

## 2014-03-04 NOTE — ED Provider Notes (Signed)
CSN: [id number]     Arrival date & time 03/04/14  0421 History   First MD Initiated Contact with Patient 03/04/14 0422     Chief Complaint  Patient presents with  . Fall    fell off couch and laid on floor for an hour.  patient pushed life alert button for help.  she lives alone and waited for ems to arrive for help  . Back Pain     (Consider location/radiation/quality/duration/timing/severity/associated sxs/prior Treatment) HPI  Patient states she was sitting on the sofa watching TV movies. She states she had felt cold and she had 2 blankets on. She states she had a mild headache and she was trying to get up to go to the kitchen to get something and she accidentally slid off the edge of the sofa. She states she fell forward and hit the front of her forehead. She states she tried to get up on her knees to get up however she was unable to get up. She states she crawled into the kitchen and used a broom handle to unlock the kitchen door. She was able to use her panic alert button to summon help. She denies loss of consciousness, nausea, vomiting, change in her vision. 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 However since she fell she feels like her back got jarred her pain is worse. She states her whole back hurts. She also reports chronic shortness of breath because she has a leaky heart valve with congestive heart failure that is going to need surgery. Patient states she is supposed to use a walker however she did not have her walker by her couch to help her get up.    All her physicians are at [facility]  Dr Wess Botts Pain [facility] PCP Dr Cordelia Pen Cardiology Dr Alford Highland  Past Medical History  Diagnosis Date  . Kidney disease   . Hypertension   . Diabetes mellitus   . 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 Arthritis   . CHF (congestive heart failure)   . Lymphedema of leg   . Anxiety     Past Surgical History  Procedure Laterality Date  . Breast surgery    . Cholecystectomy    . Abdominal hysterectomy    . Replacement total knee bilateral    . Hand tendon surgery     History reviewed. No pertinent family history. History  Substance Use Topics  . Smoking status: Never Smoker   . Smokeless tobacco: Never Used  . Alcohol Use: No   lives alone Lives at home Uses a walker  OB History    No data available     Review of Systems  All other systems reviewed and are negative.     Allergies  Mometasone; Vancomycin; Aspirin; Contrast media; Cortisone; Doxycycline; Ibuprofen; Insulins; Lasix; Other; Prednisone; Sulfamethoxazole; Ultram; Codeine; Dilantin; Latex; and Zosyn  Home Medications   Prior to Admission medications   Medication Sig Start Date End Date Taking? Authorizing Provider  bumetanide (BUMEX) 1 MG tablet Take 1 mg by mouth 2 (two) times daily. 12/14/13   Historical Provider, MD  cholecalciferol (VITAMIN D) 400 UNITS TABS tablet Take 400 Units by mouth daily.     Historical Provider, MD  clonazePAM (KLONOPIN) 0.5 MG tablet Take 1 tablet by mouth twice daily x 30 days. (use for Klonopin) Patient taking differently: Take 0.5 mg by mouth 2 (  two) times daily.  10/08/12   [name], DO  dextromethorphan-guaiFENesin (MUCINEX DM) 30-600 MG per 12 hr tablet Take 1 tablet by mouth 2 (two) times daily. 01/20/14   Fredia Sorrow, MD  docusate sodium (COLACE) 100 MG capsule Take 100 mg by mouth 2 (two) times daily.    Historical Provider, MD  esomeprazole (NEXIUM) 40 MG capsule Take 40 mg by mouth daily before breakfast.      Historical Provider, MD  exemestane (AROMASIN) 25 MG tablet Take 25 mg by mouth daily.      Historical Provider, MD  fluticasone (FLONASE) 50 MCG/ACT nasal spray Place 1 spray into the nose daily.    Historical Provider, MD  folic acid (FOLVITE) 1 MG tablet Take 1 mg by mouth daily.    Historical Provider, MD  glycerin adult 2 G SUPP  Place 1 suppository rectally daily as needed for mild constipation or moderate constipation.    Historical Provider, MD  loratadine (CLARITIN) 10 MG tablet Take 10 mg by mouth daily.    Historical Provider, MD  LYRICA 75 MG capsule Take 75 mg by mouth 3 (three) times daily. 12/14/13   Historical Provider, MD  magnesium oxide (MAG-OX) 400 MG tablet Take 400 mg by mouth daily.    Historical Provider, MD  metoprolol (TOPROL-XL) 200 MG 24 hr tablet Take 200 mg by mouth daily. Takes with a 50 mg tablet to total 250 mg daily    Historical Provider, MD  metoprolol succinate (TOPROL-XL) 50 MG 24 hr tablet Take 50 mg by mouth daily. Take with Toprol XL 213m for a total of 2580m   Historical Provider, MD  montelukast (SINGULAIR) 10 MG tablet Take 10 mg by mouth at bedtime.      Historical Provider, MD  Multiple Vitamins-Minerals (MULTIVITAMINS THER. W/MINERALS) TABS Take 1 tablet by mouth daily.      Historical Provider, MD  oxyCODONE-acetaminophen (PERCOCET) 10-325 MG per tablet Take 1 tablet by mouth every 6 (six) hours as needed for pain. Patient not taking: Reported on 01/20/2014 11/03/13   RoCarmin MuskratMD  oxyCODONE-acetaminophen (PERCOCET) 7.5-325 MG per tablet 1 tablet every 4 (four) hours as needed for pain.  12/29/13   Historical Provider, MD  polyethylene glycol (MIRALAX / GLYCOLAX) packet Take 17 g by mouth daily as needed (constipation).     Historical Provider, MD  vitamin B-12 (CYANOCOBALAMIN) 100 MCG tablet Take 100 mcg by mouth daily.    Historical Provider, MD  vitamin C (ASCORBIC ACID) 500 MG tablet Take 500 mg by mouth daily.    Historical Provider, MD  vitamin E 400 UNIT capsule Take 400 Units by mouth daily.    Historical Provider, MD   BP 122/54 mmHg  Pulse 82  Temp(Src) 98.8 F (37.1 C) (Oral)  Resp 18  Ht _0  (1.372 m)  Wt 118 lb (53.524 kg)  BMI 28.43 kg/m2  SpO2 94%  Vital signs normal   Physical Exam  Constitutional: She is oriented to person, place, and time. She  appears well-developed and well-nourished.  Non-toxic appearance. She does not appear ill. No distress.  HENT:  Head: Normocephalic and atraumatic.  Right Ear: External ear normal.  Left Ear: External ear normal.  Nose: Nose normal. No mucosal edema or rhinorrhea.  Mouth/Throat: Oropharynx is clear and moist and mucous membranes are normal. No dental abscesses or uvula swelling.  Patient states when she fell she hit her forehead however I do not see any obvious bruising, swelling, or abrasions  to her head.  Eyes: Conjunctivae and EOM are normal. Pupils are equal, round, and reactive to light.  Neck: Normal range of motion and full passive range of motion without pain. Neck supple.  Cardiovascular: Normal rate, regular rhythm and normal heart sounds.  Exam reveals no gallop and no friction rub.   No murmur heard. Pulmonary/Chest: Effort normal and breath sounds normal. No respiratory distress. She has no wheezes. She has no rhonchi. She has no rales. She exhibits no tenderness and no crepitus.  Abdominal: Soft. Normal appearance and bowel sounds are normal. She exhibits no distension. There is no tenderness. There is no rebound and no guarding.  Musculoskeletal: Normal range of motion. She exhibits no edema or tenderness.  Patient complains of soreness in her knees when she tries to flex them. Patient has diffuse pain to palpation in her thoracic and lumbar spine even down into the coccyx. She does not have any focal tenderness.  Neurological: She is alert and oriented to person, place, and time. She has normal strength. No cranial nerve deficit.  Skin: Skin is warm, dry and intact. No rash noted. No erythema. No pallor.  Psychiatric: She has a normal mood and affect. Her speech is normal and behavior is normal. Her mood appears not anxious.  Nursing note and vitals reviewed.   ED Course  Procedures (including critical care time)  Medications  oxyCODONE-acetaminophen (PERCOCET/ROXICET)  5-325 MG per tablet 1 tablet (1 tablet Oral Given 03/04/14 0516)   Review of the [place] shows patient has been getting oxycodone 7.5/325 mg on a regular basis for the past 6 months. She was getting #90 tablets monthly until January 28 when she got #120 tablets.  07:05 pt sleeping, discussed her xray results. Staff is going to see if she can stand up. She states she feels like she can go home.  7:12 AM staff ambulated patient with a walker to the door. She complained of shortness of breath and dizziness. They report her pulse ox was in the low 90s.  7:20 AM when I talked to the patient she states she has a "leaky heart valve". She reports she was fine earlier today however during the night she has been getting more short of breath. She had not complained of this earlier when I saw her. Chest x-ray and labs were ordered. Patient will be turned over to Dr. Christy Gentles to get those test results.  Labs Review Labs Reviewed  COMPREHENSIVE METABOLIC PANEL  BRAIN NATRIURETIC PEPTIDE  TROPONIN I  CBC WITH DIFFERENTIAL/PLATELET    Imaging Review Dg Thoracic Spine 2 View  03/04/2014   CLINICAL DATA:  Fall off couch, now with midthoracic back pain.  EXAM: THORACIC SPINE - 2 VIEW  COMPARISON:  None.  FINDINGS: Minimal broad-based rightward scoliotic curvature. Mild disc space narrowing and endplate spurring in the mid thoracic spine. Vertebral body heights are maintained. There is no evidence of fracture. No paravertebral soft tissue abnormality.  IMPRESSION: Degenerative change in the thoracic spine without acute fracture.   Electronically Signed   By: Jeb Levering M.D.   On: 03/04/2014 06:42   Dg Lumbar Spine Complete  03/04/2014   CLINICAL DATA:  Fall off couch.  Lumbar spine back pain.  EXAM: LUMBAR SPINE - COMPLETE 4+ VIEW  COMPARISON:  None.  FINDINGS: Moderate scoliotic curvature of the lumbar spine. There is associated multilevel degenerative change. Diffuse disc space narrowing  and endplate sclerosis throughout. There are multilevel endplate spurs. Multilevel facet arthropathy. Vertebral body  heights are maintained. There is no acute fracture.  IMPRESSION: Moderate scoliotic curvature and associated multilevel degenerative change. No acute fracture.   Electronically Signed   By: Jeb Levering M.D.   On: 03/04/2014 06:40   Dg Sacrum/coccyx  03/04/2014   CLINICAL DATA:  Fall off couch, now with tailbone/sacral pain.  EXAM: SACRUM AND COCCYX - 2+ VIEW  COMPARISON:  None.  FINDINGS: There is no evidence of fracture or other focal bone lesions. Cortical margins of the sacrum and coccyx are intact. Sacral ala are maintained.  IMPRESSION: No evidence of sacral or coccygeal fracture.   Electronically Signed   By: Jeb Levering M.D.   On: 03/04/2014 06:41   Ct Head Wo Contrast  03/04/2014   CLINICAL DATA:  Fall off couch hitting forehead.  Now with headache.  EXAM: CT HEAD WITHOUT CONTRAST  TECHNIQUE: Contiguous axial images were obtained from the base of the skull through the vertex without intravenous contrast.  COMPARISON:  Head CT 08/18/2011  FINDINGS: No intracranial hemorrhage, mass effect, or midline shift. No hydrocephalus. The basilar cisterns are patent. No evidence of territorial infarct. No intracranial fluid collection. Focal encephalomalacia in the anterior right temporal and frontal lobes, unchanged from prior exam. Mild atrophy and chronic small vessel ischemic change. Calvarium is intact. Included paranasal sinuses and mastoid air cells are well aerated.  IMPRESSION: 1.  No acute intracranial abnormality. 2. Unchanged focal encephalomalacia right anterior temporal and frontal lobes. Mild atrophy and chronic small vessel ischemic change, stable.   Electronically Signed   By: Jeb Levering M.D.   On: 03/04/2014 06:46     EKG Interpretation None      MDM   Final diagnoses:  Fall at home, initial encounter  Upper back pain  Midline low back pain without  sciatica  Dyspnea    Disposition pending  Rolland Porter, MD, Abram Sander     Janice Norrie, MD 03/04/14 [phone]

## 2014-03-04 NOTE — ED Notes (Signed)
Pt ambulated with walker to door. Pt complains of SOB and dizziness. 02 sats low 90s%

## 2014-03-04 NOTE — ED Provider Notes (Signed)
D/w dr Lowanda Foster Recommends starting Sodium bicarb 650BID No other med changes  F/u as outpatient  Sharyon Cable, MD 03/04/14 [phone]

## 2014-03-04 NOTE — ED Provider Notes (Signed)
Pt talking on phone when I enter room, smiling and in no distress No hypoxia noted Lung sounds clear Imaging/labs pending   EKG Interpretation  Date/Time:  Saturday March 04 2014 07:37:20 EST Ventricular Rate:  80 PR Interval:  201 QRS Duration: 93 QT Interval:  387 QTC Calculation: 446 R Axis:   84 Text Interpretation:  Sinus rhythm Borderline right axis deviation Low voltage, extremity leads Baseline wander in lead(s) V2 artifact noted No previous ECGs available Confirmed by Christy Gentles  MD, Alexismarie Flaim ([id number]) on 03/04/2014 7:40:24 AM        Sharyon Cable, MD 03/04/14 [phone]

## 2014-05-04 ENCOUNTER — Encounter (HOSPITAL_COMMUNITY): Payer: Self-pay | Admitting: *Deleted

## 2014-05-04 ENCOUNTER — Emergency Department (HOSPITAL_COMMUNITY)
Admission: EM | Admit: 2014-05-04 | Discharge: 2014-05-04 | Disposition: A | Discharge status: Home / Self Care | Payer: Medicare Other | Attending: Emergency Medicine | Admitting: Emergency Medicine

## 2014-05-04 ENCOUNTER — Emergency Department (HOSPITAL_COMMUNITY): Payer: Medicare Other

## 2014-05-04 DIAGNOSIS — Y939 Activity, unspecified: Secondary | ICD-10-CM | POA: Insufficient documentation

## 2014-05-04 DIAGNOSIS — Z79899 Other long term (current) drug therapy: Secondary | ICD-10-CM | POA: Insufficient documentation

## 2014-05-04 DIAGNOSIS — Y999 Unspecified external cause status: Secondary | ICD-10-CM | POA: Insufficient documentation

## 2014-05-04 DIAGNOSIS — M1990 Unspecified osteoarthritis, unspecified site: Secondary | ICD-10-CM | POA: Diagnosis not present

## 2014-05-04 DIAGNOSIS — E119 Type 2 diabetes mellitus without complications: Secondary | ICD-10-CM | POA: Insufficient documentation

## 2014-05-04 DIAGNOSIS — R4182 Altered mental status, unspecified: Secondary | ICD-10-CM | POA: Insufficient documentation

## 2014-05-04 DIAGNOSIS — Z91040 Latex allergy status: Secondary | ICD-10-CM | POA: Insufficient documentation

## 2014-05-04 DIAGNOSIS — Y929 Unspecified place or not applicable: Secondary | ICD-10-CM | POA: Insufficient documentation

## 2014-05-04 DIAGNOSIS — Z853 Personal history of malignant neoplasm of breast: Secondary | ICD-10-CM | POA: Insufficient documentation

## 2014-05-04 DIAGNOSIS — I10 Essential (primary) hypertension: Secondary | ICD-10-CM | POA: Diagnosis not present

## 2014-05-04 DIAGNOSIS — X58XXXA Exposure to other specified factors, initial encounter: Secondary | ICD-10-CM | POA: Insufficient documentation

## 2014-05-04 DIAGNOSIS — R531 Weakness: Secondary | ICD-10-CM | POA: Diagnosis present

## 2014-05-04 DIAGNOSIS — Z87448 Personal history of other diseases of urinary system: Secondary | ICD-10-CM | POA: Insufficient documentation

## 2014-05-04 DIAGNOSIS — S40012A Contusion of left shoulder, initial encounter: Secondary | ICD-10-CM | POA: Diagnosis not present

## 2014-05-04 DIAGNOSIS — I509 Heart failure, unspecified: Secondary | ICD-10-CM | POA: Diagnosis not present

## 2014-05-04 LAB — RAPID URINE DRUG SCREEN, HOSP PERFORMED
Amphetamines: NOT DETECTED
BENZODIAZEPINES: NOT DETECTED
Barbiturates: NOT DETECTED
Cocaine: NOT DETECTED
OPIATES: POSITIVE — AB
Tetrahydrocannabinol: NOT DETECTED

## 2014-05-04 LAB — URINALYSIS, ROUTINE W REFLEX MICROSCOPIC
Bilirubin Urine: NEGATIVE
Glucose, UA: NEGATIVE mg/dL
Hgb urine dipstick: NEGATIVE
Ketones, ur: NEGATIVE mg/dL
LEUKOCYTES UA: NEGATIVE
NITRITE: NEGATIVE
PH: 5 (ref 5.0–8.0)
PROTEIN: NEGATIVE mg/dL
Specific Gravity, Urine: 1.015 (ref 1.005–1.030)
Urobilinogen, UA: 0.2 mg/dL (ref 0.0–1.0)

## 2014-05-04 LAB — CBC WITH DIFFERENTIAL/PLATELET
BASOS ABS: 0 10*3/uL (ref 0.0–0.1)
Basophils Relative: 0 % (ref 0–1)
EOS ABS: 0.2 10*3/uL (ref 0.0–0.7)
Eosinophils Relative: 3 % (ref 0–5)
HEMATOCRIT: 30.1 % — AB (ref 36.0–46.0)
HEMOGLOBIN: 9.4 g/dL — AB (ref 12.0–15.0)
Lymphocytes Relative: 22 % (ref 12–46)
Lymphs Abs: 1.6 10*3/uL (ref 0.7–4.0)
MCH: 33.7 pg (ref 26.0–34.0)
MCHC: 31.2 g/dL (ref 30.0–36.0)
MCV: 107.9 fL — ABNORMAL HIGH (ref 78.0–100.0)
Monocytes Absolute: 0.6 10*3/uL (ref 0.1–1.0)
Monocytes Relative: 8 % (ref 3–12)
Neutro Abs: 4.7 10*3/uL (ref 1.7–7.7)
Neutrophils Relative %: 67 % (ref 43–77)
PLATELETS: 154 10*3/uL (ref 150–400)
RBC: 2.79 MIL/uL — AB (ref 3.87–5.11)
RDW: 15.4 % (ref 11.5–15.5)
WBC: 7 10*3/uL (ref 4.0–10.5)

## 2014-05-04 LAB — COMPREHENSIVE METABOLIC PANEL
ALT: 14 U/L (ref 0–35)
ANION GAP: 10 (ref 5–15)
AST: 31 U/L (ref 0–37)
Albumin: 4.7 g/dL (ref 3.5–5.2)
Alkaline Phosphatase: 46 U/L (ref 39–117)
BUN: 39 mg/dL — AB (ref 6–23)
CO2: 22 mmol/L (ref 19–32)
Calcium: 10 mg/dL (ref 8.4–10.5)
Chloride: 103 mmol/L (ref 96–112)
Creatinine, Ser: 2.32 mg/dL — ABNORMAL HIGH (ref 0.50–1.10)
GFR calc non Af Amer: 21 mL/min — ABNORMAL LOW (ref >90)
GFR, EST AFRICAN AMERICAN: 24 mL/min — AB (ref >90)
GLUCOSE: 81 mg/dL (ref 70–99)
Potassium: 4.7 mmol/L (ref 3.5–5.1)
Sodium: 135 mmol/L (ref 135–145)
TOTAL PROTEIN: 7.7 g/dL (ref 6.0–8.3)
Total Bilirubin: 0.5 mg/dL (ref 0.3–1.2)

## 2014-05-04 LAB — AMMONIA: Ammonia: 38 umol/L — ABNORMAL HIGH (ref 11–32)

## 2014-05-04 LAB — TROPONIN I: TROPONIN I: 0.03 ng/mL (ref <0.031)

## 2014-05-04 LAB — ETHANOL: Alcohol, Ethyl (B): <5 mg/dL (ref 0–9)

## 2014-05-04 NOTE — ED Provider Notes (Signed)
CSN: [id number]     Arrival date & time 05/04/14  1514 History   First MD Initiated Contact with Patient 05/04/14 1524     Chief Complaint  Patient presents with  . Weakness    Level V caveat: Altered mental status  HPI Patient presents the emergency department with generalized weakness over the past month.  She has difficulty staying awake during the history and seems to be drowsy.  She denies alcohol or drug abuse.  She is on oxycodone for pain.  She denies chest pain shortness breath.  Denies abdominal pain.  She states she called the paramedics because she "did not feel good".  She cannot further explain this sensation.  Past Medical History  Diagnosis Date  . Kidney disease   . Hypertension   . Diabetes mellitus   . 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 Arthritis   . CHF (congestive heart failure)   . Lymphedema of leg   . Anxiety    Past Surgical History  Procedure Laterality Date  . Breast surgery    . Cholecystectomy    . Abdominal hysterectomy    . Replacement total knee bilateral    . Hand tendon surgery     History reviewed. No pertinent family history. History  Substance Use Topics  . Smoking status: Never Smoker   . Smokeless tobacco: Never Used  . Alcohol Use: No   OB History    No data available     Review of Systems  Unable to perform ROS     Allergies  Mometasone; Vancomycin; Aspirin; Contrast media; Cortisone; Doxycycline; Ibuprofen; Insulins; Lasix; Other; Prednisone; Sulfamethoxazole; Tape; Ultram; Codeine; Dilantin; Latex; and Zosyn  Home Medications   Prior to Admission medications   Medication Sig Start Date End Date Taking? Authorizing Provider  bumetanide (BUMEX) 1 MG tablet Take 1 mg by mouth 2 (two) times daily. 12/14/13  Yes Historical Provider, MD  cetirizine (ZYRTEC) 10 MG tablet Take 5 mg by mouth daily as needed for allergies.   Yes Historical Provider, MD  citalopram (CELEXA) 10 MG tablet Take 1 tablet by mouth daily. 05/01/14  Yes  Historical Provider, MD  clonazePAM (KLONOPIN) 0.5 MG tablet Take 1 tablet by mouth twice daily x 30 days. (use for Klonopin) Patient taking differently: Take 0.5 mg by mouth 2 (two) times daily.  10/08/12  Yes [name], DO  docusate sodium (COLACE) 100 MG capsule Take 100 mg by mouth 2 (two) times daily.   Yes Historical Provider, MD  esomeprazole (NEXIUM) 40 MG capsule Take 40 mg by mouth daily before breakfast.     Yes Historical Provider, MD  exemestane (AROMASIN) 25 MG tablet Take 25 mg by mouth daily.     Yes Historical Provider, MD  fluticasone (FLONASE) 50 MCG/ACT nasal spray Place 1 spray into both nostrils daily as needed for allergies.  03/24/14  Yes Historical Provider, MD  folic acid (FOLVITE) 1 MG tablet Take 1 mg by mouth daily.   Yes Historical Provider, MD  lisinopril (PRINIVIL,ZESTRIL) 2.5 MG tablet Take 1 tablet by mouth daily. 05/01/14  Yes Historical Provider, MD  LYRICA 75 MG capsule Take 75 mg by mouth 3 (three) times daily. 12/14/13  Yes Historical Provider, MD  magnesium oxide (MAG-OX) 400 MG tablet Take 400 mg by mouth daily.   Yes Historical Provider, MD  Melatonin 3 MG CAPS Take 3 mg by mouth at bedtime.   Yes Historical Provider, MD  metoprolol (TOPROL-XL) 200  MG 24 hr tablet Take 200 mg by mouth daily. Takes with a 50 mg tablet to total 250 mg daily   Yes Historical Provider, MD  metoprolol succinate (TOPROL-XL) 50 MG 24 hr tablet Take 50 mg by mouth daily. Take with Toprol XL 263m for a total of 2560m  Yes Historical Provider, MD  montelukast (SINGULAIR) 10 MG tablet Take 10 mg by mouth at bedtime.     Yes Historical Provider, MD  Multiple Vitamins-Minerals (MULTIVITAMINS THER. W/MINERALS) TABS Take 1 tablet by mouth daily.     Yes Historical Provider, MD  oxyCODONE-acetaminophen (PERCOCET) 10-325 MG per tablet Take 1 tablet by mouth every 6 (six) hours as needed for pain. Patient taking differently: Take 1 tablet by mouth every 4 (four) hours as needed for pain.   11/03/13  Yes RoCarmin MuskratMD  polyethylene glycol ([facility] GLYCOLAX) packet Take 17 g by mouth daily as needed (constipation).    Yes Historical Provider, MD  PROAIR HFA 108 (90 BASE) MCG/ACT inhaler Take 2 puffs by mouth every 6 (six) hours as needed. 04/11/14  Yes Historical Provider, MD  vitamin B-12 (CYANOCOBALAMIN) 100 MCG tablet Take 100 mcg by mouth daily.   Yes Historical Provider, MD  vitamin C (ASCORBIC ACID) 500 MG tablet Take 500 mg by mouth daily.   Yes Historical Provider, MD  vitamin E 400 UNIT capsule Take 400 Units by mouth daily.   Yes Historical Provider, MD  sodium bicarbonate 650 MG tablet Take 1 tablet (650 mg total) by mouth 2 (two) times daily. Patient not taking: Reported on 05/04/2014 03/04/14   DoRipley FraiseMD   BP 99/51 mmHg  Pulse 81  Temp(Src) 97.9 F (36.6 C) (Oral)  Resp 15  SpO2 92% Physical Exam  Constitutional: She appears well-developed and well-nourished. No distress.  HENT:  Head: Normocephalic and atraumatic.  Eyes: EOM are normal.  Neck: Normal range of motion.  Cardiovascular: Normal rate, regular rhythm and normal heart sounds.   Pulmonary/Chest: Effort normal and breath sounds normal.  Abdominal: Soft. She exhibits no distension. There is no tenderness.  Musculoskeletal: Normal range of motion.  Small bruise anterior left shoulder with normal ROM of left shoulder. No deformity.   Neurological:  Answers very simple questions and follows simple commands.  No obvious focal neurologic deficit or focal weakness of her arms or legs.  Skin: Skin is warm and dry.  Psychiatric: She has a normal mood and affect. Judgment normal.  Nursing note and vitals reviewed.   ED Course  Procedures (including critical care time) Labs Review Labs Reviewed  CBC WITH DIFFERENTIAL/PLATELET - Abnormal; Notable for the following:    RBC 2.79 (*)    Hemoglobin 9.4 (*)    HCT 30.1 (*)    MCV 107.9 (*)    All other components within normal limits   COMPREHENSIVE METABOLIC PANEL - Abnormal; Notable for the following:    BUN 39 (*)    Creatinine, Ser 2.32 (*)    GFR calc non Af Amer 21 (*)    GFR calc Af Amer 24 (*)    All other components within normal limits  AMMONIA - Abnormal; Notable for the following:    Ammonia 38 (*)    All other components within normal limits  URINE RAPID DRUG SCREEN (HOSP PERFORMED) - Abnormal; Notable for the following:    Opiates POSITIVE (*)    All other components within normal limits  ETHANOL  URINALYSIS, ROUTINE W REFLEX MICROSCOPIC   BUN  Date Value Ref Range Status  05/04/2014 39* 6 - 23 mg/dL Final  03/04/2014 53* 6 - 23 mg/dL Final  01/20/2014 72* 6 - 23 mg/dL Final  11/03/2013 55* 6 - 23 mg/dL Final   CREATININE, SER  Date Value Ref Range Status  05/04/2014 2.32* 0.50 - 1.10 mg/dL Final  03/04/2014 2.99* 0.50 - 1.10 mg/dL Final  01/20/2014 2.94* 0.50 - 1.10 mg/dL Final  11/03/2013 1.80* 0.50 - 1.10 mg/dL Final   HEMOGLOBIN  Date Value Ref Range Status  05/04/2014 9.4* 12.0 - 15.0 g/dL Final  03/04/2014 10.2* 12.0 - 15.0 g/dL Final  01/20/2014 9.5* 12.0 - 15.0 g/dL Final  11/03/2013 9.9* 12.0 - 15.0 g/dL Final       Imaging Review Ct Head Wo Contrast  05/04/2014   CLINICAL DATA:  Weakness and altered level of consciousness. Recent fall  EXAM: CT HEAD WITHOUT CONTRAST  TECHNIQUE: Contiguous axial images were obtained from the base of the skull through the vertex without intravenous contrast.  COMPARISON:  03/04/2014  FINDINGS: Skull and Sinuses:No fracture or destructive process. 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 Clear mastoids and paranasal sinuses.  Orbits: Mild proptosis which is chronic and likely from increase in retro-orbital fat. Bilateral cataract resection. No traumatic findings.  Brain: No evidence of acute infarction, hemorrhage, hydrocephalus, or mass lesion/mass effect. Gliosis and encephalomalacia in the right more than left  inferior frontal lobes and in the right temporal pole. The pattern suggest remote contusions.  IMPRESSION: 1. No acute intracranial findings. 2. Bifrontal and right temporal gliosis, likely posttraumatic.   Electronically Signed   By: Monte Fantasia M.D.   On: 05/04/2014 17:30     EKG Interpretation   Date/Time:  Thursday May 04 2014 15:29:26 EDT Ventricular Rate:  81 PR Interval:  189 QRS Duration: 89 QT Interval:  400 QTC Calculation: 464 R Axis:   69 Text Interpretation:  Sinus rhythm Borderline low voltage, extremity leads  Abnormal T, consider ischemia, anterior leads Baseline wander in lead(s)  V4 anterior T wave changes as compared to prior Confirmed by Myria Steenbergen  MD,  Enas Winchel ([id number]) on 05/04/2014 4:47:13 PM      MDM   Final diagnoses:  Altered mental status, unspecified altered mental status type    Patient's mental status significantly improved while in the emergency department.  She is now ambulatory in the ER.  I personally ambulated her.  She does have some balance issues at baseline and uses a walker.  I recommended that she use her walker.  I suspect this may be polypharmacy and/or too much of her pain medication.  I recommended that she talk with her physician about this.  Given her long-standing history of chronic pain I do not want to make any changes myself.  Discharge home in good condition.  Primary care follow-up.    Jola Schmidt, MD 05/04/14 2017

## 2014-05-04 NOTE — ED Notes (Signed)
Patient lying on stretcher, arouses easily to verbal stimuli.  Patient awaiting disposition.

## 2014-05-04 NOTE — ED Notes (Signed)
Patient ambulatory with assistance of [place], [place].  Patient went to turn in hall and fell; patient was assisted to floor by [place], NT.  Abrasion noted to left knee.  Dr. Venora Maples assessed patient and patient to be discharged.

## 2014-05-04 NOTE — Discharge Instructions (Signed)
Confusion Confusion is the inability to think with your usual speed or clarity. Confusion may come on quickly or slowly over time. How quickly the confusion comes on depends on the cause. Confusion can be due to any number of causes. CAUSES   Concussion, head injury, or head trauma.  Seizures.  Stroke.  Fever.  Brain tumor.  Age related decreased brain function (dementia).  Heightened emotional states like rage or terror.  Mental illness in which the person loses the ability to determine what is real and what is not (hallucinations).  Infections such as a urinary tract infection (UTI).  Toxic effects from alcohol, drugs, or prescription medicines.  Dehydration and an imbalance of salts in the body (electrolytes).  Lack of sleep.  Low blood sugar (diabetes).  Low levels of oxygen from conditions such as chronic lung disorders.  Drug interactions or other medicine side effects.  Nutritional deficiencies, especially niacin, thiamine, vitamin C, or vitamin B.  Sudden drop in body temperature (hypothermia).  Change in routine, such as when traveling or hospitalized. SIGNS AND SYMPTOMS  People often describe their thinking as cloudy or unclear when they are confused. Confusion can also include feeling disoriented. That means you are unaware of where or who you are. You may also not know what the date or time is. If confused, you may also have difficulty paying attention, remembering, and making decisions. Some people also act aggressively when they are confused.  DIAGNOSIS  The medical evaluation of confusion may include:  Blood and urine tests.  X-rays.  Brain and nervous system tests.  Analyzing your brain waves (electroencephalogram or EEG).  Magnetic resonance imaging (MRI) of your head.  Computed tomography (CT) scan of your head.  Mental status tests in which your health care provider may ask many questions. Some of these questions may seem silly or strange,  but they are a very important test to help diagnose and treat confusion. TREATMENT  An admission to the hospital may not be needed, but a person with confusion should not be left alone. Stay with a family member or friend until the confusion clears. Avoid alcohol, pain relievers, or sedative drugs until you have fully recovered. Do not drive until directed by your health care provider. HOME CARE INSTRUCTIONS  What family and friends can do:  To find out if someone is confused, ask the person to state his or her name, age, and the date. If the person is unsure or answers incorrectly, he or she is confused.  Always introduce yourself, no matter how well the person knows you.  Often remind the person of his or her location.  Place a calendar and clock near the confused person.  Help the person with his or her medicines. You may want to use a pill box, an alarm as a reminder, or give the person each dose as prescribed.  Talk about current events and plans for the day.  Try to keep the environment calm, quiet, and peaceful.  Make sure the person keeps follow-up visits with his or her health care provider. PREVENTION  Ways to prevent confusion:  Avoid alcohol.  Eat a balanced diet.  Get enough sleep.  Take medicine only as directed by your health care provider.  Do not become isolated. Spend time with other people and make plans for your days.  Keep careful watch on your blood sugar levels if you are diabetic. SEEK IMMEDIATE MEDICAL CARE IF:   You develop severe headaches, repeated vomiting, seizures, blackouts, or   slurred speech.  There is increasing confusion, weakness, numbness, restlessness, or personality changes.  You develop a loss of balance, have marked dizziness, feel uncoordinated, or fall.  You have delusions, hallucinations, or develop severe anxiety.  Your family members think you need to be rechecked. Document Released: 01/31/2004 Document Revised: 05/09/2013  Document Reviewed: 01/28/2013 ExitCare Patient Information 2015 [employer]. This information is not intended to replace advice given to you by your health care provider. Make sure you discuss any questions you have with your health care provider.  

## 2014-05-04 NOTE — ED Notes (Signed)
Pt with c/o generalized weakness x 1 month. Very drowsy, will fall asleep during triage questions and assessment. Responds to voice and will eventually answer questions. Bruising to left anterior shoulder, patient not able to recall the mechanism of injury. She states she "either fell and hit it or ran into something". No deformity noted.

## 2014-05-04 NOTE — ED Notes (Signed)
Patient has reported generalized weakness and lethargy since 0930 this morning. Reportedly takes 20mg  Oxycodone for DJD. Hx TBI.

## 2014-08-21 ENCOUNTER — Emergency Department (HOSPITAL_COMMUNITY): Payer: Medicare Other

## 2014-08-21 ENCOUNTER — Inpatient Hospital Stay (HOSPITAL_COMMUNITY)
Admission: EM | Admit: 2014-08-21 | Discharge: 2014-08-27 | DRG: 871 | Disposition: A | Discharge status: Home Health | Payer: Medicare Other | Attending: Family Medicine | Admitting: Family Medicine

## 2014-08-21 ENCOUNTER — Encounter (HOSPITAL_COMMUNITY): Payer: Self-pay | Admitting: *Deleted

## 2014-08-21 ENCOUNTER — Inpatient Hospital Stay (HOSPITAL_COMMUNITY): Payer: Medicare Other

## 2014-08-21 DIAGNOSIS — Z923 Personal history of irradiation: Secondary | ICD-10-CM

## 2014-08-21 DIAGNOSIS — E1122 Type 2 diabetes mellitus with diabetic chronic kidney disease: Secondary | ICD-10-CM | POA: Diagnosis not present

## 2014-08-21 DIAGNOSIS — C50919 Malignant neoplasm of unspecified site of unspecified female breast: Secondary | ICD-10-CM | POA: Diagnosis not present

## 2014-08-21 DIAGNOSIS — Z9221 Personal history of antineoplastic chemotherapy: Secondary | ICD-10-CM | POA: Diagnosis not present

## 2014-08-21 DIAGNOSIS — Z8614 Personal history of Methicillin resistant Staphylococcus aureus infection: Secondary | ICD-10-CM

## 2014-08-21 DIAGNOSIS — J984 Other disorders of lung: Secondary | ICD-10-CM | POA: Diagnosis present

## 2014-08-21 DIAGNOSIS — N179 Acute kidney failure, unspecified: Secondary | ICD-10-CM | POA: Insufficient documentation

## 2014-08-21 DIAGNOSIS — N184 Chronic kidney disease, stage 4 (severe): Secondary | ICD-10-CM | POA: Diagnosis present

## 2014-08-21 DIAGNOSIS — C50912 Malignant neoplasm of unspecified site of left female breast: Secondary | ICD-10-CM

## 2014-08-21 DIAGNOSIS — D631 Anemia in chronic kidney disease: Secondary | ICD-10-CM | POA: Diagnosis present

## 2014-08-21 DIAGNOSIS — R0600 Dyspnea, unspecified: Secondary | ICD-10-CM

## 2014-08-21 DIAGNOSIS — I5020 Unspecified systolic (congestive) heart failure: Secondary | ICD-10-CM | POA: Diagnosis present

## 2014-08-21 DIAGNOSIS — R509 Fever, unspecified: Secondary | ICD-10-CM

## 2014-08-21 DIAGNOSIS — D6959 Other secondary thrombocytopenia: Secondary | ICD-10-CM | POA: Diagnosis present

## 2014-08-21 DIAGNOSIS — R6521 Severe sepsis with septic shock: Secondary | ICD-10-CM | POA: Diagnosis present

## 2014-08-21 DIAGNOSIS — I9589 Other hypotension: Secondary | ICD-10-CM

## 2014-08-21 DIAGNOSIS — X58XXXA Exposure to other specified factors, initial encounter: Secondary | ICD-10-CM | POA: Diagnosis present

## 2014-08-21 DIAGNOSIS — I272 Other secondary pulmonary hypertension: Secondary | ICD-10-CM | POA: Diagnosis present

## 2014-08-21 DIAGNOSIS — Z853 Personal history of malignant neoplasm of breast: Secondary | ICD-10-CM | POA: Diagnosis not present

## 2014-08-21 DIAGNOSIS — S2249XA Multiple fractures of ribs, unspecified side, initial encounter for closed fracture: Secondary | ICD-10-CM | POA: Insufficient documentation

## 2014-08-21 DIAGNOSIS — R0789 Other chest pain: Secondary | ICD-10-CM

## 2014-08-21 DIAGNOSIS — I509 Heart failure, unspecified: Secondary | ICD-10-CM | POA: Diagnosis not present

## 2014-08-21 DIAGNOSIS — S2232XA Fracture of one rib, left side, initial encounter for closed fracture: Secondary | ICD-10-CM | POA: Diagnosis present

## 2014-08-21 DIAGNOSIS — G8929 Other chronic pain: Secondary | ICD-10-CM | POA: Diagnosis present

## 2014-08-21 DIAGNOSIS — I50810 Right heart failure, unspecified: Secondary | ICD-10-CM

## 2014-08-21 DIAGNOSIS — R7989 Other specified abnormal findings of blood chemistry: Secondary | ICD-10-CM

## 2014-08-21 DIAGNOSIS — Z881 Allergy status to other antibiotic agents status: Secondary | ICD-10-CM

## 2014-08-21 DIAGNOSIS — A419 Sepsis, unspecified organism: Principal | ICD-10-CM | POA: Diagnosis present

## 2014-08-21 DIAGNOSIS — D539 Nutritional anemia, unspecified: Secondary | ICD-10-CM | POA: Diagnosis present

## 2014-08-21 DIAGNOSIS — I129 Hypertensive chronic kidney disease with stage 1 through stage 4 chronic kidney disease, or unspecified chronic kidney disease: Secondary | ICD-10-CM | POA: Diagnosis present

## 2014-08-21 DIAGNOSIS — D696 Thrombocytopenia, unspecified: Secondary | ICD-10-CM | POA: Diagnosis not present

## 2014-08-21 DIAGNOSIS — E872 Acidosis: Secondary | ICD-10-CM

## 2014-08-21 DIAGNOSIS — I959 Hypotension, unspecified: Secondary | ICD-10-CM | POA: Diagnosis present

## 2014-08-21 DIAGNOSIS — S2242XA Multiple fractures of ribs, left side, initial encounter for closed fracture: Secondary | ICD-10-CM

## 2014-08-21 DIAGNOSIS — N289 Disorder of kidney and ureter, unspecified: Secondary | ICD-10-CM

## 2014-08-21 DIAGNOSIS — C7951 Secondary malignant neoplasm of bone: Secondary | ICD-10-CM | POA: Diagnosis present

## 2014-08-21 DIAGNOSIS — R799 Abnormal finding of blood chemistry, unspecified: Secondary | ICD-10-CM

## 2014-08-21 HISTORY — DX: Anemia, unspecified: D64.9

## 2014-08-21 HISTORY — DX: Peptic ulcer, site unspecified, unspecified as acute or chronic, without hemorrhage or perforation: K27.9

## 2014-08-21 HISTORY — DX: Major depressive disorder, single episode, unspecified: F32.9

## 2014-08-21 HISTORY — DX: Carrier or suspected carrier of methicillin resistant Staphylococcus aureus: Z22.322

## 2014-08-21 HISTORY — DX: Gastro-esophageal reflux disease without esophagitis: K21.9

## 2014-08-21 HISTORY — DX: Depression, unspecified: F32.A

## 2014-08-21 HISTORY — DX: Nonrheumatic pulmonary valve insufficiency: I37.1

## 2014-08-21 HISTORY — DX: Acute pancreatitis without necrosis or infection, unspecified: K85.90

## 2014-08-21 HISTORY — DX: Zoster without complications: B02.9

## 2014-08-21 HISTORY — DX: Pulmonary hypertension, unspecified: I27.20

## 2014-08-21 LAB — COMPREHENSIVE METABOLIC PANEL
ALK PHOS: 60 U/L (ref 38–126)
ALT: 13 U/L — ABNORMAL LOW (ref 14–54)
ANION GAP: 14 (ref 5–15)
AST: 29 U/L (ref 15–41)
Albumin: 4.3 g/dL (ref 3.5–5.0)
BUN: 57 mg/dL — ABNORMAL HIGH (ref 6–20)
CO2: 20 mmol/L — AB (ref 22–32)
Calcium: 10.1 mg/dL (ref 8.9–10.3)
Chloride: 103 mmol/L (ref 101–111)
Creatinine, Ser: 2.15 mg/dL — ABNORMAL HIGH (ref 0.44–1.00)
GFR calc non Af Amer: 23 mL/min — ABNORMAL LOW (ref >60)
GFR, EST AFRICAN AMERICAN: 26 mL/min — AB (ref >60)
Glucose, Bld: 72 mg/dL (ref 65–99)
Potassium: 4.7 mmol/L (ref 3.5–5.1)
SODIUM: 137 mmol/L (ref 135–145)
Total Bilirubin: 0.5 mg/dL (ref 0.3–1.2)
Total Protein: 7.8 g/dL (ref 6.5–8.1)

## 2014-08-21 LAB — CBC
HEMATOCRIT: 26.4 % — AB (ref 36.0–46.0)
HEMOGLOBIN: 8.4 g/dL — AB (ref 12.0–15.0)
MCH: 33.6 pg (ref 26.0–34.0)
MCHC: 31.8 g/dL (ref 30.0–36.0)
MCV: 105.6 fL — AB (ref 78.0–100.0)
Platelets: 96 10*3/uL — ABNORMAL LOW (ref 150–400)
RBC: 2.5 MIL/uL — AB (ref 3.87–5.11)
RDW: 17.7 % — ABNORMAL HIGH (ref 11.5–15.5)
WBC: 6 10*3/uL (ref 4.0–10.5)

## 2014-08-21 LAB — URINALYSIS, ROUTINE W REFLEX MICROSCOPIC
Bilirubin Urine: NEGATIVE
GLUCOSE, UA: NEGATIVE mg/dL
Hgb urine dipstick: NEGATIVE
KETONES UR: NEGATIVE mg/dL
LEUKOCYTES UA: NEGATIVE
Nitrite: NEGATIVE
PH: 5.5 (ref 5.0–8.0)
Protein, ur: NEGATIVE mg/dL
Specific Gravity, Urine: 1.01 (ref 1.005–1.030)
Urobilinogen, UA: 0.2 mg/dL (ref 0.0–1.0)

## 2014-08-21 LAB — PROCALCITONIN: Procalcitonin: 32.65 ng/mL

## 2014-08-21 LAB — CBC WITH DIFFERENTIAL/PLATELET
Basophils Absolute: 0 10*3/uL (ref 0.0–0.1)
Basophils Relative: 0 % (ref 0–1)
EOS ABS: 0 10*3/uL (ref 0.0–0.7)
Eosinophils Relative: 0 % (ref 0–5)
HCT: 31.6 % — ABNORMAL LOW (ref 36.0–46.0)
HEMOGLOBIN: 9.9 g/dL — AB (ref 12.0–15.0)
LYMPHS ABS: 0.5 10*3/uL — AB (ref 0.7–4.0)
LYMPHS PCT: 11 % — AB (ref 12–46)
MCH: 33.2 pg (ref 26.0–34.0)
MCHC: 31.3 g/dL (ref 30.0–36.0)
MCV: 106 fL — AB (ref 78.0–100.0)
Monocytes Absolute: 0.1 10*3/uL (ref 0.1–1.0)
Monocytes Relative: 2 % — ABNORMAL LOW (ref 3–12)
NEUTROS ABS: 4 10*3/uL (ref 1.7–7.7)
NEUTROS PCT: 87 % — AB (ref 43–77)
Platelets: 129 10*3/uL — ABNORMAL LOW (ref 150–400)
RBC: 2.98 MIL/uL — AB (ref 3.87–5.11)
RDW: 17.6 % — ABNORMAL HIGH (ref 11.5–15.5)
WBC: 4.6 10*3/uL (ref 4.0–10.5)

## 2014-08-21 LAB — PROTIME-INR
INR: 1.45 (ref 0.00–1.49)
Prothrombin Time: 17.7 seconds — ABNORMAL HIGH (ref 11.6–15.2)

## 2014-08-21 LAB — BRAIN NATRIURETIC PEPTIDE: B Natriuretic Peptide: 1557 pg/mL — ABNORMAL HIGH (ref 0.0–100.0)

## 2014-08-21 LAB — BASIC METABOLIC PANEL
ANION GAP: 11 (ref 5–15)
BUN: 54 mg/dL — AB (ref 6–20)
CHLORIDE: 108 mmol/L (ref 101–111)
CO2: 17 mmol/L — ABNORMAL LOW (ref 22–32)
Calcium: 8.5 mg/dL — ABNORMAL LOW (ref 8.9–10.3)
Creatinine, Ser: 2.06 mg/dL — ABNORMAL HIGH (ref 0.44–1.00)
GFR calc non Af Amer: 24 mL/min — ABNORMAL LOW (ref >60)
GFR, EST AFRICAN AMERICAN: 28 mL/min — AB (ref >60)
Glucose, Bld: 69 mg/dL (ref 65–99)
POTASSIUM: 4.7 mmol/L (ref 3.5–5.1)
SODIUM: 136 mmol/L (ref 135–145)

## 2014-08-21 LAB — LACTIC ACID, PLASMA
Lactic Acid, Venous: 1.5 mmol/L (ref 0.5–2.0)
Lactic Acid, Venous: 1.3 mmol/L (ref 0.5–2.0)

## 2014-08-21 LAB — I-STAT CG4 LACTIC ACID, ED
Lactic Acid, Venous: 4.52 mmol/L (ref 0.5–2.0)
Lactic Acid, Venous: 3.11 mmol/L (ref 0.5–2.0)

## 2014-08-21 LAB — MRSA PCR SCREENING: MRSA by PCR: NEGATIVE

## 2014-08-21 LAB — APTT: APTT: 36 s (ref 24–37)

## 2014-08-21 LAB — TROPONIN I: Troponin I: <0.03 ng/mL (ref <0.031)

## 2014-08-21 MED ORDER — DOCUSATE SODIUM 100 MG PO CAPS
100.0000 mg | ORAL_CAPSULE | Freq: Two times a day (BID) | ORAL | Status: DC
  Administered 2014-08-21 – 2014-08-26 (×11): 100 mg via ORAL
  Filled 2014-08-21 (×11): qty 1

## 2014-08-21 MED ORDER — SODIUM CHLORIDE 0.9 % IV SOLN
INTRAVENOUS | Status: AC
  Administered 2014-08-21: 100 mL/h via INTRAVENOUS

## 2014-08-21 MED ORDER — ALUM & MAG HYDROXIDE-SIMETH 200-200-20 MG/5ML PO SUSP: 30.0000 mL | Freq: Four times a day (QID) | ORAL | Status: DC | PRN

## 2014-08-21 MED ORDER — HYDROMORPHONE HCL 1 MG/ML IJ SOLN: 0.5000 mg | INTRAMUSCULAR | Status: DC | PRN

## 2014-08-21 MED ORDER — SODIUM CHLORIDE 0.9 % IJ SOLN
3.0000 mL | Freq: Two times a day (BID) | INTRAMUSCULAR | Status: DC
  Administered 2014-08-21 – 2014-08-27 (×10): 3 mL via INTRAVENOUS

## 2014-08-21 MED ORDER — EXEMESTANE 25 MG PO TABS
25.0000 mg | ORAL_TABLET | Freq: Every day | ORAL | Status: DC
  Administered 2014-08-21 – 2014-08-27 (×7): 25 mg via ORAL
  Filled 2014-08-21 (×8): qty 1

## 2014-08-21 MED ORDER — PREGABALIN 75 MG PO CAPS
75.0000 mg | ORAL_CAPSULE | Freq: Three times a day (TID) | ORAL | Status: DC
  Administered 2014-08-21 – 2014-08-22 (×6): 75 mg via ORAL
  Filled 2014-08-21 (×6): qty 1

## 2014-08-21 MED ORDER — SODIUM BICARBONATE 650 MG PO TABS
650.0000 mg | ORAL_TABLET | Freq: Two times a day (BID) | ORAL | Status: DC
  Administered 2014-08-21 – 2014-08-27 (×13): 650 mg via ORAL
  Filled 2014-08-21 (×13): qty 1

## 2014-08-21 MED ORDER — METOPROLOL SUCCINATE ER 50 MG PO TB24
50.0000 mg | ORAL_TABLET | Freq: Every day | ORAL | Status: DC
  Filled 2014-08-21: qty 1

## 2014-08-21 MED ORDER — ACETAMINOPHEN 325 MG PO TABS
650.0000 mg | ORAL_TABLET | Freq: Four times a day (QID) | ORAL | Status: DC | PRN
  Administered 2014-08-22 – 2014-08-25 (×4): 650 mg via ORAL
  Filled 2014-08-21 (×5): qty 2

## 2014-08-21 MED ORDER — METOPROLOL SUCCINATE ER 50 MG PO TB24
200.0000 mg | ORAL_TABLET | Freq: Every day | ORAL | Status: DC
  Filled 2014-08-21: qty 4

## 2014-08-21 MED ORDER — ACETAMINOPHEN 325 MG PO TABS
650.0000 mg | ORAL_TABLET | Freq: Once | ORAL | Status: AC
Start: 2014-08-21 — End: 2014-08-21
  Administered 2014-08-21: 650 mg via ORAL
  Filled 2014-08-21: qty 2

## 2014-08-21 MED ORDER — ACETAMINOPHEN 650 MG RE SUPP: 650.0000 mg | Freq: Four times a day (QID) | RECTAL | Status: DC | PRN

## 2014-08-21 MED ORDER — LINEZOLID 600 MG PO TABS
600.0000 mg | ORAL_TABLET | Freq: Two times a day (BID) | ORAL | Status: DC
  Administered 2014-08-21 – 2014-08-27 (×13): 600 mg via ORAL
  Filled 2014-08-21 (×15): qty 1

## 2014-08-21 MED ORDER — LISINOPRIL 5 MG PO TABS
2.5000 mg | ORAL_TABLET | Freq: Every day | ORAL | Status: DC
  Filled 2014-08-21: qty 1

## 2014-08-21 MED ORDER — PANTOPRAZOLE SODIUM 40 MG PO TBEC
40.0000 mg | DELAYED_RELEASE_TABLET | Freq: Every day | ORAL | Status: DC
  Administered 2014-08-21 – 2014-08-27 (×7): 40 mg via ORAL
  Filled 2014-08-21 (×7): qty 1

## 2014-08-21 MED ORDER — CITALOPRAM HYDROBROMIDE 20 MG PO TABS
10.0000 mg | ORAL_TABLET | Freq: Every day | ORAL | Status: DC
  Administered 2014-08-21 – 2014-08-22 (×2): 10 mg via ORAL
  Filled 2014-08-21 (×2): qty 1

## 2014-08-21 MED ORDER — VANCOMYCIN HCL IN DEXTROSE 1-5 GM/200ML-% IV SOLN
1000.0000 mg | Freq: Once | INTRAVENOUS | Status: DC
  Filled 2014-08-21: qty 200

## 2014-08-21 MED ORDER — LEVOFLOXACIN IN D5W 750 MG/150ML IV SOLN
750.0000 mg | Freq: Once | INTRAVENOUS | Status: AC
  Administered 2014-08-21: 750 mg via INTRAVENOUS
  Filled 2014-08-21: qty 150

## 2014-08-21 MED ORDER — SODIUM CHLORIDE 0.9 % IJ SOLN: 3.0000 mL | INTRAMUSCULAR | Status: DC | PRN

## 2014-08-21 MED ORDER — POLYETHYLENE GLYCOL 3350 17 G PO PACK: 17.0000 g | PACK | Freq: Every day | ORAL | Status: DC | PRN

## 2014-08-21 MED ORDER — ENOXAPARIN SODIUM 30 MG/0.3ML ~~LOC~~ SOLN
30.0000 mg | SUBCUTANEOUS | Status: DC
  Administered 2014-08-21 – 2014-08-23 (×3): 30 mg via SUBCUTANEOUS
  Filled 2014-08-21 (×3): qty 0.3

## 2014-08-21 MED ORDER — CLONAZEPAM 0.5 MG PO TABS
0.5000 mg | ORAL_TABLET | Freq: Two times a day (BID) | ORAL | Status: DC
  Administered 2014-08-21 – 2014-08-27 (×13): 0.5 mg via ORAL
  Filled 2014-08-21 (×13): qty 1

## 2014-08-21 MED ORDER — SODIUM CHLORIDE 0.9 % IV SOLN
1000.0000 mL | INTRAVENOUS | Status: DC
  Administered 2014-08-21 – 2014-08-22 (×3): 1000 mL via INTRAVENOUS

## 2014-08-21 MED ORDER — OXYCODONE HCL 5 MG PO TABS
5.0000 mg | ORAL_TABLET | ORAL | Status: DC | PRN
  Administered 2014-08-21 – 2014-08-27 (×6): 5 mg via ORAL
  Filled 2014-08-21 (×6): qty 1

## 2014-08-21 MED ORDER — SODIUM CHLORIDE 0.9 % IV SOLN: 250.0000 mL | INTRAVENOUS | Status: DC | PRN

## 2014-08-21 MED ORDER — MAGNESIUM OXIDE 400 (241.3 MG) MG PO TABS
400.0000 mg | ORAL_TABLET | Freq: Every day | ORAL | Status: DC
  Administered 2014-08-21 – 2014-08-27 (×7): 400 mg via ORAL
  Filled 2014-08-21 (×8): qty 1

## 2014-08-21 MED ORDER — MONTELUKAST SODIUM 10 MG PO TABS
10.0000 mg | ORAL_TABLET | Freq: Every day | ORAL | Status: DC
  Administered 2014-08-21 – 2014-08-26 (×6): 10 mg via ORAL
  Filled 2014-08-21 (×6): qty 1

## 2014-08-21 MED ORDER — BUMETANIDE 1 MG PO TABS
1.0000 mg | ORAL_TABLET | Freq: Two times a day (BID) | ORAL | Status: DC
  Administered 2014-08-21 (×2): 1 mg via ORAL
  Filled 2014-08-21 (×2): qty 1

## 2014-08-21 MED ORDER — SODIUM CHLORIDE 0.9 % IV BOLUS (SEPSIS)
1000.0000 mL | Freq: Once | INTRAVENOUS | Status: AC
  Administered 2014-08-21: 1000 mL via INTRAVENOUS

## 2014-08-21 MED ORDER — SODIUM CHLORIDE 0.9 % IV SOLN
1000.0000 mL | Freq: Once | INTRAVENOUS | Status: AC
Start: 2014-08-21 — End: 2014-08-21
  Administered 2014-08-21: 1000 mL via INTRAVENOUS

## 2014-08-21 MED ORDER — DEXTROSE 5 % IV SOLN
1.0000 g | Freq: Three times a day (TID) | INTRAVENOUS | Status: DC
  Administered 2014-08-21 – 2014-08-27 (×19): 1 g via INTRAVENOUS
  Filled 2014-08-21 (×22): qty 1

## 2014-08-21 MED ORDER — ONDANSETRON HCL 4 MG/2ML IJ SOLN
4.0000 mg | Freq: Once | INTRAMUSCULAR | Status: AC
  Administered 2014-08-21: 4 mg via INTRAVENOUS
  Filled 2014-08-21: qty 2

## 2014-08-21 MED ORDER — ONDANSETRON HCL 4 MG/2ML IJ SOLN: 4.0000 mg | Freq: Four times a day (QID) | INTRAMUSCULAR | Status: DC | PRN

## 2014-08-21 MED ORDER — ONDANSETRON HCL 4 MG PO TABS
4.0000 mg | ORAL_TABLET | Freq: Four times a day (QID) | ORAL | Status: DC | PRN
  Administered 2014-08-26: 4 mg via ORAL
  Filled 2014-08-21: qty 1

## 2014-08-21 MED ORDER — DEXTROSE 5 % IV SOLN
2.0000 g | Freq: Once | INTRAVENOUS | Status: AC
  Administered 2014-08-21: 2 g via INTRAVENOUS
  Filled 2014-08-21: qty 2

## 2014-08-21 NOTE — Progress Notes (Signed)
[facility] PROGRESS NOTE  Catherine Mccullough WEX:[id number] DOB: [birth year] DOA: 08/21/2014 PCP: Default, Provider, MD  Assessment/Plan:  Sepsis -Febrile, with max temp 100.4, hypotensive with SBP in the 80's Procalcitonin of 32.6. -Source remains unclear at this time. CXR and UA are negative for infection. Blood cultures are pending.  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  -Discussed with Dr Megan Salon, ID for Zyvox clearance, as patient refuses to take Vancomycin due to previous allergy. He  approves.  -Will cover with broad spectrum Zyvox and Aztreonam (PCN allergy) pending culture data.   -Continue IVF repletion.  Pulmonary Valve Regurgitation/ Heart failure -Work up done at [place] revealed severe PI and RV dilation. She is being considered for percutaneous pulmonary valve replacement at [facility]. -Will ask for cardiology consultation for assistance and guidance with fluid resuscitation given her valvular issues. She was scheduled for outpatient TEE tomorrow at [facility].   Hypotension  -Secondary to sepsis, continue IVF for now and hold antihypertensives. No need for vasopressors at present, but may need if PI and pulmonary edema limit fluid resuscitation.  -SBP 69-93, currently in the 80s  Kidney failure, chronic Stage IV -IVF -BUN 57, Creatinine baseline 1.9-2.5.  CHF, Right side  -Chronic -With hx of RV dilation and severe PI; as above will ask for cardiology assistance.   DM TYPE 2 -Not on medications -CBGs well controlled  Breast cancer metastasized to bone -Chronic -Follow management with outpatient oncologist.   Chronic pain  -PRN IV Fentanyl while hypotensive.    Code Status: Full  DVT prophylaxis: Lovenox Family Communication: Discussed with patient who understands and has no concerns at this time. Disposition Plan: Pt remains critically ill and in need of ICU services.     Consultants:  ID  Antibiotics:  Zyvox   Aztreonam   Subjective She reports last night she was extremely cold and unable to eliminate fluids.  29 year breast cancer survivor and denies currently undergoing any type of cancer treatment. She recently was diagnosed with MRSA which may have traveled to her heart. Has a TEE scheduled for tomorrow at [facility].  Dr. Guilford Shi- Cardiac Surgeon  Dr Quentin Cornwall- Cardiologist    Objective: Filed Vitals:   08/21/14 0230 08/21/14 0300 08/21/14 0330 08/21/14 0457  BP: 126/102 115/50 113/50   Pulse:      Temp:    100.4 F (38 C)  TempSrc:    Oral  Resp: 33 32 28   Height:      Weight:    61.8 kg (136 lb 3.9 oz)  SpO2:       No intake or output data in the 24 hours ending 08/21/14 0647 Filed Weights   08/21/14 0008 08/21/14 0457  Weight: 56.7 kg (125 lb) 61.8 kg (136 lb 3.9 oz)    Exam: General: NAD, looks comfortable, febrile  Cardiovascular: RRR, Has a murmur that appears to be both diastolic and systolic but I am unable to further characterize.  Respiratory: Mild crackles no wheezes no rhonchi  Abdomen: soft, non tender, no distention , bowel sounds normal Musculoskeletal: No edema b/l  Data Reviewed: Basic Metabolic Panel:  Recent Labs Lab 08/21/14 0029  NA 137  K 4.7  CL 103  CO2 20*  GLUCOSE 72  BUN 57*  CREATININE 2.15*  CALCIUM 10.1   Liver Function Tests:  Recent Labs Lab 08/21/14 0029  AST 29  ALT 13*  ALKPHOS 60  BILITOT 0.5  PROT 7.8  ALBUMIN 4.3   No results for input(s): LIPASE, AMYLASE in the last 168 hours. No results for input(s): AMMONIA in the last 168 hours. CBC:  Recent Labs Lab 08/21/14 0029  WBC 4.6  NEUTROABS 4.0  HGB 9.9*  HCT 31.6*  MCV 106.0*  PLT 129*   Cardiac Enzymes:  Recent Labs Lab 08/21/14 0029  TROPONINI <0.03   BNP (last 3 results)  Recent Labs  03/04/14 0726 08/21/14 0029  BNP 1040.0* 1557.0*    ProBNP (last 3 results)  Recent Labs  11/03/13 1642   PROBNP 6043.0*    CBG: No results for input(s): GLUCAP in the last 168 hours.  Recent Results (from the past 240 hour(s))  Culture, blood (routine x 2)     Status: None (Preliminary result)   Collection Time: 08/21/14 12:55 AM  Result Value Ref Range Status   Specimen Description BLOOD RIGHT HAND  Final   Special Requests BOTTLES DRAWN AEROBIC ONLY 6CC  Final   Culture PENDING  Incomplete   Report Status PENDING  Incomplete  Culture, blood (routine x 2)     Status: None (Preliminary result)   Collection Time: 08/21/14  1:10 AM  Result Value Ref Range Status   Specimen Description BLOOD RIGHT HAND  Final   Special Requests BOTTLES DRAWN AEROBIC ONLY 6CC  Final   Culture PENDING  Incomplete   Report Status PENDING  Incomplete  MRSA PCR Screening     Status: None   Collection Time: 08/21/14  4:40 AM  Result Value Ref Range Status   MRSA by PCR NEGATIVE NEGATIVE Final     Studies: Dg Chest Port 1 View  08/21/2014   CLINICAL DATA:  Acute onset of generalized body aches, chills, nausea and vomiting. Chest pain. Initial encounter.  EXAM: PORTABLE CHEST - 1 VIEW  COMPARISON:  Chest radiograph performed 03/04/2014  FINDINGS: The lungs are well-aerated. Pulmonary vascularity is at the upper limits of normal. There is no evidence of focal opacification, pleural effusion or pneumothorax.  The cardiomediastinal silhouette is borderline normal in size. There appears to be a mildly displaced left seventh lateral rib fracture.  IMPRESSION: 1. Lungs appear grossly clear. 2. Apparent mildly displaced left seventh lateral rib fracture. Would correlate for associated symptoms.   Electronically Signed   By: Garald Balding M.D.   On: 08/21/2014 01:42    Scheduled Meds: . sodium chloride   Intravenous STAT  . bumetanide  1 mg Oral BID  . citalopram  10 mg Oral Daily  . clonazePAM  0.5 mg Oral BID  . docusate sodium  100 mg Oral BID  . enoxaparin (LOVENOX) injection  30 mg Subcutaneous Q24H  .  exemestane  25 mg Oral Daily  . lisinopril  2.5 mg Oral Daily  . magnesium oxide  400 mg Oral Daily  . metoprolol  200 mg Oral Daily  . metoprolol succinate  50 mg Oral Daily  . montelukast  10 mg Oral QHS  . pantoprazole  40 mg Oral Daily  . pregabalin  75 mg Oral TID  . sodium bicarbonate  650 mg Oral BID  . sodium chloride  3 mL Intravenous Q12H   Continuous Infusions: . sodium chloride      Principal Problem:   Sepsis Active Problems:   Breast cancer metastasized to bone   Chronic pain   CHF (congestive heart failure)   CKD (chronic kidney disease)   DM type 2 (diabetes mellitus, type 2)   Pyrexia   Hypotension  AKI (acute kidney injury)   Atypical chest pain   Fracture five ribs-closed   Elevated brain natriuretic peptide (BNP) level   Elevated lactic acid level    Time spent: 50 miniutes Greater than 50% of this time was spent in direct contact with the patient coordinating care. Extensive time spent reviewing records from prior hospitalization and care at [place], MD   [facility] Pager [phone]  If 7PM-7AM, please contact night-coverage at [email or web address], password [facility] 08/21/2014, 6:47 AM  LOS: 0 days    I, Jessica D. Leonie Green, acting as scribe, recorded this note contemporaneously in the presence of Dr. Lelon Frohlich, M.D. on 08/21/2014.    I have reviewed the above documentation for accuracy and completeness, and I agree with the above.  Domingo Mend, MD [facility] Pager: [phone]

## 2014-08-21 NOTE — H&P (Addendum)
[facility] Admission History and Physical       Catherine Mccullough YBO:[id number] DOB: 15-Nov-1945 DOA: 08/21/2014  Referring physician: EDP PCP: Default, Provider, MD  Specialists:   Chief Complaint: Fever Chills Bodyaches  HPI: Catherine Mccullough is a 69 y.o. female with a history of Metastatic Breast Cancer (1989, 1999) S/P Chemo and Radiation Rx, CHF, HTN, DM2 and CKD who presents to the ED with complaints of Fevers, Chills, Myalgias, SOB and Chest pain with Nausea and Vomiting x 1 day.   She reports having 10/10 Chest Pain in the mid chest area.   She denies any cough or dysuria.   She was found to have a temperature of 100.6 in the ED.  A Sepsis workup was initiated.      Review of Systems:  Constitutional: No Weight Loss, No Weight Gain, Night Sweats, +Fevers, +Chills, +Myalgias,  Dizziness, Light Headedness, Fatigue, +Generalized Weakness HEENT: No Headaches, Difficulty Swallowing,Tooth/Dental Problems,Sore Throat,  No Sneezing, Rhinitis, Ear Ache, Nasal Congestion, or Post Nasal Drip,  Cardio-vascular:  +Chest pain, Orthopnea, PND, Edema in Lower Extremities, Anasarca, Dizziness, Palpitations  Resp: +Dyspnea, No DOE, No Productive Cough, No Non-Productive Cough, No Hemoptysis, No Wheezing.    GI: No Heartburn, Indigestion, Abdominal Pain, +Nausea, +Vomiting, Diarrhea, Constipation, Hematemesis, Hematochezia, Melena, Change in Bowel Habits,  Loss of Appetite  GU: No Dysuria, No Change in Color of Urine, No Urgency or Urinary Frequency, No Flank pain.  Musculoskeletal: No Joint Pain or Swelling, No Decreased Range of Motion, No Back Pain.  Neurologic: No Syncope, No Seizures, Muscle Weakness, Paresthesia, Vision Disturbance or Loss, No Diplopia, No Vertigo, No Difficulty Walking,  Skin: No Rash or Lesions. Psych: No Change in Mood or Affect, No Depression or Anxiety, No Memory loss, No Confusion, or Hallucinations   Past Medical History  Diagnosis Date  . Kidney disease     . Hypertension   . Diabetes mellitus   . 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 Arthritis   . CHF (congestive heart failure)   . Lymphedema of leg   . Anxiety      Past Surgical History  Procedure Laterality Date  . Breast surgery    . Cholecystectomy    . Abdominal hysterectomy    . Replacement total knee bilateral    . Hand tendon surgery        Prior to Admission medications   Medication Sig Start Date End Date Taking? Authorizing Provider  bumetanide (BUMEX) 1 MG tablet Take 1 mg by mouth 2 (two) times daily. 12/14/13   Historical Provider, MD  cetirizine (ZYRTEC) 10 MG tablet Take 5 mg by mouth daily as needed for allergies.    Historical Provider, MD  citalopram (CELEXA) 10 MG tablet Take 1 tablet by mouth daily. 05/01/14   Historical Provider, MD  clonazePAM (KLONOPIN) 0.5 MG tablet Take 1 tablet by mouth twice daily x 30 days. (use for Klonopin) Patient taking differently: Take 0.5 mg by mouth 2 (two) times daily.  10/08/12   [name], DO  docusate sodium (COLACE) 100 MG capsule Take 100 mg by mouth 2 (two) times daily.    Historical Provider, MD  esomeprazole (NEXIUM) 40 MG capsule Take 40 mg by mouth daily before breakfast.      Historical Provider, MD  exemestane (AROMASIN) 25 MG tablet Take 25 mg by mouth daily.      Historical Provider, MD  fluticasone (FLONASE) 50 MCG/ACT nasal spray Place 1 spray into  both nostrils daily as needed for allergies.  03/24/14   Historical Provider, MD  folic acid (FOLVITE) 1 MG tablet Take 1 mg by mouth daily.    Historical Provider, MD  lisinopril (PRINIVIL,ZESTRIL) 2.5 MG tablet Take 1 tablet by mouth daily. 05/01/14   Historical Provider, MD  LYRICA 75 MG capsule Take 75 mg by mouth 3 (three) times daily. 12/14/13   Historical Provider, MD  magnesium oxide (MAG-OX) 400 MG tablet Take 400 mg by mouth daily.    Historical Provider, MD  Melatonin 3 MG CAPS Take 3 mg by mouth at bedtime.    Historical Provider, MD  metoprolol (TOPROL-XL) 200  MG 24 hr tablet Take 200 mg by mouth daily. Takes with a 50 mg tablet to total 250 mg daily    Historical Provider, MD  metoprolol succinate (TOPROL-XL) 50 MG 24 hr tablet Take 50 mg by mouth daily. Take with Toprol XL 278m for a total of 2565m   Historical Provider, MD  montelukast (SINGULAIR) 10 MG tablet Take 10 mg by mouth at bedtime.      Historical Provider, MD  Multiple Vitamins-Minerals (MULTIVITAMINS THER. W/MINERALS) TABS Take 1 tablet by mouth daily.      Historical Provider, MD  oxyCODONE-acetaminophen (PERCOCET) 10-325 MG per tablet Take 1 tablet by mouth every 6 (six) hours as needed for pain. Patient taking differently: Take 1 tablet by mouth every 4 (four) hours as needed for pain.  11/03/13   RoCarmin MuskratMD  polyethylene glycol (MLaguna [facility] Center GLFloria Ravelingpacket Take 17 g by mouth daily as needed (constipation).     Historical Provider, MD  PROAIR HFA 108 (90 BASE) MCG/ACT inhaler Take 2 puffs by mouth every 6 (six) hours as needed. 04/11/14   Historical Provider, MD  sodium bicarbonate 650 MG tablet Take 1 tablet (650 mg total) by mouth 2 (two) times daily. Patient not taking: Reported on 05/04/2014 03/04/14   DoRipley FraiseMD  vitamin B-12 (CYANOCOBALAMIN) 100 MCG tablet Take 100 mcg by mouth daily.    Historical Provider, MD  vitamin C (ASCORBIC ACID) 500 MG tablet Take 500 mg by mouth daily.    Historical Provider, MD  vitamin E 400 UNIT capsule Take 400 Units by mouth daily.    Historical Provider, MD     Allergies  Allergen Reactions  . Mometasone Shortness Of Breath  . Vancomycin Itching  . Aspirin Other (See Comments)    "Inflames stomach"  . Contrast Media [Iodinated Diagnostic Agents] Other (See Comments)    Made Heart Stop.   . Cortisone Other (See Comments)    Hold fluid  . Doxycycline Nausea And Vomiting  . Ibuprofen Nausea And Vomiting  . Insulins Other (See Comments)    Pt says even the tiniest bit of insulin makes her go unconscious because her BS gets too  low  . Lasix [Furosemide] Other (See Comments)    Paradoxical Response  . Other     Iv bp med unknown.and adhesive tape-silicones  . Prednisone     Sweating   . Sulfamethoxazole Other (See Comments)    Bottomed out platelets  . Tape   . Ultram [Tramadol Hcl] Other (See Comments)    "Grand mal seizure"  . Codeine Rash  . Dilantin [Phenytoin Sodium] Rash  . Latex Rash  . Zosyn [Piperacillin-Tazobactam In Dex] Rash    Social History:  reports that she has never smoked. She has never used smokeless tobacco. She reports that she does not drink alcohol or  use illicit drugs.    No family history on file.     Physical Exam:  GEN:  Pleasant Obese  69 y.o. African American female examined and in no acute distress; cooperative with exam Filed Vitals:   08/21/14 0008 08/21/14 0153 08/21/14 0230 08/21/14 0300  BP: 127/64 95/31 126/102 115/50  Pulse: 81 80    Temp: 100.6 F (38.1 C)     TempSrc: Oral     Resp: 24 29 33 32  Height: 4' 6"  (1.372 m)     Weight: 56.7 kg (125 lb)     SpO2: 95% 92%     Blood pressure 115/50, pulse 80, temperature 100.6 F (38.1 C), temperature source Oral, resp. rate 32, height 4' 6"  (1.372 m), weight 56.7 kg (125 lb), SpO2 92 %. PSYCH: She is alert and oriented x4; does not appear anxious does not appear depressed; affect is normal HEENT: Normocephalic and Atraumatic, Mucous membranes pink; PERRLA; EOM intact; Fundi:  Benign;  No scleral icterus, Nares: Patent, Oropharynx: Clear, Sparse Dentition,    Neck:  FROM, No Cervical Lymphadenopathy nor Thyromegaly or Carotid Bruit; No JVD; Breasts:: Not examined CHEST WALL: No tenderness CHEST: Normal respiration, clear to auscultation bilaterally HEART: Regular rate and rhythm; no murmurs rubs or gallops BACK: No kyphosis or scoliosis; No CVA tenderness ABDOMEN: Positive Bowel Sounds, Obese, Soft Non-Tender, No Rebound or Guarding; No Masses, No Organomegaly. Rectal Exam: Not done EXTREMITIES: No Cyanosis,  Clubbing, or Edema; No Ulcerations. Genitalia: not examined PULSES: 2+ and symmetric SKIN: Normal hydration no rash or ulceration CNS:  Alert and Oriented x 4, No Focal Deficits Vascular: pulses palpable throughout    Labs on Admission:  Basic Metabolic Panel:  Recent Labs Lab 08/21/14 0029  NA 137  K 4.7  CL 103  CO2 20*  GLUCOSE 72  BUN 57*  CREATININE 2.15*  CALCIUM 10.1   Liver Function Tests:  Recent Labs Lab 08/21/14 0029  AST 29  ALT 13*  ALKPHOS 60  BILITOT 0.5  PROT 7.8  ALBUMIN 4.3   No results for input(s): LIPASE, AMYLASE in the last 168 hours. No results for input(s): AMMONIA in the last 168 hours. CBC:  Recent Labs Lab 08/21/14 0029  WBC 4.6  NEUTROABS 4.0  HGB 9.9*  HCT 31.6*  MCV 106.0*  PLT 129*   Cardiac Enzymes:  Recent Labs Lab 08/21/14 0029  TROPONINI <0.03    BNP (last 3 results)  Recent Labs  03/04/14 0726 08/21/14 0029  BNP 1040.0* 1557.0*    ProBNP (last 3 results)  Recent Labs  11/03/13 1642  PROBNP 6043.0*    CBG: No results for input(s): GLUCAP in the last 168 hours.  Radiological Exams on Admission: Dg Chest Port 1 View  08/21/2014   CLINICAL DATA:  Acute onset of generalized body aches, chills, nausea and vomiting. Chest pain. Initial encounter.  EXAM: PORTABLE CHEST - 1 VIEW  COMPARISON:  Chest radiograph performed 03/04/2014  FINDINGS: The lungs are well-aerated. Pulmonary vascularity is at the upper limits of normal. There is no evidence of focal opacification, pleural effusion or pneumothorax.  The cardiomediastinal silhouette is borderline normal in size. There appears to be a mildly displaced left seventh lateral rib fracture.  IMPRESSION: 1. Lungs appear grossly clear. 2. Apparent mildly displaced left seventh lateral rib fracture. Would correlate for associated symptoms.   Electronically Signed   By: Garald Balding M.D.   On: 08/21/2014 01:42     EKG: Independently reviewed. Normal Sinus Rhythm  Rate = 86        Assessment/Plan:   68 y.o. female with  Principal Problem:   1.    Sepsis/Pyrexia   IV Aztreonam and Levaquin    ( Patient has Allergies to Vanc, Zosyn (PCN))   IVFs   Active Problems:   2.    Hypotension   IVFs   Hold Anti-Hypertensives     3.    Atypical Chest pain/Left Rib Fracture   Due to Rib Fx   Cardiac Monitoring   Pain Control PRN     4.    AKI   IVFs   Monitor BUN/Cr     4.    CHF (congestive heart failure)   Chronic     5.   CKD (chronic kidney disease)   Monitor BUN/Cr     6.   DM type 2 (diabetes mellitus, type 2)- not on meds   Check HbA1C.        7.   Breast cancer metastasized to bone   Chronic     8.   Chronic pain   PRN IV Fentanyl while hypotensive     9.   DVT Prophylaxis   Lovenox       Code Status:     FULL CODE       Family Communication:   No Family Present    Disposition Plan:    Inpatient Status        Time spent:  [facility] Pager [phone]   If [place] Please Contact the Day Rounding Team MD for [facility]  If 7PM-7AM, Please Contact Night-Floor Coverage  [email or web address] Password [facility] 08/21/2014, 3:34 AM     ADDENDUM:   Patient was seen and examined on 08/21/2014

## 2014-08-21 NOTE — ED Notes (Signed)
Patient refuse to have her heplock restarted. Stated she would dot my eyes out if I tried to do so.

## 2014-08-21 NOTE — Progress Notes (Signed)
Echo shows normal LV function. RV appears to be moderately dilated, with low normal function. Pulmonic valve poorly visualized, at least moderate regurgitation. Mildly pulm HTN, IVC suggests RA pressure of 8. She does not appear volume overloaded by exam. In setting of septic shock and given echo findings would continue IVFs as written at 125 mL/hr for today, reevaluate tomorrow AM.   Zandra Abts MD

## 2014-08-21 NOTE — Consult Note (Signed)
CARDIOLOGY CONSULT NOTE   Patient ID: VIONA HOSKING MRN: [id number] DOB/AGE: Jan 23, 1945 69 y.o.  Admit Date: 08/21/2014 Referring Physician: PTH Primary Physician: Default, Provider, MD Consulting Cardiologist: Carlyle Dolly MD Primary Cardiologist: Aram Beecham, Shanon Brow MD-[facility] Reason for Consultation: "How aggressive can we be on fluid resuscitation in the setting of pulmonary valve disease?"   Clinical Summary Ms. Skarda is a medically complex 69 y.o.female  with history of metastatic breast CA, with chemo and radiation, CHF, pumonary  hypertension, diabetes, hypertension, iron deficiency, and   CKD who was admitted with sepsis. She is being followed by [facility] for non-rheumatic pulmonary valve insufficiency, and is planned for TEE.  systolic CHF and is due to see Dr. Maylon Peppers tomorrow. She was not found to be a candidate for valvular replacement, but would be considered for percutaneous replacement per Care Everywhere notes. "Echo showed severe PI with RV dilation. She is not a good surgical candidate due to multiple comorbidities. We will consider her for percutaneous pul valve replacement, possibly with Sapien XT"   We are asked for cardiology recommendation for fluid replacement in the setting of systolic CHF and valvular disease.   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 Her eyes began to itch and she rubbed them but the itching got worse. Has generalized arthritic pain especially in her back and joints.   In ER, she complained of dyspnea, BP 124/64, febrile at 100.6, lactic acid 3.11 and 4.52 respectively. Creatinine 2.15, ALT 13, Hgb 9.9; Hct 31.6, PLTs 129;, without leukocytosis. BNP 1557.0.CXR was negative for CHF or pneumonia. There was a displaced left 7th lateral rib fx noted. She was started on levofloxacin and aztreonam.   Allergies  Allergen Reactions  . Mometasone Shortness Of  Breath  . Vancomycin Itching  . Aspirin Other (See Comments)    "Inflames stomach"  . Contrast Media [Iodinated Diagnostic Agents] Other (See Comments)    Made Heart Stop.   . Cortisone Other (See Comments)    Hold fluid  . Doxycycline Nausea And Vomiting  . Ibuprofen Nausea And Vomiting  . Insulins Other (See Comments)    Pt says even the tiniest bit of insulin makes her go unconscious because her BS gets too low  . Lasix [Furosemide] Other (See Comments)    Paradoxical Response  . Other     Iv bp med unknown.and adhesive tape-silicones  . Prednisone     Sweating   . Sulfamethoxazole Other (See Comments)    Bottomed out platelets  . Tape   . Ultram [Tramadol Hcl] Other (See Comments)    "Grand mal seizure"  . Codeine Rash  . Dilantin [Phenytoin Sodium] Rash  . Latex Rash  . Piperacillin Sod-Tazobactam So Rash    Medications Scheduled Medications: . sodium chloride   Intravenous STAT  . aztreonam  1 g Intravenous Q8H  . bumetanide  1 mg Oral BID  . citalopram  10 mg Oral Daily  . clonazePAM  0.5 mg Oral BID  . docusate sodium  100 mg Oral BID  . enoxaparin (LOVENOX) injection  30 mg Subcutaneous Q24H  . exemestane  25 mg Oral Daily  . linezolid  600 mg Oral Q12H  . magnesium oxide  400 mg Oral Daily  . montelukast  10 mg Oral QHS  . pantoprazole  40 mg Oral Daily  . pregabalin  75 mg Oral  TID  . sodium bicarbonate  650 mg Oral BID  . sodium chloride  3 mL Intravenous Q12H    Infusions: . sodium chloride      PRN Medications: sodium chloride, acetaminophen **OR** acetaminophen, alum & mag hydroxide-simeth, HYDROmorphone (DILAUDID) injection, ondansetron **OR** ondansetron (ZOFRAN) IV, oxyCODONE, polyethylene glycol, sodium chloride   Past Medical History  Diagnosis Date  . Kidney disease   . Hypertension   . Diabetes mellitus   . 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 Arthritis   . CHF (congestive heart failure)   . Lymphedema of leg   . Anxiety     Past  Surgical History  Procedure Laterality Date  . Breast surgery    . Cholecystectomy    . Abdominal hysterectomy    . Replacement total knee bilateral    . Hand tendon surgery      No family history on file.  Social History Ms. Butner reports that she has never smoked. She has never used smokeless tobacco. Ms. Gaines reports that she does not drink alcohol.  Review of Systems Complete review of systems are found to be negative unless outlined in H&P above.  Physical Examination Blood pressure 111/44, pulse 83, temperature 100.1 F (37.8 C), temperature source Oral, resp. rate 20, height _0  (1.372 m), weight 136 lb 3.9 oz (61.8 kg), SpO2 100 %.  Intake/Output Summary (Last 24 hours) at 08/21/14 1143 Last data filed at 08/21/14 1100  Gross per 24 hour  Intake    425 ml  Output    300 ml  Net    125 ml    Telemetry: NSR  GEN: Generalized weakness and fatigue.  HEENT: Conjunctiva and lids erythematous, and edematous, oropharynx clear with moist mucosa. Neck: Supple, mild JVP, right carotid bruits, no thyromegaly. Lungs: Clear to auscultation, nonlabored breathing at rest. Cardiac: Regular rate and rhythm, no S3 or significant systolic murmur, no pericardial rub. Abdomen: Soft, nontender, no hepatomegaly, bowel sounds present, no guarding or rebound. Extremities: 1+ pitting edema, distal pulses 2+. Skin: Warm and dry. Musculoskeletal: No kyphosis. Neuropsychiatric: Alert and oriented x3, affect grossly appropriate.  Prior Cardiac Testing/Procedures Echo at [facility]- results are not available in [place].   Lab Results  Basic Metabolic Panel:  Recent Labs Lab 08/21/14 0029 08/21/14 0709  NA 137 136  K 4.7 4.7  CL 103 108  CO2 20* 17*  GLUCOSE 72 69  BUN 57* 54*  CREATININE 2.15* 2.06*  CALCIUM 10.1 8.5*    Liver Function Tests:  Recent Labs Lab 08/21/14 0029  AST 29  ALT 13*  ALKPHOS 60  BILITOT 0.5  PROT 7.8  ALBUMIN 4.3    CBC:  Recent  Labs Lab 08/21/14 0029 08/21/14 0709  WBC 4.6 6.0  NEUTROABS 4.0  --   HGB 9.9* 8.4*  HCT 31.6* 26.4*  MCV 106.0* 105.6*  PLT 129* 96*    Cardiac Enzymes:  Recent Labs Lab 08/21/14 0029  TROPONINI <0.03    Radiology: Dg Chest Port 1 View  08/21/2014   CLINICAL DATA:  Acute onset of generalized body aches, chills, nausea and vomiting. Chest pain. Initial encounter.  EXAM: PORTABLE CHEST - 1 VIEW  COMPARISON:  Chest radiograph performed 03/04/2014  FINDINGS: The lungs are well-aerated. Pulmonary vascularity is at the upper limits of normal. There is no evidence of focal opacification, pleural effusion or pneumothorax.  The cardiomediastinal silhouette is borderline normal in size. There appears to be a mildly displaced left  seventh lateral rib fracture.  IMPRESSION: 1. Lungs appear grossly clear. 2. Apparent mildly displaced left seventh lateral rib fracture. Would correlate for associated symptoms.   Electronically Signed   By: Garald Balding M.D.   On: 08/21/2014 01:42     ECG: NSR with PACs. No acute ST-T wave abnormalities.   Impression and Recommendations  1. Non-rheumatic pulmonary valve stenosis: Being followed at [facility] for treatment with planned TEE today and valvuloplasty this week. However breathing status and severe fatigue was worsened prompting admission. We will repeat echo as we are unable to retreive notes from [place] concerning her LV fx and valvular status.  She wishes continued treatment here for now and does wish to be transferred to [facility] unless medically necessary. Will request results of echo from [facility] and also check for prior cardiac cath.   2. Systolic CHF: She is essentially euvolemic, JVD is elevated, likely from RV failure. Some mild LEE. Would continue fluids at current rate for now until we are able to see results of echo. Breathing status is at baseline now per patient.   3. Sepsis: Continue abx therapy and fluids resuscitation. We will make  further recommendations after echo is read.   4. CKD: Monitor renal function with labs. Pharmacy to assist if necessary with abx treatment.   Signed: Phill Myron. Lawrence NP [place]  08/21/2014, 11:43 AM Co-Sign MD  Patient seen and discussed with NP Grau Nails, I agree with her documentation. 69 yo female history of metastatic breast CA s/p chemo and radiation, HTN, DM2, CKD admitted with fevers, SOB, and body aches. She also has history of pulmonary valve regurgitation, pulm HTN adn is followed at [facility]. From there records she is deemed a poor surgical candidate for pulmonic valve replacement, and is being considered for percutaneous valve replacement. There were plans for TEE as part of her workup this week.   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  08/2011 echo LVEF 55-60%, PASP 42, mild RV dilatation, grade I diastolic dysfunction BNP 1478, Hgb 9.9, WBC 4.6, Plt 129, K 4.7, Cr 2.15 (baseline variable 1.8-3 over the last several years), lactic acid 3.11 CXR left rib fracture EKG SR with PACs  Patient with history of severe PI from [facility] notes, I cannot find actual echo report, and is being considered for percutaneous valve replacement. Admitted with septic shock, no clear source at this time, abx per primary team and ID.No pressor requirement currently , bp responded to IVFs. Will obtain echo to evaluate LV and RV function in setting of septic shock. Continue to hold oral bp meds.Continue IVF at current rate for now, patient without significant evidence of fluid overload.   Zandra Abts MD

## 2014-08-21 NOTE — ED Notes (Signed)
Patient refuses to be re cannulated for another heplock

## 2014-08-21 NOTE — Progress Notes (Signed)
Informed Dr. Jerilee Hoh patient refused Vancomycin. Patient states she had a bad reaction from vancomycin in the past. Informed MD of low BP.

## 2014-08-21 NOTE — Progress Notes (Signed)
ANTIBIOTIC CONSULT NOTE - INITIAL  Pharmacy Consult for Aztreonam Indication: rule out sepsis  Allergies  Allergen Reactions  . Mometasone Shortness Of Breath  . Vancomycin Itching  . Aspirin Other (See Comments)    "Inflames stomach"  . Contrast Media [Iodinated Diagnostic Agents] Other (See Comments)    Made Heart Stop.   . Cortisone Other (See Comments)    Hold fluid  . Doxycycline Nausea And Vomiting  . Ibuprofen Nausea And Vomiting  . Insulins Other (See Comments)    Pt says even the tiniest bit of insulin makes her go unconscious because her BS gets too low  . Lasix [Furosemide] Other (See Comments)    Paradoxical Response  . Other     Iv bp med unknown.and adhesive tape-silicones  . Prednisone     Sweating   . Sulfamethoxazole Other (See Comments)    Bottomed out platelets  . Tape   . Ultram [Tramadol Hcl] Other (See Comments)    "Grand mal seizure"  . Codeine Rash  . Dilantin [Phenytoin Sodium] Rash  . Latex Rash  . Piperacillin Sod-Tazobactam So Rash    Patient Measurements: Height: _0  (137.2 cm) Weight: 136 lb 3.9 oz (61.8 kg) IBW/kg (Calculated) : 31.7  Vital Signs: Temp: 100.1 F (37.8 C) (08/15 0753) Temp Source: Oral (08/15 0753) BP: 93/49 mmHg (08/15 0500) Pulse Rate: 83 (08/15 0700) Intake/Output from previous day:   Intake/Output from this shift: Total I/O In: -  Out: 300 [Urine:300]  Labs:  Recent Labs  08/21/14 0029 08/21/14 0709  WBC 4.6 6.0  HGB 9.9* 8.4*  PLT 129* 96*  CREATININE 2.15* 2.06*   Estimated Creatinine Clearance: 18.3 mL/min (by C-G formula based on Cr of 2.06). No results for input(s): VANCOTROUGH, VANCOPEAK, VANCORANDOM, GENTTROUGH, GENTPEAK, GENTRANDOM, TOBRATROUGH, TOBRAPEAK, TOBRARND, AMIKACINPEAK, AMIKACINTROU, AMIKACIN in the last 72 hours.   Microbiology: Recent Results (from the past 720 hour(s))  Culture, blood (routine x 2)     Status: None (Preliminary result)   Collection Time: 08/21/14 12:55 AM   Result Value Ref Range Status   Specimen Description BLOOD RIGHT HAND  Final   Special Requests BOTTLES DRAWN AEROBIC ONLY 6CC  Final   Culture NO GROWTH < 12 HOURS  Final   Report Status PENDING  Incomplete  Culture, blood (routine x 2)     Status: None (Preliminary result)   Collection Time: 08/21/14  1:10 AM  Result Value Ref Range Status   Specimen Description BLOOD RIGHT HAND  Final   Special Requests BOTTLES DRAWN AEROBIC ONLY 6CC  Final   Culture NO GROWTH < 12 HOURS  Final   Report Status PENDING  Incomplete  MRSA PCR Screening     Status: None   Collection Time: 08/21/14  4:40 AM  Result Value Ref Range Status   MRSA by PCR NEGATIVE NEGATIVE Final   Medical History: Past Medical History  Diagnosis Date  . Kidney disease   . Hypertension   . Diabetes mellitus   . 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 Arthritis   . CHF (congestive heart failure)   . Lymphedema of leg   . Anxiety    Anti-infectives    Start     Dose/Rate Route Frequency Ordered Stop   08/21/14 1200  aztreonam (AZACTAM) 1 g in dextrose 5 % 50 mL IVPB     1 g 100 mL/hr over 30 Minutes Intravenous Every 8 hours 08/21/14 1025     08/21/14 1000  linezolid (ZYVOX) tablet 600 mg     600 mg Oral Every 12 hours 08/21/14 0926     08/21/14 0715  vancomycin (VANCOCIN) IVPB 1000 mg/200 mL premix  Status:  Discontinued     1,000 mg 200 mL/hr over 60 Minutes Intravenous  Once 08/21/14 0711 08/21/14 0926   08/21/14 0200  levofloxacin (LEVAQUIN) IVPB 750 mg     750 mg 100 mL/hr over 90 Minutes Intravenous  Once 08/21/14 0158 08/21/14 0434   08/21/14 0200  aztreonam (AZACTAM) 2 g in dextrose 5 % 50 mL IVPB     2 g 100 mL/hr over 30 Minutes Intravenous  Once 08/21/14 0158 08/21/14 0434     Assessment: 69yo female admitted with low grade fever and suspected sepsis.  Source remains unclear, cultures pending.  Pt has distant h/o MRSA infection.  SCr elevated.  Normalized clcr 25-27m/min.  Pt has refused Vancomycin  stating previous allergic rxn, ID has given OK for Zyvox. D/W Dr HJerilee Hoh  Levaquin has been stopped.   Goal of Therapy:  Eradicate infection.  Plan:  Continue Zyvox 6090mPO q12hrs Aztreonam 1gm IV q8h Deescalate ABX when improved / appropriate Monitor labs, renal fxn, progress, and c/s  [place] CraneScott A 08/21/2014,10:38 AM

## 2014-08-21 NOTE — Progress Notes (Signed)
ANTIBIOTIC CONSULT NOTE-Preliminary  Pharmacy Consult for Vancomycin Indication: rule out sepsis  Allergies  Allergen Reactions  . Mometasone Shortness Of Breath  . Vancomycin Itching  . Aspirin Other (See Comments)    "Inflames stomach"  . Contrast Media [Iodinated Diagnostic Agents] Other (See Comments)    Made Heart Stop.   . Cortisone Other (See Comments)    Hold fluid  . Doxycycline Nausea And Vomiting  . Ibuprofen Nausea And Vomiting  . Insulins Other (See Comments)    Pt says even the tiniest bit of insulin makes her go unconscious because her BS gets too low  . Lasix [Furosemide] Other (See Comments)    Paradoxical Response  . Other     Iv bp med unknown.and adhesive tape-silicones  . Prednisone     Sweating   . Sulfamethoxazole Other (See Comments)    Bottomed out platelets  . Tape   . Ultram [Tramadol Hcl] Other (See Comments)    "Grand mal seizure"  . Codeine Rash  . Dilantin [Phenytoin Sodium] Rash  . Latex Rash  . Zosyn [Piperacillin-Tazobactam In Dex] Rash    Patient Measurements: Height: 4' 6"  (137.2 cm) Weight: 136 lb 3.9 oz (61.8 kg) IBW/kg (Calculated) : 31.7kg   Vital Signs: Temp: 100.4 F (38 C) (08/15 0457) Temp Source: Oral (08/15 0457) BP: 113/50 mmHg (08/15 0330) Pulse Rate: 80 (08/15 0153)  Labs:  Recent Labs  08/21/14 0029  WBC 4.6  HGB 9.9*  PLT 129*  CREATININE 2.15*    Estimated Creatinine Clearance: 17.5 mL/min (by C-G formula based on Cr of 2.15).     Microbiology: Recent Results (from the past 720 hour(s))  Culture, blood (routine x 2)     Status: None (Preliminary result)   Collection Time: 08/21/14 12:55 AM  Result Value Ref Range Status   Specimen Description BLOOD RIGHT HAND  Final   Special Requests BOTTLES DRAWN AEROBIC ONLY 6CC  Final   Culture PENDING  Incomplete   Report Status PENDING  Incomplete  Culture, blood (routine x 2)     Status: None (Preliminary result)   Collection Time: 08/21/14  1:10 AM   Result Value Ref Range Status   Specimen Description BLOOD RIGHT HAND  Final   Special Requests BOTTLES DRAWN AEROBIC ONLY 6CC  Final   Culture PENDING  Incomplete   Report Status PENDING  Incomplete  MRSA PCR Screening     Status: None   Collection Time: 08/21/14  4:40 AM  Result Value Ref Range Status   MRSA by PCR NEGATIVE NEGATIVE Final    Medical History: Past Medical History  Diagnosis Date  . Kidney disease   . Hypertension   . Diabetes mellitus   . 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 Arthritis   . CHF (congestive heart failure)   . Lymphedema of leg   . Anxiety     Medications:  Aztreonam 2 grams IV x1, Levaquin 750 mg IV x1   Assessment: 69 yo female with CrCl 17.5 ml/min being started on Vancomycin.  Pt reports itching with Vancomycin.  Consider diphenhydramine prior to doses if itching occurs.  Goal of Therapy:  Vancomycin trough level 15-20 mcg/ml  Plan:  Preliminary review of pertinent patient information completed.  Protocol will be initiated with a one-time dose(s) of Vancomycin 1 gram IV.  Forestine Na clinical pharmacist will complete review during morning rounds to assess patient and finalize treatment regimen.  Beryle Lathe, [facility] 08/21/2014,7:13 AM

## 2014-08-21 NOTE — Care Management Note (Signed)
Case Management Note  Patient Details  Name: Catherine Mccullough MRN: [id number] Date of Birth: 08/22/1945  Expected Discharge Date:                  Expected Discharge Plan:  [place]  In-House Referral:  NA  Discharge planning Services  CM Consult  Post Acute Care Choice:  NA Choice offered to:  NA  DME Arranged:    DME Agency:     [name]:    [place] Agency:  [place]  Status of Service:  In process, will continue to follow  Medicare Important Message Given:    Date Medicare IM Given:    Medicare IM give by:    Date Additional Medicare IM Given:    Additional Medicare Important Message give by:     If discussed at [place] of Stay Meetings, dates discussed:    Additional Comments: Pt is from home, lives alone and is independent at baseline. Pt uses a walker and cane PRN. Pt has used [facility] in the past and would like them again if necessary. Pt plans to return home with [facility] services. Will cont to follow for DC planning.  Sherald Barge, RN 08/21/2014, 3:08 PM

## 2014-08-21 NOTE — ED Provider Notes (Signed)
History  This chart was scribed for Catherine Fuel, MD by Marlowe Kays, ED Scribe. This patient was seen in room APA19/APA19 and the patient's care was started at 12:31 AM.  Chief Complaint  Patient presents with  . Shortness of Breath   The history is provided by the patient and medical records. No language interpreter was used.    HPI Comments:  Catherine Mccullough is a 69 y.o. female brought in by EMS, who presents to the Emergency Department complaining of generalized body aches that began yesterday. She reports associated chills, nausea and vomiting. She reports CP as well. Pt rates her pain as 10/10. She has not done anything to treat her pain. She denies modifying factors. She denies difficulty urinating. She reports that she was seen by her PCP two weeks ago. PMHx of kidney disease, HTN, DM, cancer, arthritis, CHF and anxiety.  Past Medical History  Diagnosis Date  . Kidney disease   . Hypertension   . Diabetes mellitus   . 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 Arthritis   . CHF (congestive heart failure)   . Lymphedema of leg   . Anxiety    Past Surgical History  Procedure Laterality Date  . Breast surgery    . Cholecystectomy    . Abdominal hysterectomy    . Replacement total knee bilateral    . Hand tendon surgery     No family history on file. Social History  Substance Use Topics  . Smoking status: Never Smoker   . Smokeless tobacco: Never Used  . Alcohol Use: No   OB History    No data available     Review of Systems  Constitutional: Positive for chills.  Cardiovascular: Positive for chest pain.  Gastrointestinal: Positive for nausea and vomiting.  Genitourinary: Negative for difficulty urinating.  Musculoskeletal: Positive for myalgias (generalized body aches).  All other systems reviewed and are negative.   Allergies  Mometasone; Vancomycin; Aspirin; Contrast media; Cortisone; Doxycycline; Ibuprofen; Insulins; Lasix; Other; Prednisone; Sulfamethoxazole;  Tape; Ultram; Codeine; Dilantin; Latex; and Zosyn  Home Medications   Prior to Admission medications   Medication Sig Start Date End Date Taking? Authorizing Provider  bumetanide (BUMEX) 1 MG tablet Take 1 mg by mouth 2 (two) times daily. 12/14/13   Historical Provider, MD  cetirizine (ZYRTEC) 10 MG tablet Take 5 mg by mouth daily as needed for allergies.    Historical Provider, MD  citalopram (CELEXA) 10 MG tablet Take 1 tablet by mouth daily. 05/01/14   Historical Provider, MD  clonazePAM (KLONOPIN) 0.5 MG tablet Take 1 tablet by mouth twice daily x 30 days. (use for Klonopin) Patient taking differently: Take 0.5 mg by mouth 2 (two) times daily.  10/08/12   [name], DO  docusate sodium (COLACE) 100 MG capsule Take 100 mg by mouth 2 (two) times daily.    Historical Provider, MD  esomeprazole (NEXIUM) 40 MG capsule Take 40 mg by mouth daily before breakfast.      Historical Provider, MD  exemestane (AROMASIN) 25 MG tablet Take 25 mg by mouth daily.      Historical Provider, MD  fluticasone (FLONASE) 50 MCG/ACT nasal spray Place 1 spray into both nostrils daily as needed for allergies.  03/24/14   Historical Provider, MD  folic acid (FOLVITE) 1 MG tablet Take 1 mg by mouth daily.    Historical Provider, MD  lisinopril (PRINIVIL,ZESTRIL) 2.5 MG tablet Take 1 tablet by mouth daily. 05/01/14  Historical Provider, MD  LYRICA 75 MG capsule Take 75 mg by mouth 3 (three) times daily. 12/14/13   Historical Provider, MD  magnesium oxide (MAG-OX) 400 MG tablet Take 400 mg by mouth daily.    Historical Provider, MD  Melatonin 3 MG CAPS Take 3 mg by mouth at bedtime.    Historical Provider, MD  metoprolol (TOPROL-XL) 200 MG 24 hr tablet Take 200 mg by mouth daily. Takes with a 50 mg tablet to total 250 mg daily    Historical Provider, MD  metoprolol succinate (TOPROL-XL) 50 MG 24 hr tablet Take 50 mg by mouth daily. Take with Toprol XL 280m for a total of 2558m   Historical Provider, MD  montelukast  (SINGULAIR) 10 MG tablet Take 10 mg by mouth at bedtime.      Historical Provider, MD  Multiple Vitamins-Minerals (MULTIVITAMINS THER. W/MINERALS) TABS Take 1 tablet by mouth daily.      Historical Provider, MD  oxyCODONE-acetaminophen (PERCOCET) 10-325 MG per tablet Take 1 tablet by mouth every 6 (six) hours as needed for pain. Patient taking differently: Take 1 tablet by mouth every 4 (four) hours as needed for pain.  11/03/13   RoCarmin MuskratMD  polyethylene glycol ([facility] GLFloria Ravelingpacket Take 17 g by mouth daily as needed (constipation).     Historical Provider, MD  PROAIR HFA 108 (90 BASE) MCG/ACT inhaler Take 2 puffs by mouth every 6 (six) hours as needed. 04/11/14   Historical Provider, MD  sodium bicarbonate 650 MG tablet Take 1 tablet (650 mg total) by mouth 2 (two) times daily. Patient not taking: Reported on 05/04/2014 03/04/14   DoRipley FraiseMD  vitamin B-12 (CYANOCOBALAMIN) 100 MCG tablet Take 100 mcg by mouth daily.    Historical Provider, MD  vitamin C (ASCORBIC ACID) 500 MG tablet Take 500 mg by mouth daily.    Historical Provider, MD  vitamin E 400 UNIT capsule Take 400 Units by mouth daily.    Historical Provider, MD   Triage Vitals: BP 127/64 mmHg  Pulse 81  Temp(Src) 100.6 F (38.1 C) (Oral)  Resp 24  Ht 4' 6"  (1.372 m)  Wt 125 lb (56.7 kg)  BMI 30.12 kg/m2  SpO2 95% Physical Exam  Constitutional: She is oriented to person, place, and time. She appears well-developed and well-nourished.  HENT:  Head: Normocephalic and atraumatic.  Eyes: EOM are normal. Pupils are equal, round, and reactive to light.  Neck: Normal range of motion. Neck supple. No JVD present.  Cardiovascular: Normal rate, regular rhythm and normal heart sounds.   No murmur heard. Pulmonary/Chest: Effort normal and breath sounds normal. She has no wheezes. She has no rales. She exhibits no tenderness.  Abdominal: Soft. Bowel sounds are normal. She exhibits no distension and no mass. There is no  tenderness.  Musculoskeletal: Normal range of motion. She exhibits no edema.  Lymphadenopathy:    She has no cervical adenopathy.  Neurological: She is alert and oriented to person, place, and time. No cranial nerve deficit. She exhibits normal muscle tone. Coordination normal.  Skin: Skin is warm and dry. No rash noted.  Psychiatric: She has a normal mood and affect. Her behavior is normal. Judgment and thought content normal.  Nursing note and vitals reviewed.   ED Course  Procedures (including critical care time) DIAGNOSTIC STUDIES: Oxygen Saturation is 95% on RA, adequate by my interpretation.   COORDINATION OF CARE: 12:38 AM- Will order CXR, urinalysis and lab work. Will order Tylenol and  Zofran. Pt verbalizes understanding and agrees to plan.  Medications  0.9 %  sodium chloride infusion (0 mLs Intravenous Stopped 08/21/14 0146)    Followed by  0.9 %  sodium chloride infusion (not administered)  0.9 %  sodium chloride infusion (not administered)  metoprolol succinate (TOPROL-XL) 24 hr tablet 50 mg (not administered)  clonazePAM (KLONOPIN) tablet 0.5 mg (not administered)  magnesium oxide (MAG-OX) tablet 400 mg (not administered)  montelukast (SINGULAIR) tablet 10 mg (not administered)  pregabalin (LYRICA) capsule 75 mg (not administered)  bumetanide (BUMEX) tablet 1 mg (not administered)  citalopram (CELEXA) tablet 10 mg (not administered)  lisinopril (PRINIVIL,ZESTRIL) tablet 2.5 mg (not administered)  sodium bicarbonate tablet 650 mg (not administered)  docusate sodium (COLACE) capsule 100 mg (not administered)  metoprolol succinate (TOPROL-XL) 24 hr tablet 200 mg (not administered)  exemestane (AROMASIN) tablet 25 mg (not administered)  pantoprazole (PROTONIX) EC tablet 40 mg (not administered)  polyethylene glycol (MIRALAX / GLYCOLAX) packet 17 g (not administered)  acetaminophen (TYLENOL) tablet 650 mg (not administered)    Or  acetaminophen (TYLENOL) suppository 650  mg (not administered)  oxyCODONE (Oxy IR/ROXICODONE) immediate release tablet 5 mg (not administered)  HYDROmorphone (DILAUDID) injection 0.5-1 mg (not administered)  ondansetron (ZOFRAN) tablet 4 mg (not administered)    Or  ondansetron (ZOFRAN) injection 4 mg (not administered)  alum & mag hydroxide-simeth (MAALOX/MYLANTA) 200-200-20 MG/5ML suspension 30 mL (not administered)  enoxaparin (LOVENOX) injection 30 mg (not administered)  sodium chloride 0.9 % injection 3 mL (not administered)  sodium chloride 0.9 % injection 3 mL (not administered)  0.9 %  sodium chloride infusion (not administered)  vancomycin (VANCOCIN) IVPB 1000 mg/200 mL premix (1,000 mg Intravenous Not Given 08/21/14 0812)  ondansetron (ZOFRAN) injection 4 mg (4 mg Intravenous Given 08/21/14 0100)  acetaminophen (TYLENOL) tablet 650 mg (650 mg Oral Given 08/21/14 0145)  sodium chloride 0.9 % bolus 1,000 mL (0 mLs Intravenous Stopped 08/21/14 0335)  levofloxacin (LEVAQUIN) IVPB 750 mg (0 mg Intravenous Stopped 08/21/14 0434)  aztreonam (AZACTAM) 2 g in dextrose 5 % 50 mL IVPB (0 g Intravenous Stopped 08/21/14 0434)    Labs Review Labs Reviewed  COMPREHENSIVE METABOLIC PANEL - Abnormal; Notable for the following:    CO2 20 (*)    BUN 57 (*)    Creatinine, Ser 2.15 (*)    ALT 13 (*)    GFR calc non Af Amer 23 (*)    GFR calc Af Amer 26 (*)    All other components within normal limits  CBC WITH DIFFERENTIAL/PLATELET - Abnormal; Notable for the following:    RBC 2.98 (*)    Hemoglobin 9.9 (*)    HCT 31.6 (*)    MCV 106.0 (*)    RDW 17.6 (*)    Platelets 129 (*)    Neutrophils Relative % 87 (*)    Lymphocytes Relative 11 (*)    Lymphs Abs 0.5 (*)    Monocytes Relative 2 (*)    All other components within normal limits  BRAIN NATRIURETIC PEPTIDE - Abnormal; Notable for the following:    B Natriuretic Peptide 1557.0 (*)    All other components within normal limits  CBC - Abnormal; Notable for the following:    RBC  2.50 (*)    Hemoglobin 8.4 (*)    HCT 26.4 (*)    MCV 105.6 (*)    RDW 17.7 (*)    Platelets 96 (*)    All other components within normal limits  PROTIME-INR -  Abnormal; Notable for the following:    Prothrombin Time 17.7 (*)    All other components within normal limits  I-STAT CG4 LACTIC ACID, ED - Abnormal; Notable for the following:    Lactic Acid, Venous 4.52 (*)    All other components within normal limits  I-STAT CG4 LACTIC ACID, ED - Abnormal; Notable for the following:    Lactic Acid, Venous 3.11 (*)    All other components within normal limits  CULTURE, BLOOD (ROUTINE X 2)  CULTURE, BLOOD (ROUTINE X 2)  MRSA PCR SCREENING  URINE CULTURE  TROPONIN I  URINALYSIS, ROUTINE W REFLEX MICROSCOPIC (NOT AT [facility])  LACTIC ACID, PLASMA  PROCALCITONIN  APTT  BASIC METABOLIC PANEL  LACTIC ACID, PLASMA    Imaging Review Dg Chest Port 1 View  08/21/2014   CLINICAL DATA:  Acute onset of generalized body aches, chills, nausea and vomiting. Chest pain. Initial encounter.  EXAM: PORTABLE CHEST - 1 VIEW  COMPARISON:  Chest radiograph performed 03/04/2014  FINDINGS: The lungs are well-aerated. Pulmonary vascularity is at the upper limits of normal. There is no evidence of focal opacification, pleural effusion or pneumothorax.  The cardiomediastinal silhouette is borderline normal in size. There appears to be a mildly displaced left seventh lateral rib fracture.  IMPRESSION: 1. Lungs appear grossly clear. 2. Apparent mildly displaced left seventh lateral rib fracture. Would correlate for associated symptoms.   Electronically Signed   By: Garald Balding M.D.   On: 95/09/3265 12:45   I, Sallee Hogrefe, personally reviewed and evaluated these images and lab results as part of my medical decision-making.   EKG Interpretation   Date/Time:  Monday August 21 2014 00:17:28 EDT Ventricular Rate:  86 PR Interval:  192 QRS Duration: 87 QT Interval:  395 QTC Calculation: 472 R Axis:   77 Text  Interpretation:  Sinus rhythm Atrial premature complex Borderline T  abnormalities, anterior leads When compared with ECG of 05/04/2014, T wave  inversion in the Anterior leads is no longer Present Confirmed by [facility]   MD, Magdala Brahmbhatt ([id number]) on 08/21/2014 8:12:49 AM      CRITICAL CARE Performed by: PJASN,KNLZJ Total critical care time: 45 minutes Critical care time was exclusive of separately billable procedures and treating other patients. Critical care was necessary to treat or prevent imminent or life-threatening deterioration. Critical care was time spent personally by me on the following activities: development of treatment plan with patient and/or surrogate as well as nursing, discussions with consultants, evaluation of patient's response to treatment, examination of patient, obtaining history from patient or surrogate, ordering and performing treatments and interventions, ordering and review of laboratory studies, ordering and review of radiographic studies, pulse oximetry and re-evaluation of patient's condition.  MDM   Final diagnoses:  Fever, unspecified fever cause  Elevated lactic acid level  Renal insufficiency  Macrocytic anemia  Thrombocytopenia  Elevated brain natriuretic peptide (BNP) level    Fever of uncertain cause. Urinary tract infection is suspected. Chest x-ray is unremarkable and laboratory workup does show anemia and renal insufficiency which is not changed from baseline. She was started on IV fluid therapy and to treat possible sepsis and is started on antibody etc. to treat possible urinary tract infection. She was given levofloxacin as treatment because of her [place] and allergy. Lactic acid level has come back moderately elevated at 4.5. Following fluids, it has come down modestly to 3.11. Urinalysis is actually come back showing no evidence of infection. Case was discussed with Dr. Arnoldo Morale of [facility]  who agrees to admit the patient. Discussion was held  about possible antibodies to cover gram-positive organisms but she is allergic to vancomycin. She will be admitted to stepdown unit.  I personally performed the services described in this documentation, which was scribed in my presence. The recorded information has been reviewed and is accurate.     Catherine Fuel, MD [id number]

## 2014-08-21 NOTE — ED Notes (Signed)
Pt c/o sob, generalized weakness that started Friday, vomiting that started this evening, pain all over,

## 2014-08-22 LAB — HEMOGLOBIN AND HEMATOCRIT, BLOOD
HCT: 28.2 % — ABNORMAL LOW (ref 36.0–46.0)
Hemoglobin: 9.2 g/dL — ABNORMAL LOW (ref 12.0–15.0)

## 2014-08-22 LAB — BASIC METABOLIC PANEL
Anion gap: 8 (ref 5–15)
BUN: 51 mg/dL — AB (ref 6–20)
CO2: 16 mmol/L — ABNORMAL LOW (ref 22–32)
CREATININE: 1.95 mg/dL — AB (ref 0.44–1.00)
Calcium: 7.4 mg/dL — ABNORMAL LOW (ref 8.9–10.3)
Chloride: 110 mmol/L (ref 101–111)
GFR calc Af Amer: 29 mL/min — ABNORMAL LOW (ref >60)
GFR, EST NON AFRICAN AMERICAN: 25 mL/min — AB (ref >60)
GLUCOSE: 61 mg/dL — AB (ref 65–99)
Potassium: 3.9 mmol/L (ref 3.5–5.1)
SODIUM: 134 mmol/L — AB (ref 135–145)

## 2014-08-22 LAB — CBC
HCT: 20.8 % — ABNORMAL LOW (ref 36.0–46.0)
Hemoglobin: 6.7 g/dL — CL (ref 12.0–15.0)
MCH: 33.5 pg (ref 26.0–34.0)
MCHC: 32.2 g/dL (ref 30.0–36.0)
MCV: 104 fL — AB (ref 78.0–100.0)
PLATELETS: 71 10*3/uL — AB (ref 150–400)
RBC: 2 MIL/uL — ABNORMAL LOW (ref 3.87–5.11)
RDW: 18.5 % — AB (ref 11.5–15.5)
WBC: 5.5 10*3/uL (ref 4.0–10.5)

## 2014-08-22 LAB — PREPARE RBC (CROSSMATCH)

## 2014-08-22 LAB — ABO/RH: ABO/RH(D): A POS

## 2014-08-22 MED ORDER — SODIUM CHLORIDE 0.9 % IV SOLN: 1000.0000 mL | INTRAVENOUS | Status: DC

## 2014-08-22 MED ORDER — SODIUM CHLORIDE 0.9 % IV SOLN
Freq: Once | INTRAVENOUS | Status: AC
  Administered 2014-08-22: 13:00:00 via INTRAVENOUS

## 2014-08-22 MED ORDER — SODIUM CHLORIDE 0.9 % IV SOLN: Freq: Once | INTRAVENOUS | Status: DC

## 2014-08-22 NOTE — Progress Notes (Signed)
Lab notified that pt's hgb is 6.7. MD notified. Order given to [facility] and to transfuse 1unit of PRBC.

## 2014-08-22 NOTE — Progress Notes (Signed)
[facility] PROGRESS NOTE  Catherine Mccullough TIR:[id number] DOB: [birth year] DOA: 08/21/2014 PCP: Default, Provider, MD   Brief history of present illness:  Patient is a 69 year old woman with a history of metastatic breast cancer, pulmonary valve insufficiency, chronic kidney disease who presented to the hospital on 08/21/2014 with complaints of fever and chills. She was found to be in septic shock source of which is not immediately apparent. Cultures are pending. 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 On 8/16, hemoglobin is 6.7 will be transfused 2 units. Cardiology has been consulted given severe pulmonary valve insufficiency and propensity for volume overload but as of yet thankfully is not displaying any signs of overload.   Assessment/Plan: Septic shock -Source remains unclear, chest x-ray, UA negative. Has a history of MRSA bacteremia from a septic knee arthritis in 2013. -Continue Zyvox, Aztreonam pending culture data. -Blood pressures improved. -We'll receive transfusion today for hemoglobin of 6.7.  Pulmonary valve insufficiency -ECHO with at least moderate regurgitation. -Has TEE scheduled as an OP at [facility]; no need to perform urgently inpatient at present. -Has not developed signs of fluid overload despite receiving IVF for septic shock.  Anemia -Hemoglobin 6.7 today, will transfuse 2 units of PRBCs. -Suspect hemodilution from aggressive fluid resuscitation.   Stage IV chronic kidney disease -Baseline creatinine appears to be between 1.9 and 2.5. -At baseline today (1.95).  Metastatic breast cancer -Remote, continue outpatient follow-up with oncologist as scheduled.  Code Status: Full code Family Communication: Patient only, questions answered  Disposition Plan: Keep in ICU  today   Consultants:  Cardiology   Antibiotics:  Zyvox  Aztreonam   Subjective: Feels well, complains that her nasal cannula oxygen is bothering her and would like something for her allergies. Denies chest pain or shortness of breath.  Objective: Filed Vitals:   08/22/14 0400 08/22/14 0500 08/22/14 0600 08/22/14 0729  BP: 104/43 105/40 102/42   Pulse: 78 78 77   Temp: 99.3 F (37.4 C)   98.3 F (36.8 C)  TempSrc: Oral   Oral  Resp: 15 13 12    Height:      Weight:   62.8 kg (138 lb 7.2 oz)   SpO2: 100% 100% 100%     Intake/Output Summary (Last 24 hours) at 08/22/14 0907 Last data filed at 08/22/14 0500  Gross per 24 hour  Intake 3267.92 ml  Output    600 ml  Net 2667.92 ml   Filed Weights   08/21/14 0008 08/21/14 0457 08/22/14 0600  Weight: 56.7 kg (125 lb) 61.8 kg (136 lb 3.9 oz) 62.8 kg (138 lb 7.2 oz)    Exam:   General:  Alert, awake, oriented 3, no distress  Cardiovascular: Regular rate and rhythm, diastolic murmur  Respiratory: Clear to auscultation bilaterally  Abdomen: Soft, nontender, nondistended, positive bowel sounds  Extremities: Trace bilateral edema, positive pedal pulses   Neurologic:  Grossly intact and nonfocal  Data Reviewed: Basic Metabolic Panel:  Recent Labs Lab 08/21/14 0029 08/21/14 0709 08/22/14 0410  NA 137 136 134*  K 4.7 4.7 3.9  CL 103 108 110  CO2 20* 17* 16*  GLUCOSE 72 69 61*  BUN 57* 54* 51*  CREATININE 2.15* 2.06* 1.95*  CALCIUM 10.1 8.5* 7.4*   Liver Function Tests:  Recent Labs Lab 08/21/14 0029  AST 29  ALT 13*  ALKPHOS 60  BILITOT 0.5  PROT 7.8  ALBUMIN 4.3   No results for input(s): LIPASE, AMYLASE in the last 168 hours. No results for input(s): AMMONIA in the last 168 hours. CBC:  Recent Labs Lab 08/21/14 0029 08/21/14 0709 08/22/14 0612  WBC 4.6 6.0 5.5  NEUTROABS 4.0  --   --   HGB 9.9* 8.4* 6.7*  HCT 31.6* 26.4* 20.8*  MCV 106.0* 105.6* 104.0*  PLT 129* 96* 71*   Cardiac  Enzymes:  Recent Labs Lab 08/21/14 0029  TROPONINI <0.03   BNP (last 3 results)  Recent Labs  03/04/14 0726 08/21/14 0029  BNP 1040.0* 1557.0*    ProBNP (last 3 results)  Recent Labs  11/03/13 1642  PROBNP 6043.0*    CBG: No results for input(s): GLUCAP in the last 168 hours.  Recent Results (from the past 240 hour(s))  Culture, blood (routine x 2)     Status: None (Preliminary result)   Collection Time: 08/21/14 12:55 AM  Result Value Ref Range Status   Specimen Description BLOOD RIGHT HAND  Final   Special Requests BOTTLES DRAWN AEROBIC ONLY 6CC  Final   Culture NO GROWTH < 12 HOURS  Final   Report Status PENDING  Incomplete  Culture, blood (routine x 2)     Status: None (Preliminary result)   Collection Time: 08/21/14  1:10 AM  Result Value Ref Range Status   Specimen Description BLOOD RIGHT HAND  Final   Special Requests BOTTLES DRAWN AEROBIC ONLY 6CC  Final   Culture NO GROWTH < 12 HOURS  Final   Report Status PENDING  Incomplete  Urine culture     Status: None (Preliminary result)   Collection Time: 08/21/14  1:51 AM  Result Value Ref Range Status   Specimen Description URINE, CATHETERIZED  Final   Special Requests NONE  Final   Culture   Final    NO GROWTH < 24 HOURS Performed at [facility]    Report Status PENDING  Incomplete  MRSA PCR Screening     Status: None   Collection Time: 08/21/14  4:40 AM  Result Value Ref Range Status   MRSA by PCR NEGATIVE NEGATIVE Final     Studies: Dg Chest Port 1 View  08/21/2014   CLINICAL DATA:  Acute onset of generalized body aches, chills, nausea and vomiting. Chest pain. Initial encounter.  EXAM: PORTABLE CHEST - 1 VIEW  COMPARISON:  Chest radiograph performed 03/04/2014  FINDINGS: The lungs are well-aerated. Pulmonary vascularity is at the upper limits of normal. There is no evidence of focal opacification, pleural effusion or pneumothorax.  The cardiomediastinal silhouette is borderline normal in size.  There appears to be a mildly displaced left seventh lateral rib fracture.  IMPRESSION: 1. Lungs appear grossly clear. 2. Apparent mildly displaced left seventh lateral rib fracture. Would correlate for associated symptoms.   Electronically Signed   By: Garald Balding M.D.   On: 08/21/2014 01:42    Scheduled Meds: . sodium chloride   Intravenous Once  . sodium chloride   Intravenous Once  . aztreonam  1 g Intravenous Q8H  . citalopram  10 mg Oral Daily  . clonazePAM  0.5 mg Oral BID  . docusate sodium  100 mg Oral BID  . enoxaparin (LOVENOX) injection  30 mg Subcutaneous Q24H  . exemestane  25 mg Oral Daily  . linezolid  600 mg Oral Q12H  . magnesium oxide  400 mg Oral Daily  . montelukast  10  mg Oral QHS  . pantoprazole  40 mg Oral Daily  . pregabalin  75 mg Oral TID  . sodium bicarbonate  650 mg Oral BID  . sodium chloride  3 mL Intravenous Q12H   Continuous Infusions: . sodium chloride      Principal Problem:   Sepsis Active Problems:   Breast cancer metastasized to bone   Chronic pain   Right heart failure   CKD (chronic kidney disease) stage 4, GFR 15-29 ml/min   DM type 2 (diabetes mellitus, type 2)   Pyrexia   Hypotension   Fracture five ribs-closed   Pulmonary insufficiency    Time spent: 45 minutes. Greater than 50% of this time was spent in direct contact with the patient coordinating care.    Lelon Frohlich  [facility] Pager [phone]  If 7PM-7AM, please contact night-coverage at [email or web address], password [facility] 08/22/2014, 9:07 AM  LOS: 1 day

## 2014-08-22 NOTE — Progress Notes (Signed)
Lab notified that

## 2014-08-22 NOTE — Progress Notes (Signed)
Hgb is 6.7 this am, ordered 1 unit PRBC transfusion.

## 2014-08-22 NOTE — Progress Notes (Addendum)
Subjective:    Energy is improving.   Objective:   Temp:  [98.3 F (36.8 C)-99.9 F (37.7 C)] 98.3 F (36.8 C) (08/16 0729) Pulse Rate:  [74-85] 77 (08/16 0600) Resp:  [12-30] 12 (08/16 0600) BP: (81-122)/(35-89) 102/42 mmHg (08/16 0600) SpO2:  [86 %-100 %] 100 % (08/16 0600) Weight:  [138 lb 7.2 oz (62.8 kg)] 138 lb 7.2 oz (62.8 kg) (08/16 0600)    Filed Weights   08/21/14 0008 08/21/14 0457 08/22/14 0600  Weight: 125 lb (56.7 kg) 136 lb 3.9 oz (61.8 kg) 138 lb 7.2 oz (62.8 kg)    Intake/Output Summary (Last 24 hours) at 08/22/14 0839 Last data filed at 08/22/14 0500  Gross per 24 hour  Intake 3267.92 ml  Output    600 ml  Net 2667.92 ml    Telemetry: SR  Exam:  General: NAD  Resp: CTAB  Cardiac: RRR, 2/6 diastolic murmur LUSB, no JVD  GI: abdomen soft, NT, ND  MSK: no LE edema  Neuro: no focal deficits  Psych: appropriate affect  Lab Results:  Basic Metabolic Panel:  Recent Labs Lab 08/21/14 0029 08/21/14 0709 08/22/14 0410  NA 137 136 134*  K 4.7 4.7 3.9  CL 103 108 110  CO2 20* 17* 16*  GLUCOSE 72 69 61*  BUN 57* 54* 51*  CREATININE 2.15* 2.06* 1.95*  CALCIUM 10.1 8.5* 7.4*    Liver Function Tests:  Recent Labs Lab 08/21/14 0029  AST 29  ALT 13*  ALKPHOS 60  BILITOT 0.5  PROT 7.8  ALBUMIN 4.3    CBC:  Recent Labs Lab 08/21/14 0029 08/21/14 0709 08/22/14 0612  WBC 4.6 6.0 5.5  HGB 9.9* 8.4* 6.7*  HCT 31.6* 26.4* 20.8*  MCV 106.0* 105.6* 104.0*  PLT 129* 96* 71*    Cardiac Enzymes:  Recent Labs Lab 08/21/14 0029  TROPONINI <0.03    BNP:  Recent Labs  11/03/13 1642  PROBNP 6043.0*    Coagulation:  Recent Labs Lab 08/21/14 0709  INR 1.45    ECG:   Medications:   Scheduled Medications: . sodium chloride   Intravenous Once  . sodium chloride   Intravenous Once  . aztreonam  1 g Intravenous Q8H  . bumetanide  1 mg Oral BID  . citalopram  10 mg Oral Daily  . clonazePAM  0.5 mg Oral BID    . docusate sodium  100 mg Oral BID  . enoxaparin (LOVENOX) injection  30 mg Subcutaneous Q24H  . exemestane  25 mg Oral Daily  . linezolid  600 mg Oral Q12H  . magnesium oxide  400 mg Oral Daily  . montelukast  10 mg Oral QHS  . pantoprazole  40 mg Oral Daily  . pregabalin  75 mg Oral TID  . sodium bicarbonate  650 mg Oral BID  . sodium chloride  3 mL Intravenous Q12H     Infusions: . sodium chloride 1,000 mL (08/22/14 0500)     PRN Medications:  sodium chloride, acetaminophen **OR** acetaminophen, alum & mag hydroxide-simeth, HYDROmorphone (DILAUDID) injection, ondansetron **OR** ondansetron (ZOFRAN) IV, oxyCODONE, polyethylene glycol, sodium chloride     Assessment/Plan    1. Septic shock - unclear source, cultures remain negative. Abx per primary team and ID - no pressor requirement, bp maintained with IVFs - hold oral bp meds. Stop her IV bumex, needs volume expansion in setting of vasodilatation/septic shock   2. Pulmonic insufficiency - patient being worked up for [employer] at [facility] -  echo here shows limited views of the pulmonic valve, there is at least moderate regurgitation. RV is dilated with low normal function, normal LV systolic function. She is planned for TEE at [facility] at some point this month.  - has required IVFs given her septic shock, has not developed any signs of fluid overload.  - based on echo there is no cardiac etiology for her hypotension, appears all to be vasodilatory/septic shock.  - we will stop her bumex  3. Anemia - management per primary team, significant drop down to 6.7.  - follow volume status with transfusions. With transfusion volume will cut back IVF to 75 mL/hr for now   4. CKD - Cr trending down, home diuretics on hold in setting of hypotension   Carlyle Dolly, M.D.

## 2014-08-23 DIAGNOSIS — R6521 Severe sepsis with septic shock: Secondary | ICD-10-CM

## 2014-08-23 DIAGNOSIS — N184 Chronic kidney disease, stage 4 (severe): Secondary | ICD-10-CM

## 2014-08-23 DIAGNOSIS — N179 Acute kidney failure, unspecified: Secondary | ICD-10-CM | POA: Insufficient documentation

## 2014-08-23 DIAGNOSIS — D696 Thrombocytopenia, unspecified: Secondary | ICD-10-CM

## 2014-08-23 LAB — TYPE AND SCREEN
ABO/RH(D): A POS
Antibody Screen: NEGATIVE
UNIT DIVISION: 0
UNIT DIVISION: 0

## 2014-08-23 LAB — URINE CULTURE: CULTURE: NO GROWTH

## 2014-08-23 LAB — BASIC METABOLIC PANEL
Anion gap: 8 (ref 5–15)
BUN: 45 mg/dL — AB (ref 6–20)
CHLORIDE: 112 mmol/L — AB (ref 101–111)
CO2: 16 mmol/L — AB (ref 22–32)
CREATININE: 1.72 mg/dL — AB (ref 0.44–1.00)
Calcium: 7.5 mg/dL — ABNORMAL LOW (ref 8.9–10.3)
GFR calc non Af Amer: 30 mL/min — ABNORMAL LOW (ref >60)
GFR, EST AFRICAN AMERICAN: 34 mL/min — AB (ref >60)
Glucose, Bld: 88 mg/dL (ref 65–99)
Potassium: 3.7 mmol/L (ref 3.5–5.1)
Sodium: 136 mmol/L (ref 135–145)

## 2014-08-23 LAB — CBC
HCT: 27.5 % — ABNORMAL LOW (ref 36.0–46.0)
Hemoglobin: 9.1 g/dL — ABNORMAL LOW (ref 12.0–15.0)
MCH: 32.5 pg (ref 26.0–34.0)
MCHC: 33.1 g/dL (ref 30.0–36.0)
MCV: 98.2 fL (ref 78.0–100.0)
PLATELETS: 58 10*3/uL — AB (ref 150–400)
RBC: 2.8 MIL/uL — AB (ref 3.87–5.11)
RDW: 21 % — ABNORMAL HIGH (ref 11.5–15.5)
WBC: 5.9 10*3/uL (ref 4.0–10.5)

## 2014-08-23 MED ORDER — PREGABALIN 75 MG PO CAPS
75.0000 mg | ORAL_CAPSULE | Freq: Two times a day (BID) | ORAL | Status: DC
  Administered 2014-08-23 – 2014-08-27 (×9): 75 mg via ORAL
  Filled 2014-08-23 (×9): qty 1

## 2014-08-23 NOTE — Progress Notes (Signed)
Patient ID: Catherine Mccullough, female   DOB: 1945-10-14, 69 y.o.   MRN: [id number]     Subjective:    Energy improving. No SOB.   Objective:   Temp:  [97.7 F (36.5 C)-99 F (37.2 C)] 98 F (36.7 C) (08/17 0808) Pulse Rate:  [73-80] 74 (08/17 0700) Resp:  [12-20] 12 (08/17 0700) BP: (92-127)/(37-86) 122/86 mmHg (08/17 0700) SpO2:  [95 %-100 %] 96 % (08/17 0700) FiO2 (%):  [24 %] 24 % (08/16 2100)    Filed Weights   08/21/14 0008 08/21/14 0457 08/22/14 0600  Weight: 125 lb (56.7 kg) 136 lb 3.9 oz (61.8 kg) 138 lb 7.2 oz (62.8 kg)    Intake/Output Summary (Last 24 hours) at 08/23/14 0905 Last data filed at 08/23/14 0700  Gross per 24 hour  Intake   2461 ml  Output    550 ml  Net   1911 ml    Telemetry: NSR  Exam:  General: NAD  Resp: CTAB  Cardiac: RRR, 2/6 diastolic murmur LUSB, nO JVD  GI: abdomen soft, NT, ND  MSK: no LE edema  Neuro: no focal deficits  Psych: appropriate affect  Lab Results:  Basic Metabolic Panel:  Recent Labs Lab 08/21/14 0709 08/22/14 0410 08/23/14 0439  NA 136 134* 136  K 4.7 3.9 3.7  CL 108 110 112*  CO2 17* 16* 16*  GLUCOSE 69 61* 88  BUN 54* 51* 45*  CREATININE 2.06* 1.95* 1.72*  CALCIUM 8.5* 7.4* 7.5*    Liver Function Tests:  Recent Labs Lab 08/21/14 0029  AST 29  ALT 13*  ALKPHOS 60  BILITOT 0.5  PROT 7.8  ALBUMIN 4.3    CBC:  Recent Labs Lab 08/21/14 0709 08/22/14 0612 08/22/14 1855 08/23/14 0439  WBC 6.0 5.5  --  5.9  HGB 8.4* 6.7* 9.2* 9.1*  HCT 26.4* 20.8* 28.2* 27.5*  MCV 105.6* 104.0*  --  98.2  PLT 96* 71*  --  58*    Cardiac Enzymes:  Recent Labs Lab 08/21/14 0029  TROPONINI <0.03    BNP:  Recent Labs  11/03/13 1642  PROBNP 6043.0*    Coagulation:  Recent Labs Lab 08/21/14 0709  INR 1.45    ECG:   Medications:   Scheduled Medications: . sodium chloride   Intravenous Once  . aztreonam  1 g Intravenous Q8H  . clonazePAM  0.5 mg Oral BID  . docusate sodium   100 mg Oral BID  . exemestane  25 mg Oral Daily  . linezolid  600 mg Oral Q12H  . magnesium oxide  400 mg Oral Daily  . montelukast  10 mg Oral QHS  . pantoprazole  40 mg Oral Daily  . pregabalin  75 mg Oral BID  . sodium bicarbonate  650 mg Oral BID  . sodium chloride  3 mL Intravenous Q12H     Infusions: . sodium chloride 1,000 mL (08/23/14 0700)     PRN Medications:  sodium chloride, acetaminophen **OR** acetaminophen, alum & mag hydroxide-simeth, HYDROmorphone (DILAUDID) injection, ondansetron **OR** ondansetron (ZOFRAN) IV, oxyCODONE, polyethylene glycol, sodium chloride     Assessment/Plan   1. Septic shock - unclear source, cultures remain negative. Abx per primary team and ID - no pressor requirement, bp maintained with IVFs. BP improved to 120/80s this AM - holding oral bp meds and diuretics. Will stop IVFs.    2. Pulmonic insufficiency - patient being worked up for [place] at [facility] - echo here shows limited views of the  pulmonic valve, there is at least moderate regurgitation. RV is dilated with low normal function, normal LV systolic function. She is planned for TEE at [facility] at some point this month.  - has required IVFs given her septic shock, has not developed any signs of fluid overload.  - based on echo there is no cardiac etiology for her hypotension, appears all to be vasodilatory/septic shock.  - she is + 2.4 liters yesterday, + 5 liters since admission. With now normalized bp will d/c her IVF.   3. Anemia - transfused yesterday by primary team, Hgb 6.7 to 9.1   4. CKD - Cr trending down, home diuretics on hold in setting of hypotension  5. Thrombocytopenia - management per primary team.       Carlyle Dolly, M.D.

## 2014-08-23 NOTE — Progress Notes (Signed)
PROGRESS NOTE  Catherine Mccullough BLT:[id number] DOB: February 22, 1945 DOA: 08/21/2014 PCP: Default, Provider, MD  Dr. Guilford Shi- Cardiac Surgeon  Dr Maree Erie- Cardiologist   Summary: 3 yof with a hx of metastatic breast cancer, DM, CHF and HTN presented with fever and myalgias. She was admitted for a sepsis unclear etiology with hypotension      Assessment/Plan: 1. Septic shock, fever, chills, myalgias, SOB, chest pain, n/v. No nausea or vomiting. SOB improved. CXR and u/a negative for infection. Continues on Zyvox and Aztreonam. H/o MRSA bacteremia 2013 following septic arthritis of knee. Cultures are pending, no evidence of bactremia at this time. No rash no focal symptoms.   2. Hypotension. Resolved. Per cardiology: on echo there is no cardiac etiology for her hypotension, appears all to be vasodilatory/septic shock" 3. AKI. Improving, CKD appears to be at baseline.  4. Thrombocytopenia. Stop Lovenox, monitor clinically, present on admission, seen in past, trending down. Secondary to sepsis.  5. Atypical chest pain. No further evaluation suggested. left seventh rib fracture 6. Severe pulmonary valve insufficiency, pulm HTN, and moderate RV dilatation. Work up done at [facility] . being considered for percutaneous pulmonary valve replacement at [facility]. was scheduled for outpatient TEE at [facility] 8/16. Echo this admssion with normal LV function.  7. Anemia, s/p 2 unit PRBC 8/16. Stable  8. Stage IV CKD, at baseline 9. DM type 2, diet controlled 10. metastatic breast cancer    Overall improved, hemodynamically stable, agree with stopping IVF, continue to hold bp medications   Stop Lovenox  Check CBC and BMP in morning  Transfer to telemetry, possibly home next 48 hours   Code Status: Full DVT prophylaxis: Lovenox Family Communication: pt alone, no questions at this time  Disposition Plan: Transfer to telemetry, possibly home next 48 hours   Murray Hodgkins, MD  [facility]    Pager [phone] If 7PM-7AM, please contact night-coverage at [email or web address], password [facility] 08/23/2014, 7:30 AM  LOS: 2 days   Consultants:  Cardiology   Procedures:  s/p 3 unit PRBC 8/16  Echo Study Conclusions  - Left ventricle: The cavity size was normal. Wall thickness was normal. Systolic function was normal. The estimated ejection fraction was in the range of 60% to 65%. Wall motion was normal; there were no regional wall motion abnormalities. Left ventricular diastolic function parameters were normal. - Aortic valve: Mildly to moderately calcified annulus. Trileaflet; mildly thickened leaflets. Valve area (VTI): 1.6 cm^2. Valve area (Vmax): 1.56 cm^2. - Mitral valve: Mildly calcified annulus. Normal thickness leaflets . - Right ventricle: The cavity size was moderately dilated. - Right atrium: The interatrial septum bows to the left consistent with elevated RA pressure. The atrium was mildly to moderately dilated. - Tricuspid valve: There was mild-moderate regurgitation. The TR vena contracta is 0.3 cm. - Pulmonic valve: Not well visualized. There is at least moderate regurgitation. There is no significant stenosis. - Pulmonary arteries: Systolic pressure was mildly increased. PA peak pressure: 35 mm Hg (S). - Systemic veins: IVC is normal size with decreased respiratory variation. Estimated RA pressure is 8 mmHg. - Technically difficult study.  Antibiotics:  Zyvox 8/15 >>  Aztreonam 8/15 >>   HPI/Subjective: poor sleep, no n/va eating ok. No pain, still a little sob but better   Objective: Filed Vitals:   08/23/14 0400 08/23/14 0500 08/23/14 0600 08/23/14 0700  BP: 115/52 117/49 118/56 122/86  Pulse: 74 74 74 74  Temp: 97.7 F (36.5 C)     TempSrc: Oral  Resp: 16 15 15 12   Height:      Weight:      SpO2: 98% 97% 97% 96%    Intake/Output Summary (Last 24 hours) at 08/23/14 0730 Last data filed at 08/23/14 0700  Gross  per 24 hour  Intake   2961 ml  Output    550 ml  Net   2411 ml     Filed Weights   08/21/14 0008 08/21/14 0457 08/22/14 0600  Weight: 56.7 kg (125 lb) 61.8 kg (136 lb 3.9 oz) 62.8 kg (138 lb 7.2 oz)    Exam:     Afebrile, VSS General:  Appears calm and comfortable Eyes: PERRL, normal lids, irises & conjunctiva ENT: grossly normal hearing, lips & tongue Cardiovascular: RRR, no m/r/g. No LE edema. Telemetry: SR, no arrhythmias  Respiratory: CTA bilaterally, no w/r/r. Normal respiratory effort. Abdomen: mild generalized tenderness, non distended Musculoskeletal: grossly normal tone BUE/BLE Psychiatric: grossly normal mood and affect, speech fluent and appropriate   New data reviewed:  creatinine trending down, 1.72 appears to be baseline  BUN trending down  AG 8, Co2 stable at 16  hgb stable, 9.1  platelets somewhat lower, 58  Pertinent data since admission:    Pending data:    Scheduled Meds: . sodium chloride   Intravenous Once  . aztreonam  1 g Intravenous Q8H  . citalopram  10 mg Oral Daily  . clonazePAM  0.5 mg Oral BID  . docusate sodium  100 mg Oral BID  . enoxaparin (LOVENOX) injection  30 mg Subcutaneous Q24H  . exemestane  25 mg Oral Daily  . linezolid  600 mg Oral Q12H  . magnesium oxide  400 mg Oral Daily  . montelukast  10 mg Oral QHS  . pantoprazole  40 mg Oral Daily  . pregabalin  75 mg Oral TID  . sodium bicarbonate  650 mg Oral BID  . sodium chloride  3 mL Intravenous Q12H   Continuous Infusions: . sodium chloride 1,000 mL (08/23/14 0700)    Principal Problem:   Severe sepsis with septic shock Active Problems:   Breast cancer metastasized to bone   Chronic pain   Right heart failure   Thrombocytopenia   CKD (chronic kidney disease) stage 4, GFR 15-29 ml/min   DM type 2 (diabetes mellitus, type 2)   Fracture five ribs-closed   Pulmonary insufficiency   Time spent 30 minutes  I, Arielle Khosrowpour, acting a scribe,  recorded this note contemporaneously in the presence of Dr. Melene Plan. Sarajane Jews, M.D. on 08/23/2014    I have reviewed the above documentation for accuracy and completeness, and I agree with the above. Murray Hodgkins, MD

## 2014-08-24 DIAGNOSIS — E1122 Type 2 diabetes mellitus with diabetic chronic kidney disease: Secondary | ICD-10-CM

## 2014-08-24 DIAGNOSIS — I509 Heart failure, unspecified: Secondary | ICD-10-CM

## 2014-08-24 DIAGNOSIS — N189 Chronic kidney disease, unspecified: Secondary | ICD-10-CM

## 2014-08-24 LAB — CBC
HEMATOCRIT: 28.9 % — AB (ref 36.0–46.0)
HEMOGLOBIN: 9.4 g/dL — AB (ref 12.0–15.0)
MCH: 32.2 pg (ref 26.0–34.0)
MCHC: 32.5 g/dL (ref 30.0–36.0)
MCV: 99 fL (ref 78.0–100.0)
PLATELETS: 59 10*3/uL — AB (ref 150–400)
RBC: 2.92 MIL/uL — AB (ref 3.87–5.11)
RDW: 20.4 % — ABNORMAL HIGH (ref 11.5–15.5)
WBC: 4.4 10*3/uL (ref 4.0–10.5)

## 2014-08-24 LAB — BASIC METABOLIC PANEL
Anion gap: 5 (ref 5–15)
BUN: 33 mg/dL — AB (ref 6–20)
CHLORIDE: 111 mmol/L (ref 101–111)
CO2: 20 mmol/L — AB (ref 22–32)
Calcium: 7.8 mg/dL — ABNORMAL LOW (ref 8.9–10.3)
Creatinine, Ser: 1.38 mg/dL — ABNORMAL HIGH (ref 0.44–1.00)
GFR calc Af Amer: 45 mL/min — ABNORMAL LOW (ref >60)
GFR calc non Af Amer: 39 mL/min — ABNORMAL LOW (ref >60)
GLUCOSE: 91 mg/dL (ref 65–99)
POTASSIUM: 3.6 mmol/L (ref 3.5–5.1)
Sodium: 136 mmol/L (ref 135–145)

## 2014-08-24 MED ORDER — BUMETANIDE 1 MG PO TABS
1.0000 mg | ORAL_TABLET | Freq: Every day | ORAL | Status: DC
  Administered 2014-08-24 – 2014-08-27 (×4): 1 mg via ORAL
  Filled 2014-08-24 (×4): qty 1

## 2014-08-24 NOTE — Progress Notes (Signed)
Patient ID: Catherine Mccullough, female   DOB: [birth year], 69 y.o.   MRN: [id number]     Subjective:    Feeling better, no complaints this AM  Objective:   Temp:  [98.5 F (36.9 C)-98.6 F (37 C)] 98.6 F (37 C) (08/18 0637) Pulse Rate:  [70-77] 70 (08/18 0637) Resp:  [15-20] 16 (08/18 0637) BP: (121-144)/(45-64) 144/47 mmHg (08/18 0637) SpO2:  [97 %-99 %] 97 % (08/18 0637) Last BM Date: 08/23/14  Filed Weights   08/21/14 0008 08/21/14 0457 08/22/14 0600  Weight: 125 lb (56.7 kg) 136 lb 3.9 oz (61.8 kg) 138 lb 7.2 oz (62.8 kg)    Intake/Output Summary (Last 24 hours) at 08/24/14 0901 Last data filed at 08/23/14 1800  Gross per 24 hour  Intake      0 ml  Output      0 ml  Net      0 ml    Telemetry: NSR  Exam:  General: NAD  HEENT:sclera clear  Resp: CTAB  Cardiac: RRR, 2/6 diastolic murmur LUSB, no JVD  GI: abdomen soft, NT, ND  MSK: no LE edema  Neuro: no focal deficits   Lab Results:  Basic Metabolic Panel:  Recent Labs Lab 08/22/14 0410 08/23/14 0439 08/24/14 0548  NA 134* 136 136  K 3.9 3.7 3.6  CL 110 112* 111  CO2 16* 16* 20*  GLUCOSE 61* 88 91  BUN 51* 45* 33*  CREATININE 1.95* 1.72* 1.38*  CALCIUM 7.4* 7.5* 7.8*    Liver Function Tests:  Recent Labs Lab 08/21/14 0029  AST 29  ALT 13*  ALKPHOS 60  BILITOT 0.5  PROT 7.8  ALBUMIN 4.3    CBC:  Recent Labs Lab 08/22/14 0612 08/22/14 1855 08/23/14 0439 08/24/14 0548  WBC 5.5  --  5.9 4.4  HGB 6.7* 9.2* 9.1* 9.4*  HCT 20.8* 28.2* 27.5* 28.9*  MCV 104.0*  --  98.2 99.0  PLT 71*  --  58* 59*    Cardiac Enzymes:  Recent Labs Lab 08/21/14 0029  TROPONINI <0.03    BNP:  Recent Labs  11/03/13 1642  PROBNP 6043.0*    Coagulation:  Recent Labs Lab 08/21/14 0709  INR 1.45    ECG:   Medications:   Scheduled Medications: . aztreonam  1 g Intravenous Q8H  . clonazePAM  0.5 mg Oral BID  . docusate sodium  100 mg Oral BID  . exemestane  25 mg Oral Daily    . linezolid  600 mg Oral Q12H  . magnesium oxide  400 mg Oral Daily  . montelukast  10 mg Oral QHS  . pantoprazole  40 mg Oral Daily  . pregabalin  75 mg Oral BID  . sodium bicarbonate  650 mg Oral BID  . sodium chloride  3 mL Intravenous Q12H     Infusions:     PRN Medications:  sodium chloride, acetaminophen **OR** acetaminophen, alum & mag hydroxide-simeth, ondansetron **OR** ondansetron (ZOFRAN) IV, oxyCODONE, polyethylene glycol, sodium chloride     Assessment/Plan    1. Septic shock - unclear source, cultures remain negative. Abx per primary team and ID - no pressor requirement,bp responded to IVF fluids alone.  - holding oral bp meds. Will resume her home bumex but just once daily to start.    2. Pulmonic insufficiency - patient being worked up for [place] at [facility] - echo here shows limited views of the pulmonic valve, there is at least moderate regurgitation. RV is dilated with  low normal function, normal LV systolic function. She is planned for TEE at [facility] at some point this month.  - has required IVFs given her septic shock, has not developed any signs of fluid overload.  - based on echo there is no cardiac etiology for her hypotension, appears all to be vasodilatory/septic shock.  - will reorder strict I/Os, not documented yesterday - start bumex once daily, likely back to her home bid dosing tomorrow.   3. Anemia - transfused yesterday by primary team, Hgb 6.7 to 9.1   4. AKI on CKD - Cr trending down, follow as we retart bumex. Historically renal function very labile rangint 1.2 to 3.   5. Thrombocytopenia - management per primary team.       Carlyle Dolly, M.D., F.A.C.C.

## 2014-08-24 NOTE — Evaluation (Signed)
Physical Therapy Evaluation Patient Details Name: Catherine Mccullough MRN: [id number] DOB: [birth year] Today's Date: 08/24/2014   History of Present Illness  HPI: Catherine Mccullough is a 69 y.o. female with a history of Metastatic Breast Cancer (1989, 1999) S/P Chemo and Radiation Rx, CHF, HTN, DM2 and CKD who presents to the ED with complaints of Fevers, Chills, Myalgias, SOB and Chest pain with Nausea and Vomiting x 1 day. She reports having 10/10 Chest Pain in the mid chest area. She denies any cough or dysuria. She was found to have a temperature of 100.6 in the ED. A Sepsis workup was initiated. Her sepsis as yet has no known etiology.  Clinical Impression  Pt was seen for evaluation.  She was alert and oriented, able to follow all directions.  Pt lives alone in a retirement apartment complex and is normally independent with the use of a walker outside of the home.  She has a leg length discrepancy and wears a lift on the right shoe.  She also uses a 4 wheeled walker for any gait distance.  She refuses to use our 2 wheeled walker which is the only one available in the hospital.  She is found to be mildly deconditioned from her baseline and she currently needs hand held assist to ambulate 15'.  We have asked her to have her son bring in her shoes and rollator so that we can assist her with ambulating further distances while in the hospital.    Follow Up Recommendations Home health PT    Equipment Recommendations  None recommended by PT    Recommendations for Other Services  none     Precautions / Restrictions Precautions Precautions: Fall Restrictions Weight Bearing Restrictions: No      Mobility  Bed Mobility Overal bed mobility: Modified Independent                Transfers Overall transfer level: Needs assistance Equipment used: 1 person hand held assist Transfers: Sit to/from Stand Sit to Stand: Supervision            Ambulation/Gait Ambulation/Gait  assistance: Min assist Ambulation Distance (Feet): 15 Feet (x 2 laps) Assistive device: 1 person hand held assist Gait Pattern/deviations: WFL(Within Functional Limits)   Gait velocity interpretation: at or above normal speed for age/gender General Gait Details: gait pattern altered slightly due to leg length discrepancy  Stairs            Wheelchair Mobility    Modified Rankin (Stroke Patients Only)       Balance Overall balance assessment: No apparent balance deficits (not formally assessed)                                           Pertinent Vitals/Pain Pain Assessment: No/denies pain    Home Living Family/patient expects to be discharged to:: Private residence Living Arrangements: Alone Available Help at Discharge: Family;Neighbor;Available PRN/intermittently Type of Home: Apartment Home Access: Elevator     Home Layout: One level Home Equipment: Walker - 4 wheels;Cane - single point;Bedside commode;Shower seat;Grab bars - tub/shower      Prior Function Level of Independence: Independent with assistive device(s)         Comments: uses a walker when outside of the home, no assistive device inside of the home     Hand Dominance   Dominant Hand: Right    Extremity/Trunk Assessment  Lower Extremity Assessment: Generalized weakness (RLE shorter than LLE, wears a lift on right shoe)         Communication   Communication: No difficulties  Cognition Arousal/Alertness: Awake/alert Behavior During Therapy: WFL for tasks assessed/performed Overall Cognitive Status: Within Functional Limits for tasks assessed                      General Comments      Exercises        Assessment/Plan    PT Assessment Patient needs continued PT services  PT Diagnosis Difficulty walking;Generalized weakness   PT Problem List Decreased strength;Decreased activity tolerance;Decreased mobility;Cardiopulmonary status  limiting activity  PT Treatment Interventions Gait training;Therapeutic exercise   PT Goals (Current goals can be found in the Care Plan section) Acute Rehab PT Goals Patient Stated Goal: return home to independence PT Goal Formulation: With patient Time For Goal Achievement: 09/07/14 Potential to Achieve Goals: Good    Frequency Min 3X/week   Barriers to discharge Decreased caregiver support pt has family and neighbors who can help out per her report    Co-evaluation               End of Session Equipment Utilized During Treatment: Gait belt Activity Tolerance: Patient tolerated treatment well Patient left: in chair;with call bell/phone within reach;with nursing/sitter in room Nurse Communication: Mobility status         Time: 1133-1202 PT Time Calculation (min) (ACUTE ONLY): 29 min   Charges:   PT Evaluation $Initial PT Evaluation Tier I: 1 Procedure     PT G CodesOwens Shark, Beverlie Kurihara L  PT 08/24/2014, 12:12 PM [phone]

## 2014-08-24 NOTE — Progress Notes (Signed)
PROGRESS NOTE  Catherine Mccullough ZOX:[id number] DOB: Nov 25, 1945 DOA: 08/21/2014 PCP: Default, Provider, MD  Dr. Guilford Shi- Cardiac Surgeon  Dr Maree Erie- Cardiologist   Summary: 52 yof with a hx of metastatic breast cancer, DM, CHF and HTN presented with fever and myalgias. She was admitted for a sepsis unclear etiology with hypotension      Assessment/Plan: 1. Septic shock. Hemodynamically stable, sob improved. No nausea or vomiting. CXR and u/a negative for infection. Continues on Zyvox and Aztreonam. H/o MRSA bacteremia 2013 following septic arthritis of knee. Urine culture showed no growth, blood culture is pending 2. Hypotension. Resolved, hypertensive. Not cardiogenic. Per cardiology: "on echo there is no cardiac etiology for her hypotension, appears all to be vasodilatory/septic shock. Hold home diuretics  3. AKI. Appears resolved. CKD at baseline. Stage IV CKD, at baseline 4. Thrombocytopenia. Stablizing. Lovenox stopped, secondary to sepsis. Present on admission,  5. Atypical chest pain. No further evaluation suggested. left seventh rib fracture 6. Severe pulmonary valve insufficiency, pulm HTN, and moderate RV dilatation. Work up done at [facility] . being considered for percutaneous pulmonary valve replacement at [facility] was scheduled for outpatient TEE at [facility] 8/16. Echo this admssion with normal LV function.  7. Anemia, s/p 2 unit PRBC 8/16. Stable  8. DM type 2, diet controlled 9. Metastatic breast cancer    Continues to improve, agree with restarting diuretic per cardiology  BMP in AM  Code Status: Full DVT prophylaxis: Lovenox Family Communication: pt alone, no questions at this time  Disposition Plan: Possibly home within the next 24-48 hours   Catherine Hodgkins, MD  [facility]  Pager [phone] If 7PM-7AM, please contact night-coverage at [email or web address], password [facility] 08/24/2014, 7:04 AM  LOS: 3 days   Consultants:  Cardiology  PT - placed on fall  precautions. Recommended HH PT   Procedures:  s/p 3 unit PRBC 8/16  Echo Study Conclusions  - Left ventricle: The cavity size was normal. Wall thickness was normal. Systolic function was normal. The estimated ejection fraction was in the range of 60% to 65%. Wall motion was normal; there were no regional wall motion abnormalities. Left ventricular diastolic function parameters were normal. - Aortic valve: Mildly to moderately calcified annulus. Trileaflet; mildly thickened leaflets. Valve area (VTI): 1.6 cm^2. Valve area (Vmax): 1.56 cm^2. - Mitral valve: Mildly calcified annulus. Normal thickness leaflets . - Right ventricle: The cavity size was moderately dilated. - Right atrium: The interatrial septum bows to the left consistent with elevated RA pressure. The atrium was mildly to moderately dilated. - Tricuspid valve: There was mild-moderate regurgitation. The TR vena contracta is 0.3 cm. - Pulmonic valve: Not well visualized. There is at least moderate regurgitation. There is no significant stenosis. - Pulmonary arteries: Systolic pressure was mildly increased. PA peak pressure: 35 mm Hg (S). - Systemic veins: IVC is normal size with decreased respiratory variation. Estimated RA pressure is 8 mmHg. - Technically difficult study.  Antibiotics:  Zyvox 8/15 >>  Aztreonam 8/15 >>   HPI/Subjective: Feeling better than yesterday. Breathing is getting better. Eating okay, no n/v.   Objective: Filed Vitals:   08/23/14 1100 08/23/14 1200 08/23/14 2027 08/24/14 0637  BP: 122/46 121/45 143/64 144/47  Pulse: 77 77 73 70  Temp:   98.5 F (36.9 C) 98.6 F (37 C)  TempSrc:   Oral Oral  Resp: 17 15 20 16   Height:      Weight:      SpO2: 98% 97% 99% 97%    Intake/Output  Summary (Last 24 hours) at 08/24/14 0704 Last data filed at 08/23/14 1800  Gross per 24 hour  Intake      0 ml  Output      0 ml  Net      0 ml     Filed Weights    08/21/14 0008 08/21/14 0457 08/22/14 0600  Weight: 56.7 kg (125 lb) 61.8 kg (136 lb 3.9 oz) 62.8 kg (138 lb 7.2 oz)    Exam:     Afebrile, no hypoxia  General:  Appears comfortable, calm. Appears better, sitting up in chair.  Cardiovascular: Regular rate and rhythm, no murmur, rub or gallop. No lower extremity edema. Telemetry: Sinus rhythm, no arrhythmias  Respiratory: Clear to auscultation bilaterally, no wheezes, rales or rhonchi. Normal respiratory effort. Skin: no rash or induration  Musculoskeletal: grossly normal tone bilateral upper and lower extremities Psychiatric: grossly normal mood and affect, speech fluent and appropriate Neurologic: grossly non-focal.    New data reviewed:  BUN and Creatinine improved  Hgb stable, 9.4. Platelet count stable 59  Pertinent data since admission:    Pending data:    Scheduled Meds: . aztreonam  1 g Intravenous Q8H  . clonazePAM  0.5 mg Oral BID  . docusate sodium  100 mg Oral BID  . exemestane  25 mg Oral Daily  . linezolid  600 mg Oral Q12H  . magnesium oxide  400 mg Oral Daily  . montelukast  10 mg Oral QHS  . pantoprazole  40 mg Oral Daily  . pregabalin  75 mg Oral BID  . sodium bicarbonate  650 mg Oral BID  . sodium chloride  3 mL Intravenous Q12H   Continuous Infusions:    Principal Problem:   Severe sepsis with septic shock Active Problems:   Breast cancer metastasized to bone   Chronic pain   Right heart failure   Thrombocytopenia   CKD (chronic kidney disease) stage 4, GFR 15-29 ml/min   DM type 2 (diabetes mellitus, type 2)   Fracture five ribs-closed   Pulmonary insufficiency   AKI (acute kidney injury)   Time spent 20 minutes  I, Catherine Mccullough, acting a scribe, recorded this note contemporaneously in the presence of Dr. Melene Plan. Catherine Mccullough, M.D. on 08/24/2014   I have reviewed the above documentation for accuracy and completeness, and I agree with the above. Catherine Hodgkins, MD

## 2014-08-24 NOTE — Plan of Care (Signed)
Problem: Phase I Progression Outcomes Goal: Pain controlled with appropriate interventions Outcome: Progressing Pt still requires pain medicine to control pain. Goal: OOB as tolerated unless otherwise ordered Outcome: Progressing Pt has been walking back and forth to the bathroom. Pt's gait is still unsteady and requires a gait belt and two assist. Goal: Hemodynamically stable Outcome: Progressing See flowsheet

## 2014-08-24 NOTE — Progress Notes (Signed)
ANTIBIOTIC CONSULT NOTE - follow up  Pharmacy Consult for Aztreonam Indication: rule out sepsis  Allergies  Allergen Reactions  . Mometasone Shortness Of Breath  . Vancomycin Itching  . Aspirin Other (See Comments)    "Inflames stomach"  . Contrast Media [Iodinated Diagnostic Agents] Other (See Comments)    Made Heart Stop.   . Cortisone Other (See Comments)    Hold fluid  . Doxycycline Nausea And Vomiting  . Ibuprofen Nausea And Vomiting  . Insulins Other (See Comments)    Pt says even the tiniest bit of insulin makes her go unconscious because her BS gets too low  . Lasix [Furosemide] Other (See Comments)    Paradoxical Response  . Other     Iv bp med unknown.and adhesive tape-silicones  . Prednisone     Sweating   . Sulfamethoxazole Other (See Comments)    Bottomed out platelets  . Tape   . Ultram [Tramadol Hcl] Other (See Comments)    "Grand mal seizure"  . Codeine Rash  . Dilantin [Phenytoin Sodium] Rash  . Latex Rash  . Piperacillin Sod-Tazobactam So Rash    Patient Measurements: Height: 4' 6"  (137.2 cm) Weight: 138 lb 7.2 oz (62.8 kg) IBW/kg (Calculated) : 31.7  Vital Signs: Temp: 98.6 F (37 C) (08/18 0637) Temp Source: Oral (08/18 0637) BP: 144/47 mmHg (08/18 0637) Pulse Rate: 70 (08/18 0637) Intake/Output from previous day:   Intake/Output from this shift: Total I/O In: 120 [P.O.:120] Out: -   Labs:  Recent Labs  08/22/14 0410  08/22/14 0612 08/22/14 1855 08/23/14 0439 08/24/14 0548  WBC  --   --  5.5  --  5.9 4.4  HGB  --   < > 6.7* 9.2* 9.1* 9.4*  PLT  --   --  71*  --  58* 59*  CREATININE 1.95*  --   --   --  1.72* 1.38*  < > = values in this interval not displayed. Estimated Creatinine Clearance: 27.5 mL/min (by C-G formula based on Cr of 1.38). No results for input(s): VANCOTROUGH, VANCOPEAK, VANCORANDOM, GENTTROUGH, GENTPEAK, GENTRANDOM, TOBRATROUGH, TOBRAPEAK, TOBRARND, AMIKACINPEAK, AMIKACINTROU, AMIKACIN in the last 72 hours.    Microbiology: Recent Results (from the past 720 hour(s))  Culture, blood (routine x 2)     Status: None (Preliminary result)   Collection Time: 08/21/14 12:55 AM  Result Value Ref Range Status   Specimen Description BLOOD RIGHT HAND  Final   Special Requests BOTTLES DRAWN AEROBIC ONLY 6CC  Final   Culture NO GROWTH 3 DAYS  Final   Report Status PENDING  Incomplete  Culture, blood (routine x 2)     Status: None (Preliminary result)   Collection Time: 08/21/14  1:10 AM  Result Value Ref Range Status   Specimen Description BLOOD RIGHT HAND  Final   Special Requests BOTTLES DRAWN AEROBIC ONLY [place]  Final   Culture NO GROWTH 3 DAYS  Final   Report Status PENDING  Incomplete  Urine culture     Status: None   Collection Time: 08/21/14  1:51 AM  Result Value Ref Range Status   Specimen Description URINE, CATHETERIZED  Final   Special Requests NONE  Final   Culture   Final    NO GROWTH 2 DAYS Performed at [facility]    Report Status 08/23/2014 FINAL  Final  MRSA PCR Screening     Status: None   Collection Time: 08/21/14  4:40 AM  Result Value Ref Range Status  MRSA by PCR NEGATIVE NEGATIVE Final   Medical History: Past Medical History  Diagnosis Date  . Kidney disease   . Hypertension   . Diabetes mellitus   . 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 Arthritis   . CHF (congestive heart failure)   . Lymphedema of leg   . Anxiety   . Pulmonary hypertension   . 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 Pancreatitis   . Depression   . Anemia   . Shingles   . PUD (peptic ulcer disease)   . GERD (gastroesophageal reflux disease)   . MRSA (methicillin resistant staph aureus) culture positive    Anti-infectives    Start     Dose/Rate Route Frequency Ordered Stop   08/21/14 1200  aztreonam (AZACTAM) 1 g in dextrose 5 % 50 mL IVPB     1 g 100 mL/hr over 30 Minutes Intravenous Every 8 hours 08/21/14 1025     08/21/14 1000  linezolid (ZYVOX) tablet 600 mg      600 mg Oral Every 12 hours 08/21/14 0926     08/21/14 0715  vancomycin (VANCOCIN) IVPB 1000 mg/200 mL premix  Status:  Discontinued     1,000 mg 200 mL/hr over 60 Minutes Intravenous  Once 08/21/14 0711 08/21/14 0926   08/21/14 0200  levofloxacin (LEVAQUIN) IVPB 750 mg     750 mg 100 mL/hr over 90 Minutes Intravenous  Once 08/21/14 0158 08/21/14 0434   08/21/14 0200  aztreonam (AZACTAM) 2 g in dextrose 5 % 50 mL IVPB     2 g 100 mL/hr over 30 Minutes Intravenous  Once 08/21/14 0158 08/21/14 0434     Assessment: 69yo female admitted with low grade fever and suspected sepsis.  Source remains unclear, urine cx (-), blood cultures pending.  Pt has distant h/o MRSA infection.  SCr elevated on admission but is gradually improving.  Normalized clcr 40-13m/min.  Pt has refused Vancomycin stating previous allergic rxn, ID has given OK for Zyvox.   Goal of Therapy:  Eradicate infection.  Plan:  Continue Zyvox 6073mPO q12hrs per MD Continue Aztreonam 1gm IV q8h Deescalate ABX when improved / appropriate Monitor labs, renal fxn, progress, and c/s Duration of therapy per MD  HaHart Robinsons 08/24/2014,10:29 AM

## 2014-08-25 LAB — URIC ACID: Uric Acid, Serum: 9.5 mg/dL — ABNORMAL HIGH (ref 2.3–6.6)

## 2014-08-25 MED ORDER — METOPROLOL SUCCINATE ER 50 MG PO TB24
100.0000 mg | ORAL_TABLET | Freq: Every day | ORAL | Status: DC
  Administered 2014-08-25 – 2014-08-27 (×3): 100 mg via ORAL
  Filled 2014-08-25 (×3): qty 2

## 2014-08-25 MED ORDER — LORAZEPAM 0.5 MG PO TABS
0.5000 mg | ORAL_TABLET | Freq: Once | ORAL | Status: AC
  Administered 2014-08-25: 0.5 mg via ORAL
  Filled 2014-08-25: qty 1

## 2014-08-25 NOTE — Consult Note (Signed)
Consulting cardiologist: Carlyle Dolly MD Primary Cardiologist: New (Sees Dr. [place] [facility])  Cardiology Specific Problem List: 1. Pulmonic Insufficiency 2. Hypotension  Subjective:    Complains of left foot pain across the top, proximal to the toes. Hurts with movement and palpation. Unable to sleep last night but is sleeping some this am.   Objective:   Temp:  [97.7 F (36.5 C)-99.2 F (37.3 C)] 99.2 F (37.3 C) (08/19 0628) Pulse Rate:  [70-75] 73 (08/19 0628) Resp:  [16-20] 20 (08/19 0628) BP: (132-159)/(48-75) 158/75 mmHg (08/19 0628) SpO2:  [96 %-99 %] 96 % (08/19 0628) Last BM Date: 08/24/14  Filed Weights   08/21/14 0008 08/21/14 0457 08/22/14 0600  Weight: 125 lb (56.7 kg) 136 lb 3.9 oz (61.8 kg) 138 lb 7.2 oz (62.8 kg)    Intake/Output Summary (Last 24 hours) at 08/25/14 0736 Last data filed at 08/25/14 9675  Gross per 24 hour  Intake    480 ml  Output   1300 ml  Net   -820 ml    Telemetry: NSR in the 70's.   Exam:  General: No acute distress.  HEENT: Conjunctiva and lids normal, oropharynx clear.  Lungs: Clear to auscultation, nonlabored.  Cardiac: No elevated JVP or bruits. RRR, 2/6 systolic murmur at the LSB,  Abdomen: Normoactive bowel sounds, nontender, nondistended.  Extremities: No pitting edema, distal pulses full.Pain across the top of the left foot, just proximal to the toes. Worse with movement and palpation   Neuropsychiatric: Alert and oriented x3, affect appropriate.   Lab Results:  Basic Metabolic Panel:  Recent Labs Lab 08/22/14 0410 08/23/14 0439 08/24/14 0548  NA 134* 136 136  K 3.9 3.7 3.6  CL 110 112* 111  CO2 16* 16* 20*  GLUCOSE 61* 88 91  BUN 51* 45* 33*  CREATININE 1.95* 1.72* 1.38*  CALCIUM 7.4* 7.5* 7.8*    Liver Function Tests:  Recent Labs Lab 08/21/14 0029  AST 29  ALT 13*  ALKPHOS 60  BILITOT 0.5  PROT 7.8  ALBUMIN 4.3    CBC:  Recent Labs Lab 08/22/14 0612 08/22/14 1855  08/23/14 0439 08/24/14 0548  WBC 5.5  --  5.9 4.4  HGB 6.7* 9.2* 9.1* 9.4*  HCT 20.8* 28.2* 27.5* 28.9*  MCV 104.0*  --  98.2 99.0  PLT 71*  --  58* 59*    Cardiac Enzymes:  Recent Labs Lab 08/21/14 0029  TROPONINI <0.03    BNP:  Recent Labs  11/03/13 1642  PROBNP 6043.0*    Coagulation:  Recent Labs Lab 08/21/14 0709  INR 1.45   Echocardiogram: 08/21/2014 Left ventricle: The cavity size was normal. Wall thickness was normal. Systolic function was normal. The estimated ejection fraction was in the range of 60% to 65%. Wall motion was normal; there were no regional wall motion abnormalities. Left ventricular diastolic function parameters were normal. - Aortic valve: Mildly to moderately calcified annulus. Trileaflet; mildly thickened leaflets. Valve area (VTI): 1.6 cm^2. Valve area (Vmax): 1.56 cm^2. - Mitral valve: Mildly calcified annulus. Normal thickness leaflets - Right ventricle: The cavity size was moderately dilated. - Right atrium: The interatrial septum bows to the left consistent with elevated RA pressure. The atrium was mildly to moderately dilated. - Tricuspid valve: There was mild-moderate regurgitation. The TR vena contracta is 0.3 cm. - Pulmonic valve: Not well visualized. There is at least moderate regurgitation. There is no significant stenosis. - Pulmonary arteries: Systolic pressure was mildly increased. PA peak pressure: 35 mm  Hg (S). - Systemic veins: IVC is normal size with decreased respiratory variation. Estimated RA pressure is 8 mmHg. - Technically difficult study.  Medications:   Scheduled Medications: . aztreonam  1 g Intravenous Q8H  . bumetanide  1 mg Oral Daily  . clonazePAM  0.5 mg Oral BID  . docusate sodium  100 mg Oral BID  . exemestane  25 mg Oral Daily  . linezolid  600 mg Oral Q12H  . magnesium oxide  400 mg Oral Daily  . montelukast  10 mg Oral QHS  . pantoprazole  40 mg Oral Daily  .  pregabalin  75 mg Oral BID  . sodium bicarbonate  650 mg Oral BID  . sodium chloride  3 mL Intravenous Q12H    Infusions:    PRN Medications: sodium chloride, acetaminophen **OR** acetaminophen, alum & mag hydroxide-simeth, ondansetron **OR** ondansetron (ZOFRAN) IV, oxyCODONE, polyethylene glycol, sodium chloride   Assessment and Plan:   1. Septic Shock: Continues on abx per PCP and ID assistance.   2. Pulmonary Insufficiency:  Moderate pulmonic regurgitation, with murmur auscultated. She does not complain of dyspnea and is able to lie flat without PND or orthopnea. She is positive on I/O 4.9 liters. She diuressed 1/3 liters over last 24 hours on bumex. 1 mg daily. No evidence of CHF decompensation currently. Creatinine is 1.38, improved from 1.72 yesterday. Would continue current dose of bumex as she is tolerating this and has no signs of CHF currently.   3. Left Dorsal foot pain: New. States that it began yesterday. She did not fall or twist foot. Pain with palpation and movement. Checking uric acid level. May need x-ray if clinically indicated. Will defer to primary.   4. Anemia: Hgb stable. Received blood transfusion on 08/22/2014.   5. Thrombocytopenia: Stable.      Phill Myron. Lawrence NP [place]  08/25/2014, 7:36 AM   Patient seen and discussed with NP Hulet Nails, I agree with her documentation. BP has normalized and now she is hypertensive, will restart her Toprol XL today at lower dose of 100mg  daily. Bumex restarted yesterday, will continue current dosing as she continues to appear euvlomic. From outside labs Cr had been in low 2 range, significantly improved this admit since her ACE and diuretic were held. Will continue to hold ACE-I, will continue current diuretic as her outside labs suggest she may have been overdiuresed. Medications can be further adjusted at out outpatient follow up with her primary cardiologist. Further work up for her PI per her regular cardiologist. We will  sign off inpatient care.   Zandra Abts MD

## 2014-08-25 NOTE — Progress Notes (Addendum)
PROGRESS NOTE  Catherine Mccullough VPX:[id number] DOB: July 09, 1945 DOA: 08/21/2014 PCP: Default, Provider, MD  Dr. Guilford Shi- Cardiac Surgeon  Dr Maree Erie- Cardiologist   Summary: 69 yof with a hx of metastatic breast cancer, DM, CHF and HTN presented with fever and myalgias. She was admitted for a sepsis unclear etiology with hypotension      Assessment/Plan: 1. Septic shock. Resolved. Hemodynamically stable, breathing improving. CXR and u/a negative for Urine culture showed no growth, BC NGTD. 2. Hypotension. Resolved, now hypertensive. Not cardiogenic per echo. Secondary to sepsis. 3. AKI. Resolved. Stage IV CKD, at baseline 4. Thrombocytopenia. Lovenox stopped, secondary to sepsis. Present on admission.  5. Atypical chest pain. No further evaluation suggested. Likely secondary to left seventh rib fracture 6. Severe pulmonary valve insufficiency, pulm HTN, and moderate RV dilatation. Work up done at [facility] . Being considered for percutaneous pulmonary valve replacement at [facility] was scheduled for outpatient TEE at [facility] 8/16. Echo this admssion with normal LV function. Cardiology has signed off. Outpatient follow up planned   7. Anemia, s/p 2 unit PRBC 8/16. Stable  8. DM type 2, diet controlled 9. Metastatic breast cancer    Overall continues to improve, no hypoxia   Agree with restarting Toprol XL, continue Bumex, agree with holding Ace inhibitor for now .  Continue abx with last dose tomorrow 8/20  CBC in AM  Anticipate discharge home tomorrow with [facility]   Code Status: Full DVT prophylaxis: Lovenox Family Communication: pt alone, no questions at this time  8/19 Disposition Plan: Possibly home within the next 24 hours   Murray Hodgkins, MD  [facility]  Pager [phone] If 7PM-7AM, please contact night-coverage at [email or web address], password [facility] 08/25/2014, 6:33 AM  LOS: 4 days   Consultants:  Cardiology  PT - placed on fall precautions. Recommended HH  PT   Procedures:  s/p 3 unit PRBC 8/16  Echo Study Conclusions  - Left ventricle: The cavity size was normal. Wall thickness was normal. Systolic function was normal. The estimated ejection fraction was in the range of 60% to 65%. Wall motion was normal; there were no regional wall motion abnormalities. Left ventricular diastolic function parameters were normal. - Aortic valve: Mildly to moderately calcified annulus. Trileaflet; mildly thickened leaflets. Valve area (VTI): 1.6 cm^2. Valve area (Vmax): 1.56 cm^2. - Mitral valve: Mildly calcified annulus. Normal thickness leaflets . - Right ventricle: The cavity size was moderately dilated. - Right atrium: The interatrial septum bows to the left consistent with elevated RA pressure. The atrium was mildly to moderately dilated. - Tricuspid valve: There was mild-moderate regurgitation. The TR vena contracta is 0.3 cm. - Pulmonic valve: Not well visualized. There is at least moderate regurgitation. There is no significant stenosis. - Pulmonary arteries: Systolic pressure was mildly increased. PA peak pressure: 35 mm Hg (S). - Systemic veins: IVC is normal size with decreased respiratory variation. Estimated RA pressure is 8 mmHg. - Technically difficult study.  Antibiotics:  Zyvox 8/15 >> 8/20  Aztreonam 8/15 >> 8/20   HPI/Subjective: Didn't sleep well, complains of left foot pain starting last night.  Eating okay. No n/v.  Breathing improved. Was ambulatory yesterday.   Objective: Filed Vitals:   08/24/14 1407 08/24/14 2228 08/25/14 0228 08/25/14 0628  BP: 142/48 159/67 132/48 158/75  Pulse: 70 75 75 73  Temp: 98.7 F (37.1 C) 98.2 F (36.8 C) 97.7 F (36.5 C) 99.2 F (37.3 C)  TempSrc: Oral Oral Oral Oral  Resp: 16 20 17 20   Height:  Weight:      SpO2: 99% 99% 98% 96%    Intake/Output Summary (Last 24 hours) at 08/25/14 2706 Last data filed at 08/25/14 2376  Gross per 24 hour   Intake    480 ml  Output   1300 ml  Net   -820 ml     Filed Weights   08/21/14 0008 08/21/14 0457 08/22/14 0600  Weight: 56.7 kg (125 lb) 61.8 kg (136 lb 3.9 oz) 62.8 kg (138 lb 7.2 oz)    Exam:     Afebrile, no hypoxia  General:  Appears calm and comfortable, lying in bed  Eyes: PERRL, normal lids, irises & conjunctiva ENT: grossly normal hearing, lips & tongue Cardiovascular: RRR, no r/g. No LE edema. 2/6 diastolic murmur LUSB  Telemetry: SR, no arrhythmias  Respiratory: CTA bilaterally, no w/r/r. Normal respiratory effort. Skin: left foot unremarkable. DP 2+ left foot. Some tenderness over ATF ligament, no edema. No erythema.   Musculoskeletal: ROM generally intact left foot Psychiatric: grossly normal mood and affect, speech fluent and appropriate   New data reviewed:  Uric acid 9.5  Pertinent data since admission:    Pending data:    Scheduled Meds: . aztreonam  1 g Intravenous Q8H  . bumetanide  1 mg Oral Daily  . clonazePAM  0.5 mg Oral BID  . docusate sodium  100 mg Oral BID  . exemestane  25 mg Oral Daily  . linezolid  600 mg Oral Q12H  . magnesium oxide  400 mg Oral Daily  . montelukast  10 mg Oral QHS  . pantoprazole  40 mg Oral Daily  . pregabalin  75 mg Oral BID  . sodium bicarbonate  650 mg Oral BID  . sodium chloride  3 mL Intravenous Q12H   Continuous Infusions:    Principal Problem:   Severe sepsis with septic shock Active Problems:   Breast cancer metastasized to bone   Chronic pain   Right heart failure   Thrombocytopenia   CKD (chronic kidney disease) stage 4, GFR 15-29 ml/min   DM type 2 (diabetes mellitus, type 2)   Fracture five ribs-closed   Pulmonary insufficiency   AKI (acute kidney injury)   Time spent 25 minutes  I, Arielle Khosrowpour, acting a scribe, recorded this note contemporaneously in the presence of Dr. Melene Plan. Sarajane Jews, M.D. on 08/25/2014   I have reviewed the above documentation for accuracy and  completeness, and I agree with the above. Murray Hodgkins, MD

## 2014-08-25 NOTE — Care Management Important Message (Signed)
Important Message  Patient Details  Name: Catherine Mccullough MRN: [id number] Date of Birth: [birth year]   Medicare Important Message Given:  Yes-second notification given    Joylene Draft, RN 08/25/2014, 11:21 AM

## 2014-08-25 NOTE — Progress Notes (Signed)
Physical Therapy Treatment Patient Details Name: Catherine Mccullough MRN: [id number] DOB: 01-19-1945 Today's Date: 08/25/2014    History of Present Illness HPI: Catherine Mccullough is a 69 y.o. female with a history of Metastatic Breast Cancer (1989, 1999) S/P Chemo and Radiation Rx, CHF, HTN, DM2 and CKD who presents to the ED with complaints of Fevers, Chills, Myalgias, SOB and Chest pain with Nausea and Vomiting x 1 day. She reports having 10/10 Chest Pain in the mid chest area. She denies any cough or dysuria. She was found to have a temperature of 100.6 in the ED. A Sepsis workup was initiated.     PT Comments    Pt states that her left foot is very sore and describes it to the the dorsum of the foot.  Her foot was inspected and not found to have any visible edema, warmth or erythema.  She has full active ROM of the left ankle.  She currently has her shoes and  Wheeled walker in her room and she was able to tolerate a shoe on her foot.  She was able to ambulate 150' using a walker with offloading of some weight from the left foot for comfort.  Her gait is very stable with a walker.  She should be able to transition to home and she would like HHPT services.  Follow Up Recommendations  Home health PT     Equipment Recommendations  None recommended by PT    Recommendations for Other Services  none     Precautions / Restrictions Precautions Precautions: Fall Restrictions Weight Bearing Restrictions: No    Mobility  Bed Mobility Overal bed mobility: Modified Independent                Transfers Overall transfer level: Modified independent Equipment used: Rolling walker (2 wheeled) Transfers: Sit to/from Stand Sit to Stand: Modified independent (Device/Increase time)            Ambulation/Gait Ambulation/Gait assistance: Modified independent (Device/Increase time) Ambulation Distance (Feet): 150 Feet Assistive device: 4-wheeled walker Gait Pattern/deviations:  WFL(Within Functional Limits)   Gait velocity interpretation: <1.8 ft/sec, indicative of risk for recurrent falls     Stairs            Wheelchair Mobility    Modified Rankin (Stroke Patients Only)       Balance Overall balance assessment: No apparent balance deficits (not formally assessed)                                  Cognition Arousal/Alertness: Awake/alert Behavior During Therapy: WFL for tasks assessed/performed Overall Cognitive Status: Within Functional Limits for tasks assessed                      Exercises      General Comments        Pertinent Vitals/Pain Pain Assessment: No/denies pain    Home Living                      Prior Function            PT Goals (current goals can now be found in the care plan section) Progress towards PT goals: Goals met/education completed, patient discharged from PT    Frequency       PT Plan Current plan remains appropriate    Co-evaluation  End of Session Equipment Utilized During Treatment: Gait belt Activity Tolerance: Patient tolerated treatment well Patient left: in bed;with call bell/phone within reach;with bed alarm set     Time: 8257-4935 PT Time Calculation (min) (ACUTE ONLY): 31 min  Charges:  $Gait Training: 23-37 mins                    G CodesSable Feil  PT 08/25/2014, 12:11 PM [phone]

## 2014-08-26 LAB — CULTURE, BLOOD (ROUTINE X 2)
Culture: NO GROWTH
Culture: NO GROWTH

## 2014-08-26 LAB — CBC
HCT: 36.8 % (ref 36.0–46.0)
Hemoglobin: 12.1 g/dL (ref 12.0–15.0)
MCH: 32.7 pg (ref 26.0–34.0)
MCHC: 32.9 g/dL (ref 30.0–36.0)
MCV: 99.5 fL (ref 78.0–100.0)
Platelets: 72 K/uL — ABNORMAL LOW (ref 150–400)
RBC: 3.7 MIL/uL — ABNORMAL LOW (ref 3.87–5.11)
RDW: 18.9 % — ABNORMAL HIGH (ref 11.5–15.5)
WBC: 7.2 K/uL (ref 4.0–10.5)

## 2014-08-26 MED ORDER — LORAZEPAM 0.5 MG PO TABS
0.5000 mg | ORAL_TABLET | Freq: Once | ORAL | Status: AC
  Administered 2014-08-26: 0.5 mg via ORAL
  Filled 2014-08-26: qty 1

## 2014-08-26 NOTE — Discharge Summary (Signed)
Physician Discharge Summary  Catherine Mccullough VWU:[id number] DOB: 1945/11/14 DOA: 08/21/2014  PCP: Default, Provider, MD Alonza Bogus, [place]  [place], [place] [address]  [phone]  [phone] (Fax)  Cardiologist:  Eileen Stanford, [place], [place] [address]  [phone]  [phone] (Fax)   Admit date: 08/21/2014 Discharge date: 08/26/2014  Recommendations for Outpatient Follow-up:   Follow up with Dr. Harl Bowie, Cardiologist, as outpatient, see below by patient request  Changed Toprol XL to 100mg  daily.   Changed Bumex to daily.   Follow up with PCP in 1-2 weeks. Patient will arrange followup.  Follow-up Information    Follow up with [facility].   Contact information:   [address] [place] [address] [phone]       Follow up with Carlyle Dolly, MD. Schedule an appointment as soon as possible for a visit in 2 weeks.   Specialty:  Cardiology   Contact information:   [address] [place] [address] [phone]      Discharge Diagnoses:  1. Septic shock.  2. Hypotension. 3. AKI superimposed on CKD stage IV 4. Thrombocytopenia secondary to sepsis. 5. Atypical chest pain.  6. Severe pulmonary valve insufficiency, pulm HTN, and moderate RV dilatation.  7. Anemia of acute illness superimposed on anemia of CKD/chronic disease 8. DM type 2. 9. Metastatic breast cancer.  Discharge Condition: Improved  Disposition: Home with [facility]  Diet recommendation: Heart healthy   Filed Weights   08/21/14 0008 08/21/14 0457 08/22/14 0600  Weight: 56.7 kg (125 lb) 61.8 kg (136 lb 3.9 oz) 62.8 kg (138 lb 7.2 oz)    History of present illness:  42 yof with a hx of metastatic breast cancer, DM, CHF and HTN presented with fever and myalgias. She was admitted for a sepsis unclear etiology with hypotension  Hospital Course:  Septic shock treated empirically with 7  days Zyvox and Aztreonam. Etiology unclear, culture, labs and imaging unrevealing. Through aggressive IVF hypotension and AKI were also resolved. Seen by cardiology for valvular issues as below, with ultimate recommendations for changes to Toprol and Bumex as below. Echo suggested hypotension was secondary to sepsis and not cardiogenic in etiology. Upon discharge she will receive home health services and will continue care at [facility] for severe pulmonary valve insufficiency, pulm HTN, and moderate RV dilatation. She has requested f/u with local cardiology for second opinion.  Individual issues as below: 1. Septic shock. Resolved, doing well. Hemodynamically stable. CXR and u/a negative. Urine culture showed no growth, BC NGTD. 2. Hypotension. Resolved. Continue Toprol XL100mg  daily as per cardiology. 3. AKI. Resolved. 4. Stage IV CKD, at baseline 5. Thrombocytopenia. Trending back up, secondary to infection, present on admission. Expect spontaneous resolution 6. Atypical chest pain. No further evaluation suggested. Likely secondary to left seventh rib fracture 7. Severe pulmonary valve insufficiency, pulm HTN, and moderate RV dilatation. Work up done at [facility] . Being considered for percutaneous pulmonary valve replacement at [facility] was scheduled for outpatient TEE at [facility] 8/16. Echo this admssion with normal LV function. Cardiology has signed of. Outpatient follow up planned  8. Anemia, s/p 2 unit PRBC 8/16. Stable  9. DM type 2, diet controlled 10. Metastatic breast cancer  11. Loose stools, resolved. Secondary to Colace  Consultants:  Cardiology  PT - placed on fall precautions. Recommended HH PT  Procedures:  s/p 2 unit PRBC 8/16  Echo Study Conclusions  - Left ventricle: The cavity  size was normal. Wall thickness was normal. Systolic function was normal. The estimated ejection fraction was in the range of 60% to 65%. Wall motion was normal; there were no regional  wall motion abnormalities. Left ventricular diastolic function parameters were normal. - Aortic valve: Mildly to moderately calcified annulus. Trileaflet; mildly thickened leaflets. Valve area (VTI): 1.6 cm^2. Valve area (Vmax): 1.56 cm^2. - Mitral valve: Mildly calcified annulus. Normal thickness leaflets . - Right ventricle: The cavity size was moderately dilated. - Right atrium: The interatrial septum bows to the left consistent with elevated RA pressure. The atrium was mildly to moderately dilated. - Tricuspid valve: There was mild-moderate regurgitation. The TR vena contracta is 0.3 cm. - Pulmonic valve: Not well visualized. There is at least moderate regurgitation. There is no significant stenosis. - Pulmonary arteries: Systolic pressure was mildly increased. PA peak pressure: 35 mm Hg (S). - Systemic veins: IVC is normal size with decreased respiratory variation. Estimated RA pressure is 8 mmHg. - Technically difficult study.  Antibiotics:  Zyvox 8/15 >> 8/21  Aztreonam 8/15 >> 8/21  Discharge Instructions  Discharge Instructions    (HEART FAILURE PATIENTS) Call MD:  Anytime you have any of the following symptoms: 1) 3 pound weight gain in 24 hours or 5 pounds in 1 week 2) shortness of breath, with or without a dry hacking cough 3) swelling in the hands, feet or stomach 4) if you have to sleep on extra pillows at night in order to breathe.    Complete by:  As directed      Diet - low sodium heart healthy    Complete by:  As directed      Diet Carb Modified    Complete by:  As directed      Discharge instructions    Complete by:  As directed   Call your physician or seek immediate medical attention for shortness of breath, fever, swelling, weight gain or worsening of condition.     Increase activity slowly    Complete by:  As directed             Current Discharge Medication List    CONTINUE these medications which have CHANGED   Details    bumetanide (BUMEX) 1 MG tablet Take 1 tablet (1 mg total) by mouth daily. Qty: 30 tablet, Refills: 0    metoprolol succinate (TOPROL-XL) 100 MG 24 hr tablet Take 1 tablet (100 mg total) by mouth daily. Take with or immediately following a meal. Qty: 30 tablet, Refills: 0    sodium bicarbonate 650 MG tablet Take 1 tablet (650 mg total) by mouth 2 (two) times daily.      CONTINUE these medications which have NOT CHANGED   Details  cetirizine (ZYRTEC) 10 MG tablet Take 5 mg by mouth daily as needed for allergies.    citalopram (CELEXA) 10 MG tablet Take 1 tablet by mouth daily.    clonazePAM (KLONOPIN) 0.5 MG tablet Take 1 tablet by mouth twice daily x 30 days. (use for Klonopin) Qty: 30 tablet, Refills: 0    docusate sodium (COLACE) 100 MG capsule Take 100 mg by mouth 2 (two) times daily.    esomeprazole (NEXIUM) 40 MG capsule Take 40 mg by mouth daily before breakfast.      exemestane (AROMASIN) 25 MG tablet Take 25 mg by mouth daily.      fluticasone (FLONASE) 50 MCG/ACT nasal spray Place 1 spray into both nostrils daily as needed for allergies.     LYRICA  75 MG capsule Take 75 mg by mouth 3 (three) times daily.    magnesium oxide (MAG-OX) 400 MG tablet Take 400 mg by mouth daily.    Melatonin 3 MG CAPS Take 3 mg by mouth at bedtime.    montelukast (SINGULAIR) 10 MG tablet Take 10 mg by mouth at bedtime.      Multiple Vitamins-Minerals (MULTIVITAMINS THER. W/MINERALS) TABS Take 1 tablet by mouth daily.      oxyCODONE-acetaminophen (PERCOCET) 10-325 MG per tablet Take 1 tablet by mouth every 6 (six) hours as needed for pain. Qty: 20 tablet, Refills: 0    polyethylene glycol (MIRALAX / GLYCOLAX) packet Take 17 g by mouth daily as needed (constipation).     PROAIR HFA 108 (90 BASE) MCG/ACT inhaler Take 2 puffs by mouth every 6 (six) hours as needed.      STOP taking these medications     folic acid (FOLVITE) 1 MG tablet      lisinopril (PRINIVIL,ZESTRIL) 2.5 MG tablet       vitamin B-12 (CYANOCOBALAMIN) 100 MCG tablet      vitamin C (ASCORBIC ACID) 500 MG tablet      vitamin E 400 UNIT capsule        Allergies  Allergen Reactions  . Mometasone Shortness Of Breath  . Vancomycin Itching  . Aspirin Other (See Comments)    "Inflames stomach"  . Contrast Media [Iodinated Diagnostic Agents] Other (See Comments)    Made Heart Stop.   . Cortisone Other (See Comments)    Hold fluid  . Doxycycline Nausea And Vomiting  . Ibuprofen Nausea And Vomiting  . Insulins Other (See Comments)    Pt says even the tiniest bit of insulin makes her go unconscious because her BS gets too low  . Lasix [Furosemide] Other (See Comments)    Paradoxical Response  . Other     Iv bp med unknown.and adhesive tape-silicones  . Prednisone     Sweating   . Sulfamethoxazole Other (See Comments)    Bottomed out platelets  . Tape   . Ultram [Tramadol Hcl] Other (See Comments)    "Grand mal seizure"  . Codeine Rash  . Dilantin [Phenytoin Sodium] Rash  . Latex Rash  . Piperacillin Sod-Tazobactam So Rash    The results of significant diagnostics from this hospitalization (including imaging, microbiology, ancillary and laboratory) are listed below for reference.    Significant Diagnostic Studies: Dg Chest Port 1 View  08/21/2014   CLINICAL DATA:  Acute onset of generalized body aches, chills, nausea and vomiting. Chest pain. Initial encounter.  EXAM: PORTABLE CHEST - 1 VIEW  COMPARISON:  Chest radiograph performed 03/04/2014  FINDINGS: The lungs are well-aerated. Pulmonary vascularity is at the upper limits of normal. There is no evidence of focal opacification, pleural effusion or pneumothorax.  The cardiomediastinal silhouette is borderline normal in size. There appears to be a mildly displaced left seventh lateral rib fracture.  IMPRESSION: 1. Lungs appear grossly clear. 2. Apparent mildly displaced left seventh lateral rib fracture. Would correlate for associated symptoms.    Electronically Signed   By: Garald Balding M.D.   On: 08/21/2014 01:42    Microbiology: Recent Results (from the past 240 hour(s))  Culture, blood (routine x 2)     Status: None   Collection Time: 08/21/14 12:55 AM  Result Value Ref Range Status   Specimen Description BLOOD RIGHT HAND  Final   Special Requests BOTTLES DRAWN AEROBIC ONLY [facility]  Final   Culture  NO GROWTH 5 DAYS  Final   Report Status 08/26/2014 FINAL  Final  Culture, blood (routine x 2)     Status: None   Collection Time: 08/21/14  1:10 AM  Result Value Ref Range Status   Specimen Description BLOOD RIGHT HAND  Final   Special Requests BOTTLES DRAWN AEROBIC ONLY 6CC  Final   Culture NO GROWTH 5 DAYS  Final   Report Status 08/26/2014 FINAL  Final  Urine culture     Status: None   Collection Time: 08/21/14  1:51 AM  Result Value Ref Range Status   Specimen Description URINE, CATHETERIZED  Final   Special Requests NONE  Final   Culture   Final    NO GROWTH 2 DAYS Performed at [facility]    Report Status 08/23/2014 FINAL  Final  MRSA PCR Screening     Status: None   Collection Time: 08/21/14  4:40 AM  Result Value Ref Range Status   MRSA by PCR NEGATIVE NEGATIVE Final     Labs: Basic Metabolic Panel:  Recent Labs Lab 08/21/14 0029 08/21/14 0709 08/22/14 0410 08/23/14 0439 08/24/14 0548  NA 137 136 134* 136 136  K 4.7 4.7 3.9 3.7 3.6  CL 103 108 110 112* 111  CO2 20* 17* 16* 16* 20*  GLUCOSE 72 69 61* 88 91  BUN 57* 54* 51* 45* 33*  CREATININE 2.15* 2.06* 1.95* 1.72* 1.38*  CALCIUM 10.1 8.5* 7.4* 7.5* 7.8*   Liver Function Tests:  Recent Labs Lab 08/21/14 0029  AST 29  ALT 13*  ALKPHOS 60  BILITOT 0.5  PROT 7.8  ALBUMIN 4.3  CBC:  Recent Labs Lab 08/21/14 0029 08/21/14 0709 08/22/14 0612 08/22/14 1855 08/23/14 0439 08/24/14 0548 08/26/14 0626  WBC 4.6 6.0 5.5  --  5.9 4.4 7.2  NEUTROABS 4.0  --   --   --   --   --   --   HGB 9.9* 8.4* 6.7* 9.2* 9.1* 9.4* 12.1  HCT 31.6*  26.4* 20.8* 28.2* 27.5* 28.9* 36.8  MCV 106.0* 105.6* 104.0*  --  98.2 99.0 99.5  PLT 129* 96* 71*  --  58* 59* 72*   Cardiac Enzymes:  Recent Labs Lab 08/21/14 0029  TROPONINI <0.03   BNP: BNP (last 3 results)  Recent Labs  03/04/14 0726 08/21/14 0029  BNP 1040.0* 1557.0*     Principal Problem:   Severe sepsis with septic shock Active Problems:   Breast cancer metastasized to bone   Chronic pain   Right heart failure   Thrombocytopenia   CKD (chronic kidney disease) stage 4, GFR 15-29 ml/min   DM type 2 (diabetes mellitus, type 2)   Fracture five ribs-closed   Pulmonary insufficiency   AKI (acute kidney injury)   Time coordinating discharge: 35 minutes   Signed:  Murray Hodgkins, MD [facility] 08/26/2014, 6:51 AM   I, Laban Emperor. Leonie Green, acting as scribe, recorded this note contemporaneously in the presence of Dr. Melene Plan. Sarajane Jews, M.D. on 08/26/2014 .  I have reviewed the above documentation for accuracy and completeness, and I agree with the above. Murray Hodgkins, MD

## 2014-08-26 NOTE — Progress Notes (Signed)
PROGRESS NOTE  Catherine Mccullough QQV:[id number] DOB: 02-Oct-1945 DOA: 08/21/2014 PCP: Default, Provider, MD  Dr. Guilford Shi- Cardiac Surgeon  Dr Maree Erie- Cardiologist   Summary: 76 yof with a hx of metastatic breast cancer, DM, CHF and HTN presented with fever and myalgias. She was admitted for a sepsis unclear etiology with hypotension      Assessment/Plan: 1. Septic shock. Resolved. Hemodynamically stable. CXR and u/a negative for Urine culture showed no growth, BC NGTD. 2. Hypotension. Resolved. Continue Toprol XL100mg  daily. 3. AKI. Resolved. 4. Stage IV CKD, at baseline 5. Thrombocytopenia. Trending back up, secondary to infection, present on admission.  6. Atypical chest pain. No further evaluation suggested. Likely secondary to left seventh rib fracture 7. Severe pulmonary valve insufficiency, pulm HTN, and moderate RV dilatation. Work up done at [facility] . Being considered for percutaneous pulmonary valve replacement at [facility] was scheduled for outpatient TEE at [facility] 8/16. Echo this admssion with normal LV function. Cardiology has signed off. Outpatient follow up planned   8. Anemia, s/p 2 unit PRBC 8/16. Stable  9. DM type 2, diet controlled 10. Metastatic breast cancer  11. Loose stools. On Colace. Will stop and monitor.   Overall much improved, infection appears resolved.  Monitor stool output off Colace.   Home later today vs morning  Code Status: Full DVT prophylaxis: Lovenox Family Communication: pt alone, no questions at this time  8/19 Disposition Plan: Anticipate discharge home today vs tomorrow.   Murray Hodgkins, MD  [facility]  Pager [phone] If 7PM-7AM, please contact night-coverage at [email or web address], password [facility] 08/26/2014, 6:47 AM  LOS: 5 days   Consultants:  Cardiology  PT - placed on fall precautions. Recommended HH PT  Procedures:  s/p 3 unit PRBC 8/16  Echo Study Conclusions  - Left ventricle: The cavity size was normal. Wall  thickness was normal. Systolic function was normal. The estimated ejection fraction was in the range of 60% to 65%. Wall motion was normal; there were no regional wall motion abnormalities. Left ventricular diastolic function parameters were normal. - Aortic valve: Mildly to moderately calcified annulus. Trileaflet; mildly thickened leaflets. Valve area (VTI): 1.6 cm^2. Valve area (Vmax): 1.56 cm^2. - Mitral valve: Mildly calcified annulus. Normal thickness leaflets . - Right ventricle: The cavity size was moderately dilated. - Right atrium: The interatrial septum bows to the left consistent with elevated RA pressure. The atrium was mildly to moderately dilated. - Tricuspid valve: There was mild-moderate regurgitation. The TR vena contracta is 0.3 cm. - Pulmonic valve: Not well visualized. There is at least moderate regurgitation. There is no significant stenosis. - Pulmonary arteries: Systolic pressure was mildly increased. PA peak pressure: 35 mm Hg (S). - Systemic veins: IVC is normal size with decreased respiratory variation. Estimated RA pressure is 8 mmHg. - Technically difficult study.  Antibiotics:  Zyvox 8/15 >> 8/20  Aztreonam 8/15 >> 8/20   HPI/Subjective: Multiple loose, watery stools. Onset 8/19 afternoon and increasingly worsen with food. A little unsteady on feet. Nervous about going home. Denies any nausea or vomiting.   Objective: Filed Vitals:   08/25/14 1421 08/25/14 2045 08/26/14 0200 08/26/14 0617  BP: 139/73 151/61 155/65 173/75  Pulse: 72 80 77 71  Temp: 97.9 F (36.6 C) 99.5 F (37.5 C) 98.7 F (37.1 C) 99 F (37.2 C)  TempSrc: Oral Oral Oral Oral  Resp: 18 18 18 18   Height:      Weight:      SpO2: 97% 100% 100% 99%  Intake/Output Summary (Last 24 hours) at 08/26/14 0647 Last data filed at 08/26/14 0645  Gross per 24 hour  Intake    636 ml  Output    400 ml  Net    236 ml     Filed Weights   08/21/14 0008  08/21/14 0457 08/22/14 0600  Weight: 56.7 kg (125 lb) 61.8 kg (136 lb 3.9 oz) 62.8 kg (138 lb 7.2 oz)    Exam: Afebrile. General:  Appears comfortable, calm. Sitting up in bed.  Eyes: PERRL, normal lids, irises ENT: grossly normal hearing, lips Neck: no LAD, masses, thyromegaly Cardiovascular: Regular rate and rhythm, no murmur, rub or gallop. No lower extremity edema. Respiratory: Clear to auscultation bilaterally, no wheezes, rales or rhonchi. Normal respiratory effort. Abdomen: soft, mild generalized tenderness, mildly distended. Psychiatric: grossly normal mood and affect, speech fluent and appropriate  New data reviewed:  Hgb normal  Platelets trending up 72.  Pertinent data since admission:    Pending data:    Scheduled Meds: . aztreonam  1 g Intravenous Q8H  . bumetanide  1 mg Oral Daily  . clonazePAM  0.5 mg Oral BID  . docusate sodium  100 mg Oral BID  . exemestane  25 mg Oral Daily  . linezolid  600 mg Oral Q12H  . magnesium oxide  400 mg Oral Daily  . metoprolol succinate  100 mg Oral Daily  . montelukast  10 mg Oral QHS  . pantoprazole  40 mg Oral Daily  . pregabalin  75 mg Oral BID  . sodium bicarbonate  650 mg Oral BID  . sodium chloride  3 mL Intravenous Q12H   Continuous Infusions:    Principal Problem:   Severe sepsis with septic shock Active Problems:   Breast cancer metastasized to bone   Chronic pain   Right heart failure   Thrombocytopenia   CKD (chronic kidney disease) stage 4, GFR 15-29 ml/min   DM type 2 (diabetes mellitus, type 2)   Fracture five ribs-closed   Pulmonary insufficiency   AKI (acute kidney injury)  Time spent: 30 minutes  I, Jessica D. Leonie Green, acting as scribe, recorded this note contemporaneously in the presence of Dr. Melene Plan. Sarajane Jews, M.D. on 08/26/2014 .  I have reviewed the above documentation for accuracy and completeness, and I agree with the above. Murray Hodgkins, MD

## 2014-08-27 MED ORDER — SODIUM BICARBONATE 650 MG PO TABS: 650.0000 mg | ORAL_TABLET | Freq: Two times a day (BID) | ORAL | Status: DC

## 2014-08-27 MED ORDER — METOPROLOL SUCCINATE ER 100 MG PO TB24: 100.0000 mg | ORAL_TABLET | Freq: Every day | ORAL | Status: DC

## 2014-08-27 MED ORDER — BUMETANIDE 1 MG PO TABS: 1.0000 mg | ORAL_TABLET | Freq: Every day | ORAL | Status: DC

## 2014-08-27 NOTE — Progress Notes (Signed)
PROGRESS NOTE  ABRAHAM MARGULIES KXF:[id number] DOB: 12-21-1945 DOA: 08/21/2014 PCP: Default, Provider, MD  Alonza Bogus, [address]  [place], [place] [address]  [phone]  [phone] (Fax)   Dr. Guilford Shi- Cardiac Surgeon  Dr Maree Erie- Cardiologist   Summary: 41 yof with a hx of metastatic breast cancer, DM, CHF and HTN presented with fever and myalgias. She was admitted for a sepsis unclear etiology with hypotension      Assessment/Plan: 1. Septic shock. Resolved, doing well. Hemodynamically stable. CXR and u/a negative. Urine culture showed no growth, BC NGTD. 2. Hypotension. Resolved. Continue Toprol XL100mg  daily as per cardiology. 3. AKI. Resolved. 4. Stage IV CKD, at baseline 5. Thrombocytopenia. Trending back up, secondary to infection, present on admission. Expect spontaneous resolution 6. Atypical chest pain. No further evaluation suggested. Likely secondary to left seventh rib fracture 7. Severe pulmonary valve insufficiency, pulm HTN, and moderate RV dilatation. Work up done at [facility] . Being considered for percutaneous pulmonary valve replacement at [facility] was scheduled for outpatient TEE at [facility] 8/16. Echo this admssion with normal LV function. Cardiology has signed of. Outpatient follow up planned   8. Anemia, s/p 2 unit PRBC 8/16. Stable  9. DM type 2, diet controlled 10. Metastatic breast cancer  11. Loose stools, resolved. Secondary to Colace   Overall much improved, infection appears resolved. Finish abx today prior to discharge.  Home today with [facility]  Consider repeat CBC on followup  Patient requests f/u with Dr. Harl Bowie for second opinion, will arrange  Murray Hodgkins, MD  [facility]  Pager [phone] If 7PM-7AM, please contact night-coverage at [email or web address], password [facility] 08/27/2014, 7:10 AM  LOS: 6 days   Consultants:  Cardiology  PT - placed on fall precautions. Recommended HH PT  Procedures:  s/p 2  unit PRBC 8/16  Echo Study Conclusions  - Left ventricle: The cavity size was normal. Wall thickness was normal. Systolic function was normal. The estimated ejection fraction was in the range of 60% to 65%. Wall motion was normal; there were no regional wall motion abnormalities. Left ventricular diastolic function parameters were normal. - Aortic valve: Mildly to moderately calcified annulus. Trileaflet; mildly thickened leaflets. Valve area (VTI): 1.6 cm^2. Valve area (Vmax): 1.56 cm^2. - Mitral valve: Mildly calcified annulus. Normal thickness leaflets . - Right ventricle: The cavity size was moderately dilated. - Right atrium: The interatrial septum bows to the left consistent with elevated RA pressure. The atrium was mildly to moderately dilated. - Tricuspid valve: There was mild-moderate regurgitation. The TR vena contracta is 0.3 cm. - Pulmonic valve: Not well visualized. There is at least moderate regurgitation. There is no significant stenosis. - Pulmonary arteries: Systolic pressure was mildly increased. PA peak pressure: 35 mm Hg (S). - Systemic veins: IVC is normal size with decreased respiratory variation. Estimated RA pressure is 8 mmHg. - Technically difficult study.  Antibiotics:  Zyvox 8/15 >> 8/21  Aztreonam 8/15 >> 8/21  HPI/Subjective: Breathing and diarrhea have improved, no stools last night or this morning. Mild cramping in right knee and left ankle is still mildly sore.  Objective: Filed Vitals:   08/26/14 1815 08/26/14 2152 08/27/14 0125 08/27/14 0616  BP: 131/48 140/46 144/53 134/57  Pulse: 76 77 73 72  Temp: 98.2 F (36.8 C) 98 F (36.7 C) 98.1 F (36.7 C) 98.5 F (36.9 C)  TempSrc: Oral Oral Oral Oral  Resp: 18 20 14 20   Height:      Weight:  SpO2: 99% 98% 96% 97%    Intake/Output Summary (Last 24 hours) at 08/27/14 0710 Last data filed at 08/27/14 0436  Gross per 24 hour  Intake    540 ml  Output    1400 ml  Net   -860 ml     Filed Weights   08/21/14 0008 08/21/14 0457 08/22/14 0600  Weight: 56.7 kg (125 lb) 61.8 kg (136 lb 3.9 oz) 62.8 kg (138 lb 7.2 oz)    Exam: Afebrile, VSS, not hypoxic General: Appears calm and comfortable Eyes: PERRL, normal lids, irises & conjunctiva ENT: grossly normal hearing, lips & tongue Cardiovascular: RRR, soft 2/6 systolic murmur. No LE edema. Respiratory: CTA bilaterally, no w/r/r. Normal respiratory effort. Skin: no rash or induration noted. Musculoskeletal: bilateral feet unremarkable, no warmth, or edema. Grossly normal range of motion.  Psychiatric: grossly normal mood and affect, speech fluent and appropriate  New data reviewed:    Pertinent data since admission:    Pending data:    Scheduled Meds: . aztreonam  1 g Intravenous Q8H  . bumetanide  1 mg Oral Daily  . clonazePAM  0.5 mg Oral BID  . exemestane  25 mg Oral Daily  . linezolid  600 mg Oral Q12H  . magnesium oxide  400 mg Oral Daily  . metoprolol succinate  100 mg Oral Daily  . montelukast  10 mg Oral QHS  . pantoprazole  40 mg Oral Daily  . pregabalin  75 mg Oral BID  . sodium bicarbonate  650 mg Oral BID  . sodium chloride  3 mL Intravenous Q12H   Continuous Infusions:    Principal Problem:   Severe sepsis with septic shock Active Problems:   Breast cancer metastasized to bone   Chronic pain   Right heart failure   Thrombocytopenia   CKD (chronic kidney disease) stage 4, GFR 15-29 ml/min   DM type 2 (diabetes mellitus, type 2)   Fracture five ribs-closed   Pulmonary insufficiency   AKI (acute kidney injury)   I, Jessica D. Leonie Green, acting as scribe, recorded this note contemporaneously in the presence of Dr. Melene Plan. Sarajane Jews, M.D. on 08/27/2014 .  I have reviewed the above documentation for accuracy and completeness, and I agree with the above. Murray Hodgkins, MD

## 2014-08-27 NOTE — Progress Notes (Signed)
ANTIBIOTIC CONSULT NOTE - follow up  Pharmacy Consult for Aztreonam Indication: rule out sepsis  Allergies  Allergen Reactions  . Mometasone Shortness Of Breath  . Vancomycin Itching  . Aspirin Other (See Comments)    "Inflames stomach"  . Contrast Media [Iodinated Diagnostic Agents] Other (See Comments)    Made Heart Stop.   . Cortisone Other (See Comments)    Hold fluid  . Doxycycline Nausea And Vomiting  . Ibuprofen Nausea And Vomiting  . Insulins Other (See Comments)    Pt says even the tiniest bit of insulin makes her go unconscious because her BS gets too low  . Lasix [Furosemide] Other (See Comments)    Paradoxical Response  . Other     Iv bp med unknown.and adhesive tape-silicones  . Prednisone     Sweating   . Sulfamethoxazole Other (See Comments)    Bottomed out platelets  . Tape   . Ultram [Tramadol Hcl] Other (See Comments)    "Grand mal seizure"  . Codeine Rash  . Dilantin [Phenytoin Sodium] Rash  . Latex Rash  . Piperacillin Sod-Tazobactam So Rash    Patient Measurements: Height: _0  (137.2 cm) Weight: 138 lb 7.2 oz (62.8 kg) IBW/kg (Calculated) : 31.7  Vital Signs: Temp: 98.5 F (36.9 C) (08/21 0616) Temp Source: Oral (08/21 0616) BP: 134/57 mmHg (08/21 0616) Pulse Rate: 72 (08/21 0616) Intake/Output from previous day: 08/20 0701 - 08/21 0700 In: 540 [P.O.:540] Out: 1400 [Urine:1400] Intake/Output from this shift: Total I/O In: 243 [P.O.:240; I.V.:3] Out: -   Labs:  Recent Labs  08/26/14 0626  WBC 7.2  HGB 12.1  PLT 72*   Estimated Creatinine Clearance: 27.5 mL/min (by C-G formula based on Cr of 1.38).  Microbiology: Recent Results (from the past 720 hour(s))  Culture, blood (routine x 2)     Status: None   Collection Time: 08/21/14 12:55 AM  Result Value Ref Range Status   Specimen Description BLOOD RIGHT HAND  Final   Special Requests BOTTLES DRAWN AEROBIC ONLY 6CC  Final   Culture NO GROWTH 5 DAYS  Final   Report Status  08/26/2014 FINAL  Final  Culture, blood (routine x 2)     Status: None   Collection Time: 08/21/14  1:10 AM  Result Value Ref Range Status   Specimen Description BLOOD RIGHT HAND  Final   Special Requests BOTTLES DRAWN AEROBIC ONLY 6CC  Final   Culture NO GROWTH 5 DAYS  Final   Report Status 08/26/2014 FINAL  Final  Urine culture     Status: None   Collection Time: 08/21/14  1:51 AM  Result Value Ref Range Status   Specimen Description URINE, CATHETERIZED  Final   Special Requests NONE  Final   Culture   Final    NO GROWTH 2 DAYS Performed at [facility]    Report Status 08/23/2014 FINAL  Final  MRSA PCR Screening     Status: None   Collection Time: 08/21/14  4:40 AM  Result Value Ref Range Status   MRSA by PCR NEGATIVE NEGATIVE Final   Medical History: Past Medical History  Diagnosis Date  . Kidney disease   . Hypertension   . Diabetes mellitus   . 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 Arthritis   . CHF (congestive heart failure)   . Lymphedema of leg   . Anxiety   . Pulmonary hypertension   . Pulmonary valve insufficiency  group B strep infection  . Pancreatitis   . Depression   . Anemia   . Shingles   . PUD (peptic ulcer disease)   . GERD (gastroesophageal reflux disease)   . MRSA (methicillin resistant staph aureus) culture positive    Anti-infectives    Start     Dose/Rate Route Frequency Ordered Stop   08/21/14 1200  aztreonam (AZACTAM) 1 g in dextrose 5 % 50 mL IVPB     1 g 100 mL/hr over 30 Minutes Intravenous Every 8 hours 08/21/14 1025     08/21/14 1000  linezolid (ZYVOX) tablet 600 mg     600 mg Oral Every 12 hours 08/21/14 0926     08/21/14 0715  vancomycin (VANCOCIN) IVPB 1000 mg/200 mL premix  Status:  Discontinued     1,000 mg 200 mL/hr over 60 Minutes Intravenous  Once 08/21/14 0711 08/21/14 0926   08/21/14 0200  levofloxacin (LEVAQUIN) IVPB 750 mg     750 mg 100 mL/hr over 90 Minutes Intravenous  Once 08/21/14 0158 08/21/14 0434    08/21/14 0200  aztreonam (AZACTAM) 2 g in dextrose 5 % 50 mL IVPB     2 g 100 mL/hr over 30 Minutes Intravenous  Once 08/21/14 0158 08/21/14 0434     Assessment:  69yo female admitted with low grade fever and suspected sepsis.  Overall much improved, infection appears resolved.  Likely d/c home soon. Goal of Therapy:  Eradicate infection.  Plan:  Continue Zyvox 634m PO q12hrs per MD Continue Aztreonam 1gm IV q8h Deescalate ABX when improved / appropriate Monitor labs, renal fxn, progress, and c/s Duration of therapy per MD  HPricilla Larsson8/21/2016,10:17 AM

## 2014-09-13 ENCOUNTER — Encounter: Payer: Self-pay | Admitting: *Deleted

## 2014-09-18 ENCOUNTER — Ambulatory Visit: Payer: Medicare Other | Admitting: Cardiology

## 2014-09-18 NOTE — Progress Notes (Unsigned)
Patient ID: AKIKO SCHEXNIDER, female   DOB: 10/12/1945, 69 y.o.   MRN: [id number]     Clinical Summary Ms. Divirgilio is a 69 y.o.female seen today for follow up of the following medical problems.  1. Pulmonic insufficiency - has been followed at [facility] for reported severe pulmonic regurgitation - TTE  ?caridac MRI ?TEE  2. Cellulitis - admitted end of August at [facility] with cellulitis and AMS. AMS thought to be due to infection and recent increase in perococet use.   Past Medical History  Diagnosis Date  . Kidney disease   . Hypertension   . Diabetes mellitus   . 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 Arthritis   . CHF (congestive heart failure)   . Lymphedema of leg   . Anxiety   . Pulmonary hypertension   . 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 Pancreatitis   . Depression   . Anemia   . Shingles   . PUD (peptic ulcer disease)   . GERD (gastroesophageal reflux disease)   . MRSA (methicillin resistant staph aureus) culture positive      Allergies  Allergen Reactions  . Mometasone Shortness Of Breath  . Vancomycin Itching  . Aspirin Other (See Comments)    "Inflames stomach"  . Contrast Media [Iodinated Diagnostic Agents] Other (See Comments)    Made Heart Stop.   . Cortisone Other (See Comments)    Hold fluid  . Doxycycline Nausea And Vomiting  . Ibuprofen Nausea And Vomiting  . Insulins Other (See Comments)    Pt says even the tiniest bit of insulin makes her go unconscious because her BS gets too low  . Lasix [Furosemide] Other (See Comments)    Paradoxical Response  . Other     Iv bp med unknown.and adhesive tape-silicones  . Prednisone     Sweating   . Sulfamethoxazole Other (See Comments)    Bottomed out platelets  . Tape   . Ultram [Tramadol Hcl] Other (See Comments)    "Grand mal seizure"  . Codeine Rash  . Dilantin [Phenytoin Sodium] Rash  . Latex Rash  . Piperacillin Sod-Tazobactam So Rash     Current Outpatient  Prescriptions  Medication Sig Dispense Refill  . bumetanide (BUMEX) 1 MG tablet Take 1 tablet (1 mg total) by mouth daily. 30 tablet 0  . cetirizine (ZYRTEC) 10 MG tablet Take 5 mg by mouth daily as needed for allergies.    . citalopram (CELEXA) 10 MG tablet Take 1 tablet by mouth daily.    . clonazePAM (KLONOPIN) 0.5 MG tablet Take 1 tablet by mouth twice daily x 30 days. (use for Klonopin) (Patient taking differently: Take 0.5 mg by mouth 2 (two) times daily. ) 30 tablet 0  . docusate sodium (COLACE) 100 MG capsule Take 100 mg by mouth 2 (two) times daily.    Marland Kitchen esomeprazole (NEXIUM) 40 MG capsule Take 40 mg by mouth daily before breakfast.      . exemestane (AROMASIN) 25 MG tablet Take 25 mg by mouth daily.      . fluticasone (FLONASE) 50 MCG/ACT nasal spray Place 1 spray into both nostrils daily as needed for allergies.     Marland Kitchen LYRICA 75 MG capsule Take 75 mg by mouth 3 (three) times daily.    . magnesium oxide (MAG-OX) 400 MG tablet Take 400 mg by mouth daily.    . Melatonin 3 MG CAPS Take 3 mg  by mouth at bedtime.    . metoprolol succinate (TOPROL-XL) 100 MG 24 hr tablet Take 1 tablet (100 mg total) by mouth daily. Take with or immediately following a meal. 30 tablet 0  . montelukast (SINGULAIR) 10 MG tablet Take 10 mg by mouth at bedtime.      . Multiple Vitamins-Minerals (MULTIVITAMINS THER. W/MINERALS) TABS Take 1 tablet by mouth daily.      Marland Kitchen oxyCODONE-acetaminophen (PERCOCET) 10-325 MG per tablet Take 1 tablet by mouth every 6 (six) hours as needed for pain. (Patient taking differently: Take 1 tablet by mouth every 4 (four) hours as needed for pain. ) 20 tablet 0  . polyethylene glycol (MIRALAX / GLYCOLAX) packet Take 17 g by mouth daily as needed (constipation).     Marland Kitchen PROAIR HFA 108 (90 BASE) MCG/ACT inhaler Take 2 puffs by mouth every 6 (six) hours as needed.    . sodium bicarbonate 650 MG tablet Take 1 tablet (650 mg total) by mouth 2 (two) times daily.     No current  facility-administered medications for this visit.     Past Surgical History  Procedure Laterality Date  . Breast surgery    . Cholecystectomy    . Abdominal hysterectomy    . Replacement total knee bilateral    . Hand tendon surgery       Allergies  Allergen Reactions  . Mometasone Shortness Of Breath  . Vancomycin Itching  . Aspirin Other (See Comments)    "Inflames stomach"  . Contrast Media [Iodinated Diagnostic Agents] Other (See Comments)    Made Heart Stop.   . Cortisone Other (See Comments)    Hold fluid  . Doxycycline Nausea And Vomiting  . Ibuprofen Nausea And Vomiting  . Insulins Other (See Comments)    Pt says even the tiniest bit of insulin makes her go unconscious because her BS gets too low  . Lasix [Furosemide] Other (See Comments)    Paradoxical Response  . Other     Iv bp med unknown.and adhesive tape-silicones  . Prednisone     Sweating   . Sulfamethoxazole Other (See Comments)    Bottomed out platelets  . Tape   . Ultram [Tramadol Hcl] Other (See Comments)    "Grand mal seizure"  . Codeine Rash  . Dilantin [Phenytoin Sodium] Rash  . Latex Rash  . Piperacillin Sod-Tazobactam So Rash      Family History  Problem Relation Age of Onset  . Cancer Paternal Grandfather   . Diabetes Sister   . Diabetes Sister   . Heart disease Maternal Grandmother   . Heart disease Father   . Hyperlipidemia       Social History Ms. Currington reports that she has never smoked. She has never used smokeless tobacco. Ms. Galvao reports that she does not drink alcohol.   Review of Systems CONSTITUTIONAL: No weight loss, fever, chills, weakness or fatigue.  HEENT: Eyes: No visual loss, blurred vision, double vision or yellow sclerae.No hearing loss, sneezing, congestion, runny nose or sore throat.  SKIN: No rash or itching.  CARDIOVASCULAR:  RESPIRATORY: No shortness of breath, cough or sputum.  GASTROINTESTINAL: No anorexia, nausea, vomiting or diarrhea. No  abdominal pain or blood.  GENITOURINARY: No burning on urination, no polyuria NEUROLOGICAL: No headache, dizziness, syncope, paralysis, ataxia, numbness or tingling in the extremities. No change in bowel or bladder control.  MUSCULOSKELETAL: No muscle, back pain, joint pain or stiffness.  LYMPHATICS: No enlarged nodes. No history of splenectomy.  PSYCHIATRIC:  No history of depression or anxiety.  ENDOCRINOLOGIC: No reports of sweating, cold or heat intolerance. No polyuria or polydipsia.  Marland Kitchen   Physical Examination There were no vitals filed for this visit. There were no vitals filed for this visit.  Gen: resting comfortably, no acute distress HEENT: no scleral icterus, pupils equal round and reactive, no palptable cervical adenopathy,  CV Resp: Clear to auscultation bilaterally GI: abdomen is soft, non-tender, non-distended, normal bowel sounds, no hepatosplenomegaly MSK: extremities are warm, no edema.  Skin: warm, no rash Neuro:  no focal deficits Psych: appropriate affect   Diagnostic Studies 08/2014 echo Study Conclusions  - Left ventricle: The cavity size was normal. Wall thickness was normal. Systolic function was normal. The estimated ejection fraction was in the range of 60% to 65%. Wall motion was normal; there were no regional wall motion abnormalities. Left ventricular diastolic function parameters were normal. - Aortic valve: Mildly to moderately calcified annulus. Trileaflet; mildly thickened leaflets. Valve area (VTI): 1.6 cm^2. Valve area (Vmax): 1.56 cm^2. - Mitral valve: Mildly calcified annulus. Normal thickness leaflets . - Right ventricle: The cavity size was moderately dilated. - Right atrium: The interatrial septum bows to the left consistent with elevated RA pressure. The atrium was mildly to moderately dilated. - Tricuspid valve: There was mild-moderate regurgitation. The TR vena contracta is 0.3 cm. - Pulmonic valve: Not well  visualized. There is at least moderate regurgitation. There is no significant stenosis. - Pulmonary arteries: Systolic pressure was mildly increased. PA peak pressure: 35 mm Hg (S). - Systemic veins: IVC is normal size with decreased respiratory variation. Estimated RA pressure is 8 mmHg. - Technically difficult study.    Assessment and Plan        Arnoldo Lenis, M.D.

## 2014-09-22 ENCOUNTER — Encounter: Payer: Self-pay | Admitting: Cardiology

## 2014-09-22 ENCOUNTER — Ambulatory Visit (INDEPENDENT_AMBULATORY_CARE_PROVIDER_SITE_OTHER): Payer: Medicare Other | Admitting: Cardiology

## 2014-09-22 VITALS — BP 192/103 | HR 71 | Ht <= 58 in | Wt 125.8 lb

## 2014-09-22 DIAGNOSIS — I371 Nonrheumatic pulmonary valve insufficiency: Secondary | ICD-10-CM | POA: Diagnosis not present

## 2014-09-22 NOTE — Progress Notes (Signed)
Patient ID: Catherine Mccullough, female   DOB: 1945/10/13, 69 y.o.   MRN: [id number]     Clinical Summary Catherine Mccullough is a 69 y.o.female seen today for follow up of the following medical problems.   1. Pulmonic insufficiency - has been followed at [facility] for reported severe pulmonic regurgitation - abdominal distension can occur after eating. SOB. + LE edema.  - DOE has worsened, cannot walk all the way down her apartment hallway without stopping. Can be SOB with walking room to room at her apartment. - home weights 120-125 lbs and stable  2. Cellulitis - admitted end of August at [facility] with cellulitis on leg and AMS. AMS thought to be due to infection and recent increase in perococet use.  3. HTN - checks 1-2 times per week -compliant with meds   Past Medical History  Diagnosis Date  . Kidney disease   . Hypertension   . Diabetes mellitus   . 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 Arthritis   . CHF (congestive heart failure)   . Lymphedema of leg   . Anxiety   . Pulmonary hypertension   . 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 Pancreatitis   . Depression   . Anemia   . Shingles   . PUD (peptic ulcer disease)   . GERD (gastroesophageal reflux disease)   . MRSA (methicillin resistant staph aureus) culture positive      Allergies  Allergen Reactions  . Mometasone Shortness Of Breath  . Vancomycin Itching  . Aspirin Other (See Comments)    "Inflames stomach"  . Contrast Media [Iodinated Diagnostic Agents] Other (See Comments)    Made Heart Stop.   . Cortisone Other (See Comments)    Hold fluid  . Doxycycline Nausea And Vomiting  . Ibuprofen Nausea And Vomiting  . Insulins Other (See Comments)    Pt says even the tiniest bit of insulin makes her go unconscious because her BS gets too low  . Lasix [Furosemide] Other (See Comments)    Paradoxical Response  . Other     Iv bp med unknown.and adhesive tape-silicones  . Prednisone      Sweating   . Sulfamethoxazole Other (See Comments)    Bottomed out platelets  . Tape   . Ultram [Tramadol Hcl] Other (See Comments)    "Grand mal seizure"  . Codeine Rash  . Dilantin [Phenytoin Sodium] Rash  . Latex Rash  . Piperacillin Sod-Tazobactam So Rash     Current Outpatient Prescriptions  Medication Sig Dispense Refill  . bumetanide (BUMEX) 1 MG tablet Take 1 tablet (1 mg total) by mouth daily. 30 tablet 0  . cetirizine (ZYRTEC) 10 MG tablet Take 5 mg by mouth daily as needed for allergies.    . citalopram (CELEXA) 10 MG tablet Take 1 tablet by mouth daily.    . clonazePAM (KLONOPIN) 0.5 MG tablet Take 1 tablet by mouth twice daily x 30 days. (use for Klonopin) (Patient taking differently: Take 0.5 mg by mouth 2 (two) times daily. ) 30 tablet 0  . docusate sodium (COLACE) 100 MG capsule Take 100 mg by mouth 2 (two) times daily.    Marland Kitchen esomeprazole (NEXIUM) 40 MG capsule Take 40 mg by mouth daily before breakfast.      . exemestane (AROMASIN) 25 MG tablet Take 25 mg by mouth daily.      . fluticasone (FLONASE) 50 MCG/ACT nasal spray Place 1  spray into both nostrils daily as needed for allergies.     Marland Kitchen LYRICA 75 MG capsule Take 75 mg by mouth 3 (three) times daily.    . magnesium oxide (MAG-OX) 400 MG tablet Take 400 mg by mouth daily.    . Melatonin 3 MG CAPS Take 3 mg by mouth at bedtime.    . metoprolol succinate (TOPROL-XL) 100 MG 24 hr tablet Take 1 tablet (100 mg total) by mouth daily. Take with or immediately following a meal. 30 tablet 0  . montelukast (SINGULAIR) 10 MG tablet Take 10 mg by mouth at bedtime.      . Multiple Vitamins-Minerals (MULTIVITAMINS THER. W/MINERALS) TABS Take 1 tablet by mouth daily.      Marland Kitchen oxyCODONE-acetaminophen (PERCOCET) 10-325 MG per tablet Take 1 tablet by mouth every 6 (six) hours as needed for pain. (Patient taking differently: Take 1 tablet by mouth every 4 (four) hours as needed for pain. ) 20 tablet 0  . polyethylene glycol (MIRALAX /  GLYCOLAX) packet Take 17 g by mouth daily as needed (constipation).     Marland Kitchen PROAIR HFA 108 (90 BASE) MCG/ACT inhaler Take 2 puffs by mouth every 6 (six) hours as needed.    . sodium bicarbonate 650 MG tablet Take 1 tablet (650 mg total) by mouth 2 (two) times daily.     No current facility-administered medications for this visit.     Past Surgical History  Procedure Laterality Date  . Breast surgery    . Cholecystectomy    . Abdominal hysterectomy    . Replacement total knee bilateral    . Hand tendon surgery       Allergies  Allergen Reactions  . Mometasone Shortness Of Breath  . Vancomycin Itching  . Aspirin Other (See Comments)    "Inflames stomach"  . Contrast Media [Iodinated Diagnostic Agents] Other (See Comments)    Made Heart Stop.   . Cortisone Other (See Comments)    Hold fluid  . Doxycycline Nausea And Vomiting  . Ibuprofen Nausea And Vomiting  . Insulins Other (See Comments)    Pt says even the tiniest bit of insulin makes her go unconscious because her BS gets too low  . Lasix [Furosemide] Other (See Comments)    Paradoxical Response  . Other     Iv bp med unknown.and adhesive tape-silicones  . Prednisone     Sweating   . Sulfamethoxazole Other (See Comments)    Bottomed out platelets  . Tape   . Ultram [Tramadol Hcl] Other (See Comments)    "Grand mal seizure"  . Codeine Rash  . Dilantin [Phenytoin Sodium] Rash  . Latex Rash  . Piperacillin Sod-Tazobactam So Rash      Family History  Problem Relation Age of Onset  . Cancer Paternal Grandfather   . Diabetes Sister   . Diabetes Sister   . Heart disease Maternal Grandmother   . Heart disease Father   . Hyperlipidemia       Social History Catherine Mccullough reports that she has never smoked. She has never used smokeless tobacco. Catherine Mccullough reports that she does not drink alcohol.   Review of Systems CONSTITUTIONAL: No weight loss, fever, chills, weakness or fatigue.  HEENT: Eyes: No visual loss,  blurred vision, double vision or yellow sclerae.No hearing loss, sneezing, congestion, runny nose or sore throat.  SKIN: No rash or itching.  CARDIOVASCULAR: per HPI RESPIRATORY: No shortness of breath, cough or sputum.  GASTROINTESTINAL: No anorexia, nausea, vomiting or  diarrhea. No abdominal pain or blood.  GENITOURINARY: No burning on urination, no polyuria NEUROLOGICAL: No headache, dizziness, syncope, paralysis, ataxia, numbness or tingling in the extremities. No change in bowel or bladder control.  MUSCULOSKELETAL: No muscle, back pain, joint pain or stiffness.  LYMPHATICS: No enlarged nodes. No history of splenectomy.  PSYCHIATRIC: No history of depression or anxiety.  ENDOCRINOLOGIC: No reports of sweating, cold or heat intolerance. No polyuria or polydipsia.  Marland Kitchen   Physical Examination Filed Vitals:   09/22/14 1000  BP: 192/103  Pulse: 71   Filed Vitals:   09/22/14 1000  Height: 4' 7"  (1.397 m)  Weight: 125 lb 12.8 oz (57.063 kg)  Manual bp 130/70  Gen: resting comfortably, no acute distress HEENT: no scleral icterus, pupils equal round and reactive, no palptable cervical adenopathy,  CV: RRR, 2/6 systolic murmur LLSB, no JVD Resp: Clear to auscultation bilaterally GI: abdomen is soft, non-tender, non-distended, normal bowel sounds, no hepatosplenomegaly MSK: extremities are warm, no edema.  Skin: warm, no rash Neuro:  no focal deficits Psych: appropriate affect   Diagnostic Studies 08/2014 echo Study Conclusions  - Left ventricle: The cavity size was normal. Wall thickness was normal. Systolic function was normal. The estimated ejection fraction was in the range of 60% to 65%. Wall motion was normal; there were no regional wall motion abnormalities. Left ventricular diastolic function parameters were normal. - Aortic valve: Mildly to moderately calcified annulus. Trileaflet; mildly thickened leaflets. Valve area (VTI): 1.6 cm^2. Valve area (Vmax):  1.56 cm^2. - Mitral valve: Mildly calcified annulus. Normal thickness leaflets . - Right ventricle: The cavity size was moderately dilated. - Right atrium: The interatrial septum bows to the left consistent with elevated RA pressure. The atrium was mildly to moderately dilated. - Tricuspid valve: There was mild-moderate regurgitation. The TR vena contracta is 0.3 cm. - Pulmonic valve: Not well visualized. There is at least moderate regurgitation. There is no significant stenosis. - Pulmonary arteries: Systolic pressure was mildly increased. PA peak pressure: 35 mm Hg (S). - Systemic veins: IVC is normal size with decreased respiratory variation. Estimated RA pressure is 8 mmHg. - Technically difficult study.    Assessment and Plan  1. Pulmonic insufficiency - followed at [facility] - recent admit at [facility] with sepsis. TTE at that time poor visualization of pulmonary valve, at least moderate PI. Signs of RV enlargement - further workup per [facility] cards, agree with consideration for cardiac MRI. Her renal function has significantly improved so MRI would be possible. Her dye allergy appears to only be to CT dye, she has had prior MRI with contrast in our system (at least 2)       Arnoldo Lenis, M.D.

## 2014-09-22 NOTE — Patient Instructions (Signed)
Your physician recommends that you schedule a follow-up appointment TO BE DETERMINED AFTER DR. Curtis  Your physician recommends that you continue on your current medications as directed. Please refer to the Current Medication list given to you today.  Thank you for choosing [facility]!!

## 2014-10-05 ENCOUNTER — Telehealth: Payer: Self-pay | Admitting: Cardiology

## 2014-10-31 ENCOUNTER — Telehealth: Payer: Self-pay | Admitting: Cardiology

## 2014-11-07 DIAGNOSIS — D62 Acute posthemorrhagic anemia: Secondary | ICD-10-CM

## 2014-11-07 DIAGNOSIS — J9601 Acute respiratory failure with hypoxia: Secondary | ICD-10-CM

## 2014-11-07 DIAGNOSIS — G40901 Epilepsy, unspecified, not intractable, with status epilepticus: Secondary | ICD-10-CM

## 2014-11-07 DIAGNOSIS — K226 Gastro-esophageal laceration-hemorrhage syndrome: Secondary | ICD-10-CM

## 2014-11-07 HISTORY — DX: Acute posthemorrhagic anemia: D62

## 2014-11-07 HISTORY — DX: Epilepsy, unspecified, not intractable, with status epilepticus: G40.901

## 2014-11-07 HISTORY — DX: Gastro-esophageal laceration-hemorrhage syndrome: K22.6

## 2014-11-07 HISTORY — DX: Acute respiratory failure with hypoxia: J96.01

## 2014-11-17 ENCOUNTER — Telehealth: Payer: Self-pay | Admitting: Cardiology

## 2014-12-03 ENCOUNTER — Encounter (HOSPITAL_COMMUNITY)
Admission: EM | Disposition: A | Discharge status: Home Health | Payer: Medicare Other | Source: Home / Self Care | Attending: Internal Medicine

## 2014-12-03 ENCOUNTER — Inpatient Hospital Stay (HOSPITAL_COMMUNITY): Payer: Medicare Other

## 2014-12-03 ENCOUNTER — Encounter (HOSPITAL_COMMUNITY)
Admission: EM | Disposition: A | Discharge status: Home Health | Payer: Self-pay | Source: Home / Self Care | Attending: Internal Medicine

## 2014-12-03 ENCOUNTER — Encounter (HOSPITAL_COMMUNITY): Payer: Self-pay | Admitting: *Deleted

## 2014-12-03 ENCOUNTER — Emergency Department (HOSPITAL_COMMUNITY): Payer: Medicare Other

## 2014-12-03 ENCOUNTER — Inpatient Hospital Stay (HOSPITAL_COMMUNITY)
Admission: EM | Admit: 2014-12-03 | Discharge: 2014-12-18 | DRG: 100 | Disposition: A | Discharge status: Home Health | Payer: Medicare Other | Attending: Internal Medicine | Admitting: Internal Medicine

## 2014-12-03 DIAGNOSIS — Z66 Do not resuscitate: Secondary | ICD-10-CM | POA: Diagnosis present

## 2014-12-03 DIAGNOSIS — I5043 Acute on chronic combined systolic (congestive) and diastolic (congestive) heart failure: Secondary | ICD-10-CM | POA: Diagnosis present

## 2014-12-03 DIAGNOSIS — D539 Nutritional anemia, unspecified: Secondary | ICD-10-CM

## 2014-12-03 DIAGNOSIS — E860 Dehydration: Secondary | ICD-10-CM

## 2014-12-03 DIAGNOSIS — E8809 Other disorders of plasma-protein metabolism, not elsewhere classified: Secondary | ICD-10-CM | POA: Diagnosis present

## 2014-12-03 DIAGNOSIS — C7951 Secondary malignant neoplasm of bone: Secondary | ICD-10-CM | POA: Diagnosis present

## 2014-12-03 DIAGNOSIS — R197 Diarrhea, unspecified: Secondary | ICD-10-CM | POA: Diagnosis not present

## 2014-12-03 DIAGNOSIS — R109 Unspecified abdominal pain: Secondary | ICD-10-CM

## 2014-12-03 DIAGNOSIS — K529 Noninfective gastroenteritis and colitis, unspecified: Secondary | ICD-10-CM | POA: Diagnosis not present

## 2014-12-03 DIAGNOSIS — Z8249 Family history of ischemic heart disease and other diseases of the circulatory system: Secondary | ICD-10-CM

## 2014-12-03 DIAGNOSIS — D62 Acute posthemorrhagic anemia: Secondary | ICD-10-CM | POA: Diagnosis present

## 2014-12-03 DIAGNOSIS — N179 Acute kidney failure, unspecified: Secondary | ICD-10-CM

## 2014-12-03 DIAGNOSIS — E1122 Type 2 diabetes mellitus with diabetic chronic kidney disease: Secondary | ICD-10-CM | POA: Diagnosis present

## 2014-12-03 DIAGNOSIS — R4182 Altered mental status, unspecified: Secondary | ICD-10-CM | POA: Diagnosis not present

## 2014-12-03 DIAGNOSIS — A419 Sepsis, unspecified organism: Secondary | ICD-10-CM

## 2014-12-03 DIAGNOSIS — J9601 Acute respiratory failure with hypoxia: Secondary | ICD-10-CM

## 2014-12-03 DIAGNOSIS — I5021 Acute systolic (congestive) heart failure: Secondary | ICD-10-CM

## 2014-12-03 DIAGNOSIS — J9602 Acute respiratory failure with hypercapnia: Secondary | ICD-10-CM | POA: Diagnosis present

## 2014-12-03 DIAGNOSIS — N184 Chronic kidney disease, stage 4 (severe): Secondary | ICD-10-CM

## 2014-12-03 DIAGNOSIS — J984 Other disorders of lung: Secondary | ICD-10-CM

## 2014-12-03 DIAGNOSIS — Z809 Family history of malignant neoplasm, unspecified: Secondary | ICD-10-CM

## 2014-12-03 DIAGNOSIS — Z09 Encounter for follow-up examination after completed treatment for conditions other than malignant neoplasm: Secondary | ICD-10-CM

## 2014-12-03 DIAGNOSIS — I4891 Unspecified atrial fibrillation: Secondary | ICD-10-CM | POA: Diagnosis not present

## 2014-12-03 DIAGNOSIS — I480 Paroxysmal atrial fibrillation: Secondary | ICD-10-CM | POA: Diagnosis not present

## 2014-12-03 DIAGNOSIS — R569 Unspecified convulsions: Secondary | ICD-10-CM | POA: Diagnosis present

## 2014-12-03 DIAGNOSIS — R29898 Other symptoms and signs involving the musculoskeletal system: Secondary | ICD-10-CM

## 2014-12-03 DIAGNOSIS — Z8679 Personal history of other diseases of the circulatory system: Secondary | ICD-10-CM

## 2014-12-03 DIAGNOSIS — R40243 Glasgow coma scale score 3-8, unspecified time: Secondary | ICD-10-CM | POA: Diagnosis not present

## 2014-12-03 DIAGNOSIS — T68XXXA Hypothermia, initial encounter: Secondary | ICD-10-CM

## 2014-12-03 DIAGNOSIS — E872 Acidosis, unspecified: Secondary | ICD-10-CM

## 2014-12-03 DIAGNOSIS — K219 Gastro-esophageal reflux disease without esophagitis: Secondary | ICD-10-CM | POA: Diagnosis present

## 2014-12-03 DIAGNOSIS — Z515 Encounter for palliative care: Secondary | ICD-10-CM

## 2014-12-03 DIAGNOSIS — E876 Hypokalemia: Secondary | ICD-10-CM | POA: Diagnosis present

## 2014-12-03 DIAGNOSIS — I272 Other secondary pulmonary hypertension: Secondary | ICD-10-CM | POA: Diagnosis present

## 2014-12-03 DIAGNOSIS — M75101 Unspecified rotator cuff tear or rupture of right shoulder, not specified as traumatic: Secondary | ICD-10-CM | POA: Diagnosis present

## 2014-12-03 DIAGNOSIS — D696 Thrombocytopenia, unspecified: Secondary | ICD-10-CM | POA: Diagnosis present

## 2014-12-03 DIAGNOSIS — Z8614 Personal history of Methicillin resistant Staphylococcus aureus infection: Secondary | ICD-10-CM

## 2014-12-03 DIAGNOSIS — Z7189 Other specified counseling: Secondary | ICD-10-CM | POA: Diagnosis not present

## 2014-12-03 DIAGNOSIS — Z833 Family history of diabetes mellitus: Secondary | ICD-10-CM | POA: Diagnosis not present

## 2014-12-03 DIAGNOSIS — R402432 Glasgow coma scale score 3-8, at arrival to emergency department: Secondary | ICD-10-CM | POA: Diagnosis not present

## 2014-12-03 DIAGNOSIS — A09 Infectious gastroenteritis and colitis, unspecified: Secondary | ICD-10-CM

## 2014-12-03 DIAGNOSIS — G8929 Other chronic pain: Secondary | ICD-10-CM

## 2014-12-03 DIAGNOSIS — K226 Gastro-esophageal laceration-hemorrhage syndrome: Secondary | ICD-10-CM

## 2014-12-03 DIAGNOSIS — M12811 Other specific arthropathies, not elsewhere classified, right shoulder: Secondary | ICD-10-CM

## 2014-12-03 DIAGNOSIS — R6521 Severe sepsis with septic shock: Secondary | ICD-10-CM

## 2014-12-03 DIAGNOSIS — K2960 Other gastritis without bleeding: Secondary | ICD-10-CM | POA: Diagnosis present

## 2014-12-03 DIAGNOSIS — M1990 Unspecified osteoarthritis, unspecified site: Secondary | ICD-10-CM | POA: Diagnosis present

## 2014-12-03 DIAGNOSIS — J9690 Respiratory failure, unspecified, unspecified whether with hypoxia or hypercapnia: Secondary | ICD-10-CM

## 2014-12-03 DIAGNOSIS — I130 Hypertensive heart and chronic kidney disease with heart failure and stage 1 through stage 4 chronic kidney disease, or unspecified chronic kidney disease: Secondary | ICD-10-CM | POA: Diagnosis present

## 2014-12-03 DIAGNOSIS — G40901 Epilepsy, unspecified, not intractable, with status epilepticus: Secondary | ICD-10-CM | POA: Diagnosis present

## 2014-12-03 DIAGNOSIS — G9340 Encephalopathy, unspecified: Secondary | ICD-10-CM | POA: Diagnosis not present

## 2014-12-03 DIAGNOSIS — I50810 Right heart failure, unspecified: Secondary | ICD-10-CM

## 2014-12-03 DIAGNOSIS — F40240 Claustrophobia: Secondary | ICD-10-CM | POA: Diagnosis present

## 2014-12-03 DIAGNOSIS — R40242 Glasgow coma scale score 9-12, unspecified time: Secondary | ICD-10-CM | POA: Diagnosis not present

## 2014-12-03 DIAGNOSIS — E7220 Disorder of urea cycle metabolism, unspecified: Secondary | ICD-10-CM

## 2014-12-03 DIAGNOSIS — C50919 Malignant neoplasm of unspecified site of unspecified female breast: Secondary | ICD-10-CM | POA: Diagnosis present

## 2014-12-03 DIAGNOSIS — M6281 Muscle weakness (generalized): Secondary | ICD-10-CM

## 2014-12-03 DIAGNOSIS — I10 Essential (primary) hypertension: Secondary | ICD-10-CM

## 2014-12-03 DIAGNOSIS — Z9010 Acquired absence of unspecified breast and nipple: Secondary | ICD-10-CM | POA: Diagnosis not present

## 2014-12-03 HISTORY — DX: Diarrhea, unspecified: R19.7

## 2014-12-03 HISTORY — DX: Acidosis: E87.2

## 2014-12-03 HISTORY — PX: ESOPHAGOGASTRODUODENOSCOPY: SHX5428

## 2014-12-03 HISTORY — DX: Epilepsy, unspecified, not intractable, with status epilepticus: G40.901

## 2014-12-03 HISTORY — DX: Acute posthemorrhagic anemia: D62

## 2014-12-03 HISTORY — DX: Gastro-esophageal laceration-hemorrhage syndrome: K22.6

## 2014-12-03 HISTORY — DX: Acute respiratory failure with hypoxia: J96.01

## 2014-12-03 HISTORY — DX: Fibromyalgia: M79.7

## 2014-12-03 HISTORY — DX: Acute systolic (congestive) heart failure: I50.21

## 2014-12-03 HISTORY — DX: Other specific arthropathies, not elsewhere classified, right shoulder: M12.811

## 2014-12-03 HISTORY — DX: Unspecified rotator cuff tear or rupture of right shoulder, not specified as traumatic: M75.101

## 2014-12-03 LAB — ETHANOL

## 2014-12-03 LAB — CBC WITH DIFFERENTIAL/PLATELET
BASOS ABS: 0 10*3/uL (ref 0.0–0.1)
Basophils Relative: 0 %
EOS ABS: 0 10*3/uL (ref 0.0–0.7)
Eosinophils Relative: 0 %
HEMATOCRIT: 40.5 % (ref 36.0–46.0)
HEMOGLOBIN: 13.7 g/dL (ref 12.0–15.0)
LYMPHS PCT: 4 %
Lymphs Abs: 0.7 10*3/uL (ref 0.7–4.0)
MCH: 35.2 pg — ABNORMAL HIGH (ref 26.0–34.0)
MCHC: 33.8 g/dL (ref 30.0–36.0)
MCV: 104.1 fL — ABNORMAL HIGH (ref 78.0–100.0)
MONOS PCT: 5 %
Monocytes Absolute: 0.9 10*3/uL (ref 0.1–1.0)
NEUTROS ABS: 17 10*3/uL — AB (ref 1.7–7.7)
NEUTROS PCT: 91 %
Platelets: 205 10*3/uL (ref 150–400)
RBC: 3.89 MIL/uL (ref 3.87–5.11)
RDW: 21 % — ABNORMAL HIGH (ref 11.5–15.5)
WBC: 18.6 10*3/uL — AB (ref 4.0–10.5)

## 2014-12-03 LAB — COMPREHENSIVE METABOLIC PANEL
ALBUMIN: 4.5 g/dL (ref 3.5–5.0)
ALK PHOS: 107 U/L (ref 38–126)
ALT: 20 U/L (ref 14–54)
ANION GAP: 18 — AB (ref 5–15)
AST: 40 U/L (ref 15–41)
BILIRUBIN TOTAL: 0.8 mg/dL (ref 0.3–1.2)
BUN: 66 mg/dL — ABNORMAL HIGH (ref 6–20)
CALCIUM: 10.6 mg/dL — AB (ref 8.9–10.3)
CO2: 5 mmol/L — ABNORMAL LOW (ref 22–32)
Chloride: 124 mmol/L — ABNORMAL HIGH (ref 101–111)
Creatinine, Ser: 2.47 mg/dL — ABNORMAL HIGH (ref 0.44–1.00)
GFR, EST AFRICAN AMERICAN: 22 mL/min — AB (ref >60)
GFR, EST NON AFRICAN AMERICAN: 19 mL/min — AB (ref >60)
GLUCOSE: 115 mg/dL — AB (ref 65–99)
POTASSIUM: 5.2 mmol/L — AB (ref 3.5–5.1)
Sodium: 147 mmol/L — ABNORMAL HIGH (ref 135–145)
TOTAL PROTEIN: 8.3 g/dL — AB (ref 6.5–8.1)

## 2014-12-03 LAB — CSF CELL COUNT WITH DIFFERENTIAL
RBC Count, CSF: 3 /mm3 — ABNORMAL HIGH
Tube #: 4
WBC, CSF: 2 /mm3 (ref 0–5)

## 2014-12-03 LAB — URINALYSIS, ROUTINE W REFLEX MICROSCOPIC
Bilirubin Urine: NEGATIVE
Glucose, UA: NEGATIVE mg/dL
KETONES UR: NEGATIVE mg/dL
LEUKOCYTES UA: NEGATIVE
NITRITE: NEGATIVE
PH: 5 (ref 5.0–8.0)
Protein, ur: 30 mg/dL — AB
SPECIFIC GRAVITY, URINE: 1.025 (ref 1.005–1.030)

## 2014-12-03 LAB — RAPID URINE DRUG SCREEN, HOSP PERFORMED
AMPHETAMINES: NOT DETECTED
BENZODIAZEPINES: NOT DETECTED
Barbiturates: NOT DETECTED
COCAINE: NOT DETECTED
OPIATES: NOT DETECTED
Tetrahydrocannabinol: NOT DETECTED

## 2014-12-03 LAB — PREPARE RBC (CROSSMATCH)

## 2014-12-03 LAB — CRYPTOCOCCAL ANTIGEN, CSF: Crypto Ag: NEGATIVE

## 2014-12-03 LAB — BLOOD GAS, ARTERIAL
ACID-BASE DEFICIT: 18.1 mmol/L — AB (ref 0.0–2.0)
Acid-base deficit: 26 mmol/L — ABNORMAL HIGH (ref 0.0–2.0)
BICARBONATE: 10.8 meq/L — AB (ref 20.0–24.0)
Bicarbonate: 5.9 mEq/L — ABNORMAL LOW (ref 20.0–24.0)
DRAWN BY: 23534
Drawn by: 105551
FIO2: 0.21
FIO2: 0.6
LHR: 18 {breaths}/min
O2 CONTENT: 21 L/min
O2 SAT: 97.4 %
O2 Saturation: 99.7 %
PEEP/CPAP: 5 cmH2O
PO2 ART: 129 mmHg — AB (ref 80.0–100.0)
Patient temperature: 37.6
VT: 500 mL
pCO2 arterial: 12.1 mmHg — CL (ref 35.0–45.0)
pCO2 arterial: 21.9 mmHg — ABNORMAL LOW (ref 35.0–45.0)
pH, Arterial: 7.036 — CL (ref 7.350–7.450)
pH, Arterial: 7.208 — ABNORMAL LOW (ref 7.350–7.450)
pO2, Arterial: 298 mmHg — ABNORMAL HIGH (ref 80.0–100.0)

## 2014-12-03 LAB — I-STAT CG4 LACTIC ACID, ED: Lactic Acid, Venous: 1.23 mmol/L (ref 0.5–2.0)

## 2014-12-03 LAB — GRAM STAIN: Gram Stain: NONE SEEN

## 2014-12-03 LAB — LACTIC ACID, PLASMA: Lactic Acid, Venous: 2.4 mmol/L (ref 0.5–2.0)

## 2014-12-03 LAB — APTT: aPTT: 47 seconds — ABNORMAL HIGH (ref 24–37)

## 2014-12-03 LAB — OSMOLALITY: OSMOLALITY: 330 mosm/kg — AB (ref 275–295)

## 2014-12-03 LAB — ACETAMINOPHEN LEVEL

## 2014-12-03 LAB — CK: CK TOTAL: 356 U/L — AB (ref 38–234)

## 2014-12-03 LAB — CBG MONITORING, ED: Glucose-Capillary: 97 mg/dL (ref 65–99)

## 2014-12-03 LAB — URINE MICROSCOPIC-ADD ON
Squamous Epithelial / LPF: NONE SEEN
WBC, UA: NONE SEEN WBC/hpf (ref 0–5)

## 2014-12-03 LAB — GLUCOSE, CSF: GLUCOSE CSF: 82 mg/dL — AB (ref 40–70)

## 2014-12-03 LAB — PROTIME-INR
INR: 1.54 — ABNORMAL HIGH (ref 0.00–1.49)
Prothrombin Time: 18.5 seconds — ABNORMAL HIGH (ref 11.6–15.2)

## 2014-12-03 LAB — PROTEIN, CSF

## 2014-12-03 LAB — SALICYLATE LEVEL

## 2014-12-03 LAB — AMMONIA: Ammonia: 40 umol/L — ABNORMAL HIGH (ref 9–35)

## 2014-12-03 SURGERY — EGD (ESOPHAGOGASTRODUODENOSCOPY)
Anesthesia: Moderate Sedation

## 2014-12-03 MED ORDER — PANTOPRAZOLE SODIUM 40 MG IV SOLR
INTRAVENOUS | Status: AC
  Filled 2014-12-03: qty 160

## 2014-12-03 MED ORDER — MIDAZOLAM HCL 2 MG/2ML IJ SOLN
1.0000 mg | INTRAMUSCULAR | Status: DC | PRN
  Administered 2014-12-05 – 2014-12-07 (×11): 1 mg via INTRAVENOUS
  Filled 2014-12-03 (×11): qty 2

## 2014-12-03 MED ORDER — PIPERACILLIN-TAZOBACTAM 3.375 G IVPB
3.3750 g | Freq: Three times a day (TID) | INTRAVENOUS | Status: DC
  Administered 2014-12-04: 3.375 g via INTRAVENOUS
  Filled 2014-12-03 (×2): qty 50

## 2014-12-03 MED ORDER — PANTOPRAZOLE SODIUM 40 MG IV SOLR
8.0000 mg/h | INTRAVENOUS | Status: DC
  Administered 2014-12-03 – 2014-12-04 (×2): 8 mg/h via INTRAVENOUS
  Filled 2014-12-03 (×5): qty 80

## 2014-12-03 MED ORDER — DEXTROSE 5 % IV SOLN
1.0000 g | Freq: Three times a day (TID) | INTRAVENOUS | Status: DC
  Administered 2014-12-04: 1 g via INTRAVENOUS
  Filled 2014-12-03 (×2): qty 1

## 2014-12-03 MED ORDER — LEVETIRACETAM IN NACL 1000 MG/100ML IV SOLN
1000.0000 mg | Freq: Once | INTRAVENOUS | Status: AC
  Administered 2014-12-03: 1000 mg via INTRAVENOUS
  Filled 2014-12-03: qty 100

## 2014-12-03 MED ORDER — SODIUM CHLORIDE 0.9 % IV BOLUS (SEPSIS)
1000.0000 mL | Freq: Once | INTRAVENOUS | Status: AC
  Administered 2014-12-03: 1000 mL via INTRAVENOUS

## 2014-12-03 MED ORDER — LORAZEPAM 2 MG/ML IJ SOLN
INTRAMUSCULAR | Status: AC
  Filled 2014-12-03: qty 1

## 2014-12-03 MED ORDER — SODIUM BICARBONATE 8.4 % IV SOLN
INTRAVENOUS | Status: AC
  Administered 2014-12-03: 150 meq
  Filled 2014-12-03: qty 150

## 2014-12-03 MED ORDER — MIDAZOLAM HCL 2 MG/2ML IJ SOLN
1.0000 mg | INTRAMUSCULAR | Status: DC | PRN
  Administered 2014-12-05: 1 mg via INTRAVENOUS
  Filled 2014-12-03: qty 2

## 2014-12-03 MED ORDER — SODIUM CHLORIDE 0.9 % IJ SOLN
3.0000 mL | Freq: Two times a day (BID) | INTRAMUSCULAR | Status: DC
  Administered 2014-12-04 – 2014-12-07 (×8): 3 mL via INTRAVENOUS
  Administered 2014-12-08: 40 mL via INTRAVENOUS
  Administered 2014-12-08 – 2014-12-10 (×5): 3 mL via INTRAVENOUS
  Administered 2014-12-11: 10 mL via INTRAVENOUS
  Administered 2014-12-11 – 2014-12-18 (×8): 3 mL via INTRAVENOUS

## 2014-12-03 MED ORDER — AZTREONAM 1 G IJ SOLR
INTRAMUSCULAR | Status: AC
  Filled 2014-12-03: qty 1

## 2014-12-03 MED ORDER — SODIUM CHLORIDE 0.9 % IV SOLN
Freq: Once | INTRAVENOUS | Status: AC
  Administered 2014-12-03: 1000 mL via INTRAVENOUS

## 2014-12-03 MED ORDER — SODIUM CHLORIDE 0.9 % IV BOLUS (SEPSIS)
1500.0000 mL | Freq: Once | INTRAVENOUS | Status: AC
  Administered 2014-12-03: 1500 mL via INTRAVENOUS

## 2014-12-03 MED ORDER — PANTOPRAZOLE SODIUM 40 MG IV SOLR: 40.0000 mg | Freq: Two times a day (BID) | INTRAVENOUS | Status: DC

## 2014-12-03 MED ORDER — METOCLOPRAMIDE HCL 5 MG/ML IJ SOLN
INTRAMUSCULAR | Status: AC
Start: 2014-12-03 — End: 2014-12-03
  Administered 2014-12-03: 10 mg
  Filled 2014-12-03: qty 2

## 2014-12-03 MED ORDER — LINEZOLID 600 MG/300ML IV SOLN
INTRAVENOUS | Status: AC
  Filled 2014-12-03: qty 300

## 2014-12-03 MED ORDER — FENTANYL CITRATE (PF) 100 MCG/2ML IJ SOLN
50.0000 ug | INTRAMUSCULAR | Status: DC | PRN
  Administered 2014-12-05 – 2014-12-06 (×10): 50 ug via INTRAVENOUS
  Filled 2014-12-03 (×10): qty 2

## 2014-12-03 MED ORDER — LINEZOLID 600 MG/300ML IV SOLN
600.0000 mg | Freq: Once | INTRAVENOUS | Status: AC
  Administered 2014-12-03: 600 mg via INTRAVENOUS
  Filled 2014-12-03: qty 300

## 2014-12-03 MED ORDER — ETOMIDATE 2 MG/ML IV SOLN
0.3000 mg/kg | Freq: Once | INTRAVENOUS | Status: AC
  Administered 2014-12-03: 17.02 mg via INTRAVENOUS

## 2014-12-03 MED ORDER — LORAZEPAM 2 MG/ML IJ SOLN
0.5000 mg | Freq: Once | INTRAMUSCULAR | Status: AC
  Administered 2014-12-03: 0.5 mg via INTRAVENOUS

## 2014-12-03 MED ORDER — SODIUM CHLORIDE 0.9 % IV SOLN: Freq: Once | INTRAVENOUS | Status: DC

## 2014-12-03 MED ORDER — SODIUM BICARBONATE 8.4 % IV SOLN
INTRAVENOUS | Status: AC
  Filled 2014-12-03: qty 150

## 2014-12-03 MED ORDER — PANTOPRAZOLE SODIUM 40 MG IV SOLR
80.0000 mg | Freq: Once | INTRAVENOUS | Status: AC
  Administered 2014-12-03: 80 mg via INTRAVENOUS
  Filled 2014-12-03: qty 80

## 2014-12-03 MED ORDER — FENTANYL CITRATE (PF) 100 MCG/2ML IJ SOLN
50.0000 ug | INTRAMUSCULAR | Status: DC | PRN
  Administered 2014-12-04 – 2014-12-05 (×2): 50 ug via INTRAVENOUS
  Filled 2014-12-03 (×2): qty 2

## 2014-12-03 MED ORDER — PHENYLEPHRINE HCL 10 MG/ML IJ SOLN
0.0000 ug/min | INTRAMUSCULAR | Status: DC
  Administered 2014-12-03: 20 ug/min via INTRAVENOUS
  Administered 2014-12-04: 5 ug/min via INTRAVENOUS
  Filled 2014-12-03: qty 1

## 2014-12-03 MED ORDER — EPINEPHRINE HCL 0.1 MG/ML IJ SOSY
PREFILLED_SYRINGE | INTRAMUSCULAR | Status: AC
  Filled 2014-12-03: qty 20

## 2014-12-03 MED ORDER — PHENYLEPHRINE HCL 10 MG/ML IJ SOLN
INTRAMUSCULAR | Status: AC
  Filled 2014-12-03: qty 1

## 2014-12-03 MED ORDER — EPINEPHRINE HCL 1 MG/ML IJ SOLN
INTRAMUSCULAR | Status: AC
  Filled 2014-12-03: qty 1

## 2014-12-03 MED ORDER — LORAZEPAM 2 MG/ML IJ SOLN
INTRAMUSCULAR | Status: AC
  Administered 2014-12-03: 0.5 mg via INTRAVENOUS
  Filled 2014-12-03: qty 1

## 2014-12-03 MED ORDER — DEXTROSE 5 % IV SOLN
2.0000 g | Freq: Once | INTRAVENOUS | Status: AC
Start: 2014-12-03 — End: 2014-12-03
  Administered 2014-12-03: 2 g via INTRAVENOUS
  Filled 2014-12-03: qty 2

## 2014-12-03 MED ORDER — LINEZOLID 600 MG/300ML IV SOLN
600.0000 mg | Freq: Two times a day (BID) | INTRAVENOUS | Status: DC
  Administered 2014-12-04 – 2014-12-06 (×5): 600 mg via INTRAVENOUS
  Filled 2014-12-03 (×8): qty 300

## 2014-12-03 MED ORDER — SODIUM CHLORIDE 0.9 % IV BOLUS (SEPSIS)
500.0000 mL | Freq: Once | INTRAVENOUS | Status: AC
Start: 2014-12-03 — End: 2014-12-03
  Administered 2014-12-03: 500 mL via INTRAVENOUS

## 2014-12-03 MED ORDER — SODIUM BICARBONATE 8.4 % IV SOLN
INTRAVENOUS | Status: DC
  Administered 2014-12-03 – 2014-12-04 (×3): via INTRAVENOUS
  Filled 2014-12-03 (×6): qty 150

## 2014-12-03 MED ORDER — LEVOFLOXACIN IN D5W 750 MG/150ML IV SOLN
750.0000 mg | Freq: Once | INTRAVENOUS | Status: AC
  Administered 2014-12-03: 750 mg via INTRAVENOUS
  Filled 2014-12-03: qty 150

## 2014-12-03 MED ORDER — ROCURONIUM BROMIDE 50 MG/5ML IV SOLN
1.0000 mg/kg | Freq: Once | INTRAVENOUS | Status: AC
  Administered 2014-12-03: 56.7 mg via INTRAVENOUS

## 2014-12-03 MED ORDER — STERILE WATER FOR IRRIGATION IR SOLN
Status: DC | PRN
  Administered 2014-12-03: 21:00:00

## 2014-12-03 MED ORDER — HEPARIN SODIUM (PORCINE) 5000 UNIT/ML IJ SOLN
5000.0000 [IU] | Freq: Three times a day (TID) | INTRAMUSCULAR | Status: DC
  Administered 2014-12-04: 5000 [IU] via SUBCUTANEOUS
  Filled 2014-12-03: qty 1

## 2014-12-03 NOTE — ED Notes (Signed)
Multiple attempts for second IV access unsuccessful. Dr Eulis Foster aware. Attempts made x2 by C Jashae Wiggs RN, x2 by Georgeanne Nim RN, X3 ultrasound guide attempts made by Trena Platt RN.

## 2014-12-03 NOTE — ED Notes (Signed)
Patient noted to have white foam around mouth. EDP made aware. Upon EDP's arrival to room patient started seizing.

## 2014-12-03 NOTE — ED Notes (Signed)
CRITICAL VALUE ALERT  Critical value received: Serum Osmolarity 330  Date of notification:  12/03/2014  Time of notification:  2045   Critical value read back:Yes.    Nurse who received alert:  Fabio Neighbors RN  MD notified (1st page):  Dr. Arnoldo Morale  Time of first page:  2045  Responding MD:  Dr. Arnoldo Morale  Time MD responded:  2045

## 2014-12-03 NOTE — Plan of Care (Signed)
Problem: Education: Goal: Knowledge of [facility] Education information/materials will improve Outcome: Progressing Obtained consent for possible blood transfusions to her son [name] from contact list. Family member educated.

## 2014-12-03 NOTE — ED Notes (Signed)
CRITICAL VALUE ALERT  Critical value received:  Lactic Acid 2.4  Date of notification:  12/03/2014  Time of notification:  [id number]  Critical value read back:Yes.    Nurse who received alert:  Domenica Reamer RN   MD notified (1st page):  Dr Eulis Foster  Time of first page:  1139  MD notified (2nd page):  Time of second page:  Responding MD:  Dr Eulis Foster  Time MD responded:  [phone]

## 2014-12-03 NOTE — ED Notes (Signed)
Patient in room vomiting bright red blood. Dr Arnoldo Morale notified

## 2014-12-03 NOTE — Progress Notes (Signed)
ANTIBIOTIC CONSULT NOTE-Preliminary  Pharmacy Consult for Levaquin and Aztreonam Indication: rule out sepsis  Allergies  Allergen Reactions  . Mometasone Shortness Of Breath  . Vancomycin Itching  . Aspirin Other (See Comments)    "Inflames stomach"  . Contrast Media [Iodinated Diagnostic Agents] Other (See Comments)    Made Heart Stop.   . Cortisone Other (See Comments)    Hold fluid  . Doxycycline Nausea And Vomiting  . Ibuprofen Nausea And Vomiting  . Insulins Other (See Comments)    Pt says even the tiniest bit of insulin makes her go unconscious because her BS gets too low  . Lasix [Furosemide] Other (See Comments)    Paradoxical Response  . Other     Iv bp med unknown.and adhesive tape-silicones  . Prednisone     Sweating   . Sulfamethoxazole Other (See Comments)    Bottomed out platelets  . Tape   . Ultram [Tramadol Hcl] Other (See Comments)    "Grand mal seizure"  . Codeine Rash  . Dilantin [Phenytoin Sodium] Rash  . Latex Rash  . Piperacillin Sod-Tazobactam So Rash    Patient Measurements: Weight: 125 lb (56.7 kg)  Vital Signs: Temp: 90 F (32.2 C) (11/27 1638) Temp Source: Core (Comment) (11/27 1638) BP: 122/50 mmHg (11/27 1638) Pulse Rate: 92 (11/27 1638)  Labs:  Recent Labs  12/03/14 1100  WBC 18.6*  HGB 13.7  PLT 205  CREATININE 2.47*    Estimated Creatinine Clearance: 14.8 mL/min (by C-G formula based on Cr of 2.47).  No results for input(s): VANCOTROUGH, VANCOPEAK, VANCORANDOM, GENTTROUGH, GENTPEAK, GENTRANDOM, TOBRATROUGH, TOBRAPEAK, TOBRARND, AMIKACINPEAK, AMIKACINTROU, AMIKACIN in the last 72 hours.   Microbiology: No results found for this or any previous visit (from the past 720 hour(s)).  Medical History: Past Medical History  Diagnosis Date  . Kidney disease   . Hypertension   . Diabetes mellitus   . 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 Arthritis   . CHF (congestive heart failure) ([place])   . Lymphedema of leg   .  Anxiety   . Pulmonary hypertension ([place])   . 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 Pancreatitis   . Depression   . Anemia   . Shingles   . PUD (peptic ulcer disease)   . GERD (gastroesophageal reflux disease)   . MRSA (methicillin resistant staph aureus) culture positive    Anti-infectives    Start     Dose/Rate Route Frequency Ordered Stop   12/03/14 1415  linezolid (ZYVOX) IVPB 600 mg     600 mg 300 mL/hr over 60 Minutes Intravenous  Once 12/03/14 1341 12/03/14 1557   12/03/14 1345  levofloxacin (LEVAQUIN) IVPB 750 mg     750 mg 100 mL/hr over 90 Minutes Intravenous  Once 12/03/14 1338     12/03/14 1345  aztreonam (AZACTAM) 2 g in dextrose 5 % 50 mL IVPB     2 g 100 mL/hr over 30 Minutes Intravenous  Once 12/03/14 1338 12/03/14 1450     Assessment: Estimated Creatinine Clearance: 14.8 mL/min (by C-G formula based on Cr of 2.47). Patient noted to have white foam around mouth. EDP made aware. Upon EDP's arrival to room patient started seizing.   Goal of Therapy:  Eradicate infection.  Plan:  Preliminary review of pertinent patient information completed.  Protocol will be initiated with a one-time dose(s) of Levaquin 747m and Aztreonam 2gm.  [facility] pharmacist  will complete review during morning rounds to assess patient and finalize treatment regimen.  Hart Robinsons A, [place] 12/03/2014,4:51 PM

## 2014-12-03 NOTE — ED Notes (Signed)
Patient now has eyes open and is mumbling incomprehensible. EDP made aware. Temp now 31.4.

## 2014-12-03 NOTE — ED Notes (Signed)
EMS called out for unresponsive. Pt was found lying on the floor and no one has spoken to the patient since Thursday. Responding to painful stimuli on arrival.

## 2014-12-03 NOTE — Progress Notes (Signed)
[facility] Progress Note Patient Name: HULDA GAUSMAN DOB: May 14, 1945 MRN: [id number]   Date of Service  12/03/2014  HPI/Events of Note  Seizure, altered MS, UGIB - mallory weiss tear, on neo gtt ABG - improving metab acidosis, lactate -resolved  eICU Interventions  No NGT Rechk Hb Increase RR to 20 Ct bicarb gtt Unclear cause of high protein in CSF -ensure CSF sent for HSV pcR     Intervention Category Evaluation Type: New Patient Evaluation  ALVA,RAKESH V. 12/03/2014, 11:39 PM

## 2014-12-03 NOTE — Procedures (Addendum)
MEDICATIONS: none  FINDINGS: 1. Mallory weiss tear with fresh clot in cardia 2. NON-EROSIVE GASTRITIS.  3. Erythema and edema in duodenal bulb, normal second portion of the duodenum.  4. OLD BLOOD IN THE LUMEN of the distal stomach. 5. NO VARICES OR BARRETT'S  RECOMMENDATIONS: 1. PROTONIX bid 2. No NG TUBE FOR 3 DAYS. 3. SUPPORTIVE CARE  2147: SON AWARE OF PATIENT'S POOR PROGNOSIS.  PROCEDURE TECHNIQUE: PT'S ESOPHAGUS WAS INTUBATED. THE SCOPE WAS ADVANCED UNDER DIRECT VISUALIZATION TO THE SECOND PORTION OF THE DUODENUM. 2 CLIPS WERE APPLIED TO MALLORY WEISS TEAR AT [place]. THE SCOPE WAS WITHDRAWN SLOWLY BY CAREFULLY OBSERVING THE MUCOSA. THE PT WAS RECOVERED IN THE ED.

## 2014-12-03 NOTE — ED Notes (Signed)
Lumbar puncture performed by Dr Eulis Foster. Patient tolerated well.

## 2014-12-03 NOTE — ED Notes (Signed)
Central Line attempted by Dr Eulis Foster, unsuccessful-will attempt femoral line after chest x-ray.

## 2014-12-03 NOTE — Consult Note (Addendum)
Referring Provider: No ref. provider found Primary Care Physician:  Default, Provider, MD Primary Gastroenterologist:  DR. Bunny Kleist  Reason for Consultation:  hemorrhagic shock   Impression: ADMITTED WITH MENTAL STATUS CHANGES. DECOMPENSATED IN ED AND HAD HEMATEMESIS.  Plan: 1. EMERGENT EGD AT BEDSIDE TONIGHT 2. SEMI-ELECTIVE INTUBATION 3. pRBCs AND LEVOPHED 4. PROTONIX GTT  0907: SPOKE WITH SON. WILL PERFORM EGD UNDER EMERGENT CONSENT. HE AGREED TO EGD.    HPI:  PT ADMITTED WITH MENTAL STATUS CHANGES AND RECTAL BLEEDING. Pt unable to give history.  Past Medical History  Diagnosis Date  . Kidney disease   . Hypertension   . Diabetes mellitus   . 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 Arthritis   . CHF (congestive heart failure) ([place])   . Lymphedema of leg   . Anxiety   . Pulmonary hypertension ([place])   . 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 Pancreatitis   . Depression   . Anemia   . Shingles   . PUD (peptic ulcer disease)   . GERD (gastroesophageal reflux disease)   . MRSA (methicillin resistant staph aureus) culture positive     Past Surgical History  Procedure Laterality Date  . Breast surgery    . Cholecystectomy    . Abdominal hysterectomy    . Replacement total knee bilateral    . Hand tendon surgery      Prior to Admission medications   Medication Sig Start Date End Date Taking? Authorizing Provider  aspirin EC 81 MG tablet Take 81 mg by mouth daily.   Yes Historical Provider, MD  bisacodyl (DULCOLAX) 5 MG EC tablet Take 5 mg by mouth daily as needed for moderate constipation.   Yes Historical Provider, MD  bumetanide (BUMEX) 1 MG tablet Take 1 mg by mouth daily.    Yes Historical Provider, MD  calcitRIOL (ROCALTROL) 0.25 MCG capsule Take 0.25 mcg by mouth every other day.   Yes Historical Provider, MD  cetirizine (ZYRTEC) 10 MG tablet Take 5 mg by mouth daily as needed for allergies.   Yes Historical Provider, MD   diphenhydramine-acetaminophen (TYLENOL PM) 25-500 MG TABS tablet Take 1 tablet by mouth at bedtime as needed (sleep).   Yes Historical Provider, MD  esomeprazole (NEXIUM) 40 MG capsule Take 40 mg by mouth daily before breakfast.     Yes Historical Provider, MD  fluticasone (FLONASE) 50 MCG/ACT nasal spray Place 1 spray into both nostrils daily as needed for allergies.  03/24/14  Yes Historical Provider, MD  hydrocortisone cream 1 % Apply 1 application topically daily as needed for itching.   Yes Historical Provider, MD  LYRICA 75 MG capsule Take 75 mg by mouth 3 (three) times daily. 12/14/13  Yes Historical Provider, MD  magnesium oxide (MAG-OX) 400 MG tablet Take 400 mg by mouth daily.   Yes Historical Provider, MD  Melatonin 3 MG CAPS Take 3 mg by mouth at bedtime.   Yes Historical Provider, MD  metoprolol (TOPROL-XL) 200 MG 24 hr tablet Take 200 mg by mouth daily. Take along with 50 mg tablet to equal 200 mg.   Yes Historical Provider, MD  metoprolol succinate (TOPROL-XL) 50 MG 24 hr tablet Take 50 mg by mouth daily. Take along with 200 mg tablet to equal 250 mg daily.   Yes Historical Provider, MD  montelukast (SINGULAIR) 10 MG tablet Take 10 mg by mouth at bedtime.     Yes Historical Provider, MD  PROAIR HFA 108 (90 BASE) MCG/ACT inhaler Take 2 puffs by mouth every 6 (six) hours as needed. 04/11/14  Yes Historical Provider, MD  sertraline (ZOLOFT) 50 MG tablet Take 100 mg by mouth at bedtime.   Yes Historical Provider, MD  citalopram (CELEXA) 10 MG tablet Take 1 tablet by mouth daily. 05/01/14   Historical Provider, MD  clonazePAM (KLONOPIN) 0.5 MG tablet Take 1 tablet by mouth twice daily x 30 days. (use for Klonopin) Patient taking differently: Take 0.5 mg by mouth 2 (two) times daily.  10/08/12   [name], DO  docusate sodium (COLACE) 100 MG capsule Take 100 mg by mouth 2 (two) times daily.    Historical Provider, MD  exemestane (AROMASIN) 25 MG tablet Take 25 mg by mouth daily.       Historical Provider, MD  metoprolol succinate (TOPROL-XL) 100 MG 24 hr tablet Take 1 tablet (100 mg total) by mouth daily. Take with or immediately following a meal. 08/27/14   [name], MD  Multiple Vitamins-Minerals (MULTIVITAMINS THER. W/MINERALS) TABS Take 1 tablet by mouth daily.      Historical Provider, MD  oxyCODONE-acetaminophen (PERCOCET) 10-325 MG per tablet Take 1 tablet by mouth every 6 (six) hours as needed for pain. Patient taking differently: Take 1 tablet by mouth every 4 (four) hours as needed for pain.  11/03/13   Carmin Muskrat, MD  polyethylene glycol [facility] Floria Raveling) packet Take 17 g by mouth daily as needed (constipation).     Historical Provider, MD  sodium bicarbonate 650 MG tablet Take 1 tablet (650 mg total) by mouth 2 (two) times daily. 08/27/14   [name], MD    Current Facility-Administered Medications  Medication Dose Route Frequency Provider Last Rate Last Dose  . 0.9 %  sodium chloride infusion   Intravenous Once Theressa Millard, MD      . etomidate (AMIDATE) injection 17.02 mg  0.3 mg/kg Intravenous Once Dorie Rank, MD      . pantoprazole (PROTONIX) 80 mg in sodium chloride 0.9 % 100 mL IVPB  80 mg Intravenous Once Theressa Millard, MD      . pantoprazole (PROTONIX) 80 mg in sodium chloride 0.9 % 250 mL (0.32 mg/mL) infusion  8 mg/hr Intravenous Continuous Theressa Millard, MD      . Derrill Memo ON 12/07/2014] pantoprazole (PROTONIX) injection 40 mg  40 mg Intravenous Q12H Harvette Evonnie Dawes, MD      . phenylephrine (NEO-SYNEPHRINE) 10 mg in dextrose 5 % 250 mL (0.04 mg/mL) infusion  0-400 mcg/min Intravenous Titrated Theressa Millard, MD      . rocuronium [facility]) injection 56.7 mg  1 mg/kg Intravenous Once Dorie Rank, MD      . sodium bicarbonate 150 mEq in dextrose 5 % 1,000 mL infusion   Intravenous Continuous Theressa Millard, MD       Current Outpatient Prescriptions  Medication Sig Dispense Refill  . aspirin EC 81 MG tablet Take  81 mg by mouth daily.    . bisacodyl (DULCOLAX) 5 MG EC tablet Take 5 mg by mouth daily as needed for moderate constipation.    . bumetanide (BUMEX) 1 MG tablet Take 1 mg by mouth daily.     . calcitRIOL (ROCALTROL) 0.25 MCG capsule Take 0.25 mcg by mouth every other day.    . cetirizine (ZYRTEC) 10 MG tablet Take 5 mg by mouth daily as needed for allergies.    . diphenhydramine-acetaminophen (TYLENOL PM) 25-500 MG TABS  tablet Take 1 tablet by mouth at bedtime as needed (sleep).    Marland Kitchen esomeprazole (NEXIUM) 40 MG capsule Take 40 mg by mouth daily before breakfast.      . fluticasone (FLONASE) 50 MCG/ACT nasal spray Place 1 spray into both nostrils daily as needed for allergies.     . hydrocortisone cream 1 % Apply 1 application topically daily as needed for itching.    Marland Kitchen LYRICA 75 MG capsule Take 75 mg by mouth 3 (three) times daily.    . magnesium oxide (MAG-OX) 400 MG tablet Take 400 mg by mouth daily.    . Melatonin 3 MG CAPS Take 3 mg by mouth at bedtime.    . metoprolol (TOPROL-XL) 200 MG 24 hr tablet Take 200 mg by mouth daily. Take along with 50 mg tablet to equal 200 mg.    . metoprolol succinate (TOPROL-XL) 50 MG 24 hr tablet Take 50 mg by mouth daily. Take along with 200 mg tablet to equal 250 mg daily.    . montelukast (SINGULAIR) 10 MG tablet Take 10 mg by mouth at bedtime.      Marland Kitchen PROAIR HFA 108 (90 BASE) MCG/ACT inhaler Take 2 puffs by mouth every 6 (six) hours as needed.    . sertraline (ZOLOFT) 50 MG tablet Take 100 mg by mouth at bedtime.    . citalopram (CELEXA) 10 MG tablet Take 1 tablet by mouth daily.    . clonazePAM (KLONOPIN) 0.5 MG tablet Take 1 tablet by mouth twice daily x 30 days. (use for Klonopin) (Patient taking differently: Take 0.5 mg by mouth 2 (two) times daily. ) 30 tablet 0  . docusate sodium (COLACE) 100 MG capsule Take 100 mg by mouth 2 (two) times daily.    Marland Kitchen exemestane (AROMASIN) 25 MG tablet Take 25 mg by mouth daily.      . metoprolol succinate (TOPROL-XL)  100 MG 24 hr tablet Take 1 tablet (100 mg total) by mouth daily. Take with or immediately following a meal. 30 tablet 0  . Multiple Vitamins-Minerals (MULTIVITAMINS THER. W/MINERALS) TABS Take 1 tablet by mouth daily.      Marland Kitchen oxyCODONE-acetaminophen (PERCOCET) 10-325 MG per tablet Take 1 tablet by mouth every 6 (six) hours as needed for pain. (Patient taking differently: Take 1 tablet by mouth every 4 (four) hours as needed for pain. ) 20 tablet 0  . polyethylene glycol (MIRALAX / GLYCOLAX) packet Take 17 g by mouth daily as needed (constipation).     . sodium bicarbonate 650 MG tablet Take 1 tablet (650 mg total) by mouth 2 (two) times daily.      Allergies as of 12/03/2014 - Review Complete 12/03/2014  Allergen Reaction Noted  . Mometasone Shortness Of Breath 01/11/2011  . Vancomycin Itching 09/25/2012  . Aspirin Other (See Comments) 12/04/2010  . Contrast media [iodinated diagnostic agents] Other (See Comments) 12/04/2010  . Cortisone Other (See Comments) 01/11/2011  . Doxycycline Nausea And Vomiting 11/03/2013  . Ibuprofen Nausea And Vomiting 11/03/2013  . Insulins Other (See Comments) 09/09/2012  . Lasix [furosemide] Other (See Comments) 06/23/2011  . Other  12/04/2010  . Prednisone  01/11/2011  . Sulfamethoxazole Other (See Comments) 01/11/2011  . Tape  05/04/2014  . Ultram [tramadol hcl] Other (See Comments) 12/04/2010  . Codeine Rash 01/11/2011  . Dilantin [phenytoin sodium] Rash 01/11/2011  . Latex Rash 08/18/2011  . Piperacillin sod-tazobactam so Rash 08/21/2014    Family History  Problem Relation Age of Onset  . Cancer Paternal Grandfather   .  Diabetes Sister   . Diabetes Sister   . Heart disease Maternal Grandmother   . Heart disease Father   . Hyperlipidemia     Social History   Social History  . Marital Status: Divorced    Spouse Name: N/A  . Number of Children: N/A  . Years of Education: N/A   Occupational History  . Not on file.   Social History Main  Topics  . Smoking status: Never Smoker   . Smokeless tobacco: Never Used  . Alcohol Use: No  . Drug Use: No  . Sexual Activity: Not on file   Other Topics Concern  . Not on file   Social History Narrative    Review of Systems: PER HPI OTHERWISE ALL SYSTEMS ARE NEGATIVE.   Vitals: Blood pressure 67/22, pulse 128, temperature 95.4 F (35.2 C), temperature source Core (Comment), resp. rate 43, weight 125 lb (56.7 kg), SpO2 100 %.  Physical Exam: General:   UNRESPONSIVE in NAD Head:  Normocephalic and atraumatic. Eyes:  Sclera clear, no icterus.   Conjunctiva pink. Mouth:  ETT IN PLACE Neck:  Supple; no masses. Lungs:  Clear throughout to auscultation.   No wheezes. No acute distress. Heart:  Regular rate and rhythm; no murmurs. Abdomen:  Soft, nontender and nondistended. No masses, hepatosplenomegaly or hernias noted. HYPOACTIVE bowel sounds, without guarding, and without rebound.   Msk:  Symmetrical with gross deformities. Normal posture. Extremities:  Without edema. Neurologic: COMATOSE Cervical Nodes:  No significant cervical adenopathy. Psych:  Alert and cooperative. Normal mood and affect.   Lab Results:  Recent Labs  12/03/14 1100  WBC 18.6*  HGB 13.7  HCT 40.5  PLT 205   BMET  Recent Labs  12/03/14 1100  NA 147*  K 5.2*  CL 124*  CO2 5*  GLUCOSE 115*  BUN 66*  CREATININE 2.47*  CALCIUM 10.6*   LFT  Recent Labs  12/03/14 1100  PROT 8.3*  ALBUMIN 4.5  AST 40  ALT 20  ALKPHOS 107  BILITOT 0.8     Studies/Results: Dec 03 2014: CT HEAD: NAIAP, pCXR-NACPD   LOS: 0 days   Brance Dartt  12/03/2014, 8:33 PM

## 2014-12-03 NOTE — Progress Notes (Addendum)
ANTIBIOTIC CONSULT NOTE-Preliminary  Pharmacy Consult for zosyn Indication: sepsis  Allergies  Allergen Reactions  . Mometasone Shortness Of Breath  . Vancomycin Itching  . Aspirin Other (See Comments)    "Inflames stomach"  . Contrast Media [Iodinated Diagnostic Agents] Other (See Comments)    Made Heart Stop.   . Cortisone Other (See Comments)    Hold fluid  . Doxycycline Nausea And Vomiting  . Ibuprofen Nausea And Vomiting  . Insulins Other (See Comments)    Pt says even the tiniest bit of insulin makes her go unconscious because her BS gets too low  . Lasix [Furosemide] Other (See Comments)    Paradoxical Response  . Other     Iv bp med unknown.and adhesive tape-silicones  . Prednisone     Sweating   . Sulfamethoxazole Other (See Comments)    Bottomed out platelets  . Tape   . Ultram [Tramadol Hcl] Other (See Comments)    "Grand mal seizure"  . Codeine Rash  . Dilantin [Phenytoin Sodium] Rash  . Latex Rash  . Piperacillin Sod-Tazobactam So Rash    Patient Measurements: Height: 4' 7" (139.7 cm) Weight: 114 lb 13.8 oz (52.1 kg) IBW/kg (Calculated) : 34   Vital Signs: Temp: 99.7 F (37.6 C) (11/27 2251) Temp Source: Core (Comment) (11/27 2249) BP: 132/49 mmHg (11/27 2245) Pulse Rate: 147 (11/27 2250)  Labs:  Recent Labs  12/03/14 1100  WBC 18.6*  HGB 13.7  PLT 205  CREATININE 2.47*    Estimated Creatinine Clearance: 14.2 mL/min (by C-G formula based on Cr of 2.47).  No results for input(s): VANCOTROUGH, VANCOPEAK, VANCORANDOM, GENTTROUGH, GENTPEAK, GENTRANDOM, TOBRATROUGH, TOBRAPEAK, TOBRARND, AMIKACINPEAK, AMIKACINTROU, AMIKACIN in the last 72 hours.   Microbiology: Recent Results (from the past 720 hour(s))  Gram stain     Status: None   Collection Time: 12/03/14  4:29 PM  Result Value Ref Range Status   Specimen Description BACK  Final   Special Requests Immunocompromised  Final   Gram Stain NO ORGANISMS SEEN NO WBC SEEN   Final   Report  Status 12/03/2014 FINAL  Final    Medical History: Past Medical History  Diagnosis Date  . Kidney disease   . Hypertension   . Diabetes mellitus   . 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 Arthritis   . CHF (congestive heart failure) ([place])   . Lymphedema of leg   . Anxiety   . Pulmonary hypertension ([place])   . 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 Pancreatitis   . Depression   . Anemia   . Shingles   . PUD (peptic ulcer disease)   . GERD (gastroesophageal reflux disease)   . MRSA (methicillin resistant staph aureus) culture positive     Medications:  Scheduled:  . sodium chloride   Intravenous Once  . EPINEPHrine      . [START ON 12/07/2014] pantoprazole (PROTONIX) IV  40 mg Intravenous Q12H  . piperacillin-tazobactam (ZOSYN)  IV  3.375 g Intravenous Q8H    Assessment: 69 yo female found down in home unresponsive. Being treated for sepsis. On aztreonam and levaquin.   Goal of Therapy:  Eradication of infection  Plan:  Preliminary review of pertinent patient information completed.  Protocol will be initiated with a one-time dose(s) of zosyn 3.375 grams x 2 doses.  Forestine Na clinical pharmacist will complete review during morning rounds to assess patient and finalize treatment regimen.  [id number],  Catherine Mccullough, [facility] 12/03/2014,11:03 PM

## 2014-12-03 NOTE — ED Notes (Signed)
Dr Caryn Section in room with patient  For endoscopy. meds given to  Endo nurses

## 2014-12-03 NOTE — H&P (Signed)
[facility] History and Physical  Catherine Mccullough:[id number] DOB: October 07, 1945    PCP:   Default, Provider, MD   Chief Complaint: found unresponsive, down for three days.   HPI: Catherine Mccullough is an 69 y.o. female with hx of CKD, DM, breast cancer with bone mets, hx of diastolic CHF., found by her son at home after three days, unclear how long she was down.  She was brought to the ER hypothermic at 66F, and BP 125, unresponsive, but was able to maintain her breathing and airways.  In the ER, she was given IV Keppra, bear hugger and warm saline.  EKG showed NSR with U waves, Cr 2.5, with Bicarb of 5, Na of 147 and Chloride of 125.  Her UA was negative for infection.  A femoral line was place on the right groin with triple lumens.  As she was warming up, her BP expectedly dropped and she was given more IVF.  She was started on Keppra for her seizure.  She subsequently had a spinal tap, and was started on IV Zyvox and Aztreonam and Levoquin.  Hospitalist was asked to admit her for further Tx.  EDP did not feel that she was a candidate for PCCM transfer.  I spoke with Dr Janann Colonel of PCCM, he recommended to check Ethylene Glycol, ABG, and an osmolar gap.  He recommended IVF and pressor if required.  He felt that unless there is Ethylene Glycol present, he doesn't feel that she needs PCCM.    Rewiew of Systems: Unable.   Past Medical History  Diagnosis Date  . Kidney disease   . Hypertension   . Diabetes mellitus   . Cancer ([place])     brest met to bone  . Arthritis   . CHF (congestive heart failure) ([place])   . Lymphedema of leg   . Anxiety   . Pulmonary hypertension ([place])   . 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 Pancreatitis   . Depression   . Anemia   . Shingles   . PUD (peptic ulcer disease)   . GERD (gastroesophageal reflux disease)   . MRSA (methicillin resistant staph aureus) culture positive     Past Surgical History  Procedure Laterality Date  .  Breast surgery    . Cholecystectomy    . Abdominal hysterectomy    . Replacement total knee bilateral    . Hand tendon surgery      Medications:  HOME MEDS: Prior to Admission medications   Medication Sig Start Date End Date Taking? Authorizing Provider  aspirin EC 81 MG tablet Take 81 mg by mouth daily.   Yes Historical Provider, MD  bisacodyl (DULCOLAX) 5 MG EC tablet Take 5 mg by mouth daily as needed for moderate constipation.   Yes Historical Provider, MD  bumetanide (BUMEX) 1 MG tablet Take 1 mg by mouth daily.    Yes Historical Provider, MD  calcitRIOL (ROCALTROL) 0.25 MCG capsule Take 0.25 mcg by mouth every other day.   Yes Historical Provider, MD  cetirizine (ZYRTEC) 10 MG tablet Take 5 mg by mouth daily as needed for allergies.   Yes Historical Provider, MD  diphenhydramine-acetaminophen (TYLENOL PM) 25-500 MG TABS tablet Take 1 tablet by mouth at bedtime as needed (sleep).   Yes Historical Provider, MD  esomeprazole (NEXIUM) 40 MG capsule Take 40 mg by mouth daily before breakfast.     Yes Historical Provider, MD  fluticasone (FLONASE) 50 MCG/ACT nasal spray  Place 1 spray into both nostrils daily as needed for allergies.  03/24/14  Yes Historical Provider, MD  hydrocortisone cream 1 % Apply 1 application topically daily as needed for itching.   Yes Historical Provider, MD  LYRICA 75 MG capsule Take 75 mg by mouth 3 (three) times daily. 12/14/13  Yes Historical Provider, MD  magnesium oxide (MAG-OX) 400 MG tablet Take 400 mg by mouth daily.   Yes Historical Provider, MD  Melatonin 3 MG CAPS Take 3 mg by mouth at bedtime.   Yes Historical Provider, MD  metoprolol (TOPROL-XL) 200 MG 24 hr tablet Take 200 mg by mouth daily. Take along with 50 mg tablet to equal 200 mg.   Yes Historical Provider, MD  metoprolol succinate (TOPROL-XL) 50 MG 24 hr tablet Take 50 mg by mouth daily. Take along with 200 mg tablet to equal 250 mg daily.   Yes Historical Provider, MD  montelukast (SINGULAIR)  10 MG tablet Take 10 mg by mouth at bedtime.     Yes Historical Provider, MD  PROAIR HFA 108 (90 BASE) MCG/ACT inhaler Take 2 puffs by mouth every 6 (six) hours as needed. 04/11/14  Yes Historical Provider, MD  sertraline (ZOLOFT) 50 MG tablet Take 100 mg by mouth at bedtime.   Yes Historical Provider, MD  citalopram (CELEXA) 10 MG tablet Take 1 tablet by mouth daily. 05/01/14   Historical Provider, MD  clonazePAM (KLONOPIN) 0.5 MG tablet Take 1 tablet by mouth twice daily x 30 days. (use for Klonopin) Patient taking differently: Take 0.5 mg by mouth 2 (two) times daily.  10/08/12   [name], DO  docusate sodium (COLACE) 100 MG capsule Take 100 mg by mouth 2 (two) times daily.    Historical Provider, MD  exemestane (AROMASIN) 25 MG tablet Take 25 mg by mouth daily.      Historical Provider, MD  metoprolol succinate (TOPROL-XL) 100 MG 24 hr tablet Take 1 tablet (100 mg total) by mouth daily. Take with or immediately following a meal. 08/27/14   [name], MD  Multiple Vitamins-Minerals (MULTIVITAMINS THER. W/MINERALS) TABS Take 1 tablet by mouth daily.      Historical Provider, MD  oxyCODONE-acetaminophen (PERCOCET) 10-325 MG per tablet Take 1 tablet by mouth every 6 (six) hours as needed for pain. Patient taking differently: Take 1 tablet by mouth every 4 (four) hours as needed for pain.  11/03/13   Carmin Muskrat, MD  polyethylene glycol [facility] Floria Raveling) packet Take 17 g by mouth daily as needed (constipation).     Historical Provider, MD  sodium bicarbonate 650 MG tablet Take 1 tablet (650 mg total) by mouth 2 (two) times daily. 08/27/14   [name], MD     Allergies:  Allergies  Allergen Reactions  . Mometasone Shortness Of Breath  . Vancomycin Itching  . Aspirin Other (See Comments)    "Inflames stomach"  . Contrast Media [Iodinated Diagnostic Agents] Other (See Comments)    Made Heart Stop.   . Cortisone Other (See Comments)    Hold fluid  . Doxycycline Nausea  And Vomiting  . Ibuprofen Nausea And Vomiting  . Insulins Other (See Comments)    Pt says even the tiniest bit of insulin makes her go unconscious because her BS gets too low  . Lasix [Furosemide] Other (See Comments)    Paradoxical Response  . Other     Iv bp med unknown.and adhesive tape-silicones  . Prednisone     Sweating   .  Sulfamethoxazole Other (See Comments)    Bottomed out platelets  . Tape   . Ultram [Tramadol Hcl] Other (See Comments)    "Grand mal seizure"  . Codeine Rash  . Dilantin [Phenytoin Sodium] Rash  . Latex Rash  . Piperacillin Sod-Tazobactam So Rash    Social History:   reports that she has never smoked. She has never used smokeless tobacco. She reports that she does not drink alcohol or use illicit drugs.  Family History: Family History  Problem Relation Age of Onset  . Cancer Paternal Grandfather   . Diabetes Sister   . Diabetes Sister   . Heart disease Maternal Grandmother   . Heart disease Father   . Hyperlipidemia       Physical Exam: Filed Vitals:   12/03/14 1638 12/03/14 1732 12/03/14 1807 12/03/14 1822  BP: 1_0   Pulse: 92 101 96   Temp: 90 F (32.2 C) 90.5 F (32.5 C)  92 F (33.3 C)  TempSrc: Core (Comment) Core (Comment)  Core (Comment)  Resp: _1 Weight:      SpO2: 100% 97% 100%    Blood pressure 71/40, pulse 96, temperature 92 F (33.3 C), temperature source Core (Comment), resp. rate 30, weight 56.7 kg (125 lb), SpO2 100 %.  GEN:  Unresponsive.  PSYCH:  Unable.  HEENT: Mucous membranes pink and anicteric; PERRLA; EOM intact; no cervical lymphadenopathy nor thyromegaly or carotid bruit; no JVD; There were no stridor. Neck is very supple. Breasts:: Not examined CHEST WALL: No tenderness CHEST: Deep breathing, but not in resp distress.  No rales, no wheezing.  HEART: Regular rate and rhythm.  There are no murmur, rub, or gallops.   BACK: No kyphosis or scoliosis; no CVA tenderness ABDOMEN: soft and  non-tender; no masses, no organomegaly, normal abdominal bowel sounds; no pannus; no intertriginous candida. There is no rebound and no distention. Rectal Exam: Not done EXTREMITIES: No bone or joint deformity; age-appropriate arthropathy of the hands and knees; no edema; no ulcerations.  There is no calf tenderness. Genitalia: not examined PULSES: 2+ and symmetric SKIN: Normal hydration no rash or ulceration CNS: Doesn't    Labs on Admission:  Basic Metabolic Panel:  Recent Labs Lab 12/03/14 1100  NA 147*  K 5.2*  CL 124*  CO2 5*  GLUCOSE 115*  BUN 66*  CREATININE 2.47*  CALCIUM 10.6*   Liver Function Tests:  Recent Labs Lab 12/03/14 1100  AST 40  ALT 20  ALKPHOS 107  BILITOT 0.8  PROT 8.3*  ALBUMIN 4.5    Recent Labs Lab 12/03/14 1100  AMMONIA 40*   CBC:  Recent Labs Lab 12/03/14 1100  WBC 18.6*  NEUTROABS 17.0*  HGB 13.7  HCT 40.5  MCV 104.1*  PLT 205   Cardiac Enzymes:  Recent Labs Lab 12/03/14 1200  CKTOTAL 356*    CBG:  Recent Labs Lab 12/03/14 1042  GLUCAP 97    Radiological Exams on Admission: Ct Head Wo Contrast  12/03/2014  CLINICAL DATA:  69 year old female found down by family. Uncertain how long patient has been down (last contact with family members was 3 days ago). EXAM: CT HEAD WITHOUT CONTRAST TECHNIQUE: Contiguous axial images were obtained from the base of the skull through the vertex without intravenous contrast. COMPARISON:  Head CT 04/26/2014. FINDINGS: Low-attenuation in the inferior aspect of the right frontal lobe, mild low-attenuation in the inferior aspect of the medial left frontal lobe, and low-attenuation in the anterior aspect  of the right temporal lobe, all similar to the prior examination, likely to reflect gliosis from remote trauma. Physiologic calcifications in the basal ganglia bilaterally. No acute intracranial abnormalities. Specifically, no evidence of acute intracranial hemorrhage, no definite findings of  acute/subacute cerebral ischemia, no mass, mass effect, hydrocephalus or abnormal intra or extra-axial fluid collections. Visualized paranasal sinuses and mastoids are well pneumatized. No acute displaced skull fractures are identified. IMPRESSION: 1. No acute intracranial abnormalities. 2. Areas of probable gliosis in the frontal lobes bilaterally (right greater than left) and anterior aspect of the right temporal lobe, unchanged compared to the prior study, likely related to remote trauma. Electronically Signed   By: Vinnie Langton M.D.   On: 12/03/2014 13:07   Dg Chest Port 1 View  12/03/2014  CLINICAL DATA:  Follow-up right subclavian central line attempt EXAM: PORTABLE CHEST 1 VIEW COMPARISON:  Prior film same day FINDINGS: Cardiomediastinal silhouette is stable. Persistent streaky right basilar atelectasis or infiltrate. There is no pneumothorax. Degenerative changes right shoulder. No pulmonary edema. Moderate gaseous distension of the stomach. IMPRESSION: Persistent streaky right basilar atelectasis or infiltrate. No pneumothorax. Degenerative changes right shoulder. Electronically Signed   By: Lahoma Crocker M.D.   On: 12/03/2014 15:58   Dg Chest Port 1 View  12/03/2014  CLINICAL DATA:  Found unresponsive on the floor, history of breast cancer, hyponatremia EXAM: PORTABLE CHEST 1 VIEW COMPARISON:  08/21/2014 FINDINGS: Cardiomediastinal silhouette is unremarkable. There is streaky right basilar atelectasis or infiltrate. Moderate gaseous distension of the stomach. No pulmonary edema. IMPRESSION: Streaky right basilar atelectasis or infiltrate. Aspiration cannot be excluded. Moderate gastric distension of the stomach. No pulmonary edema. Electronically Signed   By: Lahoma Crocker M.D.   On: 12/03/2014 13:34    EKG: Independently reviewed. NSR with U waves.    Assessment/Plan Present on Admission:  . Altered mental status . Breast cancer metastasized to bone ([place]) . CKD (chronic kidney disease)  stage 4, GFR 15-29 ml/min (HCC) . Dehydration  PLAN:  Unresponsiveness:  Not clear what was the precipitating factor.  I don't think this is a case of EG poisoning, and her metabolic acidosis is likely a combination of hyperchloremic metabolic acidosis, with chronic kidney disease.  We will repeat her electrolytes, and will need to give bicarb in her fluid.  Will check ABG.  She will need a large amount of IVF, and if unable, will start pressor.  She has a central line now.  Her LP is essentially negative.   Will continue with Zyvox and Levoquin at this time.  I offered to call to different hospitals for ICU transfer, but her son declined.  I told her son who is her POA/HCP, that she is CRITICALLY ILL, and that she may not wake up.  He expressed understand and appreciation.  Will admit her to our ICU, warm her up, continue IVF, continue antibiotics, and correct her acid base imbalance.  Thank you and Good Day.   Other plans as per orders.  Code Status: FULL Haskel Khan, MD. [facility] Pager [phone] 7pm to 7am.  12/03/2014, 6:22 PM

## 2014-12-03 NOTE — ED Provider Notes (Signed)
Pt was seen by Dr Peyton Bottoms earlier.  Pt is being admitted to the hospital.  I was asked to intubate the patient for altered mental status, shock, airway protection. Physical Exam  BP 67/22 mmHg  Pulse 128  Temp(Src) 95.4 F (35.2 C) (Core (Comment))  Resp 43  Wt 56.7 kg  SpO2 100%  Physical Exam  Constitutional:  Obtunded   HENT:  Head: Normocephalic and atraumatic.  Right Ear: External ear normal.  Left Ear: External ear normal.  Absent teeth, able to see the uvula  Eyes: Right eye exhibits no discharge. Left eye exhibits no discharge. No scleral icterus.  Neck: Neck supple. No tracheal deviation present.  Cardiovascular: Tachycardia present.   Pulmonary/Chest: Effort normal. No stridor. No respiratory distress.  Skin: No rash noted.  Nursing note and vitals reviewed.   ED Course  .Intubation Date/Time: 12/03/2014 8:49 PM Performed by: Dorie Rank Authorized by: Dorie Rank Consent: The procedure was performed in an emergent situation. Indications: airway protection Intubation method: video-assisted Patient status: paralyzed (RSI) Preoxygenation: BVM Sedatives: etomidate Paralytic: rocuronium Tube size: 7.5 mm Tube type: cuffed Number of attempts: 1 Cords visualized: yes Post-procedure assessment: ETCO2 monitor Breath sounds: equal Cuff inflated: yes Tube secured with: ETT holder Chest x-ray interpreted by radiologist. Comments: Pt tolerated well.    MDM Pt intubated successfully.  CXR pending.  Sedation meds ordered.      Dorie Rank, MD 12/03/14 2053

## 2014-12-03 NOTE — ED Provider Notes (Signed)
CSN: [id number]     Arrival date & time 12/03/14  1017 History  By signing my name below, I, Stephania Fragmin, attest that this documentation has been prepared under the direction and in the presence of Daleen Bo, MD. Electronically Signed: Stephania Fragmin, ED Scribe. 12/03/2014. 4:51 PM.    Chief Complaint  Patient presents with  . Altered Mental Status   The history is provided by the patient. No language interpreter was used.   LEVEL 5 CAVEAT DUE TO ALTERED MENTAL STATUS  HPI Comments: Catherine Mccullough is a 69 y.o. female who presents to the Emergency Department brought in by ambulance for AMS. Per EMS, her family found her on the floor by her bed, and they are unsure how long she had been lying there, as no one had checked on her since 3 days ago. Her family states that she is usually talking, but she has been nonverbal since they found her today. EMS adds that she felt cool to the touch, so they had covered her with blankets on her en route.   Past Medical History  Diagnosis Date  . Kidney disease   . Hypertension   . Diabetes mellitus   . 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 Arthritis   . CHF (congestive heart failure) ([place])   . Lymphedema of leg   . Anxiety   . Pulmonary hypertension ([place])   . 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 Pancreatitis   . Depression   . Anemia   . Shingles   . PUD (peptic ulcer disease)   . GERD (gastroesophageal reflux disease)   . MRSA (methicillin resistant staph aureus) culture positive    Past Surgical History  Procedure Laterality Date  . Breast surgery    . Cholecystectomy    . Abdominal hysterectomy    . Replacement total knee bilateral    . Hand tendon surgery     Family History  Problem Relation Age of Onset  . Cancer Paternal Grandfather   . Diabetes Sister   . Diabetes Sister   . Heart disease Maternal Grandmother   . Heart disease Father   . Hyperlipidemia     Social History  Substance Use Topics  .  Smoking status: Never Smoker   . Smokeless tobacco: Never Used  . Alcohol Use: No   OB History    No data available     Review of Systems  Unable to perform ROS: Mental status change   Allergies  Mometasone; Vancomycin; Aspirin; Contrast media; Cortisone; Doxycycline; Ibuprofen; Insulins; Lasix; Other; Prednisone; Sulfamethoxazole; Tape; Ultram; Codeine; Dilantin; Latex; and Piperacillin sod-tazobactam so  Home Medications   Prior to Admission medications   Medication Sig Start Date End Date Taking? Authorizing Provider  aspirin EC 81 MG tablet Take 81 mg by mouth daily.   Yes Historical Provider, MD  bisacodyl (DULCOLAX) 5 MG EC tablet Take 5 mg by mouth daily as needed for moderate constipation.   Yes Historical Provider, MD  bumetanide (BUMEX) 1 MG tablet Take 1 mg by mouth daily.    Yes Historical Provider, MD  calcitRIOL (ROCALTROL) 0.25 MCG capsule Take 0.25 mcg by mouth every other day.   Yes Historical Provider, MD  cetirizine (ZYRTEC) 10 MG tablet Take 5 mg by mouth daily as needed for allergies.   Yes Historical Provider, MD  diphenhydramine-acetaminophen (TYLENOL PM) 25-500 MG TABS tablet Take 1 tablet by mouth at bedtime as  needed (sleep).   Yes Historical Provider, MD  esomeprazole (NEXIUM) 40 MG capsule Take 40 mg by mouth daily before breakfast.     Yes Historical Provider, MD  fluticasone (FLONASE) 50 MCG/ACT nasal spray Place 1 spray into both nostrils daily as needed for allergies.  03/24/14  Yes Historical Provider, MD  hydrocortisone cream 1 % Apply 1 application topically daily as needed for itching.   Yes Historical Provider, MD  LYRICA 75 MG capsule Take 75 mg by mouth 3 (three) times daily. 12/14/13  Yes Historical Provider, MD  magnesium oxide (MAG-OX) 400 MG tablet Take 400 mg by mouth daily.   Yes Historical Provider, MD  Melatonin 3 MG CAPS Take 3 mg by mouth at bedtime.   Yes Historical Provider, MD  metoprolol (TOPROL-XL) 200 MG 24 hr tablet Take 200 mg by  mouth daily. Take along with 50 mg tablet to equal 200 mg.   Yes Historical Provider, MD  metoprolol succinate (TOPROL-XL) 50 MG 24 hr tablet Take 50 mg by mouth daily. Take along with 200 mg tablet to equal 250 mg daily.   Yes Historical Provider, MD  montelukast (SINGULAIR) 10 MG tablet Take 10 mg by mouth at bedtime.     Yes Historical Provider, MD  PROAIR HFA 108 (90 BASE) MCG/ACT inhaler Take 2 puffs by mouth every 6 (six) hours as needed. 04/11/14  Yes Historical Provider, MD  sertraline (ZOLOFT) 50 MG tablet Take 100 mg by mouth at bedtime.   Yes Historical Provider, MD  citalopram (CELEXA) 10 MG tablet Take 1 tablet by mouth daily. 05/01/14   Historical Provider, MD  clonazePAM (KLONOPIN) 0.5 MG tablet Take 1 tablet by mouth twice daily x 30 days. (use for Klonopin) Patient taking differently: Take 0.5 mg by mouth 2 (two) times daily.  10/08/12   [name], DO  docusate sodium (COLACE) 100 MG capsule Take 100 mg by mouth 2 (two) times daily.    Historical Provider, MD  exemestane (AROMASIN) 25 MG tablet Take 25 mg by mouth daily.      Historical Provider, MD  metoprolol succinate (TOPROL-XL) 100 MG 24 hr tablet Take 1 tablet (100 mg total) by mouth daily. Take with or immediately following a meal. 08/27/14   [name], MD  Multiple Vitamins-Minerals (MULTIVITAMINS THER. W/MINERALS) TABS Take 1 tablet by mouth daily.      Historical Provider, MD  oxyCODONE-acetaminophen (PERCOCET) 10-325 MG per tablet Take 1 tablet by mouth every 6 (six) hours as needed for pain. Patient taking differently: Take 1 tablet by mouth every 4 (four) hours as needed for pain.  11/03/13   Carmin Muskrat, MD  polyethylene glycol [facility] / Floria Raveling) packet Take 17 g by mouth daily as needed (constipation).     Historical Provider, MD  sodium bicarbonate 650 MG tablet Take 1 tablet (650 mg total) by mouth 2 (two) times daily. 08/27/14   [name], MD   BP 122/50 mmHg  Pulse 92  Temp(Src) 90 F (32.2  C) (Core (Comment))  Resp 28  Wt 125 lb (56.7 kg)  SpO2 100% Physical Exam  Constitutional: She appears well-developed.  Under nourished, appears older than stated age, disheveled  HENT:  Head: Normocephalic and atraumatic.  Poor dentition  Eyes: Conjunctivae and EOM are normal.  Meiotic pupils bilaterally with decreased responsiveness.  Neck: Normal range of motion and phonation normal. Neck supple.  Cardiovascular: Normal rate and regular rhythm.   Pulmonary/Chest: Effort normal and breath sounds normal. She  exhibits no tenderness.  Tachypnea. Healed mastectomy scars  Abdominal: Soft. She exhibits no distension. There is no tenderness. There is no guarding.  Musculoskeletal:  No large joint deformity  Neurological: She exhibits normal muscle tone.  Obtunded. Increased tone, arms, legs, and jaw bilaterally.  Skin: Skin is dry.  Skin is cool  Psychiatric:  Unable to assess  Nursing note and vitals reviewed.   ED Course  .Central Line Date/Time: 12/03/2014 3:10 PM Performed by: Daleen Bo Authorized by: Daleen Bo Consent: The procedure was performed in an emergent situation. Verbal consent not obtained. Written consent not obtained. Required items: required blood products, implants, devices, and special equipment available Patient identity confirmed: arm band and hospital-assigned identification number Time out: Immediately prior to procedure a "time out" was called to verify the correct patient, procedure, equipment, support staff and site/side marked as required. Indications: vascular access Anesthesia: local infiltration Local anesthetic: lidocaine 1% without epinephrine Anesthetic total: 3 ml Patient sedated: no Preparation: skin prepped with 2% chlorhexidine Skin prep agent dried: skin prep agent completely dried prior to procedure Sterile barriers: all five maximum sterile barriers used - cap, mask, sterile gown, sterile gloves, and large sterile sheet Hand  hygiene: hand hygiene performed prior to central venous catheter insertion Location details: right subclavian Patient position: Trendelenburg Comments: 3 passes with aspiration needle, unable to locate blood vessel, and returned air 3. Because of suspected entry of the right pleural cavity, the procedure was abated. Urgent portal chest x-ray obtained, which did not show pneumothorax. Preparations made for second central line, in the groin.     16:35- second central line procedure done. Again, done as emergent procedure, sterile technique with 5 barrier protection. Site- right femoral vein. Triple-lumen catheter placed after were inserted into right femoral vein using Seldinger technique, dilation of the skin orifice, and placement confirmed by aspiration from all ports. Triple-lumen catheter was sutured in place, and antibiotic pad, and Tegaderm occlusive dressing applied. Patient tolerated procedure well without complication.   1645- lumbar puncture procedure. Done emergently. Sterile technique with patient in left lateral decubitus position. Position for puncture determined by clinical location of the L3-4 space. No anesthesia used. Patient is obtunded. Single pass using a 22-gauge spinal needle, to obtain clear spinal fluid, 4 ml in 4 tubes. Band-Aid placed over puncture site.        (including critical care time)  Warming blanket initiated for severe hypothermia  Medications  levofloxacin (LEVAQUIN) IVPB 750 mg (750 mg Intravenous New Bag/Given 12/03/14 1600)  LORazepam (ATIVAN) 2 MG/ML injection (not administered)  sodium chloride 0.9 % bolus 500 mL (0 mLs Intravenous Stopped 12/03/14 1115)  sodium chloride 0.9 % bolus 1,500 mL (0 mLs Intravenous Stopped 12/03/14 1536)  levETIRAcetam (KEPPRA) IVPB 1000 mg/100 mL premix (1,000 mg Intravenous Given 12/03/14 1354)  LORazepam (ATIVAN) injection 0.5 mg (0.5 mg Intravenous Given 12/03/14 1334)  aztreonam (AZACTAM) 2 g in dextrose 5 % 50  mL IVPB (0 g Intravenous Stopped 12/03/14 1450)  linezolid (ZYVOX) IVPB 600 mg (0 mg Intravenous Stopped 12/03/14 1557)    Patient Vitals for the past 24 hrs:  BP Temp Temp src Pulse Resp SpO2 Weight  12/03/14 1638 (!) 122/50 mmHg (!) 90 F (32.2 C) Core 92 (!) 28 100 % -  12/03/14 1601 119/68 mmHg (!) 90.4 F (32.4 C) Core 91 (!) 28 100 % -  12/03/14 1542 121/58 mmHg (!) 91.2 F (32.9 C) Core 91 26 99 % -  12/03/14 1503 (!) 138/49 mmHg - -  95 (!) 28 100 % -  12/03/14 1437 130/55 mmHg (!) 90.1 F (32.3 C) Core 92 (!) 28 100 % -  12/03/14 1412 (!) 136/51 mmHg (!) 91 F (32.8 C) Core 93 26 100 % -  12/03/14 1336 144/56 mmHg - - 103 (!) 28 100 % -  12/03/14 1305 128/60 mmHg (!) 90.6 F (32.6 C) Core 91 (!) 36 100 % -  12/03/14 1231 112/59 mmHg (!) 90.3 F (32.4 C) - 92 (!) 28 100 % -  12/03/14 1203 (!) 117/46 mmHg - - 92 (!) 30 100 % -  12/03/14 1130 123/59 mmHg (!) 90 F (32.2 C) Core 82 26 100 % -  12/03/14 1059 - - Core - - - -  12/03/14 1046 - (!) 90 F (32.2 C) Core - - - -  12/03/14 1044 159/71 mmHg (!) 90 F (32.2 C) Rectal 88 24 100 % -  12/03/14 1025 157/68 mmHg - - 98 24 98 % 125 lb (56.7 kg)   13:30- . Patient had right-sided shaking, for about 90 seconds, associated with drooling. She was treated with Ativan. She was loaded with Keppra.  13:40- case discussed with on-call infectious disease physician, Dr. Dereck Ligas, who agrees with broad-spectrum coverage, including Linezolid  2:48 PM Reevaluation with update and discussion. After initial assessment and treatment, an updated evaluation reveals patient sleeping. IV access is limited, now with only single left AC line. Multiple peripheral attempts by nursing, failed. Larone Kliethermes L   15:00 - I discussed the case with the patient's son, who is her power of attorney. His name is Kairy Folsom. He states that he believes she has a living will, but he does not know what states. He states that he will come here later today. His  phone number is [phone].  16:50- patient's father and her sister arrived and I informed him the findings of the case.   4:39 PM-Consult complete with Dr. Marin Comment. Patient case explained and discussed. He agrees to admit patient for further evaluation and treatment. Call ended at 17:15   CRITICAL CARE Performed by: Richarda Blade Total critical care time: 110 minutes Critical care time was exclusive of separately billable procedures and treating other patients. Critical care was necessary to treat or prevent imminent or life-threatening deterioration. Critical care was time spent personally by me on the following activities: development of treatment plan with patient and/or surrogate as well as nursing, discussions with consultants, evaluation of patient's response to treatment, examination of patient, obtaining history from patient or surrogate, ordering and performing treatments and interventions, ordering and review of laboratory studies, ordering and review of radiographic studies, pulse oximetry and re-evaluation of patient's condition.   Labs Review Labs Reviewed  AMMONIA - Abnormal; Notable for the following:    Ammonia 40 (*)    All other components within normal limits  COMPREHENSIVE METABOLIC PANEL - Abnormal; Notable for the following:    Sodium 147 (*)    Potassium 5.2 (*)    Chloride 124 (*)    CO2 5 (*)    Glucose, Bld 115 (*)    BUN 66 (*)    Creatinine, Ser 2.47 (*)    Calcium 10.6 (*)    Total Protein 8.3 (*)    GFR calc non Af Amer 19 (*)    GFR calc Af Amer 22 (*)    Anion gap 18 (*)    All other components within normal limits  CBC WITH DIFFERENTIAL/PLATELET - Abnormal; Notable for the following:  WBC 18.6 (*)    MCV 104.1 (*)    MCH 35.2 (*)    RDW 21.0 (*)    Neutro Abs 17.0 (*)    All other components within normal limits  LACTIC ACID, PLASMA - Abnormal; Notable for the following:    Lactic Acid, Venous 2.4 (*)    All other components within normal  limits  URINALYSIS, ROUTINE W REFLEX MICROSCOPIC (NOT AT [facility]) - Abnormal; Notable for the following:    Hgb urine dipstick SMALL (*)    Protein, ur 30 (*)    All other components within normal limits  ACETAMINOPHEN LEVEL - Abnormal; Notable for the following:    Acetaminophen (Tylenol), Serum <10 (*)    All other components within normal limits  URINE MICROSCOPIC-ADD ON - Abnormal; Notable for the following:    Bacteria, UA FEW (*)    All other components within normal limits  URINE CULTURE  CULTURE, BLOOD (ROUTINE X 2)  CULTURE, BLOOD (ROUTINE X 2)  ETHANOL  SALICYLATE LEVEL  URINE RAPID DRUG SCREEN, HOSP PERFORMED  CBG MONITORING, ED    Imaging Review Ct Head Wo Contrast  12/03/2014  CLINICAL DATA:  69 year old female found down by family. Uncertain how long patient has been down (last contact with family members was 3 days ago). EXAM: CT HEAD WITHOUT CONTRAST TECHNIQUE: Contiguous axial images were obtained from the base of the skull through the vertex without intravenous contrast. COMPARISON:  Head CT 04/26/2014. FINDINGS: Low-attenuation in the inferior aspect of the right frontal lobe, mild low-attenuation in the inferior aspect of the medial left frontal lobe, and low-attenuation in the anterior aspect of the right temporal lobe, all similar to the prior examination, likely to reflect gliosis from remote trauma. Physiologic calcifications in the basal ganglia bilaterally. No acute intracranial abnormalities. Specifically, no evidence of acute intracranial hemorrhage, no definite findings of acute/subacute cerebral ischemia, no mass, mass effect, hydrocephalus or abnormal intra or extra-axial fluid collections. Visualized paranasal sinuses and mastoids are well pneumatized. No acute displaced skull fractures are identified. IMPRESSION: 1. No acute intracranial abnormalities. 2. Areas of probable gliosis in the frontal lobes bilaterally (right greater than left) and anterior aspect of  the right temporal lobe, unchanged compared to the prior study, likely related to remote trauma. Electronically Signed   By: Vinnie Langton M.D.   On: 12/03/2014 13:07   Dg Chest Port 1 View  12/03/2014  CLINICAL DATA:  Follow-up right subclavian central line attempt EXAM: PORTABLE CHEST 1 VIEW COMPARISON:  Prior film same day FINDINGS: Cardiomediastinal silhouette is stable. Persistent streaky right basilar atelectasis or infiltrate. There is no pneumothorax. Degenerative changes right shoulder. No pulmonary edema. Moderate gaseous distension of the stomach. IMPRESSION: Persistent streaky right basilar atelectasis or infiltrate. No pneumothorax. Degenerative changes right shoulder. Electronically Signed   By: Lahoma Crocker M.D.   On: 12/03/2014 15:58   Dg Chest Port 1 View  12/03/2014  CLINICAL DATA:  Found unresponsive on the floor, history of breast cancer, hyponatremia EXAM: PORTABLE CHEST 1 VIEW COMPARISON:  08/21/2014 FINDINGS: Cardiomediastinal silhouette is unremarkable. There is streaky right basilar atelectasis or infiltrate. Moderate gaseous distension of the stomach. No pulmonary edema. IMPRESSION: Streaky right basilar atelectasis or infiltrate. Aspiration cannot be excluded. Moderate gastric distension of the stomach. No pulmonary edema. Electronically Signed   By: Lahoma Crocker M.D.   On: 12/03/2014 13:34   I have personally reviewed and evaluated these images and lab results as part of my medical decision-making.  EKG Interpretation   Date/Time:  Sunday December 03 2014 10:25:34 EST Ventricular Rate:  87 PR Interval:  190 QRS Duration: 87 QT Interval:  409 QTC Calculation: 492 R Axis:   67 Text Interpretation:  Sinus rhythm Biatrial enlargement Low voltage,  precordial leads Probable anteroseptal infarct, old Minimal ST depression,  anterior leads Minimal ST elevation, inferior leads since last tracing no  significant change Confirmed by [facility]  MD, Betta Balla ([id number]) on 12/03/2014   10:45:47 AM      MDM   Final diagnoses:  Seizure ([place])  Hypothermia, initial encounter  Altered mental status, unspecified altered mental status type  Hyperammonemia ([place])     Hypothermia, with seizure. No clear evidence for acute infectious source. Suspect infection. Antibiotics started, prior to lumbar puncture. No respiratory distress. Chest x-ray does not indicate clear pneumonia, with differential including pneumonia and aspiration. Patient has complicated allergy profile. Broad-spectrum coverage is ordered. She has nonspecific mild elevation of the ammonia level. She will require admission with close monitoring, and likely consultation with neurology.   Nursing Notes Reviewed/ Care Coordinated, and agree without changes. Applicable Imaging Reviewed.  Interpretation of Laboratory Data incorporated into ED treatment  Plan: Admit  I personally performed the services described in this documentation, which was scribed in my presence. The recorded information has been reviewed and is accurate.      Daleen Bo, MD 12/05/14 [phone]

## 2014-12-04 ENCOUNTER — Encounter (HOSPITAL_COMMUNITY): Payer: Self-pay | Admitting: *Deleted

## 2014-12-04 ENCOUNTER — Inpatient Hospital Stay (HOSPITAL_COMMUNITY): Payer: Medicare Other

## 2014-12-04 DIAGNOSIS — K226 Gastro-esophageal laceration-hemorrhage syndrome: Secondary | ICD-10-CM | POA: Diagnosis present

## 2014-12-04 DIAGNOSIS — R402432 Glasgow coma scale score 3-8, at arrival to emergency department: Secondary | ICD-10-CM

## 2014-12-04 LAB — CBC WITH DIFFERENTIAL/PLATELET
BAND NEUTROPHILS: 2 %
BASOS PCT: 0 %
Basophils Absolute: 0 10*3/uL (ref 0.0–0.1)
Blasts: 0 %
EOS ABS: 0 10*3/uL (ref 0.0–0.7)
EOS PCT: 0 %
HEMATOCRIT: 23.7 % — AB (ref 36.0–46.0)
Hemoglobin: 8.3 g/dL — ABNORMAL LOW (ref 12.0–15.0)
LYMPHS ABS: 0.1 10*3/uL — AB (ref 0.7–4.0)
LYMPHS PCT: 5 %
MCH: 35.2 pg — ABNORMAL HIGH (ref 26.0–34.0)
MCHC: 35 g/dL (ref 30.0–36.0)
MCV: 100.4 fL — ABNORMAL HIGH (ref 78.0–100.0)
MONO ABS: 0.1 10*3/uL (ref 0.1–1.0)
MONOS PCT: 3 %
Metamyelocytes Relative: 0 %
Myelocytes: 0 %
NEUTROS ABS: 1.9 10*3/uL (ref 1.7–7.7)
Neutrophils Relative %: 90 %
OTHER: 0 %
Platelets: 102 10*3/uL — ABNORMAL LOW (ref 150–400)
Promyelocytes Absolute: 0 %
RBC: 2.36 MIL/uL — ABNORMAL LOW (ref 3.87–5.11)
RDW: 20.9 % — AB (ref 11.5–15.5)
WBC: 2.1 10*3/uL — ABNORMAL LOW (ref 4.0–10.5)
nRBC: 6 /100 WBC — ABNORMAL HIGH

## 2014-12-04 LAB — COMPREHENSIVE METABOLIC PANEL
ALBUMIN: 2.7 g/dL — AB (ref 3.5–5.0)
ALT: 18 U/L (ref 14–54)
ANION GAP: 11 (ref 5–15)
AST: 45 U/L — AB (ref 15–41)
Alkaline Phosphatase: 85 U/L (ref 38–126)
BILIRUBIN TOTAL: 0.8 mg/dL (ref 0.3–1.2)
BUN: 56 mg/dL — ABNORMAL HIGH (ref 6–20)
CALCIUM: 7.3 mg/dL — AB (ref 8.9–10.3)
CO2: 10 mmol/L — AB (ref 22–32)
Chloride: 128 mmol/L — ABNORMAL HIGH (ref 101–111)
Creatinine, Ser: 2.06 mg/dL — ABNORMAL HIGH (ref 0.44–1.00)
GFR, EST AFRICAN AMERICAN: 27 mL/min — AB (ref >60)
GFR, EST NON AFRICAN AMERICAN: 24 mL/min — AB (ref >60)
Glucose, Bld: 126 mg/dL — ABNORMAL HIGH (ref 65–99)
Potassium: 2.4 mmol/L — CL (ref 3.5–5.1)
Sodium: 149 mmol/L — ABNORMAL HIGH (ref 135–145)
Total Protein: 5.3 g/dL — ABNORMAL LOW (ref 6.5–8.1)

## 2014-12-04 LAB — CBC
HCT: 24.1 % — ABNORMAL LOW (ref 36.0–46.0)
HEMOGLOBIN: 8.5 g/dL — AB (ref 12.0–15.0)
MCH: 34.8 pg — ABNORMAL HIGH (ref 26.0–34.0)
MCHC: 35.3 g/dL (ref 30.0–36.0)
MCV: 98.8 fL (ref 78.0–100.0)
PLATELETS: 86 10*3/uL — AB (ref 150–400)
RBC: 2.44 MIL/uL — AB (ref 3.87–5.11)
RDW: 21.3 % — ABNORMAL HIGH (ref 11.5–15.5)
WBC: 2.7 10*3/uL — ABNORMAL LOW (ref 4.0–10.5)

## 2014-12-04 LAB — BASIC METABOLIC PANEL
Anion gap: 10 (ref 5–15)
BUN: 59 mg/dL — ABNORMAL HIGH (ref 6–20)
CHLORIDE: 125 mmol/L — AB (ref 101–111)
CO2: 13 mmol/L — AB (ref 22–32)
Calcium: 7.1 mg/dL — ABNORMAL LOW (ref 8.9–10.3)
Creatinine, Ser: 2.13 mg/dL — ABNORMAL HIGH (ref 0.44–1.00)
GFR calc Af Amer: 26 mL/min — ABNORMAL LOW (ref >60)
GFR calc non Af Amer: 23 mL/min — ABNORMAL LOW (ref >60)
GLUCOSE: 139 mg/dL — AB (ref 65–99)
POTASSIUM: 2.7 mmol/L — AB (ref 3.5–5.1)
SODIUM: 148 mmol/L — AB (ref 135–145)

## 2014-12-04 LAB — PROCALCITONIN: PROCALCITONIN: 15.48 ng/mL

## 2014-12-04 LAB — URINE CULTURE
Culture: NO GROWTH
SPECIAL REQUESTS: NORMAL

## 2014-12-04 LAB — MAGNESIUM: MAGNESIUM: 1.9 mg/dL (ref 1.7–2.4)

## 2014-12-04 LAB — BLOOD GAS, ARTERIAL
Acid-Base Excess: 13.1 mmol/L — ABNORMAL HIGH (ref 0.0–2.0)
Bicarbonate: 14.7 mEq/L — ABNORMAL LOW (ref 20.0–24.0)
Drawn by: 22223
FIO2: 35
MECHVT: 500 mL
O2 Saturation: 99.7 %
PATIENT TEMPERATURE: 37
PCO2 ART: 15.6 mmHg — AB (ref 35.0–45.0)
PEEP: 5 cmH2O
PO2 ART: 178 mmHg — AB (ref 80.0–100.0)
RATE: 20 resp/min
TCO2: 11.6 mmol/L (ref 0–100)
pH, Arterial: 7.443 (ref 7.350–7.450)

## 2014-12-04 LAB — MISCELLANEOUS TEST

## 2014-12-04 LAB — PREPARE RBC (CROSSMATCH)

## 2014-12-04 LAB — ETHYLENE GLYCOL: ETHYLENE GLYCOL LVL: NOT DETECTED mg/dL

## 2014-12-04 LAB — PROTIME-INR
INR: 1.63 — ABNORMAL HIGH (ref 0.00–1.49)
Prothrombin Time: 19.4 seconds — ABNORMAL HIGH (ref 11.6–15.2)

## 2014-12-04 LAB — LACTIC ACID, PLASMA: LACTIC ACID, VENOUS: 1.5 mmol/L (ref 0.5–2.0)

## 2014-12-04 LAB — VDRL, CSF: SYPHILIS VDRL QUANT CSF: NONREACTIVE

## 2014-12-04 LAB — MRSA PCR SCREENING: MRSA BY PCR: POSITIVE — AB

## 2014-12-04 LAB — TSH: TSH: 2.745 u[IU]/mL (ref 0.350–4.500)

## 2014-12-04 LAB — APTT: APTT: 55 s — AB (ref 24–37)

## 2014-12-04 MED ORDER — ANTISEPTIC ORAL RINSE SOLUTION (CORINZ)
7.0000 mL | Freq: Four times a day (QID) | OROMUCOSAL | Status: DC
  Administered 2014-12-04 – 2014-12-09 (×19): 7 mL via OROMUCOSAL

## 2014-12-04 MED ORDER — LEVOFLOXACIN IN D5W 750 MG/150ML IV SOLN
750.0000 mg | INTRAVENOUS | Status: DC
  Administered 2014-12-05: 750 mg via INTRAVENOUS
  Filled 2014-12-04 (×2): qty 150

## 2014-12-04 MED ORDER — PIPERACILLIN-TAZOBACTAM 3.375 G IVPB
INTRAVENOUS | Status: AC
  Filled 2014-12-04: qty 100

## 2014-12-04 MED ORDER — MUPIROCIN 2 % EX OINT
1.0000 | TOPICAL_OINTMENT | Freq: Two times a day (BID) | CUTANEOUS | Status: AC
Start: 2014-12-04 — End: 2014-12-08
  Administered 2014-12-04 – 2014-12-08 (×10): 1 via NASAL
  Filled 2014-12-04: qty 22

## 2014-12-04 MED ORDER — LEVETIRACETAM 500 MG/5ML IV SOLN
INTRAVENOUS | Status: AC
  Filled 2014-12-04: qty 10

## 2014-12-04 MED ORDER — POTASSIUM CHLORIDE 10 MEQ/100ML IV SOLN
10.0000 meq | INTRAVENOUS | Status: AC
  Administered 2014-12-04 (×4): 10 meq via INTRAVENOUS
  Filled 2014-12-04 (×4): qty 100

## 2014-12-04 MED ORDER — PANTOPRAZOLE SODIUM 40 MG IV SOLR
40.0000 mg | Freq: Two times a day (BID) | INTRAVENOUS | Status: DC
  Administered 2014-12-04 – 2014-12-09 (×11): 40 mg via INTRAVENOUS
  Filled 2014-12-04 (×11): qty 40

## 2014-12-04 MED ORDER — PIPERACILLIN-TAZOBACTAM 3.375 G IVPB
3.3750 g | Freq: Three times a day (TID) | INTRAVENOUS | Status: DC
  Filled 2014-12-04: qty 50

## 2014-12-04 MED ORDER — SODIUM CHLORIDE 0.9 % IV SOLN
Freq: Once | INTRAVENOUS | Status: AC
  Administered 2014-12-04: 16:00:00 via INTRAVENOUS

## 2014-12-04 MED ORDER — DEXTROSE 5 % IV SOLN
1.0000 g | Freq: Three times a day (TID) | INTRAVENOUS | Status: DC
  Administered 2014-12-04 – 2014-12-06 (×6): 1 g via INTRAVENOUS
  Filled 2014-12-04 (×10): qty 1

## 2014-12-04 MED ORDER — SODIUM CHLORIDE 0.9 % IV SOLN
1000.0000 mg | Freq: Two times a day (BID) | INTRAVENOUS | Status: DC
  Filled 2014-12-04 (×3): qty 10

## 2014-12-04 MED ORDER — CHLORHEXIDINE GLUCONATE CLOTH 2 % EX PADS
6.0000 | MEDICATED_PAD | Freq: Every day | CUTANEOUS | Status: AC
  Administered 2014-12-05 – 2014-12-08 (×4): 6 via TOPICAL

## 2014-12-04 MED ORDER — ACETAMINOPHEN 10 MG/ML IV SOLN
1000.0000 mg | Freq: Four times a day (QID) | INTRAVENOUS | Status: AC | PRN
  Administered 2014-12-04: 1000 mg via INTRAVENOUS
  Filled 2014-12-04 (×2): qty 100

## 2014-12-04 MED ORDER — CHLORHEXIDINE GLUCONATE 0.12% ORAL RINSE (MEDLINE KIT)
15.0000 mL | Freq: Two times a day (BID) | OROMUCOSAL | Status: DC
  Administered 2014-12-04 – 2014-12-09 (×11): 15 mL via OROMUCOSAL

## 2014-12-04 MED ORDER — PIPERACILLIN-TAZOBACTAM 3.375 G IVPB: 3.3750 g | Freq: Three times a day (TID) | INTRAVENOUS | Status: DC

## 2014-12-04 MED ORDER — LEVETIRACETAM 500 MG/5ML IV SOLN
1000.0000 mg | Freq: Two times a day (BID) | INTRAVENOUS | Status: DC
  Administered 2014-12-04: 1000 mg via INTRAVENOUS
  Filled 2014-12-04 (×2): qty 10

## 2014-12-04 MED ORDER — SODIUM CHLORIDE 0.9 % IV BOLUS (SEPSIS)
1000.0000 mL | Freq: Once | INTRAVENOUS | Status: AC
  Administered 2014-12-04: 1000 mL via INTRAVENOUS

## 2014-12-04 MED ORDER — ACETAMINOPHEN 10 MG/ML IV SOLN
INTRAVENOUS | Status: AC
  Filled 2014-12-04: qty 100

## 2014-12-04 MED FILL — Medication: Qty: 1 | Status: AC

## 2014-12-04 NOTE — Consult Note (Addendum)
Catherine A. [name], MD     [email or web address]          Catherine Mccullough is an 69 y.o. female.   ASSESSMENT/PLAN: Acute severe encephalopathy of unclear etiology but associated with very high CSF protein, seizures and hypothermia. CSF analysis and other testing does not support diagnosis of infection at this time. The differential diagnosis for very high CSF protein includes bacterial related meningitis, TB meningitis, brain tumors, intracranial trauma and intracranial hemorrhage. Given the seizures, I think this is the most likely etiology however. 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  RECOMMENDATION: The patient will be started on high dose Keppra IV. EEG will be obtained. CSF will be sent for acid-fast bacillus to evaluate for possible TB meningitis. Imaging of the brain with IV contrast once the patient is stable. MRI is preferable. Additional blood test for the following: HIV, RPR, vitamin B12 and thyroid function tests.  The patient is a 69 year old black female who was found unresponsive. She had not been seen for 3 days prior to this. The patient was taken to the hospital where she was unresponsive but did seem to have a waxing and waning course. She was up to talk at some point in time but her speech was incoherent and nonsensical. The patient apparently was noted to have frothing at her mouth. When the emergency room doctor pointing to evaluate the patient she had a witnessed generalized tonic-clonic seizure. At present the patient started having bleeding from the mouth and was eventually intubated. She underwent EGD for GI bleed which was felt to be due to Mallory-Weiss tear. The patient has been unresponsive since then. She was given sedation for intubation  but has not gotten any sedation afterwards. She has been hypothermic. She has had evidence of shock with hypotension and tachycardia requiring pressors. GENERAL: The patient is intubated but not sedated.  HEENT: Supple. Atraumatic normocephalic.   ABDOMEN: soft  EXTREMITIES: No edema   BACK: Normal.  SKIN: Normal by inspection.    MENTAL STATUS: Eyes are closed. She did open her eyes momentarily to deep painful stimuli. She does not follow commands.  CRANIAL NERVES: The pupils are deviated to the left and downward; oculocephalic reflexes are intact however; there is downbeat nystagmus observed spontaneously; upper and lower facial muscles are normal in strength and symmetric, there is no flattening of the nasolabial folds. Cough and gag reflexes are intact.  MOTOR: Bulk and tone are normal throughout. The patient withdrawals to deep painful stimuli bilaterally.  COORDINATION: No tremors or dysmetria is appreciated.  REFLEXES: Deep tendon reflexes are symmetrical and normal. Babinski reflexes are flexor on the right but upgoing on the left  SENSATION: She responds to deep painful stimuli bilaterally   Head CT scan is reviewed in person and shows evidence of encephalomalacia and hypodensity involving the inferior frontal area approximating the gyrus recti bilaterally more prominent on the right side.   Blood pressure 129/55, pulse 150, temperature 100.4 F (38 C), temperature source Core (Comment), resp. rate 19, height 4' 7"  (1.397 m), weight 52.1 kg (114 lb 13.8 oz), SpO2 100 %.  Past Medical History  Diagnosis Date  . Kidney disease   . Hypertension   . Diabetes mellitus   . 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 Arthritis   .  CHF (congestive heart failure) ([place])   . Lymphedema of leg   . Anxiety   . Pulmonary hypertension ([place])   . 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 Pancreatitis   . Depression   . Anemia   . Shingles   . PUD (peptic ulcer  disease)   . GERD (gastroesophageal reflux disease)   . MRSA (methicillin resistant staph aureus) culture positive     Past Surgical History  Procedure Laterality Date  . Breast surgery    . Cholecystectomy    . Abdominal hysterectomy    . Replacement total knee bilateral    . Hand tendon surgery      Family History  Problem Relation Age of Onset  . Cancer Paternal Grandfather   . Diabetes Sister   . Diabetes Sister   . Heart disease Maternal Grandmother   . Heart disease Father   . Hyperlipidemia      Social History:  reports that she has never smoked. She has never used smokeless tobacco. She reports that she does not drink alcohol or use illicit drugs.  Allergies:  Allergies  Allergen Reactions  . Mometasone Shortness Of Breath  . Vancomycin Itching  . Aspirin Other (See Comments)    "Inflames stomach"  . Contrast Media [Iodinated Diagnostic Agents] Other (See Comments)    Made Heart Stop.   . Cortisone Other (See Comments)    Hold fluid  . Doxycycline Nausea And Vomiting  . Ibuprofen Nausea And Vomiting  . Insulins Other (See Comments)    Pt says even the tiniest bit of insulin makes her go unconscious because her BS gets too low  . Lasix [Furosemide] Other (See Comments)    Paradoxical Response  . Other     Iv bp med unknown.and adhesive tape-silicones  . Prednisone     Sweating   . Sulfamethoxazole Other (See Comments)    Bottomed out platelets  . Tape   . Ultram [Tramadol Hcl] Other (See Comments)    "Grand mal seizure"  . Codeine Rash  . Dilantin [Phenytoin Sodium] Rash  . Latex Rash  . Piperacillin Sod-Tazobactam So Rash    Medications: Prior to Admission medications   Medication Sig Start Date End Date Taking? Authorizing Provider  aspirin EC 81 MG tablet Take 81 mg by mouth daily.   Yes Historical Provider, MD  bisacodyl (DULCOLAX) 5 MG EC tablet Take 5 mg by mouth daily as needed for moderate constipation.   Yes Historical Provider, MD    bumetanide (BUMEX) 1 MG tablet Take 1 mg by mouth daily.    Yes Historical Provider, MD  calcitRIOL (ROCALTROL) 0.25 MCG capsule Take 0.25 mcg by mouth every other day.   Yes Historical Provider, MD  cetirizine (ZYRTEC) 10 MG tablet Take 5 mg by mouth daily as needed for allergies.   Yes Historical Provider, MD  diphenhydramine-acetaminophen (TYLENOL PM) 25-500 MG TABS tablet Take 1 tablet by mouth at bedtime as needed (sleep).   Yes Historical Provider, MD  docusate sodium (COLACE) 100 MG capsule Take 100 mg by mouth 2 (two) times daily.   Yes Historical Provider, MD  esomeprazole (NEXIUM) 40 MG capsule Take 40 mg by mouth daily before breakfast.     Yes Historical Provider, MD  fluticasone (FLONASE) 50 MCG/ACT nasal spray Place 1 spray into both nostrils daily as needed for allergies.  03/24/14  Yes Historical Provider, MD  hydrocortisone cream 1 % Apply 1 application topically daily as  needed for itching.   Yes Historical Provider, MD  LYRICA 75 MG capsule Take 75 mg by mouth 3 (three) times daily. 12/14/13  Yes Historical Provider, MD  magnesium oxide (MAG-OX) 400 MG tablet Take 400 mg by mouth daily.   Yes Historical Provider, MD  Melatonin 3 MG CAPS Take 3 mg by mouth at bedtime.   Yes Historical Provider, MD  metoprolol (TOPROL-XL) 200 MG 24 hr tablet Take 200 mg by mouth daily. Take along with 50 mg tablet to equal 200 mg.   Yes Historical Provider, MD  metoprolol succinate (TOPROL-XL) 100 MG 24 hr tablet Take 1 tablet (100 mg total) by mouth daily. Take with or immediately following a meal. 08/27/14  Yes [name], MD  metoprolol succinate (TOPROL-XL) 50 MG 24 hr tablet Take 50 mg by mouth daily. Take along with 200 mg tablet to equal 250 mg daily.   Yes Historical Provider, MD  montelukast (SINGULAIR) 10 MG tablet Take 10 mg by mouth at bedtime.     Yes Historical Provider, MD  Multiple Vitamins-Minerals (MULTIVITAMINS THER. W/MINERALS) TABS Take 1 tablet by mouth daily.     Yes  Historical Provider, MD  oxyCODONE-acetaminophen (PERCOCET) 10-325 MG per tablet Take 1 tablet by mouth every 6 (six) hours as needed for pain. 11/03/13  Yes Carmin Muskrat, MD  polyethylene glycol [facility] / GLYCOLAX) packet Take 17 g by mouth daily as needed (constipation).    Yes Historical Provider, MD  PROAIR HFA 108 (90 BASE) MCG/ACT inhaler Take 2 puffs by mouth every 6 (six) hours as needed. 04/11/14  Yes Historical Provider, MD  sertraline (ZOLOFT) 50 MG tablet Take 100 mg by mouth at bedtime.   Yes Historical Provider, MD  clonazePAM (KLONOPIN) 0.5 MG tablet Take 1 tablet by mouth twice daily x 30 days. (use for Klonopin) Patient not taking: Reported on 12/04/2014 10/08/12   [name], DO  sodium bicarbonate 650 MG tablet Take 1 tablet (650 mg total) by mouth 2 (two) times daily. Patient not taking: Reported on 12/04/2014 08/27/14   [name], MD    Scheduled Meds: . sodium chloride   Intravenous Once  . antiseptic oral rinse  7 mL Mouth Rinse QID  . aztreonam  1 g Intravenous Q8H  . chlorhexidine gluconate  15 mL Mouth Rinse BID  . [START ON 12/05/2014] Chlorhexidine Gluconate Cloth  6 each Topical Q0600  . [START ON 12/05/2014] levETIRAcetam  1,000 mg Intravenous Q12H  . [START ON 12/05/2014] levofloxacin (LEVAQUIN) IV  750 mg Intravenous Q48H  . linezolid (ZYVOX) IV  600 mg Intravenous Q12H  . mupirocin ointment  1 application Nasal BID  . pantoprazole (PROTONIX) IV  40 mg Intravenous Q12H  . sodium chloride  1,000 mL Intravenous Once  . sodium chloride  3 mL Intravenous Q12H   Continuous Infusions: . phenylephrine (NEO-SYNEPHRINE) Adult infusion Stopped (12/04/14 1552)  .  sodium bicarbonate  infusion 1000 mL 100 mL/hr at 12/04/14 1857   PRN Meds:.acetaminophen, fentaNYL (SUBLIMAZE) injection, fentaNYL (SUBLIMAZE) injection, midazolam, midazolam     Results for orders placed or performed during the hospital encounter of 12/03/14 (from the past 48 hour(s))    CBG monitoring, ED     Status: None   Collection Time: 12/03/14 10:42 AM  Result Value Ref Range   Glucose-Capillary 97 65 - 99 mg/dL  Ammonia     Status: Abnormal   Collection Time: 12/03/14 11:00 AM  Result Value Ref Range   Ammonia 40 (H)  9 - 35 umol/L  Comprehensive metabolic panel     Status: Abnormal   Collection Time: 12/03/14 11:00 AM  Result Value Ref Range   Sodium 147 (H) 135 - 145 mmol/L   Potassium 5.2 (H) 3.5 - 5.1 mmol/L   Chloride 124 (H) 101 - 111 mmol/L   CO2 5 (L) 22 - 32 mmol/L   Glucose, Bld 115 (H) 65 - 99 mg/dL   BUN 66 (H) 6 - 20 mg/dL   Creatinine, Ser 2.47 (H) 0.44 - 1.00 mg/dL   Calcium 10.6 (H) 8.9 - 10.3 mg/dL   Total Protein 8.3 (H) 6.5 - 8.1 g/dL   Albumin 4.5 3.5 - 5.0 g/dL   AST 40 15 - 41 U/L   ALT 20 14 - 54 U/L   Alkaline Phosphatase 107 38 - 126 U/L   Total Bilirubin 0.8 0.3 - 1.2 mg/dL   GFR calc non Af Amer 19 (L) >60 mL/min   GFR calc Af Amer 22 (L) >60 mL/min    Comment: (NOTE) The eGFR has been calculated using the CKD EPI equation. This calculation has not been validated in all clinical situations. eGFR's persistently <60 mL/min signify possible Chronic Kidney Disease.    Anion gap 18 (H) 5 - 15  CBC WITH DIFFERENTIAL     Status: Abnormal   Collection Time: 12/03/14 11:00 AM  Result Value Ref Range   WBC 18.6 (H) 4.0 - 10.5 K/uL    Comment: WHITE COUNT CONFIRMED ON SMEAR   RBC 3.89 3.87 - 5.11 MIL/uL   Hemoglobin 13.7 12.0 - 15.0 g/dL   HCT 40.5 36.0 - 46.0 %   MCV 104.1 (H) 78.0 - 100.0 fL   MCH 35.2 (H) 26.0 - 34.0 pg   MCHC 33.8 30.0 - 36.0 g/dL   RDW 21.0 (H) 11.5 - 15.5 %   Platelets 205 150 - 400 K/uL    Comment: SPECIMEN CHECKED FOR CLOTS PLATELET COUNT CONFIRMED BY SMEAR    Neutrophils Relative % 91 %   Lymphocytes Relative 4 %   Monocytes Relative 5 %   Eosinophils Relative 0 %   Basophils Relative 0 %   Neutro Abs 17.0 (H) 1.7 - 7.7 K/uL   Lymphs Abs 0.7 0.7 - 4.0 K/uL   Monocytes Absolute 0.9 0.1 - 1.0  K/uL   Eosinophils Absolute 0.0 0.0 - 0.7 K/uL   Basophils Absolute 0.0 0.0 - 0.1 K/uL   RBC Morphology POLYCHROMASIA PRESENT   Ethanol     Status: None   Collection Time: 12/03/14 11:00 AM  Result Value Ref Range   Alcohol, Ethyl (B) <5 <5 mg/dL    Comment:        LOWEST DETECTABLE LIMIT FOR SERUM ALCOHOL IS 5 mg/dL FOR MEDICAL PURPOSES ONLY   Lactic acid, plasma     Status: Abnormal   Collection Time: 12/03/14 11:00 AM  Result Value Ref Range   Lactic Acid, Venous 2.4 (HH) 0.5 - 2.0 mmol/L    Comment: CRITICAL RESULT CALLED TO, READ BACK BY AND VERIFIED WITH: BLACKBUR, C AT 1139 ON 12/03/2014 BY WOODS,M   Urine culture     Status: None   Collection Time: 12/03/14 11:00 AM  Result Value Ref Range   Specimen Description URINE, CATHETERIZED    Special Requests Normal    Culture      NO GROWTH 1 DAY Performed at [facility]    Report Status 12/04/2014 FINAL   Culture, blood (routine x 2)  Status: None (Preliminary result)   Collection Time: 12/03/14 11:00 AM  Result Value Ref Range   Specimen Description RIGHT ANTECUBITAL    Special Requests BOTTLES DRAWN AEROBIC AND ANAEROBIC 6CC EACH    Culture NO GROWTH < 24 HOURS    Report Status PENDING   Urinalysis, Routine w reflex microscopic (not at [facility])     Status: Abnormal   Collection Time: 12/03/14 11:00 AM  Result Value Ref Range   Color, Urine YELLOW YELLOW   APPearance CLEAR CLEAR   Specific Gravity, Urine 1.025 1.005 - 1.030   pH 5.0 5.0 - 8.0   Glucose, UA NEGATIVE NEGATIVE mg/dL   Hgb urine dipstick SMALL (A) NEGATIVE   Bilirubin Urine NEGATIVE NEGATIVE   Ketones, ur NEGATIVE NEGATIVE mg/dL   Protein, ur 30 (A) NEGATIVE mg/dL   Nitrite NEGATIVE NEGATIVE   Leukocytes, UA NEGATIVE NEGATIVE  Salicylate level     Status: None   Collection Time: 12/03/14 11:00 AM  Result Value Ref Range   Salicylate Lvl <4.3 2.8 - 30.0 mg/dL  Acetaminophen level     Status: Abnormal   Collection Time: 12/03/14 11:00 AM    Result Value Ref Range   Acetaminophen (Tylenol), Serum <10 (L) 10 - 30 ug/mL    Comment:        THERAPEUTIC CONCENTRATIONS VARY SIGNIFICANTLY. A RANGE OF 10-30 ug/mL MAY BE AN EFFECTIVE CONCENTRATION FOR MANY PATIENTS. HOWEVER, SOME ARE BEST TREATED AT CONCENTRATIONS OUTSIDE THIS RANGE. ACETAMINOPHEN CONCENTRATIONS >150 ug/mL AT 4 HOURS AFTER INGESTION AND >50 ug/mL AT 12 HOURS AFTER INGESTION ARE OFTEN ASSOCIATED WITH TOXIC REACTIONS.   Urine rapid drug screen (hosp performed)     Status: None   Collection Time: 12/03/14 11:00 AM  Result Value Ref Range   Opiates NONE DETECTED NONE DETECTED   Cocaine NONE DETECTED NONE DETECTED   Benzodiazepines NONE DETECTED NONE DETECTED   Amphetamines NONE DETECTED NONE DETECTED   Tetrahydrocannabinol NONE DETECTED NONE DETECTED   Barbiturates NONE DETECTED NONE DETECTED    Comment:        DRUG SCREEN FOR MEDICAL PURPOSES ONLY.  IF CONFIRMATION IS NEEDED FOR ANY PURPOSE, NOTIFY LAB WITHIN 5 DAYS.        LOWEST DETECTABLE LIMITS FOR URINE DRUG SCREEN Drug Class       Cutoff (ng/mL) Amphetamine      1000 Barbiturate      200 Benzodiazepine   329 Tricyclics       518 Opiates          300 Cocaine          300 THC              50   Urine microscopic-add on     Status: Abnormal   Collection Time: 12/03/14 11:00 AM  Result Value Ref Range   Squamous Epithelial / LPF NONE SEEN NONE SEEN    Comment: Please note change in reference range.   WBC, UA NONE SEEN 0 - 5 WBC/hpf    Comment: Please note change in reference range.   RBC / HPF 0-5 0 - 5 RBC/hpf    Comment: Please note change in reference range.   Bacteria, UA FEW (A) NONE SEEN    Comment: Please note change in reference range.  Culture, blood (routine x 2)     Status: None (Preliminary result)   Collection Time: 12/03/14 11:20 AM  Result Value Ref Range   Specimen Description RIGHT ANTECUBITAL    Special Requests BOTTLES  DRAWN AEROBIC AND ANAEROBIC 6CC EACH    Culture NO  GROWTH < 24 HOURS    Report Status PENDING   CK     Status: Abnormal   Collection Time: 12/03/14 12:00 PM  Result Value Ref Range   Total CK 356 (H) 38 - 234 U/L  CSF cell count with differential collection tube #: 4     Status: Abnormal   Collection Time: 12/03/14  4:29 PM  Result Value Ref Range   Tube # 4    Color, CSF COLORLESS COLORLESS   Appearance, CSF CLEAR CLEAR   Supernatant NOT INDICATED    RBC Count, CSF 3 (H) 0 /cu mm   WBC, CSF 2 0 - 5 /cu mm   Segmented Neutrophils-CSF TOO FEW TO COUNT, SMEAR AVAILABLE FOR REVIEW 0 - 6 %   Lymphs, CSF TOO FEW TO COUNT, SMEAR AVAILABLE FOR REVIEW 40 - 80 %   Monocyte-Macrophage-Spinal Fluid TOO FEW TO COUNT, SMEAR AVAILABLE FOR REVIEW 15 - 45 %   Eosinophils, CSF TOO FEW TO COUNT, SMEAR AVAILABLE FOR REVIEW 0 - 1 %   Other Cells, CSF TOO FEW TO COUNT, SMEAR AVAILABLE FOR REVIEW   CSF culture     Status: None (Preliminary result)   Collection Time: 12/03/14  4:29 PM  Result Value Ref Range   Specimen Description BACK    Special Requests Immunocompromised    Gram Stain      CYTOSPIN SLIDE NO WBC SEEN NO ORGANISMS SEEN CONFIRMED BY R.GREENE    Culture      NO GROWTH < 24 HOURS Performed at [facility]    Report Status PENDING   Gram stain     Status: None   Collection Time: 12/03/14  4:29 PM  Result Value Ref Range   Specimen Description BACK    Special Requests Immunocompromised    Gram Stain NO ORGANISMS SEEN NO WBC SEEN     Report Status 12/03/2014 FINAL   Glucose, CSF     Status: Abnormal   Collection Time: 12/03/14  4:29 PM  Result Value Ref Range   Glucose, CSF 82 (H) 40 - 70 mg/dL  Protein, CSF     Status: Abnormal   Collection Time: 12/03/14  4:29 PM  Result Value Ref Range   Total  Protein, CSF >600 (H) 15 - 45 mg/dL    Comment: RESULTS CONFIRMED BY MANUAL DILUTION  Cryptococcal antigen, CSF     Status: None   Collection Time: 12/03/14  4:29 PM  Result Value Ref Range   Crypto Ag NEGATIVE NEGATIVE     Cryptococcal Ag Titer NOT INDICATED NOT INDICATED    Comment: Performed at [facility]  VDRL, CSF     Status: None   Collection Time: 12/03/14  4:29 PM  Result Value Ref Range   VDRL Quant, CSF Non Reactive Non Rea:<1:1    Comment: (NOTE) Performed At: [facility] [place], [place] [id number] Lindon Romp MD UU:[id number]   Miscellaneous test     Status: None   Collection Time: 12/03/14  4:29 PM  Result Value Ref Range   Miscellaneous Test [facility] AT [facility] REPORT TO FOLLOW    Miscellaneous Test Results SEE SEPARATE REPORT   I-Stat CG4 Lactic Acid, ED     Status: None   Collection Time: 12/03/14  5:53 PM  Result Value Ref Range   Lactic Acid, Venous 1.23 0.5 - 2.0 mmol/L  Osmolality     Status:  Abnormal   Collection Time: 12/03/14  5:59 PM  Result Value Ref Range   Osmolality 330 (HH) 275 - 295 mOsm/kg    Comment: Please note change in reference range. CRITICAL RESULT CALLED TO, READ BACK BY AND VERIFIED WITH:  TALLBOT,T @ 2045 ON 12/03/14 BY WOODIE,J(WOOTEN,K MC)   Ethylene glycol     Status: None   Collection Time: 12/03/14  5:59 PM  Result Value Ref Range   Ethylene Glycol Lvl None Detected None detected mg/dL    Comment: (NOTE) TESTING PERFORMED BY NANCY DENNY.  RESULTS CALLED TO JAMIE WOODIE ON 12-04-2014 AT 12:38 AM.                                Detection Limit = 5 Performed At: [facility] [place], [place] [id number] Lindon Romp MD ME:[id number] CORRECTED ON 11/28 AT 0133: PREVIOUSLY REPORTED AS NONE DETECTED   Blood gas, arterial     Status: Abnormal   Collection Time: 12/03/14  7:11 PM  Result Value Ref Range   FIO2 0.21    O2 Content 21.0 L/min   Delivery systems ROOM AIR    pH, Arterial 7.036 (LL) 7.350 - 7.450    Comment: CRITICAL RESULT CALLED TO, READ BACK BY AND VERIFIED WITH: PENNY WATKINS,RN 0N 12/03/2014 BY PEVIANY LAWSON,RRT AT 1927    pCO2 arterial 12.1 (LL) 35.0 - 45.0 mmHg     Comment: CRITICAL RESULT CALLED TO, READ BACK BY AND VERIFIED WITH: PENNY WATKINS,RN ON 12/03/2014 BY PEVIANY LAWSON,RRT AT 1927    pO2, Arterial 129 (H) 80.0 - 100.0 mmHg   Bicarbonate 5.9 (L) 20.0 - 24.0 mEq/L   Acid-base deficit 26.0 (H) 0.0 - 2.0 mmol/L   O2 Saturation 97.4 %   Collection site BRACHIAL ARTERY    Drawn by [phone]    Sample type ARTERIAL    Allens test (pass/fail) NOT INDICATED (A) PASS  Type and screen [facility]     Status: None (Preliminary result)   Collection Time: 12/03/14  9:05 PM  Result Value Ref Range   ABO/RH(D) A POS    Antibody Screen NEG    Sample Expiration 12/06/2014    Unit Number [id number]    Blood Component Type RED CELLS,LR    Unit division 00    Status of Unit ALLOCATED    Transfusion Status OK TO TRANSFUSE    Crossmatch Result Compatible    Unit Number [id number]    Blood Component Type RED CELLS,LR    Unit division 00    Status of Unit ISSUED    Transfusion Status OK TO TRANSFUSE    Crossmatch Result Compatible    Unit Number [id number]    Blood Component Type RED CELLS,LR    Unit division 00    Status of Unit ALLOCATED    Transfusion Status OK TO TRANSFUSE    Crossmatch Result Compatible   Prepare RBC     Status: None   Collection Time: 12/03/14  9:05 PM  Result Value Ref Range   Order Confirmation ORDER PROCESSED BY BLOOD BANK   Protime-INR     Status: Abnormal   Collection Time: 12/03/14  9:05 PM  Result Value Ref Range   Prothrombin Time 18.5 (H) 11.6 - 15.2 seconds   INR 1.54 (H) 0.00 - 1.49  APTT     Status: Abnormal   Collection Time: 12/03/14  9:05 PM  Result Value Ref  Range   aPTT 47 (H) 24 - 37 seconds    Comment:        IF BASELINE aPTT IS ELEVATED, SUGGEST PATIENT RISK ASSESSMENT BE USED TO DETERMINE APPROPRIATE ANTICOAGULANT THERAPY.   Blood gas, arterial     Status: Abnormal   Collection Time: 12/03/14 10:50 PM  Result Value Ref Range   FIO2 0.60    Delivery systems PRESSURE REGULATED  VOLUME CONTROL    Mode VENTILATOR    VT 500 mL   LHR 18 resp/min   Peep/cpap 5.0 cm H20   pH, Arterial 7.208 (L) 7.350 - 7.450   pCO2 arterial 21.9 (L) 35.0 - 45.0 mmHg   pO2, Arterial 298.0 (H) 80.0 - 100.0 mmHg   Bicarbonate 10.8 (L) 20.0 - 24.0 mEq/L   Acid-base deficit 18.1 (H) 0.0 - 2.0 mmol/L   O2 Saturation 99.7 %   Patient temperature 37.6    Collection site BRACHIAL ARTERY    Drawn by [phone]    Sample type ARTERIAL    Allens test (pass/fail) NOT INDICATED (A) PASS  CBC with Differential     Status: Abnormal   Collection Time: 12/04/14  1:15 AM  Result Value Ref Range   WBC 2.1 (L) 4.0 - 10.5 K/uL   RBC 2.36 (L) 3.87 - 5.11 MIL/uL   Hemoglobin 8.3 (L) 12.0 - 15.0 g/dL    Comment: DELTA CHECK NOTED   HCT 23.7 (L) 36.0 - 46.0 %   MCV 100.4 (H) 78.0 - 100.0 fL   MCH 35.2 (H) 26.0 - 34.0 pg   MCHC 35.0 30.0 - 36.0 g/dL   RDW 20.9 (H) 11.5 - 15.5 %   Platelets 102 (L) 150 - 400 K/uL    Comment: SPECIMEN CHECKED FOR CLOTS   Neutrophils Relative % 90 %   Lymphocytes Relative 5 %   Monocytes Relative 3 %   Eosinophils Relative 0 %   Basophils Relative 0 %   Band Neutrophils 2 %   Metamyelocytes Relative 0 %   Myelocytes 0 %   Promyelocytes Absolute 0 %   Blasts 0 %   nRBC 6 (H) 0 /100 WBC   Other 0 %   Neutro Abs 1.9 1.7 - 7.7 K/uL   Lymphs Abs 0.1 (L) 0.7 - 4.0 K/uL   Monocytes Absolute 0.1 0.1 - 1.0 K/uL   Eosinophils Absolute 0.0 0.0 - 0.7 K/uL   Basophils Absolute 0.0 0.0 - 0.1 K/uL   RBC Morphology RARE NRBCs   Comprehensive metabolic panel     Status: Abnormal   Collection Time: 12/04/14  1:15 AM  Result Value Ref Range   Sodium 149 (H) 135 - 145 mmol/L   Potassium 2.4 (LL) 3.5 - 5.1 mmol/L    Comment: RESULT REPEATED AND VERIFIED CRITICAL RESULT CALLED TO, READ BACK BY AND VERIFIED WITH:  NEILSON,T @ 0340 ON 12/04/14 BY WOODIE,J    Chloride 128 (H) 101 - 111 mmol/L   CO2 10 (L) 22 - 32 mmol/L   Glucose, Bld 126 (H) 65 - 99 mg/dL   BUN 56 (H) 6 - 20  mg/dL   Creatinine, Ser 2.06 (H) 0.44 - 1.00 mg/dL   Calcium 7.3 (L) 8.9 - 10.3 mg/dL    Comment: DELTA CHECK NOTED   Total Protein 5.3 (L) 6.5 - 8.1 g/dL   Albumin 2.7 (L) 3.5 - 5.0 g/dL   AST 45 (H) 15 - 41 U/L   ALT 18 14 - 54 U/L   Alkaline Phosphatase 85  38 - 126 U/L   Total Bilirubin 0.8 0.3 - 1.2 mg/dL   GFR calc non Af Amer 24 (L) >60 mL/min   GFR calc Af Amer 27 (L) >60 mL/min    Comment: (NOTE) The eGFR has been calculated using the CKD EPI equation. This calculation has not been validated in all clinical situations. eGFR's persistently <60 mL/min signify possible Chronic Kidney Disease.    Anion gap 11 5 - 15  Lactic acid, plasma     Status: None   Collection Time: 12/04/14  1:15 AM  Result Value Ref Range   Lactic Acid, Venous 1.5 0.5 - 2.0 mmol/L  Procalcitonin     Status: None   Collection Time: 12/04/14  1:15 AM  Result Value Ref Range   Procalcitonin 15.48 ng/mL    Comment:        Interpretation: PCT >= 10 ng/mL: Important systemic inflammatory response, almost exclusively due to severe bacterial sepsis or septic shock. (NOTE)         ICU PCT Algorithm               Non ICU PCT Algorithm    ----------------------------     ------------------------------         PCT < 0.25 ng/mL                 PCT < 0.1 ng/mL     Stopping of antibiotics            Stopping of antibiotics       strongly encouraged.               strongly encouraged.    ----------------------------     ------------------------------       PCT level decrease by               PCT < 0.25 ng/mL       >= 80% from peak PCT       OR PCT 0.25 - 0.5 ng/mL          Stopping of antibiotics                                             encouraged.     Stopping of antibiotics           encouraged.    ----------------------------     ------------------------------       PCT level decrease by              PCT >= 0.25 ng/mL       < 80% from peak PCT        AND PCT >= 0.5 ng/mL             Continuing  antibiotics                                              encouraged.       Continuing antibiotics            encouraged.    ----------------------------     ------------------------------     PCT level increase compared          PCT > 0.5 ng/mL         with peak PCT AND  PCT >= 0.5 ng/mL             Escalation of antibiotics                                          strongly encouraged.      Escalation of antibiotics        strongly encouraged.   TSH     Status: None   Collection Time: 12/04/14  1:15 AM  Result Value Ref Range   TSH 2.745 0.350 - 4.500 uIU/mL  MRSA PCR Screening     Status: Abnormal   Collection Time: 12/04/14  2:31 AM  Result Value Ref Range   MRSA by PCR POSITIVE (A) NEGATIVE    Comment:        The GeneXpert MRSA Assay (FDA approved for NASAL specimens only), is one component of a comprehensive MRSA colonization surveillance program. It is not intended to diagnose MRSA infection nor to guide or monitor treatment for MRSA infections. RESULT CALLED TO, READ BACK BY AND VERIFIED WITH: Carron Curie ON [id number] AT 0610 BY RESSEGGER R   Magnesium     Status: None   Collection Time: 12/04/14  4:50 AM  Result Value Ref Range   Magnesium 1.9 1.7 - 2.4 mg/dL  Blood gas, arterial     Status: Abnormal   Collection Time: 12/04/14  5:59 AM  Result Value Ref Range   FIO2 35.00    Delivery systems VENTILATOR    Mode PRESSURE REGULATED VOLUME CONTROL    VT 500 mL   LHR 20 resp/min   Peep/cpap 5.0 cm H20   pH, Arterial 7.443 7.350 - 7.450   pCO2 arterial 15.6 (LL) 35.0 - 45.0 mmHg    Comment: CRITICAL RESULT CALLED TO, READ BACK BY AND VERIFIED WITH:  TRACEY NEILSON RN BY K KNICK RRT RCP ON 12/04/2014 AT 0608    pO2, Arterial 178 (H) 80.0 - 100.0 mmHg   Bicarbonate 14.7 (L) 20.0 - 24.0 mEq/L   TCO2 11.6 0 - 100 mmol/L   Acid-Base Excess 13.1 (H) 0.0 - 2.0 mmol/L   O2 Saturation 99.7 %   Patient temperature 37.0    Collection site RIGHT BRACHIAL     Drawn by [id number]    Sample type ARTERIAL    Allens test (pass/fail) PASS PASS  Basic metabolic panel     Status: Abnormal   Collection Time: 12/04/14 10:25 AM  Result Value Ref Range   Sodium 148 (H) 135 - 145 mmol/L   Potassium 2.7 (LL) 3.5 - 5.1 mmol/L    Comment: CRITICAL RESULT CALLED TO, READ BACK BY AND VERIFIED WITH: ROWE,N AT 11:05AM ON 12/04/14 BY FESTERMAN,C    Chloride 125 (H) 101 - 111 mmol/L   CO2 13 (L) 22 - 32 mmol/L   Glucose, Bld 139 (H) 65 - 99 mg/dL   BUN 59 (H) 6 - 20 mg/dL   Creatinine, Ser 2.13 (H) 0.44 - 1.00 mg/dL   Calcium 7.1 (L) 8.9 - 10.3 mg/dL   GFR calc non Af Amer 23 (L) >60 mL/min   GFR calc Af Amer 26 (L) >60 mL/min    Comment: (NOTE) The eGFR has been calculated using the CKD EPI equation. This calculation has not been validated in all clinical situations. eGFR's persistently <60 mL/min signify possible Chronic Kidney Disease.    Anion gap 10 5 - 15  CBC  Status: Abnormal   Collection Time: 12/04/14 10:25 AM  Result Value Ref Range   WBC 2.7 (L) 4.0 - 10.5 K/uL   RBC 2.44 (L) 3.87 - 5.11 MIL/uL   Hemoglobin 8.5 (L) 12.0 - 15.0 g/dL   HCT 24.1 (L) 36.0 - 46.0 %   MCV 98.8 78.0 - 100.0 fL   MCH 34.8 (H) 26.0 - 34.0 pg   MCHC 35.3 30.0 - 36.0 g/dL   RDW 21.3 (H) 11.5 - 15.5 %   Platelets 86 (L) 150 - 400 K/uL    Comment: SPECIMEN CHECKED FOR CLOTS PLATELET COUNT CONFIRMED BY SMEAR   Protime-INR     Status: Abnormal   Collection Time: 12/04/14 10:25 AM  Result Value Ref Range   Prothrombin Time 19.4 (H) 11.6 - 15.2 seconds   INR 1.63 (H) 0.00 - 1.49  APTT     Status: Abnormal   Collection Time: 12/04/14 10:25 AM  Result Value Ref Range   aPTT 55 (H) 24 - 37 seconds    Comment:        IF BASELINE aPTT IS ELEVATED, SUGGEST PATIENT RISK ASSESSMENT BE USED TO DETERMINE APPROPRIATE ANTICOAGULANT THERAPY.   Prepare RBC     Status: None   Collection Time: 12/04/14 10:42 AM  Result Value Ref Range   Order Confirmation ORDER PROCESSED  BY BLOOD BANK     Studies/Results:   CSF: WBC C2, RBC 3, glucose 82, protein greater than 600 and VDRL nonreactive .   Ammonia 40. CPK 356.   Catherine Mccullough, M.D.  Diplomate, Tax adviser of Psychiatry and Neurology ( Neurology). 12/04/2014, 11:44 PM

## 2014-12-04 NOTE — Progress Notes (Signed)
[facility] PROGRESS NOTE  Catherine Mccullough FXT:[id number] DOB: May 12, 1945    PCP:   Default, Provider, MD   HPI: Catherine Mccullough is an 69 y.o. female admitted with hx of CKD4, HTN, breast cancer with bone mets, DM, pulm HTN, admitted for being down with unclear etiology and unknown how long, hypothermic, metabolic acidosis with incomplete respiratory compensation, hypotensive, hypovolemic, seizure, and septic.  She developed upper GI Bleed found by EGD at bedside yesterday by Dr Eden Lathe, clipped and has no further evidence of bleeding.  He is now at 38 degree, on pressor, intubated, and tachycardic.    Rewiew of Systems: Unable.   Past Medical History  Diagnosis Date  . Kidney disease   . Hypertension   . Diabetes mellitus   . Cancer ([place])     brest met to bone  . Arthritis   . CHF (congestive heart failure) ([place])   . Lymphedema of leg   . Anxiety   . Pulmonary hypertension ([place])   . 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 Pancreatitis   . Depression   . Anemia   . Shingles   . PUD (peptic ulcer disease)   . GERD (gastroesophageal reflux disease)   . MRSA (methicillin resistant staph aureus) culture positive     Past Surgical History  Procedure Laterality Date  . Breast surgery    . Cholecystectomy    . Abdominal hysterectomy    . Replacement total knee bilateral    . Hand tendon surgery      Medications:  HOME MEDS: Prior to Admission medications   Medication Sig Start Date End Date Taking? Authorizing Provider  aspirin EC 81 MG tablet Take 81 mg by mouth daily.   Yes Historical Provider, MD  bisacodyl (DULCOLAX) 5 MG EC tablet Take 5 mg by mouth daily as needed for moderate constipation.   Yes Historical Provider, MD  bumetanide (BUMEX) 1 MG tablet Take 1 mg by mouth daily.    Yes Historical Provider, MD  calcitRIOL (ROCALTROL) 0.25 MCG capsule Take 0.25 mcg by mouth every other day.   Yes Historical Provider, MD  cetirizine (ZYRTEC)  10 MG tablet Take 5 mg by mouth daily as needed for allergies.   Yes Historical Provider, MD  diphenhydramine-acetaminophen (TYLENOL PM) 25-500 MG TABS tablet Take 1 tablet by mouth at bedtime as needed (sleep).   Yes Historical Provider, MD  esomeprazole (NEXIUM) 40 MG capsule Take 40 mg by mouth daily before breakfast.     Yes Historical Provider, MD  fluticasone (FLONASE) 50 MCG/ACT nasal spray Place 1 spray into both nostrils daily as needed for allergies.  03/24/14  Yes Historical Provider, MD  hydrocortisone cream 1 % Apply 1 application topically daily as needed for itching.   Yes Historical Provider, MD  LYRICA 75 MG capsule Take 75 mg by mouth 3 (three) times daily. 12/14/13  Yes Historical Provider, MD  magnesium oxide (MAG-OX) 400 MG tablet Take 400 mg by mouth daily.   Yes Historical Provider, MD  Melatonin 3 MG CAPS Take 3 mg by mouth at bedtime.   Yes Historical Provider, MD  metoprolol (TOPROL-XL) 200 MG 24 hr tablet Take 200 mg by mouth daily. Take along with 50 mg tablet to equal 200 mg.   Yes Historical Provider, MD  metoprolol succinate (TOPROL-XL) 50 MG 24 hr tablet Take 50 mg by mouth daily. Take along with 200 mg tablet to equal 250 mg daily.   Yes  Historical Provider, MD  montelukast (SINGULAIR) 10 MG tablet Take 10 mg by mouth at bedtime.     Yes Historical Provider, MD  PROAIR HFA 108 (90 BASE) MCG/ACT inhaler Take 2 puffs by mouth every 6 (six) hours as needed. 04/11/14  Yes Historical Provider, MD  sertraline (ZOLOFT) 50 MG tablet Take 100 mg by mouth at bedtime.   Yes Historical Provider, MD  citalopram (CELEXA) 10 MG tablet Take 1 tablet by mouth daily. 05/01/14   Historical Provider, MD  clonazePAM (KLONOPIN) 0.5 MG tablet Take 1 tablet by mouth twice daily x 30 days. (use for Klonopin) Patient taking differently: Take 0.5 mg by mouth 2 (two) times daily.  10/08/12   Catherine L Reed, DO  docusate sodium (COLACE) 100 MG capsule Take 100 mg by mouth 2 (two) times daily.     Historical Provider, MD  exemestane (AROMASIN) 25 MG tablet Take 25 mg by mouth daily.      Historical Provider, MD  metoprolol succinate (TOPROL-XL) 100 MG 24 hr tablet Take 1 tablet (100 mg total) by mouth daily. Take with or immediately following a meal. 08/27/14   [name], MD  Multiple Vitamins-Minerals (MULTIVITAMINS THER. W/MINERALS) TABS Take 1 tablet by mouth daily.      Historical Provider, MD  oxyCODONE-acetaminophen (PERCOCET) 10-325 MG per tablet Take 1 tablet by mouth every 6 (six) hours as needed for pain. Patient taking differently: Take 1 tablet by mouth every 4 (four) hours as needed for pain.  11/03/13   Carmin Muskrat, MD  polyethylene glycol [facility] / Floria Raveling) packet Take 17 g by mouth daily as needed (constipation).     Historical Provider, MD  sodium bicarbonate 650 MG tablet Take 1 tablet (650 mg total) by mouth 2 (two) times daily. 08/27/14   [name], MD     Allergies:  Allergies  Allergen Reactions  . Mometasone Shortness Of Breath  . Vancomycin Itching  . Aspirin Other (See Comments)    "Inflames stomach"  . Contrast Media [Iodinated Diagnostic Agents] Other (See Comments)    Made Heart Stop.   . Cortisone Other (See Comments)    Hold fluid  . Doxycycline Nausea And Vomiting  . Ibuprofen Nausea And Vomiting  . Insulins Other (See Comments)    Pt says even the tiniest bit of insulin makes her go unconscious because her BS gets too low  . Lasix [Furosemide] Other (See Comments)    Paradoxical Response  . Other     Iv bp med unknown.and adhesive tape-silicones  . Prednisone     Sweating   . Sulfamethoxazole Other (See Comments)    Bottomed out platelets  . Tape   . Ultram [Tramadol Hcl] Other (See Comments)    "Grand mal seizure"  . Codeine Rash  . Dilantin [Phenytoin Sodium] Rash  . Latex Rash  . Piperacillin Sod-Tazobactam So Rash    Social History:   reports that she has never smoked. She has never used smokeless tobacco. She  reports that she does not drink alcohol or use illicit drugs.  Family History: Family History  Problem Relation Age of Onset  . Cancer Paternal Grandfather   . Diabetes Sister   . Diabetes Sister   . Heart disease Maternal Grandmother   . Heart disease Father   . Hyperlipidemia       Physical Exam: Filed Vitals:   12/04/14 0630 12/04/14 0645 12/04/14 0700 12/04/14 0715  BP: 135/44 131/75 119/49 114/60  Pulse:  115  Temp: 99.3 F (37.4 C) 99.5 F (37.5 C) 99.5 F (37.5 C) 99.7 F (37.6 C)  TempSrc:      Resp: _0 Height:      Weight:      SpO2:    100%   Blood pressure 114/60, pulse 115, temperature 99.7 F (37.6 C), temperature source Core (Comment), resp. rate 26, height _1  (1.397 m), weight 52.1 kg (114 lb 13.8 oz), SpO2 100 %.  GEN:  Comatose.  HEENT: Mucous membranes pink and anicteric; Breasts:: Not examined CHEST WALL: No tenderness CHEST: Normal respiration, clear to auscultation bilaterally.  HEART: Regular rate and rhythm.  There are no murmur, rub, or gallops.   BACK: No kyphosis or scoliosis; no CVA tenderness ABDOMEN: soft and non-tender; no masses, no organomegaly, normal abdominal bowel sounds; no pannus; no intertriginous candida. There is no rebound and no distention. Rectal Exam: Not done EXTREMITIES: No bone or joint deformity; age-appropriate arthropathy of the hands and knees; no edema; no ulcerations.  There is no calf tenderness. Genitalia: not examined PULSES: 2+ and symmetric SKIN: Normal hydration no rash or ulceration PHX:[id number].    Labs on Admission:  Basic Metabolic Panel:  Recent Labs Lab 12/03/14 1100 12/04/14 0115 12/04/14 0450  NA 147* 149*  --   K 5.2* 2.4*  --   CL 124* 128*  --   CO2 5* 10*  --   GLUCOSE 115* 126*  --   BUN 66* 56*  --   CREATININE 2.47* 2.06*  --   CALCIUM 10.6* 7.3*  --   MG  --   --  1.9   Liver Function Tests:  Recent Labs Lab 12/03/14 1100 12/04/14 0115  AST 40 45*   ALT 20 18  ALKPHOS 107 85  BILITOT 0.8 0.8  PROT 8.3* 5.3*  ALBUMIN 4.5 2.7*   No results for input(s): LIPASE, AMYLASE in the last 168 hours.  Recent Labs Lab 12/03/14 1100  AMMONIA 40*   CBC:  Recent Labs Lab 12/03/14 1100 12/04/14 0115  WBC 18.6* 2.1*  NEUTROABS 17.0* 1.9  HGB 13.7 8.3*  HCT 40.5 23.7*  MCV 104.1* 100.4*  PLT 205 102*   Cardiac Enzymes:  Recent Labs Lab 12/03/14 1200  CKTOTAL 356*    CBG:  Recent Labs Lab 12/03/14 1042  GLUCAP 97     Radiological Exams on Admission: Ct Head Wo Contrast  12/03/2014  CLINICAL DATA:  69 year old female found down by family. Uncertain how long patient has been down (last contact with family members was 3 days ago). EXAM: CT HEAD WITHOUT CONTRAST TECHNIQUE: Contiguous axial images were obtained from the base of the skull through the vertex without intravenous contrast. COMPARISON:  Head CT 04/26/2014. FINDINGS: Low-attenuation in the inferior aspect of the right frontal lobe, mild low-attenuation in the inferior aspect of the medial left frontal lobe, and low-attenuation in the anterior aspect of the right temporal lobe, all similar to the prior examination, likely to reflect gliosis from remote trauma. Physiologic calcifications in the basal ganglia bilaterally. No acute intracranial abnormalities. Specifically, no evidence of acute intracranial hemorrhage, no definite findings of acute/subacute cerebral ischemia, no mass, mass effect, hydrocephalus or abnormal intra or extra-axial fluid collections. Visualized paranasal sinuses and mastoids are well pneumatized. No acute displaced skull fractures are identified. IMPRESSION: 1. No acute intracranial abnormalities. 2. Areas of probable gliosis in the frontal lobes bilaterally (right greater than left) and anterior aspect of the right temporal lobe, unchanged  compared to the prior study, likely related to remote trauma. Electronically Signed   By: Vinnie Langton M.D.    On: 12/03/2014 13:07   Dg Chest Port 1 View  12/04/2014  CLINICAL DATA:  Sepsis EXAM: PORTABLE CHEST 1 VIEW COMPARISON:  12/03/2014 FINDINGS: Endotracheal tube tip is 3.5 cm above the carina. There is improvement, with partial clearance of basilar opacities, now with better definition of the diaphragmatic contours. No pneumothorax. IMPRESSION: Satisfactory ET tube position. Partial clearance of basilar opacities. Electronically Signed   By: Andreas Newport M.D.   On: 12/04/2014 06:43   Dg Chest Portable 1 View  12/03/2014  CLINICAL DATA:  Intubation. EXAM: PORTABLE CHEST 1 VIEW COMPARISON:  12/03/2014 FINDINGS: The endotracheal tube tip is 1.8 cm above the carina. Streaky opacities persist in both bases. No large effusions. No pneumothorax. IMPRESSION: Satisfactory ET tube position. No other significant interval change. Electronically Signed   By: Andreas Newport M.D.   On: 12/03/2014 21:26   Dg Chest Port 1 View  12/03/2014  CLINICAL DATA:  Follow-up right subclavian central line attempt EXAM: PORTABLE CHEST 1 VIEW COMPARISON:  Prior film same day FINDINGS: Cardiomediastinal silhouette is stable. Persistent streaky right basilar atelectasis or infiltrate. There is no pneumothorax. Degenerative changes right shoulder. No pulmonary edema. Moderate gaseous distension of the stomach. IMPRESSION: Persistent streaky right basilar atelectasis or infiltrate. No pneumothorax. Degenerative changes right shoulder. Electronically Signed   By: Lahoma Crocker M.D.   On: 12/03/2014 15:58   Dg Chest Port 1 View  12/03/2014  CLINICAL DATA:  Found unresponsive on the floor, history of breast cancer, hyponatremia EXAM: PORTABLE CHEST 1 VIEW COMPARISON:  08/21/2014 FINDINGS: Cardiomediastinal silhouette is unremarkable. There is streaky right basilar atelectasis or infiltrate. Moderate gaseous distension of the stomach. No pulmonary edema. IMPRESSION: Streaky right basilar atelectasis or infiltrate. Aspiration cannot  be excluded. Moderate gastric distension of the stomach. No pulmonary edema. Electronically Signed   By: Lahoma Crocker M.D.   On: 12/03/2014 13:34   Assessment/Plan Present on Admission:  . Altered mental status . Breast cancer metastasized to bone ([place]) . CKD (chronic kidney disease) stage 4, GFR 15-29 ml/min ([facility]) . Dehydration  PLAN:   I spoke with her son [name] this morning again, and updated him with her condition, including metabolic derrangements, sepsis, respiratory failure, bleeding, requirement for pressor support, and that she remained critically ill, and may not survive.  We will continue with current management.  He again expressed understanding and appreciation.   Hypothermia:  She is now normothermic.   Hypotensive:  Improved, we ll give her Blood, and attempt to decrease pressor today. CKD:  Stable.  Cr is improving. Sepsis:  BC, urine culture, and spinal fluid cultured.  Continue Zyvox, Atreonam, and Levoquin.  Respiratory failure:  Will remain on ventilator today.  Nutrition: will start TPN today.  Anemia;  Will give 2 units of PRBC today.   Other plans as per orders.  Code Status:FULL CODE.   Orvan Falconer, MD. [facility] Pager [phone] 7pm to 7am.  12/04/2014, 8:31 AM

## 2014-12-04 NOTE — Consult Note (Signed)
Consult requested by: [facility] hospital[facility] Consult requested for respiratory failure:  HPI: This is a 69 year old who has known chronic kidney disease diabetes metastatic breast cancer and diastolic heart failure who was found by her son at home. She was last seen about 3 days ago so it is not clear how long she was down. When she came to the emergency room she was hypothermic and her blood pressure was okay. She was not able to maintain her airway and was intubated for airway protection. She was started on warming was found to have elevated creatinine marked metabolic acidemia but her blood pressure dropped. Since then she has had lumbar puncture has been started on IV Zyvox and aztreonam and Levaquin. She has been hemodynamically unstable and is on Neo-Synephrine. She is on a bicarbonate drip. She is intubated and not responsive at this point.  Past Medical History  Diagnosis Date  . Kidney disease   . Hypertension   . Diabetes mellitus   . Cancer ([place])     brest met to bone  . Arthritis   . CHF (congestive heart failure) ([place])   . Lymphedema of leg   . Anxiety   . Pulmonary hypertension ([place])   . 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 Pancreatitis   . Depression   . Anemia   . Shingles   . PUD (peptic ulcer disease)   . GERD (gastroesophageal reflux disease)   . MRSA (methicillin resistant staph aureus) culture positive      Family History  Problem Relation Age of Onset  . Cancer Paternal Grandfather   . Diabetes Sister   . Diabetes Sister   . Heart disease Maternal Grandmother   . Heart disease Father   . Hyperlipidemia       Social History   Social History  . Marital Status: Divorced    Spouse Name: N/A  . Number of Children: N/A  . Years of Education: N/A   Social History Main Topics  . Smoking status: Never Smoker   . Smokeless tobacco: Never Used  . Alcohol Use: No  . Drug Use: No  . Sexual Activity: Not Currently   Other Topics  Concern  . None   Social History Narrative     ROS: Unobtainable    Objective: Vital signs in last 24 hours: Temp:  [90 F (32.2 C)-99.7 F (37.6 C)] 99.7 F (37.6 C) (11/28 0715) Pulse Rate:  [82-158] 115 (11/28 0715) Resp:  [13-43] 26 (11/28 0715) BP: (67-159)/(22-92) 114/60 mmHg (11/28 0715) SpO2:  [82 %-100 %] 100 % (11/28 0715) FiO2 (%):  [5 %-60 %] 5 % (11/28 0600) Weight:  [52.1 kg (114 lb 13.8 oz)-56.7 kg (125 lb)] 52.1 kg (114 lb 13.8 oz) (11/27 2249) Weight change:  Last BM Date:  (unknown)  Intake/Output from previous day: 11/27 0701 - 11/28 0700 In: 3071.8 [I.V.:2221.8; IV Piggyback:850] Out: 300 [Urine:300]  PHYSICAL EXAM She is intubated and not responsive. Her pupils do react. Nose and throat are clear. Mucous membranes are moist. Her neck is supple without masses. Her chest shows some rhonchi bilaterally. Her heart is regular with tachycardia at about 120. Abdomen is soft no masses are felt bowel sounds are sluggish extremities show no edema. Central nervous system examination she is unresponsive.  Lab Results: Basic Metabolic Panel:  Recent Labs  12/03/14 1100 12/04/14 0115 12/04/14 0450  NA 147* 149*  --   K 5.2* 2.4*  --   CL 124*  128*  --   CO2 5* 10*  --   GLUCOSE 115* 126*  --   BUN 66* 56*  --   CREATININE 2.47* 2.06*  --   CALCIUM 10.6* 7.3*  --   MG  --   --  1.9   Liver Function Tests:  Recent Labs  12/03/14 1100 12/04/14 0115  AST 40 45*  ALT 20 18  ALKPHOS 107 85  BILITOT 0.8 0.8  PROT 8.3* 5.3*  ALBUMIN 4.5 2.7*   No results for input(s): LIPASE, AMYLASE in the last 72 hours.  Recent Labs  12/03/14 1100  AMMONIA 40*   CBC:  Recent Labs  12/03/14 1100 12/04/14 0115  WBC 18.6* 2.1*  NEUTROABS 17.0* 1.9  HGB 13.7 8.3*  HCT 40.5 23.7*  MCV 104.1* 100.4*  PLT 205 102*   Cardiac Enzymes:  Recent Labs  12/03/14 1200  CKTOTAL 356*   BNP: No results for input(s): PROBNP in the last 72 hours. D-Dimer: No  results for input(s): DDIMER in the last 72 hours. CBG:  Recent Labs  12/03/14 1042  GLUCAP 97   Hemoglobin A1C: No results for input(s): HGBA1C in the last 72 hours. Fasting Lipid Panel: No results for input(s): CHOL, HDL, LDLCALC, TRIG, CHOLHDL, LDLDIRECT in the last 72 hours. Thyroid Function Tests:  Recent Labs  12/04/14 0115  TSH 2.745   Anemia Panel: No results for input(s): VITAMINB12, FOLATE, FERRITIN, TIBC, IRON, RETICCTPCT in the last 72 hours. Coagulation:  Recent Labs  12/03/14 2105  LABPROT 18.5*  INR 1.54*   Urine Drug Screen: Drugs of Abuse     Component Value Date/Time   LABOPIA NONE DETECTED 12/03/2014 1100   COCAINSCRNUR NONE DETECTED 12/03/2014 1100   LABBENZ NONE DETECTED 12/03/2014 1100   AMPHETMU NONE DETECTED 12/03/2014 1100   THCU NONE DETECTED 12/03/2014 1100   LABBARB NONE DETECTED 12/03/2014 1100    Alcohol Level:  Recent Labs  12/03/14 1100  ETH <5   Urinalysis:  Recent Labs  12/03/14 1100  COLORURINE YELLOW  LABSPEC 1.025  PHURINE 5.0  GLUCOSEU NEGATIVE  HGBUR SMALL*  BILIRUBINUR NEGATIVE  KETONESUR NEGATIVE  PROTEINUR 30*  NITRITE NEGATIVE  LEUKOCYTESUR NEGATIVE   Misc. Labs:   ABGS:  Recent Labs  12/04/14 0559  PHART 7.443  PO2ART 178*  TCO2 11.6  HCO3 14.7*     MICROBIOLOGY: Recent Results (from the past 240 hour(s))  CSF culture     Status: None (Preliminary result)   Collection Time: 12/03/14  4:29 PM  Result Value Ref Range Status   Specimen Description BACK  Final   Special Requests Immunocompromised  Final   Gram Stain   Final    CYTOSPIN SLIDE NO WBC SEEN NO ORGANISMS SEEN CONFIRMED BY R.GREENE Performed at [facility]    Culture PENDING  Incomplete   Report Status PENDING  Incomplete  Gram stain     Status: None   Collection Time: 12/03/14  4:29 PM  Result Value Ref Range Status   Specimen Description BACK  Final   Special Requests Immunocompromised  Final   Gram Stain NO  ORGANISMS SEEN NO WBC SEEN   Final   Report Status 12/03/2014 FINAL  Final  MRSA PCR Screening     Status: Abnormal   Collection Time: 12/04/14  2:31 AM  Result Value Ref Range Status   MRSA by PCR POSITIVE (A) NEGATIVE Final    Comment:        The GeneXpert MRSA Assay (FDA  approved for NASAL specimens only), is one component of a comprehensive MRSA colonization surveillance program. It is not intended to diagnose MRSA infection nor to guide or monitor treatment for MRSA infections. RESULT CALLED TO, READ BACK BY AND VERIFIED WITH: Carron Curie ON [id number] AT 0610 BY RESSEGGER R     Studies/Results: Ct Head Wo Contrast  12/03/2014  CLINICAL DATA:  69 year old female found down by family. Uncertain how long patient has been down (last contact with family members was 3 days ago). EXAM: CT HEAD WITHOUT CONTRAST TECHNIQUE: Contiguous axial images were obtained from the base of the skull through the vertex without intravenous contrast. COMPARISON:  Head CT 04/26/2014. FINDINGS: Low-attenuation in the inferior aspect of the right frontal lobe, mild low-attenuation in the inferior aspect of the medial left frontal lobe, and low-attenuation in the anterior aspect of the right temporal lobe, all similar to the prior examination, likely to reflect gliosis from remote trauma. Physiologic calcifications in the basal ganglia bilaterally. No acute intracranial abnormalities. Specifically, no evidence of acute intracranial hemorrhage, no definite findings of acute/subacute cerebral ischemia, no mass, mass effect, hydrocephalus or abnormal intra or extra-axial fluid collections. Visualized paranasal sinuses and mastoids are well pneumatized. No acute displaced skull fractures are identified. IMPRESSION: 1. No acute intracranial abnormalities. 2. Areas of probable gliosis in the frontal lobes bilaterally (right greater than left) and anterior aspect of the right temporal lobe, unchanged compared to the prior  study, likely related to remote trauma. Electronically Signed   By: Vinnie Langton M.D.   On: 12/03/2014 13:07   Dg Chest Port 1 View  12/04/2014  CLINICAL DATA:  Sepsis EXAM: PORTABLE CHEST 1 VIEW COMPARISON:  12/03/2014 FINDINGS: Endotracheal tube tip is 3.5 cm above the carina. There is improvement, with partial clearance of basilar opacities, now with better definition of the diaphragmatic contours. No pneumothorax. IMPRESSION: Satisfactory ET tube position. Partial clearance of basilar opacities. Electronically Signed   By: Andreas Newport M.D.   On: 12/04/2014 06:43   Dg Chest Portable 1 View  12/03/2014  CLINICAL DATA:  Intubation. EXAM: PORTABLE CHEST 1 VIEW COMPARISON:  12/03/2014 FINDINGS: The endotracheal tube tip is 1.8 cm above the carina. Streaky opacities persist in both bases. No large effusions. No pneumothorax. IMPRESSION: Satisfactory ET tube position. No other significant interval change. Electronically Signed   By: Andreas Newport M.D.   On: 12/03/2014 21:26   Dg Chest Port 1 View  12/03/2014  CLINICAL DATA:  Follow-up right subclavian central line attempt EXAM: PORTABLE CHEST 1 VIEW COMPARISON:  Prior film same day FINDINGS: Cardiomediastinal silhouette is stable. Persistent streaky right basilar atelectasis or infiltrate. There is no pneumothorax. Degenerative changes right shoulder. No pulmonary edema. Moderate gaseous distension of the stomach. IMPRESSION: Persistent streaky right basilar atelectasis or infiltrate. No pneumothorax. Degenerative changes right shoulder. Electronically Signed   By: Lahoma Crocker M.D.   On: 12/03/2014 15:58   Dg Chest Port 1 View  12/03/2014  CLINICAL DATA:  Found unresponsive on the floor, history of breast cancer, hyponatremia EXAM: PORTABLE CHEST 1 VIEW COMPARISON:  08/21/2014 FINDINGS: Cardiomediastinal silhouette is unremarkable. There is streaky right basilar atelectasis or infiltrate. Moderate gaseous distension of the stomach. No  pulmonary edema. IMPRESSION: Streaky right basilar atelectasis or infiltrate. Aspiration cannot be excluded. Moderate gastric distension of the stomach. No pulmonary edema. Electronically Signed   By: Lahoma Crocker M.D.   On: 12/03/2014 13:34    Medications:  Prior to Admission:  Prescriptions prior to admission  Medication Sig Dispense Refill Last Dose  . aspirin EC 81 MG tablet Take 81 mg by mouth daily.   unknown  . bisacodyl (DULCOLAX) 5 MG EC tablet Take 5 mg by mouth daily as needed for moderate constipation.   unknown  . bumetanide (BUMEX) 1 MG tablet Take 1 mg by mouth daily.    unknown at Unknown time  . calcitRIOL (ROCALTROL) 0.25 MCG capsule Take 0.25 mcg by mouth every other day.   unknown  . cetirizine (ZYRTEC) 10 MG tablet Take 5 mg by mouth daily as needed for allergies.   unknown  . diphenhydramine-acetaminophen (TYLENOL PM) 25-500 MG TABS tablet Take 1 tablet by mouth at bedtime as needed (sleep).   unknown  . esomeprazole (NEXIUM) 40 MG capsule Take 40 mg by mouth daily before breakfast.     unknown  . fluticasone (FLONASE) 50 MCG/ACT nasal spray Place 1 spray into both nostrils daily as needed for allergies.    unknown  . hydrocortisone cream 1 % Apply 1 application topically daily as needed for itching.   unknown  . LYRICA 75 MG capsule Take 75 mg by mouth 3 (three) times daily.   unknown at Unknown time  . magnesium oxide (MAG-OX) 400 MG tablet Take 400 mg by mouth daily.   unknown at Unknown time  . Melatonin 3 MG CAPS Take 3 mg by mouth at bedtime.   unknown  . metoprolol (TOPROL-XL) 200 MG 24 hr tablet Take 200 mg by mouth daily. Take along with 50 mg tablet to equal 200 mg.   unknown at Unknown time  . metoprolol succinate (TOPROL-XL) 50 MG 24 hr tablet Take 50 mg by mouth daily. Take along with 200 mg tablet to equal 250 mg daily.   unknown  . montelukast (SINGULAIR) 10 MG tablet Take 10 mg by mouth at bedtime.     unknown at Unknown time  . PROAIR HFA 108 (90 BASE)  MCG/ACT inhaler Take 2 puffs by mouth every 6 (six) hours as needed.   unknown  . sertraline (ZOLOFT) 50 MG tablet Take 100 mg by mouth at bedtime.   unknown at Unknown time  . citalopram (CELEXA) 10 MG tablet Take 1 tablet by mouth daily.   Taking  . clonazePAM (KLONOPIN) 0.5 MG tablet Take 1 tablet by mouth twice daily x 30 days. (use for Klonopin) (Patient taking differently: Take 0.5 mg by mouth 2 (two) times daily. ) 30 tablet 0 Taking  . docusate sodium (COLACE) 100 MG capsule Take 100 mg by mouth 2 (two) times daily.   Taking  . exemestane (AROMASIN) 25 MG tablet Take 25 mg by mouth daily.     Taking  . metoprolol succinate (TOPROL-XL) 100 MG 24 hr tablet Take 1 tablet (100 mg total) by mouth daily. Take with or immediately following a meal. 30 tablet 0 Taking  . Multiple Vitamins-Minerals (MULTIVITAMINS THER. W/MINERALS) TABS Take 1 tablet by mouth daily.     Taking  . oxyCODONE-acetaminophen (PERCOCET) 10-325 MG per tablet Take 1 tablet by mouth every 6 (six) hours as needed for pain. (Patient taking differently: Take 1 tablet by mouth every 4 (four) hours as needed for pain. ) 20 tablet 0 Taking  . polyethylene glycol (MIRALAX / GLYCOLAX) packet Take 17 g by mouth daily as needed (constipation).    Taking  . sodium bicarbonate 650 MG tablet Take 1 tablet (650 mg total) by mouth 2 (two) times daily.   Taking   Scheduled: . sodium chloride  Intravenous Once  . antiseptic oral rinse  7 mL Mouth Rinse QID  . aztreonam  1 g Intravenous Q8H  . chlorhexidine gluconate  15 mL Mouth Rinse BID  . [START ON 12/05/2014] Chlorhexidine Gluconate Cloth  6 each Topical Q0600  . EPINEPHrine      . heparin  5,000 Units Subcutaneous 3 times per day  . linezolid (ZYVOX) IV  600 mg Intravenous Q12H  . mupirocin ointment  1 application Nasal BID  . [START ON 12/07/2014] pantoprazole (PROTONIX) IV  40 mg Intravenous Q12H  . piperacillin-tazobactam (ZOSYN)  IV  3.375 g Intravenous Q8H  . potassium chloride   10 mEq Intravenous Q1 Hr x 4  . sodium chloride  3 mL Intravenous Q12H   Continuous: . pantoprozole (PROTONIX) infusion 8 mg/hr (12/04/14 0644)  . phenylephrine (NEO-SYNEPHRINE) Adult infusion 10 mcg/min (12/03/14 2345)  .  sodium bicarbonate  infusion 1000 mL 100 mL/hr at 12/03/14 2152   OIT:GPQDIYME (SUBLIMAZE) injection, fentaNYL (SUBLIMAZE) injection, midazolam, midazolam  Assesment: She has respiratory failure. She is ventilated. She has intense metabolic acidemia which is being treated with sodium bicarbonate drip but her CO2 on basic metabolic profile earlier this morning is still only 10 and her pH is normal but PCO2 was at 15 to maintain that. She is adequately oxygenated. She is septic and is requiring Neo-Synephrine. She has pancytopenia presumably from sepsis and breast cancer therapy. She seems to be dehydrated. She was hypothermic on admission which has improved. Principal Problem:   Altered mental status Active Problems:   Breast cancer metastasized to bone ([facility])   Dehydration   CKD (chronic kidney disease) stage 4, GFR 15-29 ml/min ([facility])   DM type 2 (diabetes mellitus, type 2) ([place])    Plan: Continue current support. I think it's unlikely she will survive.    LOS: 1 day   Shinika Estelle L 12/04/2014, 7:35 AM

## 2014-12-04 NOTE — Progress Notes (Signed)
[facility] Progress Note Patient Name: JENEVA DRAGOVICH DOB: Sep 23, 1945 MRN: [id number]   Date of Service  12/04/2014  HPI/Events of Note    eICU Interventions  Hypokalemia -repleted      Intervention Category Intermediate Interventions: Electrolyte abnormality - evaluation and management  ALVA,RAKESH V. 12/04/2014, 3:56 AM

## 2014-12-04 NOTE — Progress Notes (Signed)
Pt has scleral edema that hasn't been documented before.  Pt also has low urine output from previous shift.     12/04/14 0820  HEENT  R Eye Other (Comment) (scleral edema)  L Eye Other (Comment) (scleral edema)

## 2014-12-04 NOTE — Progress Notes (Signed)
MD notified of critical K+ level.  Orders given for K+ repletion.

## 2014-12-04 NOTE — Progress Notes (Signed)
Initial Nutrition Assessment  DOCUMENTATION CODES:   Not applicable  INTERVENTION:  TPN per pharmacy  RD will cont follow pt nutrition care pogression  NUTRITION DIAGNOSIS:   Inadequate oral intake related to inability to eat as evidenced by NPO status.  GOAL:   Provide needs based on ASPEN/SCCM guidelines  MONITOR:   Vent status, Labs, Weight trends  REASON FOR ASSESSMENT:   Consult New TPN/TNA  ASSESSMENT:  Patient is currently intubated on ventilator support for airway protection. Pt lives alone and was found on the floor by son. UGIB yesterday.  PN is being started to support her nutritional requirements. Talked with family who say pt usual weight is around 115#. No recent changes in appetite or weight status. She does have a hx of DM, CHF, Kidney disease and breast cancer(metz to bone). Labs: Glucose 139,  Sodium 148, potassium 2.7 (repleted), Mag. 1.9,  GFR-26, HgB-8.5  MV: 13.8 L/min Temp (24hrs), Avg:98.2 F (36.8 C), Min:90.1 F (32.3 C), Max:101.3 F (38.5 C)  Sedative:  fentanyl     Diet Order:  Diet NPO time specified  Skin:   intact  Last BM:   prior to admission  Height:   Ht Readings from Last 1 Encounters:  12/03/14 4\' 7"  (1.397 m)    Weight:   Wt Readings from Last 1 Encounters:  12/03/14 114 lb 13.8 oz (52.1 kg)    Ideal Body Weight:  41.6 kg  BMI:  Body mass index is 26.7 kg/(m^2).  Estimated Nutritional Needs:   Kcal:  1424  Protein:  78 gr   Fluid:  >1500 ml daily  EDUCATION NEEDS:   Education needs no appropriate at this time  Colman Cater MS,RD,CSG,LDN Office: [phone] Pager: [phone]

## 2014-12-04 NOTE — Progress Notes (Signed)
Subjective: Sedated on ventilator   Objective: Vital signs in last 24 hours: Temp:  [90 F (32.2 C)-99.7 F (37.6 C)] 99.7 F (37.6 C) (11/28 0715) Pulse Rate:  [82-158] 115 (11/28 0715) Resp:  [13-43] 26 (11/28 0715) BP: (67-159)/(22-92) 114/60 mmHg (11/28 0715) SpO2:  [82 %-100 %] 100 % (11/28 0715) FiO2 (%):  [5 %-60 %] 5 % (11/28 0600) Weight:  [114 lb 13.8 oz (52.1 kg)-125 lb (56.7 kg)] 114 lb 13.8 oz (52.1 kg) (11/27 2249) Last BM Date:  (unknown) General:   sedated Head:  Normocephalic and atraumatic. Abdomen:  Bowel sounds hypoactive. Facial grimacing with palpation diffusely. Distended but soft Extremities:  Without edema. Neurologic:  Sedated on ventilator    Intake/Output from previous day: 11/27 0701 - 11/28 0700 In: 3071.8 [I.V.:2221.8; IV Piggyback:850] Out: 300 [Urine:300] Intake/Output this shift: Total I/O In: 100 [IV Piggyback:100] Out: -   Lab Results:  Recent Labs  12/03/14 1100 12/04/14 0115  WBC 18.6* 2.1*  HGB 13.7 8.3*  HCT 40.5 23.7*  PLT 205 102*   BMET  Recent Labs  12/03/14 1100 12/04/14 0115  NA 147* 149*  K 5.2* 2.4*  CL 124* 128*  CO2 5* 10*  GLUCOSE 115* 126*  BUN 66* 56*  CREATININE 2.47* 2.06*  CALCIUM 10.6* 7.3*   LFT  Recent Labs  12/03/14 1100 12/04/14 0115  PROT 8.3* 5.3*  ALBUMIN 4.5 2.7*  AST 40 45*  ALT 20 18  ALKPHOS 107 85  BILITOT 0.8 0.8   PT/INR  Recent Labs  12/03/14 2105  LABPROT 18.5*  INR 1.54*     Studies/Results: Ct Head Wo Contrast  12/03/2014  CLINICAL DATA:  69 year old female found down by family. Uncertain how long patient has been down (last contact with family members was 3 days ago). EXAM: CT HEAD WITHOUT CONTRAST TECHNIQUE: Contiguous axial images were obtained from the base of the skull through the vertex without intravenous contrast. COMPARISON:  Head CT 04/26/2014. FINDINGS: Low-attenuation in the inferior aspect of the right frontal lobe, mild low-attenuation in  the inferior aspect of the medial left frontal lobe, and low-attenuation in the anterior aspect of the right temporal lobe, all similar to the prior examination, likely to reflect gliosis from remote trauma. Physiologic calcifications in the basal ganglia bilaterally. No acute intracranial abnormalities. Specifically, no evidence of acute intracranial hemorrhage, no definite findings of acute/subacute cerebral ischemia, no mass, mass effect, hydrocephalus or abnormal intra or extra-axial fluid collections. Visualized paranasal sinuses and mastoids are well pneumatized. No acute displaced skull fractures are identified. IMPRESSION: 1. No acute intracranial abnormalities. 2. Areas of probable gliosis in the frontal lobes bilaterally (right greater than left) and anterior aspect of the right temporal lobe, unchanged compared to the prior study, likely related to remote trauma. Electronically Signed   By: Vinnie Langton M.D.   On: 12/03/2014 13:07   Dg Chest Port 1 View  12/04/2014  CLINICAL DATA:  Sepsis EXAM: PORTABLE CHEST 1 VIEW COMPARISON:  12/03/2014 FINDINGS: Endotracheal tube tip is 3.5 cm above the carina. There is improvement, with partial clearance of basilar opacities, now with better definition of the diaphragmatic contours. No pneumothorax. IMPRESSION: Satisfactory ET tube position. Partial clearance of basilar opacities. Electronically Signed   By: Andreas Newport M.D.   On: 12/04/2014 06:43   Dg Chest Portable 1 View  12/03/2014  CLINICAL DATA:  Intubation. EXAM: PORTABLE CHEST 1 VIEW COMPARISON:  12/03/2014 FINDINGS: The endotracheal tube tip is 1.8 cm above the  carina. Streaky opacities persist in both bases. No large effusions. No pneumothorax. IMPRESSION: Satisfactory ET tube position. No other significant interval change. Electronically Signed   By: Andreas Newport M.D.   On: 12/03/2014 21:26   Dg Chest Port 1 View  12/03/2014  CLINICAL DATA:  Follow-up right subclavian central  line attempt EXAM: PORTABLE CHEST 1 VIEW COMPARISON:  Prior film same day FINDINGS: Cardiomediastinal silhouette is stable. Persistent streaky right basilar atelectasis or infiltrate. There is no pneumothorax. Degenerative changes right shoulder. No pulmonary edema. Moderate gaseous distension of the stomach. IMPRESSION: Persistent streaky right basilar atelectasis or infiltrate. No pneumothorax. Degenerative changes right shoulder. Electronically Signed   By: Lahoma Crocker M.D.   On: 12/03/2014 15:58   Dg Chest Port 1 View  12/03/2014  CLINICAL DATA:  Found unresponsive on the floor, history of breast cancer, hyponatremia EXAM: PORTABLE CHEST 1 VIEW COMPARISON:  08/21/2014 FINDINGS: Cardiomediastinal silhouette is unremarkable. There is streaky right basilar atelectasis or infiltrate. Moderate gaseous distension of the stomach. No pulmonary edema. IMPRESSION: Streaky right basilar atelectasis or infiltrate. Aspiration cannot be excluded. Moderate gastric distension of the stomach. No pulmonary edema. Electronically Signed   By: Lahoma Crocker M.D.   On: 12/03/2014 13:34    Assessment: 69 year old female admitted with mental status changes, hypothermia, hypotension, episode of hematemesis with bedside EGD noting MW tear s/p clips X 2, non-erosive gastritis. Hgb 13 on admission and down to 8.3 as of 0100.  Without any further overt GI bleeding. 2 units PRBCs ordered per hospitalist with repeat CBC post-transfusion. Grim prognosis, unlikely to recover.   Plan: Agree with transfusion Monitor for further overt GI bleeding Remains on Protonix drip: change to Protonix 40 mg IV BID No NG tube for 3 days Recommend supportive care at this time  Orvil Feil, ANP-BC [facility]     LOS: 1 day    12/04/2014, 8:24 AM

## 2014-12-04 NOTE — Progress Notes (Signed)
PARENTERAL NUTRITION CONSULT NOTE - INITIAL  Pharmacy Consult for TPN Indication: prolonged NPO, s/p Mallory Weiss tear with no NG tube for 3 days  Allergies  Allergen Reactions  . Mometasone Shortness Of Breath  . Vancomycin Itching  . Aspirin Other (See Comments)    "Inflames stomach"  . Contrast Media [Iodinated Diagnostic Agents] Other (See Comments)    Made Heart Stop.   . Cortisone Other (See Comments)    Hold fluid  . Doxycycline Nausea And Vomiting  . Ibuprofen Nausea And Vomiting  . Insulins Other (See Comments)    Pt says even the tiniest bit of insulin makes her go unconscious because her BS gets too low  . Lasix [Furosemide] Other (See Comments)    Paradoxical Response  . Other     Iv bp med unknown.and adhesive tape-silicones  . Prednisone     Sweating   . Sulfamethoxazole Other (See Comments)    Bottomed out platelets  . Tape   . Ultram [Tramadol Hcl] Other (See Comments)    "Grand mal seizure"  . Codeine Rash  . Dilantin [Phenytoin Sodium] Rash  . Latex Rash  . Piperacillin Sod-Tazobactam So Rash    Patient Measurements: Height: 4' 7"  (139.7 cm) Weight: 114 lb 13.8 oz (52.1 kg) IBW/kg (Calculated) : 34  Vital Signs: Temp: 99.7 F (37.6 C) (11/28 0715) Temp Source: Core (Comment) (11/27 2249) BP: 114/60 mmHg (11/28 0715) Pulse Rate: 115 (11/28 0715) Intake/Output from previous day: 11/27 0701 - 11/28 0700 In: 3071.8 [I.V.:2221.8; IV Piggyback:850] Out: 300 [Urine:300] Intake/Output from this shift: Total I/O In: 100 [IV Piggyback:100] Out: -   Labs:  Recent Labs  12/03/14 1100 12/03/14 2105 12/04/14 0115  WBC 18.6*  --  2.1*  HGB 13.7  --  8.3*  HCT 40.5  --  23.7*  PLT 205  --  102*  APTT  --  47*  --   INR  --  1.54*  --      Recent Labs  12/03/14 1100 12/04/14 0115 12/04/14 0450  NA 147* 149*  --   K 5.2* 2.4*  --   CL 124* 128*  --   CO2 5* 10*  --   GLUCOSE 115* 126*  --   BUN 66* 56*  --   CREATININE 2.47* 2.06*   --   CALCIUM 10.6* 7.3*  --   MG  --   --  1.9  PROT 8.3* 5.3*  --   ALBUMIN 4.5 2.7*  --   AST 40 45*  --   ALT 20 18  --   ALKPHOS 107 85  --   BILITOT 0.8 0.8  --    Estimated Creatinine Clearance: 17 mL/min (by C-G formula based on Cr of 2.06).    Recent Labs  12/03/14 1042  GLUCAP 97    Medical History: Past Medical History  Diagnosis Date  . Kidney disease   . Hypertension   . Diabetes mellitus   . 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 Arthritis   . CHF (congestive heart failure) ([place])   . Lymphedema of leg   . Anxiety   . Pulmonary hypertension ([place])   . 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 Pancreatitis   . Depression   . Anemia   . Shingles   . PUD (peptic ulcer disease)   . GERD (gastroesophageal reflux disease)   . MRSA (methicillin resistant staph aureus) culture  positive     Insulin Requirements in the past 24 hours:  none   Current Nutrition:  none  Assessment:  Pt is admitted for being down with unclear etiology and unknown how long, hypothermic, metabolic acidosis with incomplete respiratory compensation, hypotensive, hypovolemic, seizure, and septic. She developed upper GI Bleed found by EGD at bedside yesterday by Dr Eden Lathe, clipped and has no further evidence of bleeding. She is now  on pressors and intubated. She will start TPN for prolonged NPO.  Given her metabolic derangements and the inablility to customize TPN formula with Clinimix, will hold off one more day before starting TPN.  Nutritional Goals:  Will f/u RD recommendations  Plan:  Check am TPN labs.  Omarius Grantham Poteet 12/04/2014,10:30 AM

## 2014-12-05 ENCOUNTER — Inpatient Hospital Stay (HOSPITAL_COMMUNITY)
Admit: 2014-12-05 | Discharge: 2014-12-05 | Disposition: A | Discharge status: Home / Self Care | Payer: Medicare Other | Attending: Neurology | Admitting: Neurology

## 2014-12-05 ENCOUNTER — Encounter (HOSPITAL_COMMUNITY): Payer: Self-pay | Admitting: Gastroenterology

## 2014-12-05 ENCOUNTER — Inpatient Hospital Stay (HOSPITAL_COMMUNITY): Payer: Medicare Other

## 2014-12-05 LAB — GLUCOSE, CAPILLARY
GLUCOSE-CAPILLARY: 115 mg/dL — AB (ref 65–99)
Glucose-Capillary: 85 mg/dL (ref 65–99)
Glucose-Capillary: 114 mg/dL — ABNORMAL HIGH (ref 65–99)

## 2014-12-05 LAB — CBC WITH DIFFERENTIAL/PLATELET
BASOS PCT: 0 %
Basophils Absolute: 0 10*3/uL (ref 0.0–0.1)
EOS ABS: 0.1 10*3/uL (ref 0.0–0.7)
EOS PCT: 2 %
HCT: 29.1 % — ABNORMAL LOW (ref 36.0–46.0)
Hemoglobin: 10.2 g/dL — ABNORMAL LOW (ref 12.0–15.0)
LYMPHS ABS: 0.4 10*3/uL — AB (ref 0.7–4.0)
Lymphocytes Relative: 7 %
MCH: 31.8 pg (ref 26.0–34.0)
MCHC: 35.1 g/dL (ref 30.0–36.0)
MCV: 90.7 fL (ref 78.0–100.0)
MONO ABS: 0.2 10*3/uL (ref 0.1–1.0)
MONOS PCT: 4 %
NEUTROS PCT: 87 %
Neutro Abs: 4.8 10*3/uL (ref 1.7–7.7)
PLATELETS: 47 10*3/uL — AB (ref 150–400)
RBC: 3.21 MIL/uL — ABNORMAL LOW (ref 3.87–5.11)
RDW: 20.3 % — AB (ref 11.5–15.5)
WBC Morphology: INCREASED
WBC: 5.5 10*3/uL (ref 4.0–10.5)

## 2014-12-05 LAB — BASIC METABOLIC PANEL
Anion gap: 13 (ref 5–15)
BUN: 46 mg/dL — AB (ref 6–20)
CO2: 15 mmol/L — ABNORMAL LOW (ref 22–32)
CREATININE: 1.77 mg/dL — AB (ref 0.44–1.00)
Calcium: 6.5 mg/dL — ABNORMAL LOW (ref 8.9–10.3)
Chloride: 119 mmol/L — ABNORMAL HIGH (ref 101–111)
GFR calc Af Amer: 33 mL/min — ABNORMAL LOW (ref >60)
GFR, EST NON AFRICAN AMERICAN: 28 mL/min — AB (ref >60)
GLUCOSE: 101 mg/dL — AB (ref 65–99)
POTASSIUM: 3.1 mmol/L — AB (ref 3.5–5.1)
SODIUM: 147 mmol/L — AB (ref 135–145)

## 2014-12-05 LAB — HERPES SIMPLEX VIRUS(HSV) DNA BY PCR
HSV 1 DNA: NOT DETECTED
HSV 2 DNA: NOT DETECTED

## 2014-12-05 LAB — BLOOD GAS, ARTERIAL
Acid-Base Excess: 4.9 mmol/L — ABNORMAL HIGH (ref 0.0–2.0)
BICARBONATE: 28.6 meq/L — AB (ref 20.0–24.0)
Drawn by: 22223
FIO2: 60
LHR: 14 {breaths}/min
O2 Saturation: 98.7 %
PCO2 ART: 48.2 mmHg — AB (ref 35.0–45.0)
PEEP: 5 cmH2O
PO2 ART: 141 mmHg — AB (ref 80.0–100.0)
Patient temperature: 37
TCO2: 13.5 mmol/L (ref 0–100)
VT: 500 mL
pH, Arterial: 7.404 (ref 7.350–7.450)

## 2014-12-05 LAB — PHOSPHORUS: Phosphorus: 1 mg/dL — CL (ref 2.5–4.6)

## 2014-12-05 LAB — COMPREHENSIVE METABOLIC PANEL
ALBUMIN: 2.3 g/dL — AB (ref 3.5–5.0)
ALT: 22 U/L (ref 14–54)
AST: 61 U/L — AB (ref 15–41)
Alkaline Phosphatase: 98 U/L (ref 38–126)
Anion gap: 10 (ref 5–15)
BUN: 47 mg/dL — AB (ref 6–20)
CHLORIDE: 119 mmol/L — AB (ref 101–111)
CO2: 17 mmol/L — ABNORMAL LOW (ref 22–32)
CREATININE: 1.81 mg/dL — AB (ref 0.44–1.00)
Calcium: 6.4 mg/dL — CL (ref 8.9–10.3)
GFR calc Af Amer: 32 mL/min — ABNORMAL LOW (ref >60)
GFR, EST NON AFRICAN AMERICAN: 28 mL/min — AB (ref >60)
GLUCOSE: 129 mg/dL — AB (ref 65–99)
POTASSIUM: 2.8 mmol/L — AB (ref 3.5–5.1)
Sodium: 146 mmol/L — ABNORMAL HIGH (ref 135–145)
Total Bilirubin: 1.3 mg/dL — ABNORMAL HIGH (ref 0.3–1.2)
Total Protein: 4.8 g/dL — ABNORMAL LOW (ref 6.5–8.1)

## 2014-12-05 LAB — MAGNESIUM: MAGNESIUM: 1.3 mg/dL — AB (ref 1.7–2.4)

## 2014-12-05 LAB — TRIGLYCERIDES: Triglycerides: 430 mg/dL — ABNORMAL HIGH (ref <150)

## 2014-12-05 LAB — PREALBUMIN: PREALBUMIN: 17 mg/dL — AB (ref 18–38)

## 2014-12-05 LAB — VITAMIN B12: VITAMIN B 12: 1185 pg/mL — AB (ref 180–914)

## 2014-12-05 LAB — TROPONIN I: TROPONIN I: 0.28 ng/mL — AB (ref <0.031)

## 2014-12-05 LAB — TSH: TSH: 1.699 u[IU]/mL (ref 0.350–4.500)

## 2014-12-05 MED ORDER — SODIUM CHLORIDE 0.9 % IV SOLN
2.0000 g | Freq: Once | INTRAVENOUS | Status: AC
  Administered 2014-12-05: 2 g via INTRAVENOUS
  Filled 2014-12-05: qty 20

## 2014-12-05 MED ORDER — LEVETIRACETAM IN NACL 1000 MG/100ML IV SOLN
1000.0000 mg | Freq: Two times a day (BID) | INTRAVENOUS | Status: DC
  Administered 2014-12-05 – 2014-12-07 (×5): 1000 mg via INTRAVENOUS
  Filled 2014-12-05 (×7): qty 100

## 2014-12-05 MED ORDER — POTASSIUM PHOSPHATES 15 MMOLE/5ML IV SOLN
15.0000 mmol | Freq: Once | INTRAVENOUS | Status: AC
  Administered 2014-12-05: 15 mmol via INTRAVENOUS
  Filled 2014-12-05: qty 5

## 2014-12-05 MED ORDER — POTASSIUM CHLORIDE 10 MEQ/100ML IV SOLN
10.0000 meq | INTRAVENOUS | Status: AC
  Administered 2014-12-05 (×4): 10 meq via INTRAVENOUS
  Filled 2014-12-05 (×4): qty 100

## 2014-12-05 MED ORDER — MAGNESIUM SULFATE 2 GM/50ML IV SOLN
2.0000 g | Freq: Once | INTRAVENOUS | Status: AC
  Administered 2014-12-05: 2 g via INTRAVENOUS
  Filled 2014-12-05: qty 50

## 2014-12-05 MED ORDER — KCL IN DEXTROSE-NACL 40-5-0.45 MEQ/L-%-% IV SOLN
INTRAVENOUS | Status: DC
  Administered 2014-12-05 (×2): via INTRAVENOUS
  Administered 2014-12-06: 125 mL/h via INTRAVENOUS
  Administered 2014-12-06 – 2014-12-07 (×2): via INTRAVENOUS

## 2014-12-05 MED ORDER — INSULIN ASPART 100 UNIT/ML ~~LOC~~ SOLN
0.0000 [IU] | SUBCUTANEOUS | Status: DC
  Administered 2014-12-06: 1 [IU] via SUBCUTANEOUS
  Administered 2014-12-06 (×2): 2 [IU] via SUBCUTANEOUS
  Administered 2014-12-07: 1 [IU] via SUBCUTANEOUS
  Administered 2014-12-07: 2 [IU] via SUBCUTANEOUS
  Administered 2014-12-07: 3 [IU] via SUBCUTANEOUS
  Administered 2014-12-08 (×3): 1 [IU] via SUBCUTANEOUS

## 2014-12-05 MED ORDER — SODIUM CHLORIDE 0.9 % IV BOLUS (SEPSIS)
500.0000 mL | Freq: Once | INTRAVENOUS | Status: AC
  Administered 2014-12-05: 500 mL via INTRAVENOUS

## 2014-12-05 MED ORDER — M.V.I. ADULT IV INJ
INJECTION | INTRAVENOUS | Status: AC
  Administered 2014-12-05: 17:00:00 via INTRAVENOUS
  Filled 2014-12-05: qty 480

## 2014-12-05 NOTE — Progress Notes (Signed)
[facility] Progress Note Patient Name: Catherine Mccullough DOB: December 13, 1945 MRN: [id number]   Date of Service  12/05/2014  HPI/Events of Note  Remains tachy -neuro consult noted, concern for seizure NAG acidosis  - high Cl  eICU Interventions  Dc bicarb gtt -await rpt BMET Keppra started, may need EEG Can transfer to [facility] if need for cEEG  Would hold off TNA  - hopefully can insert NG after 2 ds (MW tear) & use enteral feeds     Intervention Category Intermediate Interventions: Diagnostic test evaluation;Electrolyte abnormality - evaluation and management  ALVA,RAKESH V. 12/05/2014, 12:43 AM

## 2014-12-05 NOTE — Care Management Note (Signed)
Case Management Note  Patient Details  Name: Catherine Mccullough MRN: [id number] Date of Birth: [birth year]  Subjective/Objective:                  Pt currently intubated and unable to communicate. Ino given by pt's son [name]) who is at the bedside. Pt is from home, lives alone and is ind with ADL's. Pt has cane/walker that she uses PRN. Pt has used [facility] in the past and was recently DC'd from their services in October. Pt's son says him and his sister do not feel their mom is safe living alone, they are unable to check in on her daily and she frequently gets sick, developes seizures and is locked in her home. Family would like for pt to be placed at DC. CM explained to family that if pt is able to make her own decisions it will be up to her.   Action/Plan: Pt will need PT eval when appropriate. Anticipate pt will wish to DC home with [name] services. Will cont to follow for DC planning.   Expected Discharge Date:  12/07/14               Expected Discharge Plan:  [name]  In-House Referral:  NA  Discharge planning Services  CM Consult  Post Acute Care Choice:  Home Health Choice offered to:  Patient  DME Arranged:    DME Agency:     [name]:    [name] Agency:     Status of Service:  In process, will continue to follow  Medicare Important Message Given:    Date Medicare IM Given:    Medicare IM give by:    Date Additional Medicare IM Given:    Additional Medicare Important Message give by:     If discussed at [place] of Stay Meetings, dates discussed:    Additional Comments:  Sherald Barge, RN 12/05/2014, 1:28 PM

## 2014-12-05 NOTE — Progress Notes (Addendum)
Patient ID: Catherine Mccullough, female   DOB: 30-Aug-1945, 69 y.o.   MRN: [id number]  Langlade A. [name], MD     [email or web address]          Catherine Mccullough is an 69 y.o. female.   Assessment/Plan: Acute severe encephalopathy of unclear etiology but associated with very high CSF protein, seizures and hypothermia. CSF analysis and other testing does not support diagnosis of infection at this time. The differential diagnosis for very high CSF protein includes bacterial related meningitis, TB meningitis, brain tumors, intracranial trauma and intracranial hemorrhage. Given the seizures, I think this is the most likely etiology however. 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  RECOMMENDATION: The patient will be started on high dose Keppra IV. Imaging of the brain with IV contrast once the patient is stable. MRI is preferable. Additional blood test for the following: HIV, RPR, vitamin B12 and thyroid function tests. Avoid sedatives.      The nursing staff reports that the patient has improved cognitively. Acid fast bacillus was not able to be done because of insufficient spinal fluid.  Still intubated   HEENT: Supple. Atraumatic normocephalic.   ABDOMEN: soft  EXTREMITIES: No edema   BACK: Normal.  SKIN: Normal by inspection.   MENTAL STATUS: She is awake and alert. She does follow midline and appendicular commands. She tries to talk.  CRANIAL NERVES: The pupils are deviated to the left and downward;  upper and lower facial muscles are normal in strength and symmetric, there is no flattening of the nasolabial folds. Cough and gag reflexes are intact. She has full extraocular movements. No nystagmus is appreciated.  MOTOR: Bulk and tone are normal throughout.   She has antigravity strength of the upper extremities.  COORDINATION: No tremors or dysmetria is appreciated.  REFLEXES: Deep tendon reflexes are symmetrical and normal. Babinski reflexes are flexor on the right but upgoing on the left  SENSATION: She responds to deep painful stimuli bilaterally   EEG shows moderate to severe global slowing. No epileptiform activities.   Objective: Vital signs in last 24 hours: Temp:  [98.6 F (37 C)-101.1 F (38.4 C)] 99 F (37.2 C) (11/29 1715) Pulse Rate:  [32-150] 110 (11/29 1715) Resp:  [5-30] 6 (11/29 1715) BP: (71-156)/(43-116) 100/61 mmHg (11/29 1700) SpO2:  [86 %-100 %] 100 % (11/29 1715) FiO2 (%):  [35 %] 35 % (11/29 1602) Weight:  [52.1 kg (114 lb 13.8 oz)] 52.1 kg (114 lb 13.8 oz) (11/29 0400)  Intake/Output from previous day: 11/28 0701 - 11/29 0700 In: 6096.2 [I.V.:2486.2; Blood:700; IV Piggyback:2910] Out: 901 [Urine:900; Stool:1] Intake/Output this shift:   Nutritional status: Diet NPO time specified .TPN (CLINIMIX-E) Adult   Lab Results: Results for orders placed or performed during the hospital encounter of 12/03/14 (from the past 48 hour(s))  Blood gas, arterial     Status: Abnormal   Collection Time: 12/03/14  7:11 PM  Result Value Ref Range   FIO2 0.21    O2 Content 21.0 L/min   Delivery systems ROOM AIR    pH, Arterial 7.036 (LL) 7.350 - 7.450    Comment: CRITICAL RESULT CALLED TO, READ BACK BY AND VERIFIED WITH: PENNY WATKINS,RN 0N 12/03/2014 BY PEVIANY LAWSON,RRT AT 1927    pCO2 arterial 12.1 (LL) 35.0 - 45.0 mmHg    Comment: CRITICAL RESULT  CALLED TO, READ BACK BY AND VERIFIED WITH: PENNY WATKINS,RN ON 12/03/2014 BY PEVIANY LAWSON,RRT AT 1927    pO2, Arterial 129 (H) 80.0 - 100.0 mmHg   Bicarbonate 5.9 (L) 20.0 - 24.0 mEq/L   Acid-base deficit 26.0 (H) 0.0 - 2.0 mmol/L   O2 Saturation 97.4 %   Collection site BRACHIAL ARTERY    Drawn by [phone]    Sample type ARTERIAL    Allens test (pass/fail) NOT  INDICATED (A) PASS  Type and screen [facility]     Status: None (Preliminary result)   Collection Time: 12/03/14  9:05 PM  Result Value Ref Range   ABO/RH(D) A POS    Antibody Screen NEG    Sample Expiration 12/06/2014    Unit Number [id number]    Blood Component Type RED CELLS,LR    Unit division 00    Status of Unit ISSUED    Transfusion Status OK TO TRANSFUSE    Crossmatch Result Compatible    Unit Number [id number]    Blood Component Type RED CELLS,LR    Unit division 00    Status of Unit ISSUED,FINAL    Transfusion Status OK TO TRANSFUSE    Crossmatch Result Compatible    Unit Number [id number]    Blood Component Type RED CELLS,LR    Unit division 00    Status of Unit ALLOCATED    Transfusion Status OK TO TRANSFUSE    Crossmatch Result Compatible   Prepare RBC     Status: None   Collection Time: 12/03/14  9:05 PM  Result Value Ref Range   Order Confirmation ORDER PROCESSED BY BLOOD BANK   Protime-INR     Status: Abnormal   Collection Time: 12/03/14  9:05 PM  Result Value Ref Range   Prothrombin Time 18.5 (H) 11.6 - 15.2 seconds   INR 1.54 (H) 0.00 - 1.49  APTT     Status: Abnormal   Collection Time: 12/03/14  9:05 PM  Result Value Ref Range   aPTT 47 (H) 24 - 37 seconds    Comment:        IF BASELINE aPTT IS ELEVATED, SUGGEST PATIENT RISK ASSESSMENT BE USED TO DETERMINE APPROPRIATE ANTICOAGULANT THERAPY.   Blood gas, arterial     Status: Abnormal   Collection Time: 12/03/14 10:50 PM  Result Value Ref Range   FIO2 0.60    Delivery systems PRESSURE REGULATED VOLUME CONTROL    Mode VENTILATOR    VT 500 mL   LHR 18 resp/min   Peep/cpap 5.0 cm H20   pH, Arterial 7.208 (L) 7.350 - 7.450   pCO2 arterial 21.9 (L) 35.0 - 45.0 mmHg   pO2, Arterial 298.0 (H) 80.0 - 100.0 mmHg   Bicarbonate 10.8 (L) 20.0 - 24.0 mEq/L   Acid-base deficit 18.1 (H) 0.0 - 2.0 mmol/L   O2 Saturation 99.7 %   Patient temperature 37.6    Collection site BRACHIAL ARTERY     Drawn by [id number]    Sample type ARTERIAL    Allens test (pass/fail) NOT INDICATED (A) PASS  CBC with Differential     Status: Abnormal   Collection Time: 12/04/14  1:15 AM  Result Value Ref Range   WBC 2.1 (L) 4.0 - 10.5 K/uL   RBC 2.36 (L) 3.87 - 5.11 MIL/uL   Hemoglobin 8.3 (L) 12.0 - 15.0 g/dL    Comment: DELTA CHECK NOTED   HCT 23.7 (L) 36.0 - 46.0 %   MCV 100.4 (H) 78.0 - 100.0  fL   MCH 35.2 (H) 26.0 - 34.0 pg   MCHC 35.0 30.0 - 36.0 g/dL   RDW 20.9 (H) 11.5 - 15.5 %   Platelets 102 (L) 150 - 400 K/uL    Comment: SPECIMEN CHECKED FOR CLOTS   Neutrophils Relative % 90 %   Lymphocytes Relative 5 %   Monocytes Relative 3 %   Eosinophils Relative 0 %   Basophils Relative 0 %   Band Neutrophils 2 %   Metamyelocytes Relative 0 %   Myelocytes 0 %   Promyelocytes Absolute 0 %   Blasts 0 %   nRBC 6 (H) 0 /100 WBC   Other 0 %   Neutro Abs 1.9 1.7 - 7.7 K/uL   Lymphs Abs 0.1 (L) 0.7 - 4.0 K/uL   Monocytes Absolute 0.1 0.1 - 1.0 K/uL   Eosinophils Absolute 0.0 0.0 - 0.7 K/uL   Basophils Absolute 0.0 0.0 - 0.1 K/uL   RBC Morphology RARE NRBCs   Comprehensive metabolic panel     Status: Abnormal   Collection Time: 12/04/14  1:15 AM  Result Value Ref Range   Sodium 149 (H) 135 - 145 mmol/L   Potassium 2.4 (LL) 3.5 - 5.1 mmol/L    Comment: RESULT REPEATED AND VERIFIED CRITICAL RESULT CALLED TO, READ BACK BY AND VERIFIED WITH:  NEILSON,T @ 0340 ON 12/04/14 BY WOODIE,J    Chloride 128 (H) 101 - 111 mmol/L   CO2 10 (L) 22 - 32 mmol/L   Glucose, Bld 126 (H) 65 - 99 mg/dL   BUN 56 (H) 6 - 20 mg/dL   Creatinine, Ser 2.06 (H) 0.44 - 1.00 mg/dL   Calcium 7.3 (L) 8.9 - 10.3 mg/dL    Comment: DELTA CHECK NOTED   Total Protein 5.3 (L) 6.5 - 8.1 g/dL   Albumin 2.7 (L) 3.5 - 5.0 g/dL   AST 45 (H) 15 - 41 U/L   ALT 18 14 - 54 U/L   Alkaline Phosphatase 85 38 - 126 U/L   Total Bilirubin 0.8 0.3 - 1.2 mg/dL   GFR calc non Af Amer 24 (L) >60 mL/min   GFR calc Af Amer 27 (L) >60 mL/min     Comment: (NOTE) The eGFR has been calculated using the CKD EPI equation. This calculation has not been validated in all clinical situations. eGFR's persistently <60 mL/min signify possible Chronic Kidney Disease.    Anion gap 11 5 - 15  Lactic acid, plasma     Status: None   Collection Time: 12/04/14  1:15 AM  Result Value Ref Range   Lactic Acid, Venous 1.5 0.5 - 2.0 mmol/L  Procalcitonin     Status: None   Collection Time: 12/04/14  1:15 AM  Result Value Ref Range   Procalcitonin 15.48 ng/mL    Comment:        Interpretation: PCT >= 10 ng/mL: Important systemic inflammatory response, almost exclusively due to severe bacterial sepsis or septic shock. (NOTE)         ICU PCT Algorithm               Non ICU PCT Algorithm    ----------------------------     ------------------------------         PCT < 0.25 ng/mL                 PCT < 0.1 ng/mL     Stopping of antibiotics            Stopping of antibiotics  strongly encouraged.               strongly encouraged.    ----------------------------     ------------------------------       PCT level decrease by               PCT < 0.25 ng/mL       >= 80% from peak PCT       OR PCT 0.25 - 0.5 ng/mL          Stopping of antibiotics                                             encouraged.     Stopping of antibiotics           encouraged.    ----------------------------     ------------------------------       PCT level decrease by              PCT >= 0.25 ng/mL       < 80% from peak PCT        AND PCT >= 0.5 ng/mL             Continuing antibiotics                                              encouraged.       Continuing antibiotics            encouraged.    ----------------------------     ------------------------------     PCT level increase compared          PCT > 0.5 ng/mL         with peak PCT AND          PCT >= 0.5 ng/mL             Escalation of antibiotics                                          strongly  encouraged.      Escalation of antibiotics        strongly encouraged.   TSH     Status: None   Collection Time: 12/04/14  1:15 AM  Result Value Ref Range   TSH 2.745 0.350 - 4.500 uIU/mL  MRSA PCR Screening     Status: Abnormal   Collection Time: 12/04/14  2:31 AM  Result Value Ref Range   MRSA by PCR POSITIVE (A) NEGATIVE    Comment:        The GeneXpert MRSA Assay (FDA approved for NASAL specimens only), is one component of a comprehensive MRSA colonization surveillance program. It is not intended to diagnose MRSA infection nor to guide or monitor treatment for MRSA infections. RESULT CALLED TO, READ BACK BY AND VERIFIED WITH: Carron Curie ON [id number] AT 0610 BY RESSEGGER R   Magnesium     Status: None   Collection Time: 12/04/14  4:50 AM  Result Value Ref Range   Magnesium 1.9 1.7 - 2.4 mg/dL  Blood gas, arterial     Status: Abnormal   Collection Time: 12/04/14  5:59 AM  Result  Value Ref Range   FIO2 35.00    Delivery systems VENTILATOR    Mode PRESSURE REGULATED VOLUME CONTROL    VT 500 mL   LHR 20 resp/min   Peep/cpap 5.0 cm H20   pH, Arterial 7.443 7.350 - 7.450   pCO2 arterial 15.6 (LL) 35.0 - 45.0 mmHg    Comment: CRITICAL RESULT CALLED TO, READ BACK BY AND VERIFIED WITH:  TRACEY NEILSON RN BY K KNICK RRT RCP ON 12/04/2014 AT 0608    pO2, Arterial 178 (H) 80.0 - 100.0 mmHg   Bicarbonate 14.7 (L) 20.0 - 24.0 mEq/L   TCO2 11.6 0 - 100 mmol/L   Acid-Base Excess 13.1 (H) 0.0 - 2.0 mmol/L   O2 Saturation 99.7 %   Patient temperature 37.0    Collection site RIGHT BRACHIAL    Drawn by [id number]    Sample type ARTERIAL    Allens test (pass/fail) PASS PASS  Basic metabolic panel     Status: Abnormal   Collection Time: 12/04/14 10:25 AM  Result Value Ref Range   Sodium 148 (H) 135 - 145 mmol/L   Potassium 2.7 (LL) 3.5 - 5.1 mmol/L    Comment: CRITICAL RESULT CALLED TO, READ BACK BY AND VERIFIED WITH: ROWE,N AT 11:05AM ON 12/04/14 BY FESTERMAN,C    Chloride 125  (H) 101 - 111 mmol/L   CO2 13 (L) 22 - 32 mmol/L   Glucose, Bld 139 (H) 65 - 99 mg/dL   BUN 59 (H) 6 - 20 mg/dL   Creatinine, Ser 2.13 (H) 0.44 - 1.00 mg/dL   Calcium 7.1 (L) 8.9 - 10.3 mg/dL   GFR calc non Af Amer 23 (L) >60 mL/min   GFR calc Af Amer 26 (L) >60 mL/min    Comment: (NOTE) The eGFR has been calculated using the CKD EPI equation. This calculation has not been validated in all clinical situations. eGFR's persistently <60 mL/min signify possible Chronic Kidney Disease.    Anion gap 10 5 - 15  CBC     Status: Abnormal   Collection Time: 12/04/14 10:25 AM  Result Value Ref Range   WBC 2.7 (L) 4.0 - 10.5 K/uL   RBC 2.44 (L) 3.87 - 5.11 MIL/uL   Hemoglobin 8.5 (L) 12.0 - 15.0 g/dL   HCT 24.1 (L) 36.0 - 46.0 %   MCV 98.8 78.0 - 100.0 fL   MCH 34.8 (H) 26.0 - 34.0 pg   MCHC 35.3 30.0 - 36.0 g/dL   RDW 21.3 (H) 11.5 - 15.5 %   Platelets 86 (L) 150 - 400 K/uL    Comment: SPECIMEN CHECKED FOR CLOTS PLATELET COUNT CONFIRMED BY SMEAR   Protime-INR     Status: Abnormal   Collection Time: 12/04/14 10:25 AM  Result Value Ref Range   Prothrombin Time 19.4 (H) 11.6 - 15.2 seconds   INR 1.63 (H) 0.00 - 1.49  APTT     Status: Abnormal   Collection Time: 12/04/14 10:25 AM  Result Value Ref Range   aPTT 55 (H) 24 - 37 seconds    Comment:        IF BASELINE aPTT IS ELEVATED, SUGGEST PATIENT RISK ASSESSMENT BE USED TO DETERMINE APPROPRIATE ANTICOAGULANT THERAPY.   Prepare RBC     Status: None   Collection Time: 12/04/14 10:42 AM  Result Value Ref Range   Order Confirmation ORDER PROCESSED BY BLOOD BANK   Comprehensive metabolic panel     Status: Abnormal   Collection Time: 12/05/14  4:30 AM  Result Value Ref Range   Sodium 146 (H) 135 - 145 mmol/L   Potassium 2.8 (L) 3.5 - 5.1 mmol/L   Chloride 119 (H) 101 - 111 mmol/L   CO2 17 (L) 22 - 32 mmol/L   Glucose, Bld 129 (H) 65 - 99 mg/dL   BUN 47 (H) 6 - 20 mg/dL   Creatinine, Ser 1.81 (H) 0.44 - 1.00 mg/dL   Calcium 6.4  (LL) 8.9 - 10.3 mg/dL    Comment: CRITICAL RESULT CALLED TO, READ BACK BY AND VERIFIED WITH: HAMMOCK,S AT 6:00AM ON 12/05/14 BY FESTERMAN,C    Total Protein 4.8 (L) 6.5 - 8.1 g/dL   Albumin 2.3 (L) 3.5 - 5.0 g/dL   AST 61 (H) 15 - 41 U/L   ALT 22 14 - 54 U/L   Alkaline Phosphatase 98 38 - 126 U/L   Total Bilirubin 1.3 (H) 0.3 - 1.2 mg/dL   GFR calc non Af Amer 28 (L) >60 mL/min   GFR calc Af Amer 32 (L) >60 mL/min    Comment: (NOTE) The eGFR has been calculated using the CKD EPI equation. This calculation has not been validated in all clinical situations. eGFR's persistently <60 mL/min signify possible Chronic Kidney Disease.    Anion gap 10 5 - 15  Magnesium     Status: Abnormal   Collection Time: 12/05/14  4:30 AM  Result Value Ref Range   Magnesium 1.3 (L) 1.7 - 2.4 mg/dL  Phosphorus     Status: Abnormal   Collection Time: 12/05/14  4:30 AM  Result Value Ref Range   Phosphorus 1.0 (LL) 2.5 - 4.6 mg/dL    Comment: CRITICAL RESULT CALLED TO, READ BACK BY AND VERIFIED WITH: HAMMOCK,S AT 6:00AM ON 12/05/14 BY FESTERMAN,C   Triglycerides     Status: Abnormal   Collection Time: 12/05/14  4:30 AM  Result Value Ref Range   Triglycerides 430 (H) <150 mg/dL  Prealbumin     Status: Abnormal   Collection Time: 12/05/14  4:30 AM  Result Value Ref Range   Prealbumin 17.0 (L) 18 - 38 mg/dL    Comment: Performed at [facility]  Vitamin B12     Status: Abnormal   Collection Time: 12/05/14  4:30 AM  Result Value Ref Range   Vitamin B-12 1185 (H) 180 - 914 pg/mL    Comment: (NOTE) This assay is not validated for testing neonatal or myeloproliferative syndrome specimens for Vitamin B12 levels. Performed at [facility]   TSH     Status: None   Collection Time: 12/05/14  4:30 AM  Result Value Ref Range   TSH 1.699 0.350 - 4.500 uIU/mL  CBC with Differential/Platelet     Status: Abnormal   Collection Time: 12/05/14  4:30 AM  Result Value Ref Range   WBC 5.5 4.0 -  10.5 K/uL   RBC 3.21 (L) 3.87 - 5.11 MIL/uL   Hemoglobin 10.2 (L) 12.0 - 15.0 g/dL   HCT 29.1 (L) 36.0 - 46.0 %   MCV 90.7 78.0 - 100.0 fL    Comment: DELTA CHECK NOTED   MCH 31.8 26.0 - 34.0 pg   MCHC 35.1 30.0 - 36.0 g/dL   RDW 20.3 (H) 11.5 - 15.5 %   Platelets 47 (L) 150 - 400 K/uL   Neutrophils Relative % 87 %   Neutro Abs 4.8 1.7 - 7.7 K/uL   Lymphocytes Relative 7 %   Lymphs Abs 0.4 (L) 0.7 - 4.0 K/uL   Monocytes  Relative 4 %   Monocytes Absolute 0.2 0.1 - 1.0 K/uL   Eosinophils Relative 2 %   Eosinophils Absolute 0.1 0.0 - 0.7 K/uL   Basophils Relative 0 %   Basophils Absolute 0.0 0.0 - 0.1 K/uL   WBC Morphology INCREASED BANDS (>20% BANDS)   Blood gas, arterial     Status: Abnormal   Collection Time: 12/05/14  5:40 AM  Result Value Ref Range   FIO2 60.00    Delivery systems VENTILATOR    Mode PRESSURE REGULATED VOLUME CONTROL    VT 500 mL   LHR 14 resp/min   Peep/cpap 5.0 cm H20   pH, Arterial 7.404 7.350 - 7.450   pCO2 arterial 48.2 (H) 35.0 - 45.0 mmHg   pO2, Arterial 141.0 (H) 80.0 - 100.0 mmHg   Bicarbonate 28.6 (H) 20.0 - 24.0 mEq/L   TCO2 13.5 0 - 100 mmol/L   Acid-Base Excess 4.9 (H) 0.0 - 2.0 mmol/L   O2 Saturation 98.7 %   Patient temperature 37.0    Collection site RIGHT RADIAL    Drawn by [id number]    Sample type ARTERIAL    Allens test (pass/fail) PASS PASS  Basic metabolic panel     Status: Abnormal   Collection Time: 12/05/14 10:34 AM  Result Value Ref Range   Sodium 147 (H) 135 - 145 mmol/L   Potassium 3.1 (L) 3.5 - 5.1 mmol/L   Chloride 119 (H) 101 - 111 mmol/L   CO2 15 (L) 22 - 32 mmol/L   Glucose, Bld 101 (H) 65 - 99 mg/dL   BUN 46 (H) 6 - 20 mg/dL   Creatinine, Ser 1.77 (H) 0.44 - 1.00 mg/dL   Calcium 6.5 (L) 8.9 - 10.3 mg/dL   GFR calc non Af Amer 28 (L) >60 mL/min   GFR calc Af Amer 33 (L) >60 mL/min    Comment: (NOTE) The eGFR has been calculated using the CKD EPI equation. This calculation has not been validated in all clinical  situations. eGFR's persistently <60 mL/min signify possible Chronic Kidney Disease.    Anion gap 13 5 - 15  Troponin I     Status: Abnormal   Collection Time: 12/05/14 10:34 AM  Result Value Ref Range   Troponin I 0.28 (H) <0.031 ng/mL    Comment:        PERSISTENTLY INCREASED TROPONIN VALUES IN THE RANGE OF 0.04-0.49 ng/mL CAN BE SEEN IN:       -UNSTABLE ANGINA       -CONGESTIVE HEART FAILURE       -MYOCARDITIS       -CHEST TRAUMA       -ARRYHTHMIAS       -LATE PRESENTING MYOCARDIAL INFARCTION       -COPD   CLINICAL FOLLOW-UP RECOMMENDED.   Glucose, capillary     Status: None   Collection Time: 12/05/14 10:56 AM  Result Value Ref Range   Glucose-Capillary 85 65 - 99 mg/dL   Comment 1 Repeat Test   Glucose, capillary     Status: Abnormal   Collection Time: 12/05/14  4:54 PM  Result Value Ref Range   Glucose-Capillary 115 (H) 65 - 99 mg/dL   Comment 1 Document in Chart     Lipid Panel  Recent Labs  12/05/14 0430  TRIG 430*    Studies/Results:   Medications:  Scheduled Meds: . sodium chloride   Intravenous Once  . antiseptic oral rinse  7 mL Mouth Rinse QID  . aztreonam  1 g Intravenous Q8H  . chlorhexidine gluconate  15 mL Mouth Rinse BID  . Chlorhexidine Gluconate Cloth  6 each Topical Q0600  . insulin aspart  0-9 Units Subcutaneous 6 times per day  . levETIRAcetam  1,000 mg Intravenous Q12H  . levofloxacin (LEVAQUIN) IV  750 mg Intravenous Q48H  . linezolid (ZYVOX) IV  600 mg Intravenous Q12H  . mupirocin ointment  1 application Nasal BID  . pantoprazole (PROTONIX) IV  40 mg Intravenous Q12H  . sodium chloride  3 mL Intravenous Q12H   Continuous Infusions: . Marland KitchenTPN (CLINIMIX-E) Adult 20 mL/hr at 12/05/14 1644  . dextrose 5 % and 0.45 % NaCl with KCl 40 mEq/L 125 mL/hr at 12/05/14 1100   PRN Meds:.fentaNYL (SUBLIMAZE) injection, fentaNYL (SUBLIMAZE) injection, midazolam, midazolam     LOS: 2 days   Adelyne Marchese A. [name], M.D.  Diplomate, Tax adviser  of Psychiatry and Neurology ( Neurology).

## 2014-12-05 NOTE — Progress Notes (Signed)
Subjective: No further overt GI bleeding. Eyes open but non-verbal.   Objective: Vital signs in last 24 hours: Temp:  [98.6 F (37 C)-101.3 F (38.5 C)] 99.9 F (37.7 C) (11/29 0645) Pulse Rate:  [32-158] 129 (11/29 0645) Resp:  [5-35] 18 (11/29 0645) BP: (71-156)/(43-116) 112/64 mmHg (11/29 0630) SpO2:  [86 %-100 %] 100 % (11/29 0645) FiO2 (%):  [35 %] 35 % (11/29 0344) Weight:  [114 lb 13.8 oz (52.1 kg)] 114 lb 13.8 oz (52.1 kg) (11/29 0400) Last BM Date: 12/04/14 General:   Alert, unable to assess orientation.  Abdomen:  Bowel sounds present, soft, winces with palpation but no rebound or guarding Neurologic:  Alert, unable to assess orientation   Intake/Output from previous day: 11/28 0701 - 11/29 0700 In: 6096.2 [I.V.:2486.2; Blood:700; IV Piggyback:2910] Out: 901 [Urine:900; Stool:1] Intake/Output this shift:    Lab Results:  Recent Labs  12/04/14 0115 12/04/14 1025 12/05/14 0430  WBC 2.1* 2.7* 5.5  HGB 8.3* 8.5* 10.2*  HCT 23.7* 24.1* 29.1*  PLT 102* 86* 47*   BMET  Recent Labs  12/04/14 0115 12/04/14 1025 12/05/14 0430  NA 149* 148* 146*  K 2.4* 2.7* 2.8*  CL 128* 125* 119*  CO2 10* 13* 17*  GLUCOSE 126* 139* 129*  BUN 56* 59* 47*  CREATININE 2.06* 2.13* 1.81*  CALCIUM 7.3* 7.1* 6.4*   LFT  Recent Labs  12/03/14 1100 12/04/14 0115 12/05/14 0430  PROT 8.3* 5.3* 4.8*  ALBUMIN 4.5 2.7* 2.3*  AST 40 45* 61*  ALT 20 18 22   ALKPHOS 107 85 98  BILITOT 0.8 0.8 1.3*   PT/INR  Recent Labs  12/03/14 2105 12/04/14 1025  LABPROT 18.5* 19.4*  INR 1.54* 1.63*    Studies/Results: Ct Head Wo Contrast  12/03/2014  CLINICAL DATA:  69 year old female found down by family. Uncertain how long patient has been down (last contact with family members was 3 days ago). EXAM: CT HEAD WITHOUT CONTRAST TECHNIQUE: Contiguous axial images were obtained from the base of the skull through the vertex without intravenous contrast. COMPARISON:  Head CT  04/26/2014. FINDINGS: Low-attenuation in the inferior aspect of the right frontal lobe, mild low-attenuation in the inferior aspect of the medial left frontal lobe, and low-attenuation in the anterior aspect of the right temporal lobe, all similar to the prior examination, likely to reflect gliosis from remote trauma. Physiologic calcifications in the basal ganglia bilaterally. No acute intracranial abnormalities. Specifically, no evidence of acute intracranial hemorrhage, no definite findings of acute/subacute cerebral ischemia, no mass, mass effect, hydrocephalus or abnormal intra or extra-axial fluid collections. Visualized paranasal sinuses and mastoids are well pneumatized. No acute displaced skull fractures are identified. IMPRESSION: 1. No acute intracranial abnormalities. 2. Areas of probable gliosis in the frontal lobes bilaterally (right greater than left) and anterior aspect of the right temporal lobe, unchanged compared to the prior study, likely related to remote trauma. Electronically Signed   By: Vinnie Langton M.D.   On: 12/03/2014 13:07   Dg Chest Port 1 View  12/05/2014  CLINICAL DATA:  Hypoxia EXAM: PORTABLE CHEST 1 VIEW COMPARISON:  December 04, 2014 FINDINGS: Endotracheal tube tip is 2.1 cm above the carina. No pneumothorax. There is a minimal right pleural effusion. There is slight bibasilar atelectasis. Lungs elsewhere clear. Heart size and pulmonary vascularity are normal. No adenopathy. A surgical clip is again noted at the level of the aortopulmonary window. IMPRESSION: Endotracheal tube as described without pneumothorax. Slight bibasilar atelectasis. Minimal right pleural  effusion. No airspace consolidation appreciable. Electronically Signed   By: Lowella Grip III M.D.   On: 12/05/2014 08:05   Dg Chest Port 1 View  12/04/2014  CLINICAL DATA:  Sepsis EXAM: PORTABLE CHEST 1 VIEW COMPARISON:  12/03/2014 FINDINGS: Endotracheal tube tip is 3.5 cm above the carina. There is  improvement, with partial clearance of basilar opacities, now with better definition of the diaphragmatic contours. No pneumothorax. IMPRESSION: Satisfactory ET tube position. Partial clearance of basilar opacities. Electronically Signed   By: Andreas Newport M.D.   On: 12/04/2014 06:43   Dg Chest Portable 1 View  12/03/2014  CLINICAL DATA:  Intubation. EXAM: PORTABLE CHEST 1 VIEW COMPARISON:  12/03/2014 FINDINGS: The endotracheal tube tip is 1.8 cm above the carina. Streaky opacities persist in both bases. No large effusions. No pneumothorax. IMPRESSION: Satisfactory ET tube position. No other significant interval change. Electronically Signed   By: Andreas Newport M.D.   On: 12/03/2014 21:26   Dg Chest Port 1 View  12/03/2014  CLINICAL DATA:  Follow-up right subclavian central line attempt EXAM: PORTABLE CHEST 1 VIEW COMPARISON:  Prior film same day FINDINGS: Cardiomediastinal silhouette is stable. Persistent streaky right basilar atelectasis or infiltrate. There is no pneumothorax. Degenerative changes right shoulder. No pulmonary edema. Moderate gaseous distension of the stomach. IMPRESSION: Persistent streaky right basilar atelectasis or infiltrate. No pneumothorax. Degenerative changes right shoulder. Electronically Signed   By: Lahoma Crocker M.D.   On: 12/03/2014 15:58   Dg Chest Port 1 View  12/03/2014  CLINICAL DATA:  Found unresponsive on the floor, history of breast cancer, hyponatremia EXAM: PORTABLE CHEST 1 VIEW COMPARISON:  08/21/2014 FINDINGS: Cardiomediastinal silhouette is unremarkable. There is streaky right basilar atelectasis or infiltrate. Moderate gaseous distension of the stomach. No pulmonary edema. IMPRESSION: Streaky right basilar atelectasis or infiltrate. Aspiration cannot be excluded. Moderate gastric distension of the stomach. No pulmonary edema. Electronically Signed   By: Lahoma Crocker M.D.   On: 12/03/2014 13:34    Assessment: 69 year old female admitted with mental  status changes, hypothermia, hypotension, episode of hematemesis with bedside EGD noting MW tear s/p clips X 2, non-erosive gastritis. Hgb 13 on admission and down to 8.3 yesterday but improvement after 2 units PRBCs. Without any further overt GI bleeding. Guarded prognosis. From a GI standpoint, nothing further to offer. Mildly elevated AST, non-specific.   Will sign off and please reconsult if necessary.     Plan: Protonix 40 IV BID No NG tube for 2 more days Supportive care Signing off  Orvil Feil, ANP-BC [facility]    LOS: 2 days    12/05/2014, 8:19 AM

## 2014-12-05 NOTE — Progress Notes (Signed)
Pharmacy TPN Electrolytes noted this am.  Will order 2 gm mag IV, 4 runs KCl and 15 mmol KPhos.  Corr Ca 7.9 More detailed note to follow Excell Seltzer, PharmD

## 2014-12-05 NOTE — Progress Notes (Signed)
CRITICAL VALUE ALERT  Critical value received:  Ca 6.4 and Phos 1.0  Date of notification:  12/05/14  Time of notification:  0603  Critical value read back: yes  Nurse who received alert:  Candiss Norse RN  MD notified (1st page):  Dr. Elsworth Soho  Time of first page:  0605  MD notified (2nd page): Dr. Elsworth Soho  Time of second page: [phone]  Responding MD:  Margaree Mackintosh MD reviewing for next step  Time MD responded: [phone]

## 2014-12-05 NOTE — Clinical Documentation Improvement (Signed)
Hospitalist  Can the diagnosis of Respiratory Failure be further specified?   Document Acuity - Acute, Chronic, Acute on Chronic  Document Inclusion Of - Hypoxia, Hypercapnia, Combination of Both  Other  Clinically Undetermined  Document any associated diagnoses/conditions.   Supporting Information: Patient with respiratory failure, remains on ventilator per 11/28 progress notes.   Please exercise your independent, professional judgment when responding. A specific answer is not anticipated or expected.   Thank You,  [place] [phone]

## 2014-12-05 NOTE — Progress Notes (Signed)
Subjective: She remains intubated and on the ventilator. I was concerned that she would not survive the day yesterday. She is unresponsive. She has been moving around in the bed.  Objective: Vital signs in last 24 hours: Temp:  [98.6 F (37 C)-101.3 F (38.5 C)] 99.9 F (37.7 C) (11/29 0645) Pulse Rate:  [32-158] 129 (11/29 0645) Resp:  [5-35] 18 (11/29 0645) BP: (71-156)/(43-116) 112/64 mmHg (11/29 0630) SpO2:  [86 %-100 %] 100 % (11/29 0645) FiO2 (%):  [35 %] 35 % (11/29 0816) Weight:  [52.1 kg (114 lb 13.8 oz)] 52.1 kg (114 lb 13.8 oz) (11/29 0400) Weight change: -4.6 kg (-10 lb 2.3 oz) Last BM Date: 12/04/14  Intake/Output from previous day: 11/28 0701 - 11/29 0700 In: 6096.2 [I.V.:2486.2; Blood:700; IV Piggyback:2910] Out: 901 [Urine:900; Stool:1]  PHYSICAL EXAM General appearance: Intubated sedated on mechanical ventilation Resp: rhonchi bilaterally Cardio: regular rate and rhythm, S1, S2 normal, no murmur, click, rub or gallop GI: soft, non-tender; bowel sounds normal; no masses,  no organomegaly Extremities: extremities normal, atraumatic, no cyanosis or edema  Lab Results:  Results for orders placed or performed during the hospital encounter of 12/03/14 (from the past 48 hour(s))  CBG monitoring, ED     Status: None   Collection Time: 12/03/14 10:42 AM  Result Value Ref Range   Glucose-Capillary 97 65 - 99 mg/dL  Ammonia     Status: Abnormal   Collection Time: 12/03/14 11:00 AM  Result Value Ref Range   Ammonia 40 (H) 9 - 35 umol/L  Comprehensive metabolic panel     Status: Abnormal   Collection Time: 12/03/14 11:00 AM  Result Value Ref Range   Sodium 147 (H) 135 - 145 mmol/L   Potassium 5.2 (H) 3.5 - 5.1 mmol/L   Chloride 124 (H) 101 - 111 mmol/L   CO2 5 (L) 22 - 32 mmol/L   Glucose, Bld 115 (H) 65 - 99 mg/dL   BUN 66 (H) 6 - 20 mg/dL   Creatinine, Ser 2.47 (H) 0.44 - 1.00 mg/dL   Calcium 10.6 (H) 8.9 - 10.3 mg/dL   Total Protein 8.3 (H) 6.5 - 8.1 g/dL    Albumin 4.5 3.5 - 5.0 g/dL   AST 40 15 - 41 U/L   ALT 20 14 - 54 U/L   Alkaline Phosphatase 107 38 - 126 U/L   Total Bilirubin 0.8 0.3 - 1.2 mg/dL   GFR calc non Af Amer 19 (L) >60 mL/min   GFR calc Af Amer 22 (L) >60 mL/min    Comment: (NOTE) The eGFR has been calculated using the CKD EPI equation. This calculation has not been validated in all clinical situations. eGFR's persistently <60 mL/min signify possible Chronic Kidney Disease.    Anion gap 18 (H) 5 - 15  CBC WITH DIFFERENTIAL     Status: Abnormal   Collection Time: 12/03/14 11:00 AM  Result Value Ref Range   WBC 18.6 (H) 4.0 - 10.5 K/uL    Comment: WHITE COUNT CONFIRMED ON SMEAR   RBC 3.89 3.87 - 5.11 MIL/uL   Hemoglobin 13.7 12.0 - 15.0 g/dL   HCT 40.5 36.0 - 46.0 %   MCV 104.1 (H) 78.0 - 100.0 fL   MCH 35.2 (H) 26.0 - 34.0 pg   MCHC 33.8 30.0 - 36.0 g/dL   RDW 21.0 (H) 11.5 - 15.5 %   Platelets 205 150 - 400 K/uL    Comment: SPECIMEN CHECKED FOR CLOTS PLATELET COUNT CONFIRMED BY SMEAR  Neutrophils Relative % 91 %   Lymphocytes Relative 4 %   Monocytes Relative 5 %   Eosinophils Relative 0 %   Basophils Relative 0 %   Neutro Abs 17.0 (H) 1.7 - 7.7 K/uL   Lymphs Abs 0.7 0.7 - 4.0 K/uL   Monocytes Absolute 0.9 0.1 - 1.0 K/uL   Eosinophils Absolute 0.0 0.0 - 0.7 K/uL   Basophils Absolute 0.0 0.0 - 0.1 K/uL   RBC Morphology POLYCHROMASIA PRESENT   Ethanol     Status: None   Collection Time: 12/03/14 11:00 AM  Result Value Ref Range   Alcohol, Ethyl (B) <5 <5 mg/dL    Comment:        LOWEST DETECTABLE LIMIT FOR SERUM ALCOHOL IS 5 mg/dL FOR MEDICAL PURPOSES ONLY   Lactic acid, plasma     Status: Abnormal   Collection Time: 12/03/14 11:00 AM  Result Value Ref Range   Lactic Acid, Venous 2.4 (HH) 0.5 - 2.0 mmol/L    Comment: CRITICAL RESULT CALLED TO, READ BACK BY AND VERIFIED WITH: BLACKBUR, C AT 1139 ON 12/03/2014 BY WOODS,M   Urine culture     Status: None   Collection Time: 12/03/14 11:00 AM  Result  Value Ref Range   Specimen Description URINE, CATHETERIZED    Special Requests Normal    Culture      NO GROWTH 1 DAY Performed at [facility]    Report Status 12/04/2014 FINAL   Culture, blood (routine x 2)     Status: None (Preliminary result)   Collection Time: 12/03/14 11:00 AM  Result Value Ref Range   Specimen Description RIGHT ANTECUBITAL    Special Requests BOTTLES DRAWN AEROBIC AND ANAEROBIC 6CC EACH    Culture NO GROWTH < 24 HOURS    Report Status PENDING   Urinalysis, Routine w reflex microscopic (not at [facility])     Status: Abnormal   Collection Time: 12/03/14 11:00 AM  Result Value Ref Range   Color, Urine YELLOW YELLOW   APPearance CLEAR CLEAR   Specific Gravity, Urine 1.025 1.005 - 1.030   pH 5.0 5.0 - 8.0   Glucose, UA NEGATIVE NEGATIVE mg/dL   Hgb urine dipstick SMALL (A) NEGATIVE   Bilirubin Urine NEGATIVE NEGATIVE   Ketones, ur NEGATIVE NEGATIVE mg/dL   Protein, ur 30 (A) NEGATIVE mg/dL   Nitrite NEGATIVE NEGATIVE   Leukocytes, UA NEGATIVE NEGATIVE  Salicylate level     Status: None   Collection Time: 12/03/14 11:00 AM  Result Value Ref Range   Salicylate Lvl <7.1 2.8 - 30.0 mg/dL  Acetaminophen level     Status: Abnormal   Collection Time: 12/03/14 11:00 AM  Result Value Ref Range   Acetaminophen (Tylenol), Serum <10 (L) 10 - 30 ug/mL    Comment:        THERAPEUTIC CONCENTRATIONS VARY SIGNIFICANTLY. A RANGE OF 10-30 ug/mL MAY BE AN EFFECTIVE CONCENTRATION FOR MANY PATIENTS. HOWEVER, SOME ARE BEST TREATED AT CONCENTRATIONS OUTSIDE THIS RANGE. ACETAMINOPHEN CONCENTRATIONS >150 ug/mL AT 4 HOURS AFTER INGESTION AND >50 ug/mL AT 12 HOURS AFTER INGESTION ARE OFTEN ASSOCIATED WITH TOXIC REACTIONS.   Urine rapid drug screen (hosp performed)     Status: None   Collection Time: 12/03/14 11:00 AM  Result Value Ref Range   Opiates NONE DETECTED NONE DETECTED   Cocaine NONE DETECTED NONE DETECTED   Benzodiazepines NONE DETECTED NONE DETECTED    Amphetamines NONE DETECTED NONE DETECTED   Tetrahydrocannabinol NONE DETECTED NONE DETECTED   Barbiturates  NONE DETECTED NONE DETECTED    Comment:        DRUG SCREEN FOR MEDICAL PURPOSES ONLY.  IF CONFIRMATION IS NEEDED FOR ANY PURPOSE, NOTIFY LAB WITHIN 5 DAYS.        LOWEST DETECTABLE LIMITS FOR URINE DRUG SCREEN Drug Class       Cutoff (ng/mL) Amphetamine      1000 Barbiturate      200 Benzodiazepine   782 Tricyclics       956 Opiates          300 Cocaine          300 THC              50   Urine microscopic-add on     Status: Abnormal   Collection Time: 12/03/14 11:00 AM  Result Value Ref Range   Squamous Epithelial / LPF NONE SEEN NONE SEEN    Comment: Please note change in reference range.   WBC, UA NONE SEEN 0 - 5 WBC/hpf    Comment: Please note change in reference range.   RBC / HPF 0-5 0 - 5 RBC/hpf    Comment: Please note change in reference range.   Bacteria, UA FEW (A) NONE SEEN    Comment: Please note change in reference range.  Culture, blood (routine x 2)     Status: None (Preliminary result)   Collection Time: 12/03/14 11:20 AM  Result Value Ref Range   Specimen Description RIGHT ANTECUBITAL    Special Requests BOTTLES DRAWN AEROBIC AND ANAEROBIC 6CC EACH    Culture NO GROWTH < 24 HOURS    Report Status PENDING   CK     Status: Abnormal   Collection Time: 12/03/14 12:00 PM  Result Value Ref Range   Total CK 356 (H) 38 - 234 U/L  CSF cell count with differential collection tube #: 4     Status: Abnormal   Collection Time: 12/03/14  4:29 PM  Result Value Ref Range   Tube # 4    Color, CSF COLORLESS COLORLESS   Appearance, CSF CLEAR CLEAR   Supernatant NOT INDICATED    RBC Count, CSF 3 (H) 0 /cu mm   WBC, CSF 2 0 - 5 /cu mm   Segmented Neutrophils-CSF TOO FEW TO COUNT, SMEAR AVAILABLE FOR REVIEW 0 - 6 %   Lymphs, CSF TOO FEW TO COUNT, SMEAR AVAILABLE FOR REVIEW 40 - 80 %   Monocyte-Macrophage-Spinal Fluid TOO FEW TO COUNT, SMEAR AVAILABLE FOR REVIEW 15  - 45 %   Eosinophils, CSF TOO FEW TO COUNT, SMEAR AVAILABLE FOR REVIEW 0 - 1 %   Other Cells, CSF TOO FEW TO COUNT, SMEAR AVAILABLE FOR REVIEW   CSF culture     Status: None (Preliminary result)   Collection Time: 12/03/14  4:29 PM  Result Value Ref Range   Specimen Description BACK    Special Requests Immunocompromised    Gram Stain      CYTOSPIN SLIDE NO WBC SEEN NO ORGANISMS SEEN CONFIRMED BY R.GREENE    Culture      NO GROWTH < 24 HOURS Performed at [facility]    Report Status PENDING   Gram stain     Status: None   Collection Time: 12/03/14  4:29 PM  Result Value Ref Range   Specimen Description BACK    Special Requests Immunocompromised    Gram Stain NO ORGANISMS SEEN NO WBC SEEN     Report Status 12/03/2014 FINAL  Glucose, CSF     Status: Abnormal   Collection Time: 12/03/14  4:29 PM  Result Value Ref Range   Glucose, CSF 82 (H) 40 - 70 mg/dL  Protein, CSF     Status: Abnormal   Collection Time: 12/03/14  4:29 PM  Result Value Ref Range   Total  Protein, CSF >600 (H) 15 - 45 mg/dL    Comment: RESULTS CONFIRMED BY MANUAL DILUTION  Cryptococcal antigen, CSF     Status: None   Collection Time: 12/03/14  4:29 PM  Result Value Ref Range   Crypto Ag NEGATIVE NEGATIVE   Cryptococcal Ag Titer NOT INDICATED NOT INDICATED    Comment: Performed at [facility]  VDRL, CSF     Status: None   Collection Time: 12/03/14  4:29 PM  Result Value Ref Range   VDRL Quant, CSF Non Reactive Non Rea:<1:1    Comment: (NOTE) Performed At: [facility] [place], [place] [id number] Lindon Romp MD HQ:[id number]   Miscellaneous test     Status: None   Collection Time: 12/03/14  4:29 PM  Result Value Ref Range   Miscellaneous Test HSVPCR AT [facility] REPORT TO FOLLOW    Miscellaneous Test Results SEE SEPARATE REPORT   I-Stat CG4 Lactic Acid, ED     Status: None   Collection Time: 12/03/14  5:53 PM  Result Value Ref Range   Lactic Acid, Venous  1.23 0.5 - 2.0 mmol/L  Osmolality     Status: Abnormal   Collection Time: 12/03/14  5:59 PM  Result Value Ref Range   Osmolality 330 (HH) 275 - 295 mOsm/kg    Comment: Please note change in reference range. CRITICAL RESULT CALLED TO, READ BACK BY AND VERIFIED WITH:  TALLBOT,T @ 2045 ON 12/03/14 BY WOODIE,J(WOOTEN,K MC)   Ethylene glycol     Status: None   Collection Time: 12/03/14  5:59 PM  Result Value Ref Range   Ethylene Glycol Lvl None Detected None detected mg/dL    Comment: (NOTE) TESTING PERFORMED BY NANCY DENNY.  RESULTS CALLED TO JAMIE WOODIE ON 12-04-2014 AT 12:38 AM.                                Detection Limit = 5 Performed At: [facility] [place], [place] [id number] Lindon Romp MD BL:[id number] CORRECTED ON 11/28 AT 0133: PREVIOUSLY REPORTED AS NONE DETECTED   Blood gas, arterial     Status: Abnormal   Collection Time: 12/03/14  7:11 PM  Result Value Ref Range   FIO2 0.21    O2 Content 21.0 L/min   Delivery systems ROOM AIR    pH, Arterial 7.036 (LL) 7.350 - 7.450    Comment: CRITICAL RESULT CALLED TO, READ BACK BY AND VERIFIED WITH: PENNY WATKINS,RN 0N 12/03/2014 BY PEVIANY LAWSON,RRT AT 1927    pCO2 arterial 12.1 (LL) 35.0 - 45.0 mmHg    Comment: CRITICAL RESULT CALLED TO, READ BACK BY AND VERIFIED WITH: PENNY WATKINS,RN ON 12/03/2014 BY PEVIANY LAWSON,RRT AT 1927    pO2, Arterial 129 (H) 80.0 - 100.0 mmHg   Bicarbonate 5.9 (L) 20.0 - 24.0 mEq/L   Acid-base deficit 26.0 (H) 0.0 - 2.0 mmol/L   O2 Saturation 97.4 %   Collection site BRACHIAL ARTERY    Drawn by [id number]    Sample type ARTERIAL    Allens test (pass/fail) NOT INDICATED (A) PASS  Type  and screen [facility]     Status: None (Preliminary result)   Collection Time: 12/03/14  9:05 PM  Result Value Ref Range   ABO/RH(D) A POS    Antibody Screen NEG    Sample Expiration 12/06/2014    Unit Number [id number]    Blood Component Type RED CELLS,LR    Unit  division 00    Status of Unit ISSUED    Transfusion Status OK TO TRANSFUSE    Crossmatch Result Compatible    Unit Number [id number]    Blood Component Type RED CELLS,LR    Unit division 00    Status of Unit ISSUED,FINAL    Transfusion Status OK TO TRANSFUSE    Crossmatch Result Compatible    Unit Number [id number]    Blood Component Type RED CELLS,LR    Unit division 00    Status of Unit ALLOCATED    Transfusion Status OK TO TRANSFUSE    Crossmatch Result Compatible   Prepare RBC     Status: None   Collection Time: 12/03/14  9:05 PM  Result Value Ref Range   Order Confirmation ORDER PROCESSED BY BLOOD BANK   Protime-INR     Status: Abnormal   Collection Time: 12/03/14  9:05 PM  Result Value Ref Range   Prothrombin Time 18.5 (H) 11.6 - 15.2 seconds   INR 1.54 (H) 0.00 - 1.49  APTT     Status: Abnormal   Collection Time: 12/03/14  9:05 PM  Result Value Ref Range   aPTT 47 (H) 24 - 37 seconds    Comment:        IF BASELINE aPTT IS ELEVATED, SUGGEST PATIENT RISK ASSESSMENT BE USED TO DETERMINE APPROPRIATE ANTICOAGULANT THERAPY.   Blood gas, arterial     Status: Abnormal   Collection Time: 12/03/14 10:50 PM  Result Value Ref Range   FIO2 0.60    Delivery systems PRESSURE REGULATED VOLUME CONTROL    Mode VENTILATOR    VT 500 mL   LHR 18 resp/min   Peep/cpap 5.0 cm H20   pH, Arterial 7.208 (L) 7.350 - 7.450   pCO2 arterial 21.9 (L) 35.0 - 45.0 mmHg   pO2, Arterial 298.0 (H) 80.0 - 100.0 mmHg   Bicarbonate 10.8 (L) 20.0 - 24.0 mEq/L   Acid-base deficit 18.1 (H) 0.0 - 2.0 mmol/L   O2 Saturation 99.7 %   Patient temperature 37.6    Collection site BRACHIAL ARTERY    Drawn by [id number]    Sample type ARTERIAL    Allens test (pass/fail) NOT INDICATED (A) PASS  CBC with Differential     Status: Abnormal   Collection Time: 12/04/14  1:15 AM  Result Value Ref Range   WBC 2.1 (L) 4.0 - 10.5 K/uL   RBC 2.36 (L) 3.87 - 5.11 MIL/uL   Hemoglobin 8.3 (L) 12.0 - 15.0 g/dL     Comment: DELTA CHECK NOTED   HCT 23.7 (L) 36.0 - 46.0 %   MCV 100.4 (H) 78.0 - 100.0 fL   MCH 35.2 (H) 26.0 - 34.0 pg   MCHC 35.0 30.0 - 36.0 g/dL   RDW 20.9 (H) 11.5 - 15.5 %   Platelets 102 (L) 150 - 400 K/uL    Comment: SPECIMEN CHECKED FOR CLOTS   Neutrophils Relative % 90 %   Lymphocytes Relative 5 %   Monocytes Relative 3 %   Eosinophils Relative 0 %   Basophils Relative 0 %   Band Neutrophils 2 %  Metamyelocytes Relative 0 %   Myelocytes 0 %   Promyelocytes Absolute 0 %   Blasts 0 %   nRBC 6 (H) 0 /100 WBC   Other 0 %   Neutro Abs 1.9 1.7 - 7.7 K/uL   Lymphs Abs 0.1 (L) 0.7 - 4.0 K/uL   Monocytes Absolute 0.1 0.1 - 1.0 K/uL   Eosinophils Absolute 0.0 0.0 - 0.7 K/uL   Basophils Absolute 0.0 0.0 - 0.1 K/uL   RBC Morphology RARE NRBCs   Comprehensive metabolic panel     Status: Abnormal   Collection Time: 12/04/14  1:15 AM  Result Value Ref Range   Sodium 149 (H) 135 - 145 mmol/L   Potassium 2.4 (LL) 3.5 - 5.1 mmol/L    Comment: RESULT REPEATED AND VERIFIED CRITICAL RESULT CALLED TO, READ BACK BY AND VERIFIED WITH:  NEILSON,T @ 0340 ON 12/04/14 BY WOODIE,J    Chloride 128 (H) 101 - 111 mmol/L   CO2 10 (L) 22 - 32 mmol/L   Glucose, Bld 126 (H) 65 - 99 mg/dL   BUN 56 (H) 6 - 20 mg/dL   Creatinine, Ser 2.06 (H) 0.44 - 1.00 mg/dL   Calcium 7.3 (L) 8.9 - 10.3 mg/dL    Comment: DELTA CHECK NOTED   Total Protein 5.3 (L) 6.5 - 8.1 g/dL   Albumin 2.7 (L) 3.5 - 5.0 g/dL   AST 45 (H) 15 - 41 U/L   ALT 18 14 - 54 U/L   Alkaline Phosphatase 85 38 - 126 U/L   Total Bilirubin 0.8 0.3 - 1.2 mg/dL   GFR calc non Af Amer 24 (L) >60 mL/min   GFR calc Af Amer 27 (L) >60 mL/min    Comment: (NOTE) The eGFR has been calculated using the CKD EPI equation. This calculation has not been validated in all clinical situations. eGFR's persistently <60 mL/min signify possible Chronic Kidney Disease.    Anion gap 11 5 - 15  Lactic acid, plasma     Status: None   Collection Time:  12/04/14  1:15 AM  Result Value Ref Range   Lactic Acid, Venous 1.5 0.5 - 2.0 mmol/L  Procalcitonin     Status: None   Collection Time: 12/04/14  1:15 AM  Result Value Ref Range   Procalcitonin 15.48 ng/mL    Comment:        Interpretation: PCT >= 10 ng/mL: Important systemic inflammatory response, almost exclusively due to severe bacterial sepsis or septic shock. (NOTE)         ICU PCT Algorithm               Non ICU PCT Algorithm    ----------------------------     ------------------------------         PCT < 0.25 ng/mL                 PCT < 0.1 ng/mL     Stopping of antibiotics            Stopping of antibiotics       strongly encouraged.               strongly encouraged.    ----------------------------     ------------------------------       PCT level decrease by               PCT < 0.25 ng/mL       >= 80% from peak PCT       OR PCT 0.25 - 0.5  ng/mL          Stopping of antibiotics                                             encouraged.     Stopping of antibiotics           encouraged.    ----------------------------     ------------------------------       PCT level decrease by              PCT >= 0.25 ng/mL       < 80% from peak PCT        AND PCT >= 0.5 ng/mL             Continuing antibiotics                                              encouraged.       Continuing antibiotics            encouraged.    ----------------------------     ------------------------------     PCT level increase compared          PCT > 0.5 ng/mL         with peak PCT AND          PCT >= 0.5 ng/mL             Escalation of antibiotics                                          strongly encouraged.      Escalation of antibiotics        strongly encouraged.   TSH     Status: None   Collection Time: 12/04/14  1:15 AM  Result Value Ref Range   TSH 2.745 0.350 - 4.500 uIU/mL  MRSA PCR Screening     Status: Abnormal   Collection Time: 12/04/14  2:31 AM  Result Value Ref Range   MRSA by PCR  POSITIVE (A) NEGATIVE    Comment:        The GeneXpert MRSA Assay (FDA approved for NASAL specimens only), is one component of a comprehensive MRSA colonization surveillance program. It is not intended to diagnose MRSA infection nor to guide or monitor treatment for MRSA infections. RESULT CALLED TO, READ BACK BY AND VERIFIED WITH: Carron Curie ON [id number] AT 8242 BY RESSEGGER R   Magnesium     Status: None   Collection Time: 12/04/14  4:50 AM  Result Value Ref Range   Magnesium 1.9 1.7 - 2.4 mg/dL  Blood gas, arterial     Status: Abnormal   Collection Time: 12/04/14  5:59 AM  Result Value Ref Range   FIO2 35.00    Delivery systems VENTILATOR    Mode PRESSURE REGULATED VOLUME CONTROL    VT 500 mL   LHR 20 resp/min   Peep/cpap 5.0 cm H20   pH, Arterial 7.443 7.350 - 7.450   pCO2 arterial 15.6 (LL) 35.0 - 45.0 mmHg    Comment: CRITICAL RESULT CALLED TO, READ BACK BY AND VERIFIED WITH:  TRACEY NEILSON RN BY K [facility]  RRT RCP ON 12/04/2014 AT 0608    pO2, Arterial 178 (H) 80.0 - 100.0 mmHg   Bicarbonate 14.7 (L) 20.0 - 24.0 mEq/L   TCO2 11.6 0 - 100 mmol/L   Acid-Base Excess 13.1 (H) 0.0 - 2.0 mmol/L   O2 Saturation 99.7 %   Patient temperature 37.0    Collection site RIGHT BRACHIAL    Drawn by [id number]    Sample type ARTERIAL    Allens test (pass/fail) PASS PASS  Basic metabolic panel     Status: Abnormal   Collection Time: 12/04/14 10:25 AM  Result Value Ref Range   Sodium 148 (H) 135 - 145 mmol/L   Potassium 2.7 (LL) 3.5 - 5.1 mmol/L    Comment: CRITICAL RESULT CALLED TO, READ BACK BY AND VERIFIED WITH: ROWE,N AT 11:05AM ON 12/04/14 BY FESTERMAN,C    Chloride 125 (H) 101 - 111 mmol/L   CO2 13 (L) 22 - 32 mmol/L   Glucose, Bld 139 (H) 65 - 99 mg/dL   BUN 59 (H) 6 - 20 mg/dL   Creatinine, Ser 2.13 (H) 0.44 - 1.00 mg/dL   Calcium 7.1 (L) 8.9 - 10.3 mg/dL   GFR calc non Af Amer 23 (L) >60 mL/min   GFR calc Af Amer 26 (L) >60 mL/min    Comment: (NOTE) The eGFR has been  calculated using the CKD EPI equation. This calculation has not been validated in all clinical situations. eGFR's persistently <60 mL/min signify possible Chronic Kidney Disease.    Anion gap 10 5 - 15  CBC     Status: Abnormal   Collection Time: 12/04/14 10:25 AM  Result Value Ref Range   WBC 2.7 (L) 4.0 - 10.5 K/uL   RBC 2.44 (L) 3.87 - 5.11 MIL/uL   Hemoglobin 8.5 (L) 12.0 - 15.0 g/dL   HCT 24.1 (L) 36.0 - 46.0 %   MCV 98.8 78.0 - 100.0 fL   MCH 34.8 (H) 26.0 - 34.0 pg   MCHC 35.3 30.0 - 36.0 g/dL   RDW 21.3 (H) 11.5 - 15.5 %   Platelets 86 (L) 150 - 400 K/uL    Comment: SPECIMEN CHECKED FOR CLOTS PLATELET COUNT CONFIRMED BY SMEAR   Protime-INR     Status: Abnormal   Collection Time: 12/04/14 10:25 AM  Result Value Ref Range   Prothrombin Time 19.4 (H) 11.6 - 15.2 seconds   INR 1.63 (H) 0.00 - 1.49  APTT     Status: Abnormal   Collection Time: 12/04/14 10:25 AM  Result Value Ref Range   aPTT 55 (H) 24 - 37 seconds    Comment:        IF BASELINE aPTT IS ELEVATED, SUGGEST PATIENT RISK ASSESSMENT BE USED TO DETERMINE APPROPRIATE ANTICOAGULANT THERAPY.   Prepare RBC     Status: None   Collection Time: 12/04/14 10:42 AM  Result Value Ref Range   Order Confirmation ORDER PROCESSED BY BLOOD BANK   Comprehensive metabolic panel     Status: Abnormal   Collection Time: 12/05/14  4:30 AM  Result Value Ref Range   Sodium 146 (H) 135 - 145 mmol/L   Potassium 2.8 (L) 3.5 - 5.1 mmol/L   Chloride 119 (H) 101 - 111 mmol/L   CO2 17 (L) 22 - 32 mmol/L   Glucose, Bld 129 (H) 65 - 99 mg/dL   BUN 47 (H) 6 - 20 mg/dL   Creatinine, Ser 1.81 (H) 0.44 - 1.00 mg/dL   Calcium 6.4 (LL) 8.9 - 10.3  mg/dL    Comment: CRITICAL RESULT CALLED TO, READ BACK BY AND VERIFIED WITH: HAMMOCK,S AT 6:00AM ON 12/05/14 BY FESTERMAN,C    Total Protein 4.8 (L) 6.5 - 8.1 g/dL   Albumin 2.3 (L) 3.5 - 5.0 g/dL   AST 61 (H) 15 - 41 U/L   ALT 22 14 - 54 U/L   Alkaline Phosphatase 98 38 - 126 U/L   Total  Bilirubin 1.3 (H) 0.3 - 1.2 mg/dL   GFR calc non Af Amer 28 (L) >60 mL/min   GFR calc Af Amer 32 (L) >60 mL/min    Comment: (NOTE) The eGFR has been calculated using the CKD EPI equation. This calculation has not been validated in all clinical situations. eGFR's persistently <60 mL/min signify possible Chronic Kidney Disease.    Anion gap 10 5 - 15  Magnesium     Status: Abnormal   Collection Time: 12/05/14  4:30 AM  Result Value Ref Range   Magnesium 1.3 (L) 1.7 - 2.4 mg/dL  Phosphorus     Status: Abnormal   Collection Time: 12/05/14  4:30 AM  Result Value Ref Range   Phosphorus 1.0 (LL) 2.5 - 4.6 mg/dL    Comment: CRITICAL RESULT CALLED TO, READ BACK BY AND VERIFIED WITH: HAMMOCK,S AT 6:00AM ON 12/05/14 BY FESTERMAN,C   Triglycerides     Status: Abnormal   Collection Time: 12/05/14  4:30 AM  Result Value Ref Range   Triglycerides 430 (H) <150 mg/dL  TSH     Status: None   Collection Time: 12/05/14  4:30 AM  Result Value Ref Range   TSH 1.699 0.350 - 4.500 uIU/mL  CBC with Differential/Platelet     Status: Abnormal   Collection Time: 12/05/14  4:30 AM  Result Value Ref Range   WBC 5.5 4.0 - 10.5 K/uL   RBC 3.21 (L) 3.87 - 5.11 MIL/uL   Hemoglobin 10.2 (L) 12.0 - 15.0 g/dL   HCT 29.1 (L) 36.0 - 46.0 %   MCV 90.7 78.0 - 100.0 fL    Comment: DELTA CHECK NOTED   MCH 31.8 26.0 - 34.0 pg   MCHC 35.1 30.0 - 36.0 g/dL   RDW 20.3 (H) 11.5 - 15.5 %   Platelets 47 (L) 150 - 400 K/uL   Neutrophils Relative % 87 %   Neutro Abs 4.8 1.7 - 7.7 K/uL   Lymphocytes Relative 7 %   Lymphs Abs 0.4 (L) 0.7 - 4.0 K/uL   Monocytes Relative 4 %   Monocytes Absolute 0.2 0.1 - 1.0 K/uL   Eosinophils Relative 2 %   Eosinophils Absolute 0.1 0.0 - 0.7 K/uL   Basophils Relative 0 %   Basophils Absolute 0.0 0.0 - 0.1 K/uL   WBC Morphology INCREASED BANDS (>20% BANDS)   Blood gas, arterial     Status: Abnormal   Collection Time: 12/05/14  5:40 AM  Result Value Ref Range   FIO2 60.00     Delivery systems VENTILATOR    Mode PRESSURE REGULATED VOLUME CONTROL    VT 500 mL   LHR 14 resp/min   Peep/cpap 5.0 cm H20   pH, Arterial 7.404 7.350 - 7.450   pCO2 arterial 48.2 (H) 35.0 - 45.0 mmHg   pO2, Arterial 141.0 (H) 80.0 - 100.0 mmHg   Bicarbonate 28.6 (H) 20.0 - 24.0 mEq/L   TCO2 13.5 0 - 100 mmol/L   Acid-Base Excess 4.9 (H) 0.0 - 2.0 mmol/L   O2 Saturation 98.7 %   Patient temperature 37.0  Collection site RIGHT RADIAL    Drawn by [id number]    Sample type ARTERIAL    Allens test (pass/fail) PASS PASS    ABGS  Recent Labs  12/05/14 0540  PHART 7.404  PO2ART 141.0*  TCO2 13.5  HCO3 28.6*   CULTURES Recent Results (from the past 240 hour(s))  Urine culture     Status: None   Collection Time: 12/03/14 11:00 AM  Result Value Ref Range Status   Specimen Description URINE, CATHETERIZED  Final   Special Requests Normal  Final   Culture   Final    NO GROWTH 1 DAY Performed at [facility]    Report Status 12/04/2014 FINAL  Final  Culture, blood (routine x 2)     Status: None (Preliminary result)   Collection Time: 12/03/14 11:00 AM  Result Value Ref Range Status   Specimen Description RIGHT ANTECUBITAL  Final   Special Requests BOTTLES DRAWN AEROBIC AND ANAEROBIC [place]  Final   Culture NO GROWTH < 24 HOURS  Final   Report Status PENDING  Incomplete  Culture, blood (routine x 2)     Status: None (Preliminary result)   Collection Time: 12/03/14 11:20 AM  Result Value Ref Range Status   Specimen Description RIGHT ANTECUBITAL  Final   Special Requests BOTTLES DRAWN AEROBIC AND ANAEROBIC [place]  Final   Culture NO GROWTH < 24 HOURS  Final   Report Status PENDING  Incomplete  CSF culture     Status: None (Preliminary result)   Collection Time: 12/03/14  4:29 PM  Result Value Ref Range Status   Specimen Description BACK  Final   Special Requests Immunocompromised  Final   Gram Stain   Final    CYTOSPIN SLIDE NO WBC SEEN NO ORGANISMS  SEEN CONFIRMED BY R.GREENE    Culture   Final    NO GROWTH < 24 HOURS Performed at [facility]    Report Status PENDING  Incomplete  Gram stain     Status: None   Collection Time: 12/03/14  4:29 PM  Result Value Ref Range Status   Specimen Description BACK  Final   Special Requests Immunocompromised  Final   Gram Stain NO ORGANISMS SEEN NO WBC SEEN   Final   Report Status 12/03/2014 FINAL  Final  MRSA PCR Screening     Status: Abnormal   Collection Time: 12/04/14  2:31 AM  Result Value Ref Range Status   MRSA by PCR POSITIVE (A) NEGATIVE Final    Comment:        The GeneXpert MRSA Assay (FDA approved for NASAL specimens only), is one component of a comprehensive MRSA colonization surveillance program. It is not intended to diagnose MRSA infection nor to guide or monitor treatment for MRSA infections. RESULT CALLED TO, READ BACK BY AND VERIFIED WITH: Carron Curie ON [id number] AT 0610 BY RESSEGGER R    Studies/Results: Ct Head Wo Contrast  12/03/2014  CLINICAL DATA:  69 year old female found down by family. Uncertain how long patient has been down (last contact with family members was 3 days ago). EXAM: CT HEAD WITHOUT CONTRAST TECHNIQUE: Contiguous axial images were obtained from the base of the skull through the vertex without intravenous contrast. COMPARISON:  Head CT 04/26/2014. FINDINGS: Low-attenuation in the inferior aspect of the right frontal lobe, mild low-attenuation in the inferior aspect of the medial left frontal lobe, and low-attenuation in the anterior aspect of the right temporal lobe, all similar to the prior examination,  likely to reflect gliosis from remote trauma. Physiologic calcifications in the basal ganglia bilaterally. No acute intracranial abnormalities. Specifically, no evidence of acute intracranial hemorrhage, no definite findings of acute/subacute cerebral ischemia, no mass, mass effect, hydrocephalus or abnormal intra or extra-axial fluid  collections. Visualized paranasal sinuses and mastoids are well pneumatized. No acute displaced skull fractures are identified. IMPRESSION: 1. No acute intracranial abnormalities. 2. Areas of probable gliosis in the frontal lobes bilaterally (right greater than left) and anterior aspect of the right temporal lobe, unchanged compared to the prior study, likely related to remote trauma. Electronically Signed   By: Vinnie Langton M.D.   On: 12/03/2014 13:07   Dg Chest Port 1 View  12/05/2014  CLINICAL DATA:  Hypoxia EXAM: PORTABLE CHEST 1 VIEW COMPARISON:  December 04, 2014 FINDINGS: Endotracheal tube tip is 2.1 cm above the carina. No pneumothorax. There is a minimal right pleural effusion. There is slight bibasilar atelectasis. Lungs elsewhere clear. Heart size and pulmonary vascularity are normal. No adenopathy. A surgical clip is again noted at the level of the aortopulmonary window. IMPRESSION: Endotracheal tube as described without pneumothorax. Slight bibasilar atelectasis. Minimal right pleural effusion. No airspace consolidation appreciable. Electronically Signed   By: Lowella Grip III M.D.   On: 12/05/2014 08:05   Dg Chest Port 1 View  12/04/2014  CLINICAL DATA:  Sepsis EXAM: PORTABLE CHEST 1 VIEW COMPARISON:  12/03/2014 FINDINGS: Endotracheal tube tip is 3.5 cm above the carina. There is improvement, with partial clearance of basilar opacities, now with better definition of the diaphragmatic contours. No pneumothorax. IMPRESSION: Satisfactory ET tube position. Partial clearance of basilar opacities. Electronically Signed   By: Andreas Newport M.D.   On: 12/04/2014 06:43   Dg Chest Portable 1 View  12/03/2014  CLINICAL DATA:  Intubation. EXAM: PORTABLE CHEST 1 VIEW COMPARISON:  12/03/2014 FINDINGS: The endotracheal tube tip is 1.8 cm above the carina. Streaky opacities persist in both bases. No large effusions. No pneumothorax. IMPRESSION: Satisfactory ET tube position. No other  significant interval change. Electronically Signed   By: Andreas Newport M.D.   On: 12/03/2014 21:26   Dg Chest Port 1 View  12/03/2014  CLINICAL DATA:  Follow-up right subclavian central line attempt EXAM: PORTABLE CHEST 1 VIEW COMPARISON:  Prior film same day FINDINGS: Cardiomediastinal silhouette is stable. Persistent streaky right basilar atelectasis or infiltrate. There is no pneumothorax. Degenerative changes right shoulder. No pulmonary edema. Moderate gaseous distension of the stomach. IMPRESSION: Persistent streaky right basilar atelectasis or infiltrate. No pneumothorax. Degenerative changes right shoulder. Electronically Signed   By: Lahoma Crocker M.D.   On: 12/03/2014 15:58   Dg Chest Port 1 View  12/03/2014  CLINICAL DATA:  Found unresponsive on the floor, history of breast cancer, hyponatremia EXAM: PORTABLE CHEST 1 VIEW COMPARISON:  08/21/2014 FINDINGS: Cardiomediastinal silhouette is unremarkable. There is streaky right basilar atelectasis or infiltrate. Moderate gaseous distension of the stomach. No pulmonary edema. IMPRESSION: Streaky right basilar atelectasis or infiltrate. Aspiration cannot be excluded. Moderate gastric distension of the stomach. No pulmonary edema. Electronically Signed   By: Lahoma Crocker M.D.   On: 12/03/2014 13:34    Medications:  Prior to Admission:  Prescriptions prior to admission  Medication Sig Dispense Refill Last Dose  . aspirin EC 81 MG tablet Take 81 mg by mouth daily.   unknown  . bisacodyl (DULCOLAX) 5 MG EC tablet Take 5 mg by mouth daily as needed for moderate constipation.   unknown  . bumetanide (BUMEX) 1  MG tablet Take 1 mg by mouth daily.    unknown at Unknown time  . calcitRIOL (ROCALTROL) 0.25 MCG capsule Take 0.25 mcg by mouth every other day.   unknown  . cetirizine (ZYRTEC) 10 MG tablet Take 5 mg by mouth daily as needed for allergies.   unknown  . diphenhydramine-acetaminophen (TYLENOL PM) 25-500 MG TABS tablet Take 1 tablet by mouth at  bedtime as needed (sleep).   unknown  . docusate sodium (COLACE) 100 MG capsule Take 100 mg by mouth 2 (two) times daily.   unknown  . esomeprazole (NEXIUM) 40 MG capsule Take 40 mg by mouth daily before breakfast.     unknown  . fluticasone (FLONASE) 50 MCG/ACT nasal spray Place 1 spray into both nostrils daily as needed for allergies.    unknown  . hydrocortisone cream 1 % Apply 1 application topically daily as needed for itching.   unknown  . LYRICA 75 MG capsule Take 75 mg by mouth 3 (three) times daily.   unknown at Unknown time  . magnesium oxide (MAG-OX) 400 MG tablet Take 400 mg by mouth daily.   unknown at Unknown time  . Melatonin 3 MG CAPS Take 3 mg by mouth at bedtime.   unknown  . metoprolol (TOPROL-XL) 200 MG 24 hr tablet Take 200 mg by mouth daily. Take along with 50 mg tablet to equal 200 mg.   unknown at Unknown time  . metoprolol succinate (TOPROL-XL) 100 MG 24 hr tablet Take 1 tablet (100 mg total) by mouth daily. Take with or immediately following a meal. 30 tablet 0 unknown  . metoprolol succinate (TOPROL-XL) 50 MG 24 hr tablet Take 50 mg by mouth daily. Take along with 200 mg tablet to equal 250 mg daily.   unknown  . montelukast (SINGULAIR) 10 MG tablet Take 10 mg by mouth at bedtime.     unknown at Unknown time  . Multiple Vitamins-Minerals (MULTIVITAMINS THER. W/MINERALS) TABS Take 1 tablet by mouth daily.     unknown  . oxyCODONE-acetaminophen (PERCOCET) 10-325 MG per tablet Take 1 tablet by mouth every 6 (six) hours as needed for pain. 20 tablet 0 unknown  . polyethylene glycol (MIRALAX / GLYCOLAX) packet Take 17 g by mouth daily as needed (constipation).    unknown  . PROAIR HFA 108 (90 BASE) MCG/ACT inhaler Take 2 puffs by mouth every 6 (six) hours as needed.   unknown  . sertraline (ZOLOFT) 50 MG tablet Take 100 mg by mouth at bedtime.   unknown at Unknown time  . clonazePAM (KLONOPIN) 0.5 MG tablet Take 1 tablet by mouth twice daily x 30 days. (use for Klonopin)  (Patient not taking: Reported on 12/04/2014) 30 tablet 0 Not Taking at Unknown time  . sodium bicarbonate 650 MG tablet Take 1 tablet (650 mg total) by mouth 2 (two) times daily. (Patient not taking: Reported on 12/04/2014)   Not Taking at Unknown time   Scheduled: . sodium chloride   Intravenous Once  . antiseptic oral rinse  7 mL Mouth Rinse QID  . aztreonam  1 g Intravenous Q8H  . chlorhexidine gluconate  15 mL Mouth Rinse BID  . Chlorhexidine Gluconate Cloth  6 each Topical Q0600  . levETIRAcetam  1,000 mg Intravenous Q12H  . levofloxacin (LEVAQUIN) IV  750 mg Intravenous Q48H  . linezolid (ZYVOX) IV  600 mg Intravenous Q12H  . magnesium sulfate 1 - 4 g bolus IVPB  2 g Intravenous Once  . mupirocin ointment  1  application Nasal BID  . pantoprazole (PROTONIX) IV  40 mg Intravenous Q12H  . potassium chloride  10 mEq Intravenous Q1 Hr x 4  . potassium phosphate IVPB (mmol)  15 mmol Intravenous Once  . sodium chloride  3 mL Intravenous Q12H   Continuous: . phenylephrine (NEO-SYNEPHRINE) Adult infusion Stopped (12/04/14 1552)   VVL:[id number], fentaNYL (SUBLIMAZE) injection, fentaNYL (SUBLIMAZE) injection, midazolam, midazolam  Assesment: She was admitted with shock. She was found unresponsive at home. She was hypothermic which has improved. She's been hypotensive requiring pressor support and that is better. 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 At baseline she has metastatic breast cancer. She was felt to be dehydrated on admission. Her renal function has not deteriorated. Chest x-ray does not show any infiltrate. 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 No new changes today.  Continue antibiotics and pressors as needed.    LOS: 2 days   Shandiin Eisenbeis L 12/05/2014, 9:13 AM

## 2014-12-05 NOTE — Clinical Documentation Improvement (Signed)
Hospitalist  Can the diagnosis of anemia be further specified?   Iron deficiency Anemia  Acute Blood Loss Anemia  Nutritional anemia, including the nutrition or mineral deficits  Chronic Anemia, including the suspected or known cause  Anemia of chronic disease, including the associated chronic disease state  Other  Clinically Undetermined  Document any associated diagnoses/conditions.   Supporting Information: Patient with anemia per 11/27 progress notes. Patient with hematemesis per 11/28 progress notes. Transfused 2 units of PRBC per 11/28 progress notes.  Labs:   H/H: 11/28:  8.3/23.7. 11/27: 13.7/40.5.   Please exercise your independent, professional judgment when responding. A specific answer is not anticipated or expected.   Thank You,  [place] [phone]

## 2014-12-05 NOTE — Progress Notes (Signed)
EEG Completed; Results Pending  

## 2014-12-05 NOTE — Plan of Care (Addendum)
Problem: Pain Managment: Goal: General experience of comfort will improve Outcome: Progressing Monitored pain management throughout the shift.

## 2014-12-05 NOTE — Procedures (Signed)
Catherine A. [name], MD     [email or web address]           HISTORY: The patient is 69 years old and presents with new-onset seizures and severe encephalopathy.  MEDICATIONS: Scheduled Meds: . sodium chloride   Intravenous Once  . antiseptic oral rinse  7 mL Mouth Rinse QID  . aztreonam  1 g Intravenous Q8H  . chlorhexidine gluconate  15 mL Mouth Rinse BID  . Chlorhexidine Gluconate Cloth  6 each Topical Q0600  . insulin aspart  0-9 Units Subcutaneous 6 times per day  . levETIRAcetam  1,000 mg Intravenous Q12H  . levofloxacin (LEVAQUIN) IV  750 mg Intravenous Q48H  . linezolid (ZYVOX) IV  600 mg Intravenous Q12H  . mupirocin ointment  1 application Nasal BID  . pantoprazole (PROTONIX) IV  40 mg Intravenous Q12H  . sodium chloride  3 mL Intravenous Q12H   Continuous Infusions: . Marland KitchenTPN (CLINIMIX-E) Adult 20 mL/hr at 12/05/14 1644  . dextrose 5 % and 0.45 % NaCl with KCl 40 mEq/L 125 mL/hr at 12/05/14 1100   PRN Meds:.fentaNYL (SUBLIMAZE) injection, fentaNYL (SUBLIMAZE) injection, midazolam, midazolam  Prior to Admission medications   Medication Sig Start Date End Date Taking? Authorizing Provider  aspirin EC 81 MG tablet Take 81 mg by mouth daily.   Yes Historical Provider, MD  bisacodyl (DULCOLAX) 5 MG EC tablet Take 5 mg by mouth daily as needed for moderate constipation.   Yes Historical Provider, MD  bumetanide (BUMEX) 1 MG tablet Take 1 mg by mouth daily.    Yes Historical Provider, MD  calcitRIOL (ROCALTROL) 0.25 MCG capsule Take 0.25 mcg by mouth every other day.   Yes Historical Provider, MD  cetirizine (ZYRTEC) 10 MG tablet Take 5 mg by mouth daily as needed for allergies.   Yes Historical Provider, MD  diphenhydramine-acetaminophen (TYLENOL PM) 25-500 MG TABS tablet Take 1 tablet by mouth at bedtime as needed (sleep).   Yes Historical Provider, MD  docusate sodium (COLACE) 100 MG capsule Take 100 mg by mouth 2 (two) times daily.   Yes Historical  Provider, MD  esomeprazole (NEXIUM) 40 MG capsule Take 40 mg by mouth daily before breakfast.     Yes Historical Provider, MD  fluticasone (FLONASE) 50 MCG/ACT nasal spray Place 1 spray into both nostrils daily as needed for allergies.  03/24/14  Yes Historical Provider, MD  hydrocortisone cream 1 % Apply 1 application topically daily as needed for itching.   Yes Historical Provider, MD  LYRICA 75 MG capsule Take 75 mg by mouth 3 (three) times daily. 12/14/13  Yes Historical Provider, MD  magnesium oxide (MAG-OX) 400 MG tablet Take 400 mg by mouth daily.   Yes Historical Provider, MD  Melatonin 3 MG CAPS Take 3 mg by mouth at bedtime.   Yes Historical Provider, MD  metoprolol (TOPROL-XL) 200 MG 24 hr tablet Take 200 mg by mouth daily. Take along with 50 mg tablet to equal 200 mg.   Yes Historical Provider, MD  metoprolol succinate (TOPROL-XL) 100 MG 24 hr tablet Take 1 tablet (100 mg total) by mouth daily. Take with or immediately following a meal. 08/27/14  Yes [name], MD  metoprolol succinate (TOPROL-XL) 50 MG 24 hr tablet Take 50 mg by mouth daily. Take along with 200 mg tablet to equal 250 mg daily.   Yes Historical Provider, MD  montelukast (SINGULAIR) 10 MG tablet Take 10 mg by mouth at bedtime.     Yes Historical Provider,  MD  Multiple Vitamins-Minerals (MULTIVITAMINS THER. W/MINERALS) TABS Take 1 tablet by mouth daily.     Yes Historical Provider, MD  oxyCODONE-acetaminophen (PERCOCET) 10-325 MG per tablet Take 1 tablet by mouth every 6 (six) hours as needed for pain. 11/03/13  Yes Catherine Muskrat, MD  polyethylene glycol [facility] / GLYCOLAX) packet Take 17 g by mouth daily as needed (constipation).    Yes Historical Provider, MD  PROAIR HFA 108 (90 BASE) MCG/ACT inhaler Take 2 puffs by mouth every 6 (six) hours as needed. 04/11/14  Yes Historical Provider, MD  sertraline (ZOLOFT) 50 MG tablet Take 100 mg by mouth at bedtime.   Yes Historical Provider, MD  clonazePAM (KLONOPIN) 0.5 MG  tablet Take 1 tablet by mouth twice daily x 30 days. (use for Klonopin) Patient not taking: Reported on 12/04/2014 10/08/12   Catherine L Reed, DO  sodium bicarbonate 650 MG tablet Take 1 tablet (650 mg total) by mouth 2 (two) times daily. Patient not taking: Reported on 12/04/2014 08/27/14   [name], MD      ANALYSIS: A 16 channel recording using standard 10 20 measurements is conducted for 20 minutes. The background activity is as high as 6 Hz bilaterally. There is significant myogenic artifact seen throughout the recording. There is higher beta activity seen in the frontal areas. Photic stimulation and hyperventilation are not carried out. There is no focal or lateralized slowing. There is no epileptiform activity is observed.   IMPRESSION: This recording shows moderate to severe global slowing indicating a moderate to severe global encephalopathy. However, there is no epileptiform activity observed.      Catherine Mccullough, M.D.  Diplomate, Tax adviser of Psychiatry and Neurology ( Neurology).

## 2014-12-05 NOTE — Progress Notes (Signed)
[place] NOTE   Pharmacy Consult for TPN Indication: prolonged NPO, s/p Mallory Weiss tear with no NG tube for 3 days  Allergies  Allergen Reactions  . Mometasone Shortness Of Breath  . Vancomycin Itching  . Aspirin Other (See Comments)    "Inflames stomach"  . Contrast Media [Iodinated Diagnostic Agents] Other (See Comments)    Made Heart Stop.   . Cortisone Other (See Comments)    Hold fluid  . Doxycycline Nausea And Vomiting  . Ibuprofen Nausea And Vomiting  . Insulins Other (See Comments)    Pt says even the tiniest bit of insulin makes her go unconscious because her BS gets too low  . Lasix [Furosemide] Other (See Comments)    Paradoxical Response  . Other     Iv bp med unknown.and adhesive tape-silicones  . Prednisone     Sweating   . Sulfamethoxazole Other (See Comments)    Bottomed out platelets  . Tape   . Ultram [Tramadol Hcl] Other (See Comments)    "Grand mal seizure"  . Codeine Rash  . Dilantin [Phenytoin Sodium] Rash  . Latex Rash  . Piperacillin Sod-Tazobactam So Rash    Patient Measurements: Height: 4' 7"  (139.7 cm) Weight: 114 lb 13.8 oz (52.1 kg) IBW/kg (Calculated) : 34  Vital Signs: Temp: 99.9 F (37.7 C) (11/29 0645) BP: 112/64 mmHg (11/29 0630) Pulse Rate: 129 (11/29 0645) Intake/Output from previous day: 11/28 0701 - 11/29 0700 In: 6096.2 [I.V.:2486.2; Blood:700; IV Piggyback:2910] Out: 901 [Urine:900; Stool:1] Intake/Output from this shift:    Labs:  Recent Labs  12/03/14 2105 12/04/14 0115 12/04/14 1025 12/05/14 0430  WBC  --  2.1* 2.7* 5.5  HGB  --  8.3* 8.5* 10.2*  HCT  --  23.7* 24.1* 29.1*  PLT  --  102* 86* 47*  APTT 47*  --  55*  --   INR 1.54*  --  1.63*  --      Recent Labs  12/03/14 1100 12/04/14 0115 12/04/14 0450 12/04/14 1025 12/05/14 0430 12/05/14 1034  NA 147* 149*  --  148* 146* 147*  K 5.2* 2.4*  --  2.7* 2.8* 3.1*  CL 124* 128*  --  125* 119* 119*  CO2 5* 10*  --  13* 17*  15*  GLUCOSE 115* 126*  --  139* 129* 101*  BUN 66* 56*  --  59* 47* 46*  CREATININE 2.47* 2.06*  --  2.13* 1.81* 1.77*  CALCIUM 10.6* 7.3*  --  7.1* 6.4* 6.5*  MG  --   --  1.9  --  1.3*  --   PHOS  --   --   --   --  1.0*  --   PROT 8.3* 5.3*  --   --  4.8*  --   ALBUMIN 4.5 2.7*  --   --  2.3*  --   AST 40 45*  --   --  61*  --   ALT 20 18  --   --  22  --   ALKPHOS 107 85  --   --  98  --   BILITOT 0.8 0.8  --   --  1.3*  --   TRIG  --   --   --   --  430*  --    Estimated Creatinine Clearance: 19.8 mL/min (by C-G formula based on Cr of 1.77).    Recent Labs  12/03/14 1042 12/05/14 1056  GLUCAP 97 85  Medical History: Past Medical History  Diagnosis Date  . Kidney disease   . Hypertension   . Diabetes mellitus   . Cancer ([place])     brest met to bone  . Arthritis   . CHF (congestive heart failure) ([place])   . Lymphedema of leg   . Anxiety   . Pulmonary hypertension ([place])   . 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 Pancreatitis   . Depression   . Anemia   . Shingles   . PUD (peptic ulcer disease)   . GERD (gastroesophageal reflux disease)   . MRSA (methicillin resistant staph aureus) culture positive     Insulin Requirements in the past 24 hours:  none   Current Nutrition:  none  Assessment:  Pt is admitted for being down with unclear etiology and unknown how long, hypothermic, metabolic acidosis with incomplete respiratory compensation, hypotensive, hypovolemic, seizure, and septic. She developed upper GI Bleed and has no further evidence of bleeding. She will start TPN for prolonged NPO.  Electrolytes replaced this am.  Inablility to customize TPN formula with Clinimix so will start slowly.  Nutritional Goals:  Per RD 1424 Kcal and 78 g protein  Plan:  Will start Clinimix e 5/15 at 20 ml/hr Will add trace elements and MVI No lipids for now F/u am labs  Thanks for allowing pharmacy to be a part of this patient's care.  Excell Seltzer, PharmD Clinical Pharmacist 12/05/2014,12:18 PM

## 2014-12-05 NOTE — Progress Notes (Signed)
[facility] PROGRESS NOTE  BARBA SOLT QQV:[id number] DOB: 1945-10-19    PCP:   Default, Provider, MD   HPI:  Catherine Mccullough is an 69 y.o. female admitted with hx of CKD4, HTN, breast cancer with bone mets, DM, pulm HTN, admitted for being down with unclear etiology and unknown how long, hypothermic, metabolic acidosis with incomplete respiratory compensation, hypotensive, hypovolemic, seizure, and septic. She developed upper GI Bleed found by EGD at bedside yesterday by Dr Eden Lathe to be MW tear, clipped and has no further evidence of bleeding. She is improving and was able to respond by shaking her head yes and no.  She is now off pressor, and her metabolic derrangement have been corrected.  Her renal Fx is improving. She is still hypokalemic, and still hypocalcemic.  Neurology saw her, concerned about seizure, and continued her [place].  EEG and serologies will be ordered and followed up by Dr [name].  We need to start her TPN today.   Rewiew of Systems: Unable.   Past Medical History  Diagnosis Date  . Kidney disease   . Hypertension   . Diabetes mellitus   . Cancer ([place])     brest met to bone  . Arthritis   . CHF (congestive heart failure) ([place])   . Lymphedema of leg   . Anxiety   . Pulmonary hypertension ([place])   . 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 Pancreatitis   . Depression   . Anemia   . Shingles   . PUD (peptic ulcer disease)   . GERD (gastroesophageal reflux disease)   . MRSA (methicillin resistant staph aureus) culture positive     Past Surgical History  Procedure Laterality Date  . Breast surgery    . Cholecystectomy    . Abdominal hysterectomy    . Replacement total knee bilateral    . Hand tendon surgery      Medications:  HOME MEDS: Prior to Admission medications   Medication Sig Start Date End Date Taking? Authorizing Provider  aspirin EC 81 MG tablet Take 81 mg by mouth daily.   Yes Historical Provider, MD   bisacodyl (DULCOLAX) 5 MG EC tablet Take 5 mg by mouth daily as needed for moderate constipation.   Yes Historical Provider, MD  bumetanide (BUMEX) 1 MG tablet Take 1 mg by mouth daily.    Yes Historical Provider, MD  calcitRIOL (ROCALTROL) 0.25 MCG capsule Take 0.25 mcg by mouth every other day.   Yes Historical Provider, MD  cetirizine (ZYRTEC) 10 MG tablet Take 5 mg by mouth daily as needed for allergies.   Yes Historical Provider, MD  diphenhydramine-acetaminophen (TYLENOL PM) 25-500 MG TABS tablet Take 1 tablet by mouth at bedtime as needed (sleep).   Yes Historical Provider, MD  docusate sodium (COLACE) 100 MG capsule Take 100 mg by mouth 2 (two) times daily.   Yes Historical Provider, MD  esomeprazole (NEXIUM) 40 MG capsule Take 40 mg by mouth daily before breakfast.     Yes Historical Provider, MD  fluticasone (FLONASE) 50 MCG/ACT nasal spray Place 1 spray into both nostrils daily as needed for allergies.  03/24/14  Yes Historical Provider, MD  hydrocortisone cream 1 % Apply 1 application topically daily as needed for itching.   Yes Historical Provider, MD  LYRICA 75 MG capsule Take 75 mg by mouth 3 (three) times daily. 12/14/13  Yes Historical Provider, MD  magnesium oxide (MAG-OX) 400 MG tablet Take 400 mg  by mouth daily.   Yes Historical Provider, MD  Melatonin 3 MG CAPS Take 3 mg by mouth at bedtime.   Yes Historical Provider, MD  metoprolol (TOPROL-XL) 200 MG 24 hr tablet Take 200 mg by mouth daily. Take along with 50 mg tablet to equal 200 mg.   Yes Historical Provider, MD  metoprolol succinate (TOPROL-XL) 100 MG 24 hr tablet Take 1 tablet (100 mg total) by mouth daily. Take with or immediately following a meal. 08/27/14  Yes [name], MD  metoprolol succinate (TOPROL-XL) 50 MG 24 hr tablet Take 50 mg by mouth daily. Take along with 200 mg tablet to equal 250 mg daily.   Yes Historical Provider, MD  montelukast (SINGULAIR) 10 MG tablet Take 10 mg by mouth at bedtime.     Yes  Historical Provider, MD  Multiple Vitamins-Minerals (MULTIVITAMINS THER. W/MINERALS) TABS Take 1 tablet by mouth daily.     Yes Historical Provider, MD  oxyCODONE-acetaminophen (PERCOCET) 10-325 MG per tablet Take 1 tablet by mouth every 6 (six) hours as needed for pain. 11/03/13  Yes Carmin Muskrat, MD  polyethylene glycol [facility] / GLYCOLAX) packet Take 17 g by mouth daily as needed (constipation).    Yes Historical Provider, MD  PROAIR HFA 108 (90 BASE) MCG/ACT inhaler Take 2 puffs by mouth every 6 (six) hours as needed. 04/11/14  Yes Historical Provider, MD  sertraline (ZOLOFT) 50 MG tablet Take 100 mg by mouth at bedtime.   Yes Historical Provider, MD  clonazePAM (KLONOPIN) 0.5 MG tablet Take 1 tablet by mouth twice daily x 30 days. (use for Klonopin) Patient not taking: Reported on 12/04/2014 10/08/12   [name], DO  sodium bicarbonate 650 MG tablet Take 1 tablet (650 mg total) by mouth 2 (two) times daily. Patient not taking: Reported on 12/04/2014 08/27/14   [name], MD     Allergies:  Allergies  Allergen Reactions  . Mometasone Shortness Of Breath  . Vancomycin Itching  . Aspirin Other (See Comments)    "Inflames stomach"  . Contrast Media [Iodinated Diagnostic Agents] Other (See Comments)    Made Heart Stop.   . Cortisone Other (See Comments)    Hold fluid  . Doxycycline Nausea And Vomiting  . Ibuprofen Nausea And Vomiting  . Insulins Other (See Comments)    Pt says even the tiniest bit of insulin makes her go unconscious because her BS gets too low  . Lasix [Furosemide] Other (See Comments)    Paradoxical Response  . Other     Iv bp med unknown.and adhesive tape-silicones  . Prednisone     Sweating   . Sulfamethoxazole Other (See Comments)    Bottomed out platelets  . Tape   . Ultram [Tramadol Hcl] Other (See Comments)    "Grand mal seizure"  . Codeine Rash  . Dilantin [Phenytoin Sodium] Rash  . Latex Rash  . Piperacillin Sod-Tazobactam So Rash     Social History:   reports that she has never smoked. She has never used smokeless tobacco. She reports that she does not drink alcohol or use illicit drugs.  Family History: Family History  Problem Relation Age of Onset  . Cancer Paternal Grandfather   . Diabetes Sister   . Diabetes Sister   . Heart disease Maternal Grandmother   . Heart disease Father   . Hyperlipidemia       Physical Exam: Filed Vitals:   12/05/14 0615 12/05/14 0630 12/05/14 0645 12/05/14 0816  BP:  112/64    Pulse: 128 119 129   Temp: 99.3 F (37.4 C) 99.1 F (37.3 C) 99.9 F (37.7 C)   TempSrc:      Resp: 26 20 18    Height:    4' 7"  (1.397 m)  Weight:      SpO2: 100% 100% 100%    Blood pressure 112/64, pulse 129, temperature 99.9 F (37.7 C), temperature source Core (Comment), resp. rate 18, height 4' 7"  (1.397 m), weight 52.1 kg (114 lb 13.8 oz), SpO2 100 %.  GEN: patient lying in the stretcher in no acute distress; intubated and mostly sedated.  HEENT: Mucous membranes pink and anicteric; PERRLA; EOM intact; no cervical lymphadenopathy nor thyromegaly or carotid bruit; no JVD; There were no stridor. Neck is very supple. Breasts:: Not examined CHEST WALL: No tenderness CHEST: mechanically ventilated.  HEART: Regular rate and rhythm.  SEM LSB.  Tachy. BACK: No kyphosis or scoliosis; no CVA tenderness ABDOMEN: soft and non-tender; no masses, no organomegaly, absence of BS. no pannus; no intertriginous candida. There is no rebound and no distention. Rectal Exam: Not done EXTREMITIES: No bone or joint deformity; age-appropriate arthropathy of the hands and knees; no edema; no ulcerations.  There is no calf tenderness. Genitalia: not examined PULSES: 2+ and symmetric SKIN: Normal hydration no rash or ulceration CNS: She did respond to question to her friend.  Head shakes to simple questions.   Labs on Admission:  Basic Metabolic Panel:  Recent Labs Lab 12/03/14 1100 12/04/14 0115  12/04/14 0450 12/04/14 1025 12/05/14 0430  NA 147* 149*  --  148* 146*  K 5.2* 2.4*  --  2.7* 2.8*  CL 124* 128*  --  125* 119*  CO2 5* 10*  --  13* 17*  GLUCOSE 115* 126*  --  139* 129*  BUN 66* 56*  --  59* 47*  CREATININE 2.47* 2.06*  --  2.13* 1.81*  CALCIUM 10.6* 7.3*  --  7.1* 6.4*  MG  --   --  1.9  --  1.3*  PHOS  --   --   --   --  1.0*   Liver Function Tests:  Recent Labs Lab 12/03/14 1100 12/04/14 0115 12/05/14 0430  AST 40 45* 61*  ALT 20 18 22   ALKPHOS 107 85 98  BILITOT 0.8 0.8 1.3*  PROT 8.3* 5.3* 4.8*  ALBUMIN 4.5 2.7* 2.3*   No results for input(s): LIPASE, AMYLASE in the last 168 hours.  Recent Labs Lab 12/03/14 1100  AMMONIA 40*   CBC:  Recent Labs Lab 12/03/14 1100 12/04/14 0115 12/04/14 1025 12/05/14 0430  WBC 18.6* 2.1* 2.7* 5.5  NEUTROABS 17.0* 1.9  --  4.8  HGB 13.7 8.3* 8.5* 10.2*  HCT 40.5 23.7* 24.1* 29.1*  MCV 104.1* 100.4* 98.8 90.7  PLT 205 102* 86* 47*   Cardiac Enzymes:  Recent Labs Lab 12/03/14 1200  CKTOTAL 356*    CBG:  Recent Labs Lab 12/03/14 1042  GLUCAP 97     Radiological Exams on Admission: Ct Head Wo Contrast  12/03/2014  CLINICAL DATA:  69 year old female found down by family. Uncertain how long patient has been down (last contact with family members was 3 days ago). EXAM: CT HEAD WITHOUT CONTRAST TECHNIQUE: Contiguous axial images were obtained from the base of the skull through the vertex without intravenous contrast. COMPARISON:  Head CT 04/26/2014. FINDINGS: Low-attenuation in the inferior aspect of the right frontal lobe, mild low-attenuation in the inferior aspect of the medial  left frontal lobe, and low-attenuation in the anterior aspect of the right temporal lobe, all similar to the prior examination, likely to reflect gliosis from remote trauma. Physiologic calcifications in the basal ganglia bilaterally. No acute intracranial abnormalities. Specifically, no evidence of acute intracranial  hemorrhage, no definite findings of acute/subacute cerebral ischemia, no mass, mass effect, hydrocephalus or abnormal intra or extra-axial fluid collections. Visualized paranasal sinuses and mastoids are well pneumatized. No acute displaced skull fractures are identified. IMPRESSION: 1. No acute intracranial abnormalities. 2. Areas of probable gliosis in the frontal lobes bilaterally (right greater than left) and anterior aspect of the right temporal lobe, unchanged compared to the prior study, likely related to remote trauma. Electronically Signed   By: Vinnie Langton M.D.   On: 12/03/2014 13:07   Dg Chest Port 1 View  12/05/2014  CLINICAL DATA:  Hypoxia EXAM: PORTABLE CHEST 1 VIEW COMPARISON:  December 04, 2014 FINDINGS: Endotracheal tube tip is 2.1 cm above the carina. No pneumothorax. There is a minimal right pleural effusion. There is slight bibasilar atelectasis. Lungs elsewhere clear. Heart size and pulmonary vascularity are normal. No adenopathy. A surgical clip is again noted at the level of the aortopulmonary window. IMPRESSION: Endotracheal tube as described without pneumothorax. Slight bibasilar atelectasis. Minimal right pleural effusion. No airspace consolidation appreciable. Electronically Signed   By: Lowella Grip III M.D.   On: 12/05/2014 08:05   Dg Chest Port 1 View  12/04/2014  CLINICAL DATA:  Sepsis EXAM: PORTABLE CHEST 1 VIEW COMPARISON:  12/03/2014 FINDINGS: Endotracheal tube tip is 3.5 cm above the carina. There is improvement, with partial clearance of basilar opacities, now with better definition of the diaphragmatic contours. No pneumothorax. IMPRESSION: Satisfactory ET tube position. Partial clearance of basilar opacities. Electronically Signed   By: Andreas Newport M.D.   On: 12/04/2014 06:43   Dg Chest Portable 1 View  12/03/2014  CLINICAL DATA:  Intubation. EXAM: PORTABLE CHEST 1 VIEW COMPARISON:  12/03/2014 FINDINGS: The endotracheal tube tip is 1.8 cm above the  carina. Streaky opacities persist in both bases. No large effusions. No pneumothorax. IMPRESSION: Satisfactory ET tube position. No other significant interval change. Electronically Signed   By: Andreas Newport M.D.   On: 12/03/2014 21:26   Dg Chest Port 1 View  12/03/2014  CLINICAL DATA:  Follow-up right subclavian central line attempt EXAM: PORTABLE CHEST 1 VIEW COMPARISON:  Prior film same day FINDINGS: Cardiomediastinal silhouette is stable. Persistent streaky right basilar atelectasis or infiltrate. There is no pneumothorax. Degenerative changes right shoulder. No pulmonary edema. Moderate gaseous distension of the stomach. IMPRESSION: Persistent streaky right basilar atelectasis or infiltrate. No pneumothorax. Degenerative changes right shoulder. Electronically Signed   By: Lahoma Crocker M.D.   On: 12/03/2014 15:58   Dg Chest Port 1 View  12/03/2014  CLINICAL DATA:  Found unresponsive on the floor, history of breast cancer, hyponatremia EXAM: PORTABLE CHEST 1 VIEW COMPARISON:  08/21/2014 FINDINGS: Cardiomediastinal silhouette is unremarkable. There is streaky right basilar atelectasis or infiltrate. Moderate gaseous distension of the stomach. No pulmonary edema. IMPRESSION: Streaky right basilar atelectasis or infiltrate. Aspiration cannot be excluded. Moderate gastric distension of the stomach. No pulmonary edema. Electronically Signed   By: Lahoma Crocker M.D.   On: 12/03/2014 13:34   Assessment/Plan Present on Admission:  . Altered mental status . Breast cancer metastasized to bone ([place]) . CKD (chronic kidney disease) stage 4, GFR 15-29 ml/min ([facility]) . Dehydration   PLAN: I spoke with her entire family yesterday afternoon.  Updated progress and concerns.  Her son Carleene Overlie is the POA/HCP.  He feels that the current therapy is good, but he expressed a possibility that he may want to de-escalate care and keep her comfortable only, but he is still hopeful she will improve.     Therefore:  Hypothermia: She is now normothermic.  Hypotensive: Improved, blood was given and BP held stable without pressor. CKD: Stable. Cr is improving.  It is now 1.8 Sepsis: BC, urine culture, and spinal fluid cultured. Continue Zyvox, Atreonam, and Levoquin.  Respiratory failure: Will remain on ventilator today. ABG with pH of 7.4 Nutrition: we will need to start TPN today.  Suspect we cannot use her gut for several days at least.  Anemia; Hb of 10 g/dL,  S/p 2 units of PRBCs. Possible Seizure:  On Keppra, neurology will follow up on this.  She is responding to simple questions.  Other plans as per orders.  Code Status: FULL Haskel Khan, MD. [facility] Pager [phone] 7pm to 7am.  12/05/2014, 9:27 AM

## 2014-12-06 ENCOUNTER — Inpatient Hospital Stay (HOSPITAL_COMMUNITY): Payer: Medicare Other

## 2014-12-06 DIAGNOSIS — Z7189 Other specified counseling: Secondary | ICD-10-CM | POA: Diagnosis present

## 2014-12-06 DIAGNOSIS — I4891 Unspecified atrial fibrillation: Secondary | ICD-10-CM

## 2014-12-06 DIAGNOSIS — Z515 Encounter for palliative care: Secondary | ICD-10-CM | POA: Diagnosis present

## 2014-12-06 DIAGNOSIS — J9601 Acute respiratory failure with hypoxia: Secondary | ICD-10-CM | POA: Diagnosis present

## 2014-12-06 DIAGNOSIS — R40243 Glasgow coma scale score 3-8, unspecified time: Secondary | ICD-10-CM

## 2014-12-06 DIAGNOSIS — J9600 Acute respiratory failure, unspecified whether with hypoxia or hypercapnia: Secondary | ICD-10-CM

## 2014-12-06 DIAGNOSIS — G9340 Encephalopathy, unspecified: Secondary | ICD-10-CM | POA: Diagnosis present

## 2014-12-06 DIAGNOSIS — G40901 Epilepsy, unspecified, not intractable, with status epilepticus: Secondary | ICD-10-CM | POA: Diagnosis present

## 2014-12-06 LAB — COMPREHENSIVE METABOLIC PANEL
ALK PHOS: 114 U/L (ref 38–126)
ALT: 22 U/L (ref 14–54)
AST: 44 U/L — ABNORMAL HIGH (ref 15–41)
Albumin: 2.3 g/dL — ABNORMAL LOW (ref 3.5–5.0)
Anion gap: 6 (ref 5–15)
BUN: 37 mg/dL — ABNORMAL HIGH (ref 6–20)
CALCIUM: 6.6 mg/dL — AB (ref 8.9–10.3)
CO2: 16 mmol/L — ABNORMAL LOW (ref 22–32)
CREATININE: 1.49 mg/dL — AB (ref 0.44–1.00)
Chloride: 118 mmol/L — ABNORMAL HIGH (ref 101–111)
GFR, EST AFRICAN AMERICAN: 40 mL/min — AB (ref >60)
GFR, EST NON AFRICAN AMERICAN: 35 mL/min — AB (ref >60)
Glucose, Bld: 136 mg/dL — ABNORMAL HIGH (ref 65–99)
Potassium: 4.1 mmol/L (ref 3.5–5.1)
Sodium: 140 mmol/L (ref 135–145)
TOTAL PROTEIN: 4.9 g/dL — AB (ref 6.5–8.1)
Total Bilirubin: 1.2 mg/dL (ref 0.3–1.2)

## 2014-12-06 LAB — BLOOD GAS, ARTERIAL
ACID-BASE DEFICIT: 10.5 mmol/L — AB (ref 0.0–2.0)
BICARBONATE: 16.8 meq/L — AB (ref 20.0–24.0)
Drawn by: 317771
FIO2: 0.35
LHR: 20 {breaths}/min
MECHVT: 500 mL
O2 Saturation: 99.5 %
PEEP/CPAP: 5 cmH2O
pCO2 arterial: 21.1 mmHg — ABNORMAL LOW (ref 35.0–45.0)
pH, Arterial: 7.414 (ref 7.350–7.450)
pO2, Arterial: 182 mmHg — ABNORMAL HIGH (ref 80.0–100.0)

## 2014-12-06 LAB — GLUCOSE, CAPILLARY
GLUCOSE-CAPILLARY: 127 mg/dL — AB (ref 65–99)
GLUCOSE-CAPILLARY: 125 mg/dL — AB (ref 65–99)
GLUCOSE-CAPILLARY: 196 mg/dL — AB (ref 65–99)
Glucose-Capillary: 112 mg/dL — ABNORMAL HIGH (ref 65–99)
Glucose-Capillary: 173 mg/dL — ABNORMAL HIGH (ref 65–99)
Glucose-Capillary: 192 mg/dL — ABNORMAL HIGH (ref 65–99)

## 2014-12-06 LAB — CBC
HCT: 26.7 % — ABNORMAL LOW (ref 36.0–46.0)
Hemoglobin: 9.3 g/dL — ABNORMAL LOW (ref 12.0–15.0)
MCH: 31.8 pg (ref 26.0–34.0)
MCHC: 34.8 g/dL (ref 30.0–36.0)
MCV: 91.4 fL (ref 78.0–100.0)
PLATELETS: 35 10*3/uL — AB (ref 150–400)
RBC: 2.92 MIL/uL — AB (ref 3.87–5.11)
RDW: 22.7 % — ABNORMAL HIGH (ref 11.5–15.5)
WBC: 6.8 10*3/uL (ref 4.0–10.5)

## 2014-12-06 LAB — HIV ANTIBODY (ROUTINE TESTING W REFLEX): HIV Screen 4th Generation wRfx: NONREACTIVE

## 2014-12-06 LAB — PHOSPHORUS

## 2014-12-06 LAB — TSH: TSH: 1.388 u[IU]/mL (ref 0.350–4.500)

## 2014-12-06 LAB — HOMOCYSTEINE: Homocysteine: 3.8 umol/L (ref 0.0–15.0)

## 2014-12-06 LAB — MAGNESIUM: MAGNESIUM: 1.7 mg/dL (ref 1.7–2.4)

## 2014-12-06 LAB — RPR: RPR: NONREACTIVE

## 2014-12-06 MED ORDER — MAGNESIUM SULFATE IN D5W 10-5 MG/ML-% IV SOLN
1.0000 g | Freq: Once | INTRAVENOUS | Status: AC
  Administered 2014-12-06: 1 g via INTRAVENOUS
  Filled 2014-12-06: qty 100

## 2014-12-06 MED ORDER — FENTANYL CITRATE (PF) 100 MCG/2ML IJ SOLN
50.0000 ug | Freq: Once | INTRAMUSCULAR | Status: AC
  Administered 2014-12-06: 50 ug via INTRAVENOUS

## 2014-12-06 MED ORDER — DILTIAZEM HCL 100 MG IV SOLR
5.0000 mg/h | INTRAVENOUS | Status: DC
  Administered 2014-12-06: 7.5 mg/h via INTRAVENOUS
  Administered 2014-12-06: 5 mg/h via INTRAVENOUS
  Administered 2014-12-07: 7.5 mg/h via INTRAVENOUS
  Administered 2014-12-07: 10 mg/h via INTRAVENOUS
  Administered 2014-12-08 – 2014-12-09 (×4): 7.5 mg/h via INTRAVENOUS
  Filled 2014-12-06 (×8): qty 100

## 2014-12-06 MED ORDER — CALCIUM GLUCONATE 10 % IV SOLN
2.0000 g | Freq: Once | INTRAVENOUS | Status: AC
  Administered 2014-12-06: 2 g via INTRAVENOUS
  Filled 2014-12-06: qty 20

## 2014-12-06 MED ORDER — DILTIAZEM LOAD VIA INFUSION
15.0000 mg | Freq: Once | INTRAVENOUS | Status: DC
  Filled 2014-12-06: qty 15

## 2014-12-06 MED ORDER — K PHOS MONO-SOD PHOS DI & MONO 155-852-130 MG PO TABS
250.0000 mg | ORAL_TABLET | Freq: Two times a day (BID) | ORAL | Status: DC
  Filled 2014-12-06 (×5): qty 1

## 2014-12-06 MED ORDER — FENTANYL BOLUS VIA INFUSION
25.0000 ug | INTRAVENOUS | Status: DC | PRN
  Administered 2014-12-08 – 2014-12-09 (×2): 25 ug via INTRAVENOUS
  Filled 2014-12-06: qty 25

## 2014-12-06 MED ORDER — SODIUM CHLORIDE 0.9 % IV SOLN
25.0000 ug/h | INTRAVENOUS | Status: DC
  Administered 2014-12-06 – 2014-12-07 (×2): 50 ug/h via INTRAVENOUS
  Administered 2014-12-08: 75 ug/h via INTRAVENOUS
  Filled 2014-12-06 (×3): qty 50

## 2014-12-06 MED ORDER — POTASSIUM PHOSPHATES 15 MMOLE/5ML IV SOLN
20.0000 mmol | Freq: Once | INTRAVENOUS | Status: AC
  Administered 2014-12-06: 20 mmol via INTRAVENOUS
  Filled 2014-12-06: qty 6.67

## 2014-12-06 MED ORDER — MAGNESIUM SULFATE 2 GM/50ML IV SOLN: 2.0000 g | Freq: Once | INTRAVENOUS | Status: DC

## 2014-12-06 MED ORDER — DILTIAZEM HCL 25 MG/5ML IV SOLN
INTRAVENOUS | Status: AC
  Administered 2014-12-06: 15 mg
  Filled 2014-12-06: qty 5

## 2014-12-06 MED ORDER — TRACE MINERALS CR-CU-MN-SE-ZN 10-1000-500-60 MCG/ML IV SOLN
INTRAVENOUS | Status: AC
  Administered 2014-12-06: 18:00:00 via INTRAVENOUS
  Filled 2014-12-06: qty 960

## 2014-12-06 NOTE — Progress Notes (Signed)
Episodes noted patient restless and trying to remove ETT tube. Fentanyl drip started.

## 2014-12-06 NOTE — Progress Notes (Signed)
Palliative nurse called me about attempting to wean again.  Attempted with cpap 5/5 but patient RR increased >35bpm and seemed somewhat agitated during the weaning process.  Attempted to increase pressure support but was unable to get her calmed back down.  Placed back on rest mode.  Will attempt in am.

## 2014-12-06 NOTE — Progress Notes (Signed)
Subjective: She is awake and alert this morning. She is requesting the endotracheal tube be removed. She has been able to go down to 35% oxygen.  Objective: Vital signs in last 24 hours: Temp:  [97.9 F (36.6 C)-100.2 F (37.9 C)] 97.9 F (36.6 C) (11/30 0700) Pulse Rate:  [95-129] 98 (11/30 0700) Resp:  [5-25] 15 (11/30 0700) BP: (81-125)/(52-99) 121/68 mmHg (11/30 0700) SpO2:  [100 %] 100 % (11/30 0700) FiO2 (%):  [35 %] 35 % (11/30 0245) Weight:  [60.5 kg (133 lb 6.1 oz)] 60.5 kg (133 lb 6.1 oz) (11/30 0500) Weight change: 8.4 kg (18 lb 8.3 oz) Last BM Date: 12/05/14  Intake/Output from previous day: 11/29 0701 - 11/30 0700 In: 400 [IV Piggyback:400] Out: 750 [Urine:750]  PHYSICAL EXAM General appearance: alert and Intubated. She is on mechanical ventilation. Her sedation is being reduced Resp: rhonchi bilaterally Cardio: regular rate and rhythm, S1, S2 normal, no murmur, click, rub or gallop GI: soft, non-tender; bowel sounds normal; no masses,  no organomegaly Extremities: extremities normal, atraumatic, no cyanosis or edema  Lab Results:  Results for orders placed or performed during the hospital encounter of 12/03/14 (from the past 48 hour(s))  Basic metabolic panel     Status: Abnormal   Collection Time: 12/04/14 10:25 AM  Result Value Ref Range   Sodium 148 (H) 135 - 145 mmol/L   Potassium 2.7 (LL) 3.5 - 5.1 mmol/L    Comment: CRITICAL RESULT CALLED TO, READ BACK BY AND VERIFIED WITH: ROWE,N AT 11:05AM ON 12/04/14 BY FESTERMAN,C    Chloride 125 (H) 101 - 111 mmol/L   CO2 13 (L) 22 - 32 mmol/L   Glucose, Bld 139 (H) 65 - 99 mg/dL   BUN 59 (H) 6 - 20 mg/dL   Creatinine, Ser 2.13 (H) 0.44 - 1.00 mg/dL   Calcium 7.1 (L) 8.9 - 10.3 mg/dL   GFR calc non Af Amer 23 (L) >60 mL/min   GFR calc Af Amer 26 (L) >60 mL/min    Comment: (NOTE) The eGFR has been calculated using the CKD EPI equation. This calculation has not been validated in all clinical  situations. eGFR's persistently <60 mL/min signify possible Chronic Kidney Disease.    Anion gap 10 5 - 15  CBC     Status: Abnormal   Collection Time: 12/04/14 10:25 AM  Result Value Ref Range   WBC 2.7 (L) 4.0 - 10.5 K/uL   RBC 2.44 (L) 3.87 - 5.11 MIL/uL   Hemoglobin 8.5 (L) 12.0 - 15.0 g/dL   HCT 24.1 (L) 36.0 - 46.0 %   MCV 98.8 78.0 - 100.0 fL   MCH 34.8 (H) 26.0 - 34.0 pg   MCHC 35.3 30.0 - 36.0 g/dL   RDW 21.3 (H) 11.5 - 15.5 %   Platelets 86 (L) 150 - 400 K/uL    Comment: SPECIMEN CHECKED FOR CLOTS PLATELET COUNT CONFIRMED BY SMEAR   Protime-INR     Status: Abnormal   Collection Time: 12/04/14 10:25 AM  Result Value Ref Range   Prothrombin Time 19.4 (H) 11.6 - 15.2 seconds   INR 1.63 (H) 0.00 - 1.49  APTT     Status: Abnormal   Collection Time: 12/04/14 10:25 AM  Result Value Ref Range   aPTT 55 (H) 24 - 37 seconds    Comment:        IF BASELINE aPTT IS ELEVATED, SUGGEST PATIENT RISK ASSESSMENT BE USED TO DETERMINE APPROPRIATE ANTICOAGULANT THERAPY.   Prepare RBC  Status: None   Collection Time: 12/04/14 10:42 AM  Result Value Ref Range   Order Confirmation ORDER PROCESSED BY BLOOD BANK   Comprehensive metabolic panel     Status: Abnormal   Collection Time: 12/05/14  4:30 AM  Result Value Ref Range   Sodium 146 (H) 135 - 145 mmol/L   Potassium 2.8 (L) 3.5 - 5.1 mmol/L   Chloride 119 (H) 101 - 111 mmol/L   CO2 17 (L) 22 - 32 mmol/L   Glucose, Bld 129 (H) 65 - 99 mg/dL   BUN 47 (H) 6 - 20 mg/dL   Creatinine, Ser 1.81 (H) 0.44 - 1.00 mg/dL   Calcium 6.4 (LL) 8.9 - 10.3 mg/dL    Comment: CRITICAL RESULT CALLED TO, READ BACK BY AND VERIFIED WITH: HAMMOCK,S AT 6:00AM ON 12/05/14 BY FESTERMAN,C    Total Protein 4.8 (L) 6.5 - 8.1 g/dL   Albumin 2.3 (L) 3.5 - 5.0 g/dL   AST 61 (H) 15 - 41 U/L   ALT 22 14 - 54 U/L   Alkaline Phosphatase 98 38 - 126 U/L   Total Bilirubin 1.3 (H) 0.3 - 1.2 mg/dL   GFR calc non Af Amer 28 (L) >60 mL/min   GFR calc Af Amer 32  (L) >60 mL/min    Comment: (NOTE) The eGFR has been calculated using the CKD EPI equation. This calculation has not been validated in all clinical situations. eGFR's persistently <60 mL/min signify possible Chronic Kidney Disease.    Anion gap 10 5 - 15  Magnesium     Status: Abnormal   Collection Time: 12/05/14  4:30 AM  Result Value Ref Range   Magnesium 1.3 (L) 1.7 - 2.4 mg/dL  Phosphorus     Status: Abnormal   Collection Time: 12/05/14  4:30 AM  Result Value Ref Range   Phosphorus 1.0 (LL) 2.5 - 4.6 mg/dL    Comment: CRITICAL RESULT CALLED TO, READ BACK BY AND VERIFIED WITH: HAMMOCK,S AT 6:00AM ON 12/05/14 BY FESTERMAN,C   Triglycerides     Status: Abnormal   Collection Time: 12/05/14  4:30 AM  Result Value Ref Range   Triglycerides 430 (H) <150 mg/dL  Prealbumin     Status: Abnormal   Collection Time: 12/05/14  4:30 AM  Result Value Ref Range   Prealbumin 17.0 (L) 18 - 38 mg/dL    Comment: Performed at [facility]  RPR     Status: None   Collection Time: 12/05/14  4:30 AM  Result Value Ref Range   RPR Ser Ql Non Reactive Non Reactive    Comment: (NOTE) Performed At: [facility] [place], [place] [id number] Lindon Romp MD [id number]   Vitamin B12     Status: Abnormal   Collection Time: 12/05/14  4:30 AM  Result Value Ref Range   Vitamin B-12 1185 (H) 180 - 914 pg/mL    Comment: (NOTE) This assay is not validated for testing neonatal or myeloproliferative syndrome specimens for Vitamin B12 levels. Performed at [facility]   TSH     Status: None   Collection Time: 12/05/14  4:30 AM  Result Value Ref Range   TSH 1.699 0.350 - 4.500 uIU/mL  HIV antibody (routine testing) (NOT for [facility])     Status: None   Collection Time: 12/05/14  4:30 AM  Result Value Ref Range   HIV Screen 4th Generation wRfx Non Reactive Non Reactive    Comment: (NOTE) Performed At: [facility]  [place], [place]  [id number] Lindon Romp MD [id number]   CBC with Differential/Platelet     Status: Abnormal   Collection Time: 12/05/14  4:30 AM  Result Value Ref Range   WBC 5.5 4.0 - 10.5 K/uL   RBC 3.21 (L) 3.87 - 5.11 MIL/uL   Hemoglobin 10.2 (L) 12.0 - 15.0 g/dL   HCT 29.1 (L) 36.0 - 46.0 %   MCV 90.7 78.0 - 100.0 fL    Comment: DELTA CHECK NOTED   MCH 31.8 26.0 - 34.0 pg   MCHC 35.1 30.0 - 36.0 g/dL   RDW 20.3 (H) 11.5 - 15.5 %   Platelets 47 (L) 150 - 400 K/uL   Neutrophils Relative % 87 %   Neutro Abs 4.8 1.7 - 7.7 K/uL   Lymphocytes Relative 7 %   Lymphs Abs 0.4 (L) 0.7 - 4.0 K/uL   Monocytes Relative 4 %   Monocytes Absolute 0.2 0.1 - 1.0 K/uL   Eosinophils Relative 2 %   Eosinophils Absolute 0.1 0.0 - 0.7 K/uL   Basophils Relative 0 %   Basophils Absolute 0.0 0.0 - 0.1 K/uL   WBC Morphology INCREASED BANDS (>20% BANDS)   Blood gas, arterial     Status: Abnormal   Collection Time: 12/05/14  5:40 AM  Result Value Ref Range   FIO2 60.00    Delivery systems VENTILATOR    Mode PRESSURE REGULATED VOLUME CONTROL    VT 500 mL   LHR 14 resp/min   Peep/cpap 5.0 cm H20   pH, Arterial 7.404 7.350 - 7.450   pCO2 arterial 48.2 (H) 35.0 - 45.0 mmHg   pO2, Arterial 141.0 (H) 80.0 - 100.0 mmHg   Bicarbonate 28.6 (H) 20.0 - 24.0 mEq/L   TCO2 13.5 0 - 100 mmol/L   Acid-Base Excess 4.9 (H) 0.0 - 2.0 mmol/L   O2 Saturation 98.7 %   Patient temperature 37.0    Collection site RIGHT RADIAL    Drawn by [id number]    Sample type ARTERIAL    Allens test (pass/fail) PASS PASS  Basic metabolic panel     Status: Abnormal   Collection Time: 12/05/14 10:34 AM  Result Value Ref Range   Sodium 147 (H) 135 - 145 mmol/L   Potassium 3.1 (L) 3.5 - 5.1 mmol/L   Chloride 119 (H) 101 - 111 mmol/L   CO2 15 (L) 22 - 32 mmol/L   Glucose, Bld 101 (H) 65 - 99 mg/dL   BUN 46 (H) 6 - 20 mg/dL   Creatinine, Ser 1.77 (H) 0.44 - 1.00 mg/dL   Calcium 6.5 (L) 8.9 - 10.3 mg/dL   GFR calc non Af Amer 28 (L) >60  mL/min   GFR calc Af Amer 33 (L) >60 mL/min    Comment: (NOTE) The eGFR has been calculated using the CKD EPI equation. This calculation has not been validated in all clinical situations. eGFR's persistently <60 mL/min signify possible Chronic Kidney Disease.    Anion gap 13 5 - 15  Troponin I     Status: Abnormal   Collection Time: 12/05/14 10:34 AM  Result Value Ref Range   Troponin I 0.28 (H) <0.031 ng/mL    Comment:        PERSISTENTLY INCREASED TROPONIN VALUES IN THE RANGE OF 0.04-0.49 ng/mL CAN BE SEEN IN:       -UNSTABLE ANGINA       -CONGESTIVE HEART FAILURE       -MYOCARDITIS       -  CHEST TRAUMA       -ARRYHTHMIAS       -LATE PRESENTING MYOCARDIAL INFARCTION       -COPD   CLINICAL FOLLOW-UP RECOMMENDED.   Glucose, capillary     Status: None   Collection Time: 12/05/14 10:56 AM  Result Value Ref Range   Glucose-Capillary 85 65 - 99 mg/dL   Comment 1 Repeat Test   Glucose, capillary     Status: Abnormal   Collection Time: 12/05/14  4:54 PM  Result Value Ref Range   Glucose-Capillary 115 (H) 65 - 99 mg/dL   Comment 1 Document in Chart   Glucose, capillary     Status: Abnormal   Collection Time: 12/05/14  8:17 PM  Result Value Ref Range   Glucose-Capillary 114 (H) 65 - 99 mg/dL   Comment 1 Notify RN    Comment 2 Document in Chart   Glucose, capillary     Status: Abnormal   Collection Time: 12/06/14 12:24 AM  Result Value Ref Range   Glucose-Capillary 112 (H) 65 - 99 mg/dL  Blood gas, arterial     Status: Abnormal   Collection Time: 12/06/14  3:06 AM  Result Value Ref Range   FIO2 0.35    Delivery systems VENTILATOR    Mode PRESSURE REGULATED VOLUME CONTROL    VT 500 mL   LHR 20.0 resp/min   Peep/cpap 5.0 cm H20   pH, Arterial 7.414 7.350 - 7.450   pCO2 arterial 21.1 (L) 35.0 - 45.0 mmHg   pO2, Arterial 182 (H) 80.0 - 100.0 mmHg   Bicarbonate 16.8 (L) 20.0 - 24.0 mEq/L   Acid-base deficit 10.5 (H) 0.0 - 2.0 mmol/L   O2 Saturation 99.5 %   Collection  site LEFT RADIAL    Drawn by [id number]    Sample type ARTERIAL DRAW    Allens test (pass/fail) PASS PASS  Comprehensive metabolic panel     Status: Abnormal   Collection Time: 12/06/14  4:26 AM  Result Value Ref Range   Sodium 140 135 - 145 mmol/L   Potassium 4.1 3.5 - 5.1 mmol/L    Comment: DELTA CHECK NOTED   Chloride 118 (H) 101 - 111 mmol/L   CO2 16 (L) 22 - 32 mmol/L   Glucose, Bld 136 (H) 65 - 99 mg/dL   BUN 37 (H) 6 - 20 mg/dL   Creatinine, Ser 1.49 (H) 0.44 - 1.00 mg/dL   Calcium 6.6 (L) 8.9 - 10.3 mg/dL   Total Protein 4.9 (L) 6.5 - 8.1 g/dL   Albumin 2.3 (L) 3.5 - 5.0 g/dL   AST 44 (H) 15 - 41 U/L   ALT 22 14 - 54 U/L   Alkaline Phosphatase 114 38 - 126 U/L   Total Bilirubin 1.2 0.3 - 1.2 mg/dL   GFR calc non Af Amer 35 (L) >60 mL/min   GFR calc Af Amer 40 (L) >60 mL/min    Comment: (NOTE) The eGFR has been calculated using the CKD EPI equation. This calculation has not been validated in all clinical situations. eGFR's persistently <60 mL/min signify possible Chronic Kidney Disease.    Anion gap 6 5 - 15  CBC     Status: Abnormal   Collection Time: 12/06/14  4:26 AM  Result Value Ref Range   WBC 6.8 4.0 - 10.5 K/uL   RBC 2.92 (L) 3.87 - 5.11 MIL/uL   Hemoglobin 9.3 (L) 12.0 - 15.0 g/dL   HCT 26.7 (L) 36.0 - 46.0 %  MCV 91.4 78.0 - 100.0 fL   MCH 31.8 26.0 - 34.0 pg   MCHC 34.8 30.0 - 36.0 g/dL   RDW 22.7 (H) 11.5 - 15.5 %   Platelets 35 (L) 150 - 400 K/uL    Comment: SPECIMEN CHECKED FOR CLOTS PLATELET COUNT CONFIRMED BY SMEAR   Phosphorus     Status: Abnormal   Collection Time: 12/06/14  4:26 AM  Result Value Ref Range   Phosphorus <1.0 (LL) 2.5 - 4.6 mg/dL    Comment: CRITICAL RESULT CALLED TO, READ BACK BY AND VERIFIED WITH: HAMMOCK,S AT 7:00AM ON 12/06/14 BY FESTERMAN,C   Magnesium     Status: None   Collection Time: 12/06/14  4:26 AM  Result Value Ref Range   Magnesium 1.7 1.7 - 2.4 mg/dL  Glucose, capillary     Status: Abnormal   Collection  Time: 12/06/14  5:35 AM  Result Value Ref Range   Glucose-Capillary 127 (H) 65 - 99 mg/dL    ABGS  Recent Labs  12/05/14 0540 12/06/14 0306  PHART 7.404 7.414  PO2ART 141.0* 182*  TCO2 13.5  --   HCO3 28.6* 16.8*   CULTURES Recent Results (from the past 240 hour(s))  Urine culture     Status: None   Collection Time: 12/03/14 11:00 AM  Result Value Ref Range Status   Specimen Description URINE, CATHETERIZED  Final   Special Requests Normal  Final   Culture   Final    NO GROWTH 1 DAY Performed at [facility]    Report Status 12/04/2014 FINAL  Final  Culture, blood (routine x 2)     Status: None (Preliminary result)   Collection Time: 12/03/14 11:00 AM  Result Value Ref Range Status   Specimen Description BLOOD RIGHT ANTECUBITAL  Final   Special Requests BOTTLES DRAWN AEROBIC AND ANAEROBIC [place]  Final   Culture NO GROWTH 2 DAYS  Final   Report Status PENDING  Incomplete  Culture, blood (routine x 2)     Status: None (Preliminary result)   Collection Time: 12/03/14 11:20 AM  Result Value Ref Range Status   Specimen Description BLOOD RIGHT ANTECUBITAL  Final   Special Requests BOTTLES DRAWN AEROBIC AND ANAEROBIC [place]  Final   Culture NO GROWTH 2 DAYS  Final   Report Status PENDING  Incomplete  CSF culture     Status: None (Preliminary result)   Collection Time: 12/03/14  4:29 PM  Result Value Ref Range Status   Specimen Description BACK  Final   Special Requests Immunocompromised  Final   Gram Stain   Final    CYTOSPIN SLIDE NO WBC SEEN NO ORGANISMS SEEN CONFIRMED BY R.GREENE    Culture   Final    NO GROWTH 2 DAYS Performed at [facility]    Report Status PENDING  Incomplete  Gram stain     Status: None   Collection Time: 12/03/14  4:29 PM  Result Value Ref Range Status   Specimen Description BACK  Final   Special Requests Immunocompromised  Final   Gram Stain NO ORGANISMS SEEN NO WBC SEEN   Final   Report Status 12/03/2014 FINAL   Final  MRSA PCR Screening     Status: Abnormal   Collection Time: 12/04/14  2:31 AM  Result Value Ref Range Status   MRSA by PCR POSITIVE (A) NEGATIVE Final    Comment:        The GeneXpert MRSA Assay (FDA approved for NASAL specimens  only), is one component of a comprehensive MRSA colonization surveillance program. It is not intended to diagnose MRSA infection nor to guide or monitor treatment for MRSA infections. RESULT CALLED TO, READ BACK BY AND VERIFIED WITHCarron Curie ON [id number] AT 0370 BY RESSEGGER R    Studies/Results: Dg Chest Port 1 View  12/05/2014  CLINICAL DATA:  Hypoxia EXAM: PORTABLE CHEST 1 VIEW COMPARISON:  December 04, 2014 FINDINGS: Endotracheal tube tip is 2.1 cm above the carina. No pneumothorax. There is a minimal right pleural effusion. There is slight bibasilar atelectasis. Lungs elsewhere clear. Heart size and pulmonary vascularity are normal. No adenopathy. A surgical clip is again noted at the level of the aortopulmonary window. IMPRESSION: Endotracheal tube as described without pneumothorax. Slight bibasilar atelectasis. Minimal right pleural effusion. No airspace consolidation appreciable. Electronically Signed   By: Lowella Grip III M.D.   On: 12/05/2014 08:05    Medications:  Prior to Admission:  Prescriptions prior to admission  Medication Sig Dispense Refill Last Dose  . aspirin EC 81 MG tablet Take 81 mg by mouth daily.   unknown  . bisacodyl (DULCOLAX) 5 MG EC tablet Take 5 mg by mouth daily as needed for moderate constipation.   unknown  . bumetanide (BUMEX) 1 MG tablet Take 1 mg by mouth daily.    unknown at Unknown time  . calcitRIOL (ROCALTROL) 0.25 MCG capsule Take 0.25 mcg by mouth every other day.   unknown  . cetirizine (ZYRTEC) 10 MG tablet Take 5 mg by mouth daily as needed for allergies.   unknown  . diphenhydramine-acetaminophen (TYLENOL PM) 25-500 MG TABS tablet Take 1 tablet by mouth at bedtime as needed (sleep).   unknown  .  docusate sodium (COLACE) 100 MG capsule Take 100 mg by mouth 2 (two) times daily.   unknown  . esomeprazole (NEXIUM) 40 MG capsule Take 40 mg by mouth daily before breakfast.     unknown  . fluticasone (FLONASE) 50 MCG/ACT nasal spray Place 1 spray into both nostrils daily as needed for allergies.    unknown  . hydrocortisone cream 1 % Apply 1 application topically daily as needed for itching.   unknown  . LYRICA 75 MG capsule Take 75 mg by mouth 3 (three) times daily.   unknown at Unknown time  . magnesium oxide (MAG-OX) 400 MG tablet Take 400 mg by mouth daily.   unknown at Unknown time  . Melatonin 3 MG CAPS Take 3 mg by mouth at bedtime.   unknown  . metoprolol (TOPROL-XL) 200 MG 24 hr tablet Take 200 mg by mouth daily. Take along with 50 mg tablet to equal 200 mg.   unknown at Unknown time  . metoprolol succinate (TOPROL-XL) 100 MG 24 hr tablet Take 1 tablet (100 mg total) by mouth daily. Take with or immediately following a meal. 30 tablet 0 unknown  . metoprolol succinate (TOPROL-XL) 50 MG 24 hr tablet Take 50 mg by mouth daily. Take along with 200 mg tablet to equal 250 mg daily.   unknown  . montelukast (SINGULAIR) 10 MG tablet Take 10 mg by mouth at bedtime.     unknown at Unknown time  . Multiple Vitamins-Minerals (MULTIVITAMINS THER. W/MINERALS) TABS Take 1 tablet by mouth daily.     unknown  . oxyCODONE-acetaminophen (PERCOCET) 10-325 MG per tablet Take 1 tablet by mouth every 6 (six) hours as needed for pain. 20 tablet 0 unknown  . polyethylene glycol (MIRALAX / GLYCOLAX) packet Take 17 g by  mouth daily as needed (constipation).    unknown  . PROAIR HFA 108 (90 BASE) MCG/ACT inhaler Take 2 puffs by mouth every 6 (six) hours as needed.   unknown  . sertraline (ZOLOFT) 50 MG tablet Take 100 mg by mouth at bedtime.   unknown at Unknown time  . clonazePAM (KLONOPIN) 0.5 MG tablet Take 1 tablet by mouth twice daily x 30 days. (use for Klonopin) (Patient not taking: Reported on 12/04/2014) 30  tablet 0 Not Taking at Unknown time  . sodium bicarbonate 650 MG tablet Take 1 tablet (650 mg total) by mouth 2 (two) times daily. (Patient not taking: Reported on 12/04/2014)   Not Taking at Unknown time   Scheduled: . sodium chloride   Intravenous Once  . antiseptic oral rinse  7 mL Mouth Rinse QID  . aztreonam  1 g Intravenous Q8H  . calcium gluconate  2 g Intravenous Once  . chlorhexidine gluconate  15 mL Mouth Rinse BID  . Chlorhexidine Gluconate Cloth  6 each Topical Q0600  . insulin aspart  0-9 Units Subcutaneous 6 times per day  . levETIRAcetam  1,000 mg Intravenous Q12H  . levofloxacin (LEVAQUIN) IV  750 mg Intravenous Q48H  . linezolid (ZYVOX) IV  600 mg Intravenous Q12H  . magnesium sulfate 1 - 4 g bolus IVPB  1 g Intravenous Once  . mupirocin ointment  1 application Nasal BID  . pantoprazole (PROTONIX) IV  40 mg Intravenous Q12H  . potassium phosphate IVPB (mmol)  20 mmol Intravenous Once  . sodium chloride  3 mL Intravenous Q12H   Continuous: . Marland KitchenTPN (CLINIMIX-E) Adult 20 mL/hr at 12/05/14 1644  . Marland KitchenTPN (CLINIMIX-E) Adult    . dextrose 5 % and 0.45 % NaCl with KCl 40 mEq/L 125 mL/hr at 12/05/14 2308   JJH:ERDEYCXK (SUBLIMAZE) injection, fentaNYL (SUBLIMAZE) injection, midazolam, midazolam  Assesment: She was admitted with altered mental status after being found down in her home for an unknown period of time. She had acute ventilator-dependent respiratory failure. Initially she had intense metabolic acidemia GI bleeding with Mallory-Weiss tear chronic kidney disease and at baseline has metastatic breast cancer. She has improved markedly and looks like she is probably going to be able to come off the ventilator this morning. 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 Would continue all of her other treatments in the meantime.    LOS: 3 days   Genevie Elman L 12/06/2014, 8:47 AM

## 2014-12-06 NOTE — Progress Notes (Signed)
[place] [place] NOTE   Pharmacy Consult for TPN Indication: prolonged NPO, s/p Mallory Weiss tear with no NG tube for 3 days  Allergies  Allergen Reactions  . Mometasone Shortness Of Breath  . Vancomycin Itching  . Aspirin Other (See Comments)    "Inflames stomach"  . Contrast Media [Iodinated Diagnostic Agents] Other (See Comments)    Made Heart Stop.   . Cortisone Other (See Comments)    Hold fluid  . Doxycycline Nausea And Vomiting  . Ibuprofen Nausea And Vomiting  . Insulins Other (See Comments)    Pt says even the tiniest bit of insulin makes her go unconscious because her BS gets too low  . Lasix [Furosemide] Other (See Comments)    Paradoxical Response  . Other     Iv bp med unknown.and adhesive tape-silicones  . Prednisone     Sweating   . Sulfamethoxazole Other (See Comments)    Bottomed out platelets  . Tape   . Ultram [Tramadol Hcl] Other (See Comments)    "Grand mal seizure"  . Codeine Rash  . Dilantin [Phenytoin Sodium] Rash  . Latex Rash  . Piperacillin Sod-Tazobactam So Rash    Patient Measurements: Height: 4' 7"  (139.7 cm) Weight: 133 lb 6.1 oz (60.5 kg) IBW/kg (Calculated) : 34  Vital Signs: Temp: 97.9 F (36.6 C) (11/30 0700) BP: 121/68 mmHg (11/30 0700) Pulse Rate: 98 (11/30 0700) Intake/Output from previous day: 11/29 0701 - 11/30 0700 In: 400 [IV Piggyback:400] Out: 750 [Urine:750] Intake/Output from this shift:    Labs:  Recent Labs  12/03/14 2105  12/04/14 1025 12/05/14 0430 12/06/14 0426  WBC  --   < > 2.7* 5.5 6.8  HGB  --   < > 8.5* 10.2* 9.3*  HCT  --   < > 24.1* 29.1* 26.7*  PLT  --   < > 86* 47* 35*  APTT 47*  --  55*  --   --   INR 1.54*  --  1.63*  --   --   < > = values in this interval not displayed.   Recent Labs  12/04/14 0115 12/04/14 0450  12/05/14 0430 12/05/14 1034 12/06/14 0426  NA 149*  --   < > 146* 147* 140  K 2.4*  --   < > 2.8* 3.1* 4.1  CL 128*  --   < > 119* 119* 118*  CO2 10*   --   < > 17* 15* 16*  GLUCOSE 126*  --   < > 129* 101* 136*  BUN 56*  --   < > 47* 46* 37*  CREATININE 2.06*  --   < > 1.81* 1.77* 1.49*  CALCIUM 7.3*  --   < > 6.4* 6.5* 6.6*  MG  --  1.9  --  1.3*  --  1.7  PHOS  --   --   --  1.0*  --  <1.0*  PROT 5.3*  --   --  4.8*  --  4.9*  ALBUMIN 2.7*  --   --  2.3*  --  2.3*  AST 45*  --   --  61*  --  44*  ALT 18  --   --  22  --  22  ALKPHOS 85  --   --  98  --  114  BILITOT 0.8  --   --  1.3*  --  1.2  PREALBUMIN  --   --   --  17.0*  --   --  TRIG  --   --   --  430*  --   --   < > = values in this interval not displayed. Estimated Creatinine Clearance: 25.4 mL/min (by C-G formula based on Cr of 1.49).    Recent Labs  12/05/14 2017 12/06/14 0024 12/06/14 0535  GLUCAP 114* 112* 127*    Medical History: Past Medical History  Diagnosis Date  . Kidney disease   . Hypertension   . Diabetes mellitus   . Cancer ([place])     brest met to bone  . Arthritis   . CHF (congestive heart failure) ([place])   . Lymphedema of leg   . Anxiety   . Pulmonary hypertension ([place])   . 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 Pancreatitis   . Depression   . Anemia   . Shingles   . PUD (peptic ulcer disease)   . GERD (gastroesophageal reflux disease)   . MRSA (methicillin resistant staph aureus) culture positive     Insulin Requirements in the past 24 hours:  none   Current Nutrition:  none  Assessment:  Pt is admitted for being down with unclear etiology and unknown how long, hypothermic, metabolic acidosis with incomplete respiratory compensation, hypotensive, hypovolemic, seizure, and septic. She developed upper GI Bleed and has no further evidence of bleeding.Started on TPN for prolonged NPO.  Electrolytes to be replaced this am including Phos, Ca, and Magnesium.  Pt tolerating TPN at 65ms/hr and not requiring insulin, will advance rate of TPN formula.   Nutritional Goals:  Per RD 1424 Kcal and 78 g  protein  Plan:  Advance Clinimix E 5/15 to 40 ml/hr Will add trace elements and MVI No lipids for now F/u am labs  Thanks for allowing pharmacy to be a part of this patient's care.  LIsac Sarna BS Pharm D, BCPS Clinical Pharmacist Pager #[phone]/30/2016,7:56 AM

## 2014-12-06 NOTE — Progress Notes (Addendum)
[facility] PROGRESS NOTE  Catherine Mccullough [id number] DOB: 07/02/1945 DOA: 12/03/2014 PCP: Default, Provider, MD  Assessment/Plan: Acute encephalopathy -Patient was brought into the hospital after being down for what is thought to be 3 days. -Neurology is on board. -We seem to believe currently that etiology is her seizures. -Patient had a lumbar puncture that was not indicative for infection although she did have a really high total protein. -Earlier this morning patient was alert, awake and following commands, decision had been made to extubate her, however she then became unresponsive with heart rates into the 170s. -It is presumed she might have had another seizure. -She can currently open eyes to voice but is very drowsy and unable to follow commands.  Ventilatory dependent hypoxic and hypercarbic respiratory failure -We'll keep on ventilator today given above findings. -Appreciate Dr. Luan Pulling input and recommendations. -Given severity of disease, will request palliative care consultation for goals of care.  Hypothermia -Temperature on admission was 90. -She is currently normothermic.  Hypo-tension -Blood pressure is currently stable without pressors for over 48 hours.  Acute on Chronic kidney disease stage III -Baseline creatinine is around 1.4-1.7. -Was 2.5 on admission, is currently down to her baseline of 1.5 on 11/30.  Atrial fibrillation with rapid ventricular response -Likely due to stressors of acute illness. -Rate has slowed down with a 15 mg Cardizem bolus, will start infusion. -Magnesium this a.m. 1.7, critically low phosphorus at less than 1, will replete. Potassium within normal limits at 4.1. -We'll check TSH, 2-D echo.  Seizure activity -Continue high-dose IV Keppra as recommended by neurology.  Burnis Medin order MRI today as requested by neurology.  Upper GI bleed -Due to Mallory-Weiss tear status post emergent bedside EGD on 11/27 with  placement of 2 clips. -No further evidence of continued GI bleed. -Hemoglobin stable 11/30 at 9.3, received a total of 2 units of PRBCs this admission. -Appreciate GI input and recommendations.  Nutrition -Continue TPN given prolonged ventilator stay.  ? Presumed sepsis -No evidence for active infection: Chest x-ray negative, urine, blood, CSF cultures negative to date, negative urinalysis. -I believe her presentation with hypothermia, hypotension were more due to seizure activity and prolonged fallen state than to infection. -She remains afebrile and without leukocytosis. -We'll elect to discontinue all antibiotics and observe.      Code Status: Remains full code for now Family Communication: We'll attempt to contact family later today  Disposition Plan: Keep in ICU, remains mechanically ventilated. Patient is critically ill. Request palliative care consultation.   Consultants:  Neurology  Pulmonology  Gastroenterology   Antibiotics:  None   Subjective: Per nursing staff was alert and following commands earlier this a.m., however at time of my exam is currently unresponsive with heart rates in the 160s.  Objective: Filed Vitals:   12/06/14 0400 12/06/14 0500 12/06/14 0600 12/06/14 0700  BP: 119/70 116/69 116/71 121/68  Pulse: 95 96 95 98  Temp: 98.2 F (36.8 C) 98.4 F (36.9 C) 98.2 F (36.8 C) 97.9 F (36.6 C)  TempSrc:      Resp: 18 20 12 15   Height:      Weight:  60.5 kg (133 lb 6.1 oz)    SpO2: 100% 100% 100% 100%    Intake/Output Summary (Last 24 hours) at 12/06/14 0929 Last data filed at 12/06/14 0700  Gross per 24 hour  Intake    400 ml  Output    750 ml  Net   -350 ml   Filed  Weights   12/03/14 2249 12/05/14 0400 12/06/14 0500  Weight: 52.1 kg (114 lb 13.8 oz) 52.1 kg (114 lb 13.8 oz) 60.5 kg (133 lb 6.1 oz)    Exam:   General:  Opens eyes briefly to voice, unable to follow commands, remains intubated  Cardiovascular: Very tachycardic,  irregular unable to auscultate any murmurs  Respiratory: Coarse bilateral breath sounds with rhonchi and rails  Abdomen: Soft, nontender, nondistended, positive bowel sounds  Extremities: Trace bilateral edema bilaterally   Neurologic:  Unable to fully assess given current mental state  Data Reviewed: Basic Metabolic Panel:  Recent Labs Lab 12/04/14 0115 12/04/14 0450 12/04/14 1025 12/05/14 0430 12/05/14 1034 12/06/14 0426  NA 149*  --  148* 146* 147* 140  K 2.4*  --  2.7* 2.8* 3.1* 4.1  CL 128*  --  125* 119* 119* 118*  CO2 10*  --  13* 17* 15* 16*  GLUCOSE 126*  --  139* 129* 101* 136*  BUN 56*  --  59* 47* 46* 37*  CREATININE 2.06*  --  2.13* 1.81* 1.77* 1.49*  CALCIUM 7.3*  --  7.1* 6.4* 6.5* 6.6*  MG  --  1.9  --  1.3*  --  1.7  PHOS  --   --   --  1.0*  --  <1.0*   Liver Function Tests:  Recent Labs Lab 12/03/14 1100 12/04/14 0115 12/05/14 0430 12/06/14 0426  AST 40 45* 61* 44*  ALT 20 18 22 22   ALKPHOS 107 85 98 114  BILITOT 0.8 0.8 1.3* 1.2  PROT 8.3* 5.3* 4.8* 4.9*  ALBUMIN 4.5 2.7* 2.3* 2.3*   No results for input(s): LIPASE, AMYLASE in the last 168 hours.  Recent Labs Lab 12/03/14 1100  AMMONIA 40*   CBC:  Recent Labs Lab 12/03/14 1100 12/04/14 0115 12/04/14 1025 12/05/14 0430 12/06/14 0426  WBC 18.6* 2.1* 2.7* 5.5 6.8  NEUTROABS 17.0* 1.9  --  4.8  --   HGB 13.7 8.3* 8.5* 10.2* 9.3*  HCT 40.5 23.7* 24.1* 29.1* 26.7*  MCV 104.1* 100.4* 98.8 90.7 91.4  PLT 205 102* 86* 47* 35*   Cardiac Enzymes:  Recent Labs Lab 12/03/14 1200 12/05/14 1034  CKTOTAL 356*  --   TROPONINI  --  0.28*   BNP (last 3 results)  Recent Labs  03/04/14 0726 08/21/14 0029  BNP 1040.0* 1557.0*    ProBNP (last 3 results) No results for input(s): PROBNP in the last 8760 hours.  CBG:  Recent Labs Lab 12/05/14 1654 12/05/14 2017 12/06/14 0024 12/06/14 0535 12/06/14 0754  GLUCAP 115* 114* 112* 127* 125*    Recent Results (from the past 240  hour(s))  Urine culture     Status: None   Collection Time: 12/03/14 11:00 AM  Result Value Ref Range Status   Specimen Description URINE, CATHETERIZED  Final   Special Requests Normal  Final   Culture   Final    NO GROWTH 1 DAY Performed at [facility]    Report Status 12/04/2014 FINAL  Final  Culture, blood (routine x 2)     Status: None (Preliminary result)   Collection Time: 12/03/14 11:00 AM  Result Value Ref Range Status   Specimen Description BLOOD RIGHT ANTECUBITAL  Final   Special Requests BOTTLES DRAWN AEROBIC AND ANAEROBIC [place]  Final   Culture NO GROWTH 2 DAYS  Final   Report Status PENDING  Incomplete  Culture, blood (routine x 2)     Status: None (Preliminary  result)   Collection Time: 12/03/14 11:20 AM  Result Value Ref Range Status   Specimen Description BLOOD RIGHT ANTECUBITAL  Final   Special Requests BOTTLES DRAWN AEROBIC AND ANAEROBIC [place]  Final   Culture NO GROWTH 2 DAYS  Final   Report Status PENDING  Incomplete  CSF culture     Status: None (Preliminary result)   Collection Time: 12/03/14  4:29 PM  Result Value Ref Range Status   Specimen Description BACK  Final   Special Requests Immunocompromised  Final   Gram Stain   Final    CYTOSPIN SLIDE NO WBC SEEN NO ORGANISMS SEEN CONFIRMED BY R.GREENE    Culture   Final    NO GROWTH 2 DAYS Performed at [facility]    Report Status PENDING  Incomplete  Gram stain     Status: None   Collection Time: 12/03/14  4:29 PM  Result Value Ref Range Status   Specimen Description BACK  Final   Special Requests Immunocompromised  Final   Gram Stain NO ORGANISMS SEEN NO WBC SEEN   Final   Report Status 12/03/2014 FINAL  Final  MRSA PCR Screening     Status: Abnormal   Collection Time: 12/04/14  2:31 AM  Result Value Ref Range Status   MRSA by PCR POSITIVE (A) NEGATIVE Final    Comment:        The GeneXpert MRSA Assay (FDA approved for NASAL specimens only), is one component of  a comprehensive MRSA colonization surveillance program. It is not intended to diagnose MRSA infection nor to guide or monitor treatment for MRSA infections. RESULT CALLED TO, READ BACK BY AND VERIFIED WITHCarron Curie ON [id number] AT [id number] BY RESSEGGER R      Studies: Dg Chest Port 1 View  12/05/2014  CLINICAL DATA:  Hypoxia EXAM: PORTABLE CHEST 1 VIEW COMPARISON:  December 04, 2014 FINDINGS: Endotracheal tube tip is 2.1 cm above the carina. No pneumothorax. There is a minimal right pleural effusion. There is slight bibasilar atelectasis. Lungs elsewhere clear. Heart size and pulmonary vascularity are normal. No adenopathy. A surgical clip is again noted at the level of the aortopulmonary window. IMPRESSION: Endotracheal tube as described without pneumothorax. Slight bibasilar atelectasis. Minimal right pleural effusion. No airspace consolidation appreciable. Electronically Signed   By: Lowella Grip III M.D.   On: 12/05/2014 08:05    Scheduled Meds: . sodium chloride   Intravenous Once  . antiseptic oral rinse  7 mL Mouth Rinse QID  . aztreonam  1 g Intravenous Q8H  . calcium gluconate  2 g Intravenous Once  . chlorhexidine gluconate  15 mL Mouth Rinse BID  . Chlorhexidine Gluconate Cloth  6 each Topical Q0600  . diltiazem  15 mg Intravenous Once  . insulin aspart  0-9 Units Subcutaneous 6 times per day  . levETIRAcetam  1,000 mg Intravenous Q12H  . levofloxacin (LEVAQUIN) IV  750 mg Intravenous Q48H  . linezolid (ZYVOX) IV  600 mg Intravenous Q12H  . magnesium sulfate 1 - 4 g bolus IVPB  1 g Intravenous Once  . mupirocin ointment  1 application Nasal BID  . pantoprazole (PROTONIX) IV  40 mg Intravenous Q12H  . potassium phosphate IVPB (mmol)  20 mmol Intravenous Once  . sodium chloride  3 mL Intravenous Q12H   Continuous Infusions: . Marland KitchenTPN (CLINIMIX-E) Adult 20 mL/hr at 12/05/14 1644  . Marland KitchenTPN (CLINIMIX-E) Adult    . dextrose 5 % and 0.45 % NaCl with KCl  40 mEq/L 125 mL/hr at  12/05/14 2308  . diltiazem (CARDIZEM) infusion      Principal Problem:   Altered mental status Active Problems:   Breast cancer metastasized to bone (HCC)   Dehydration   CKD (chronic kidney disease) stage 4, GFR 15-29 ml/min (HCC)   DM type 2 (diabetes mellitus, type 2) (HCC)   Mallory-Weiss tear    Critical care time spent: 65 minutes. Greater than 50% of this time was spent in direct contact with the patient coordinating care.    Lelon Frohlich  [facility] Pager [phone]  If 7PM-7AM, please contact night-coverage at [email or web address], password [facility] 12/06/2014, 9:29 AM  LOS: 3 days

## 2014-12-06 NOTE — Progress Notes (Signed)
Patient ID: Catherine Mccullough, female   DOB: 1945-06-04, 69 y.o.   MRN: [id number]  Harrison A. [name], MD     [email or web address]          Catherine Mccullough is an 69 y.o. female.   Assessment/Plan: Acute severe encephalopathy of unclear etiology but associated with very high CSF protein, seizures and hypothermia. CSF analysis and other testing does not support diagnosis of infection at this time. The differential diagnosis for very high CSF protein includes bacterial related meningitis, TB meningitis, brain tumors, intracranial trauma and intracranial hemorrhage. Given the seizures, I think this is the most likely etiology however. 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  RECOMMENDATION: Will add vimpat for better seizure control. The patient will be started on high dose Keppra IV. Imaging of the brain with IV contrast once the patient is stable. MRI is preferable. Avoid sedatives.   The patient was asked whether today as she was awake, alert and wanted the endotracheal tumor removed. Unfortunately, it appears the patient had episode of unresponsiveness with tachycardia into the 170s. Her vent weaning was halted. The episode is presumably due to unwitnessed seizure.     Still intubated   HEENT: Supple. Atraumatic normocephalic.   ABDOMEN: soft  EXTREMITIES: No edema   BACK: Normal.  SKIN: Normal by inspection.   MENTAL STATUS: She is awake and alert. She does follow midline and appendicular commands. She tries to talk.  CRANIAL NERVES: The pupils are deviated to the left and downward;  upper and lower facial muscles are normal in strength and symmetric, there is no flattening of the nasolabial folds. Cough and gag reflexes are intact. She has full  extraocular movements. No nystagmus is appreciated.  MOTOR: Bulk and tone are normal throughout.  She has antigravity strength of the upper extremities.  COORDINATION: No tremors or dysmetria is appreciated.  REFLEXES: Deep tendon reflexes are symmetrical and normal. Babinski reflexes are flexor on the right but upgoing on the left  SENSATION: She responds to deep painful stimuli bilaterally   EEG shows moderate to severe global slowing. No epileptiform activities.   Objective: Vital signs in last 24 hours: Temp:  [97.5 F (36.4 C)-99.5 F (37.5 C)] 99.3 F (37.4 C) (11/30 1600) Pulse Rate:  [95-159] 105 (11/30 1600) Resp:  [12-33] 19 (11/30 1600) BP: (84-146)/(59-104) 99/68 mmHg (11/30 1600) SpO2:  [96 %-100 %] 100 % (11/30 1600) FiO2 (%):  [35 %] 35 % (11/30 1522) Weight:  [60.5 kg (133 lb 6.1 oz)] 60.5 kg (133 lb 6.1 oz) (11/30 0500)  Intake/Output from previous day: 11/29 0701 - 11/30 0700 In: 400 [IV Piggyback:400] Out: 750 [Urine:750] Intake/Output this shift: Total I/O In: 1758.9 [I.V.:1152.3; IV Piggyback:606.7] Out: -  Nutritional status: Diet NPO time specified .TPN (CLINIMIX-E) Adult .TPN (CLINIMIX-E) Adult   Lab Results: Results for orders placed or performed during the hospital encounter of 12/03/14 (from the past 48 hour(s))  Comprehensive metabolic panel     Status: Abnormal   Collection Time: 12/05/14  4:30 AM  Result Value Ref Range   Sodium 146 (H) 135 - 145 mmol/L   Potassium 2.8 (L) 3.5 - 5.1 mmol/L   Chloride 119 (H) 101 - 111 mmol/L   CO2 17 (L) 22 - 32 mmol/L   Glucose, Bld 129 (H) 65 - 99 mg/dL  BUN 47 (H) 6 - 20 mg/dL   Creatinine, Ser 1.81 (H) 0.44 - 1.00 mg/dL   Calcium 6.4 (LL) 8.9 - 10.3 mg/dL    Comment: CRITICAL RESULT CALLED TO, READ BACK BY AND VERIFIED WITH: HAMMOCK,S AT 6:00AM ON 12/05/14 BY FESTERMAN,C    Total Protein 4.8 (L) 6.5 - 8.1 g/dL   Albumin 2.3 (L) 3.5 - 5.0 g/dL   AST 61 (H) 15 - 41 U/L   ALT 22 14 - 54 U/L    Alkaline Phosphatase 98 38 - 126 U/L   Total Bilirubin 1.3 (H) 0.3 - 1.2 mg/dL   GFR calc non Af Amer 28 (L) >60 mL/min   GFR calc Af Amer 32 (L) >60 mL/min    Comment: (NOTE) The eGFR has been calculated using the CKD EPI equation. This calculation has not been validated in all clinical situations. eGFR's persistently <60 mL/min signify possible Chronic Kidney Disease.    Anion gap 10 5 - 15  Magnesium     Status: Abnormal   Collection Time: 12/05/14  4:30 AM  Result Value Ref Range   Magnesium 1.3 (L) 1.7 - 2.4 mg/dL  Phosphorus     Status: Abnormal   Collection Time: 12/05/14  4:30 AM  Result Value Ref Range   Phosphorus 1.0 (LL) 2.5 - 4.6 mg/dL    Comment: CRITICAL RESULT CALLED TO, READ BACK BY AND VERIFIED WITH: HAMMOCK,S AT 6:00AM ON 12/05/14 BY FESTERMAN,C   Triglycerides     Status: Abnormal   Collection Time: 12/05/14  4:30 AM  Result Value Ref Range   Triglycerides 430 (H) <150 mg/dL  Prealbumin     Status: Abnormal   Collection Time: 12/05/14  4:30 AM  Result Value Ref Range   Prealbumin 17.0 (L) 18 - 38 mg/dL    Comment: Performed at [facility]  RPR     Status: None   Collection Time: 12/05/14  4:30 AM  Result Value Ref Range   RPR Ser Ql Non Reactive Non Reactive    Comment: (NOTE) Performed At: [facility] [place], [place] [id number] Lindon Romp MD [id number]   Vitamin B12     Status: Abnormal   Collection Time: 12/05/14  4:30 AM  Result Value Ref Range   Vitamin B-12 1185 (H) 180 - 914 pg/mL    Comment: (NOTE) This assay is not validated for testing neonatal or myeloproliferative syndrome specimens for Vitamin B12 levels. Performed at [facility]   Homocysteine     Status: None   Collection Time: 12/05/14  4:30 AM  Result Value Ref Range   Homocysteine 3.8 0.0 - 15.0 umol/L    Comment: (NOTE) Performed At: [facility] [place], [place] [id number] Lindon Romp MD  [id number]   TSH     Status: None   Collection Time: 12/05/14  4:30 AM  Result Value Ref Range   TSH 1.699 0.350 - 4.500 uIU/mL  HIV antibody (routine testing) (NOT for [facility])     Status: None   Collection Time: 12/05/14  4:30 AM  Result Value Ref Range   HIV Screen 4th Generation wRfx Non Reactive Non Reactive    Comment: (NOTE) Performed At: [facility] [address] [place], [place] [id number] Lindon Romp MD [id number]   CBC with Differential/Platelet     Status: Abnormal   Collection Time: 12/05/14  4:30 AM  Result Value Ref Range   WBC 5.5 4.0 - 10.5 K/uL  RBC 3.21 (L) 3.87 - 5.11 MIL/uL   Hemoglobin 10.2 (L) 12.0 - 15.0 g/dL   HCT 29.1 (L) 36.0 - 46.0 %   MCV 90.7 78.0 - 100.0 fL    Comment: DELTA CHECK NOTED   MCH 31.8 26.0 - 34.0 pg   MCHC 35.1 30.0 - 36.0 g/dL   RDW 20.3 (H) 11.5 - 15.5 %   Platelets 47 (L) 150 - 400 K/uL   Neutrophils Relative % 87 %   Neutro Abs 4.8 1.7 - 7.7 K/uL   Lymphocytes Relative 7 %   Lymphs Abs 0.4 (L) 0.7 - 4.0 K/uL   Monocytes Relative 4 %   Monocytes Absolute 0.2 0.1 - 1.0 K/uL   Eosinophils Relative 2 %   Eosinophils Absolute 0.1 0.0 - 0.7 K/uL   Basophils Relative 0 %   Basophils Absolute 0.0 0.0 - 0.1 K/uL   WBC Morphology INCREASED BANDS (>20% BANDS)   Blood gas, arterial     Status: Abnormal   Collection Time: 12/05/14  5:40 AM  Result Value Ref Range   FIO2 60.00    Delivery systems VENTILATOR    Mode PRESSURE REGULATED VOLUME CONTROL    VT 500 mL   LHR 14 resp/min   Peep/cpap 5.0 cm H20   pH, Arterial 7.404 7.350 - 7.450   pCO2 arterial 48.2 (H) 35.0 - 45.0 mmHg   pO2, Arterial 141.0 (H) 80.0 - 100.0 mmHg   Bicarbonate 28.6 (H) 20.0 - 24.0 mEq/L   TCO2 13.5 0 - 100 mmol/L   Acid-Base Excess 4.9 (H) 0.0 - 2.0 mmol/L   O2 Saturation 98.7 %   Patient temperature 37.0    Collection site RIGHT RADIAL    Drawn by [id number]    Sample type ARTERIAL    Allens test (pass/fail) PASS PASS  Basic metabolic  panel     Status: Abnormal   Collection Time: 12/05/14 10:34 AM  Result Value Ref Range   Sodium 147 (H) 135 - 145 mmol/L   Potassium 3.1 (L) 3.5 - 5.1 mmol/L   Chloride 119 (H) 101 - 111 mmol/L   CO2 15 (L) 22 - 32 mmol/L   Glucose, Bld 101 (H) 65 - 99 mg/dL   BUN 46 (H) 6 - 20 mg/dL   Creatinine, Ser 1.77 (H) 0.44 - 1.00 mg/dL   Calcium 6.5 (L) 8.9 - 10.3 mg/dL   GFR calc non Af Amer 28 (L) >60 mL/min   GFR calc Af Amer 33 (L) >60 mL/min    Comment: (NOTE) The eGFR has been calculated using the CKD EPI equation. This calculation has not been validated in all clinical situations. eGFR's persistently <60 mL/min signify possible Chronic Kidney Disease.    Anion gap 13 5 - 15  Troponin I     Status: Abnormal   Collection Time: 12/05/14 10:34 AM  Result Value Ref Range   Troponin I 0.28 (H) <0.031 ng/mL    Comment:        PERSISTENTLY INCREASED TROPONIN VALUES IN THE RANGE OF 0.04-0.49 ng/mL CAN BE SEEN IN:       -UNSTABLE ANGINA       -CONGESTIVE HEART FAILURE       -MYOCARDITIS       -CHEST TRAUMA       -ARRYHTHMIAS       -LATE PRESENTING MYOCARDIAL INFARCTION       -COPD   CLINICAL FOLLOW-UP RECOMMENDED.   Glucose, capillary     Status: None   Collection  Time: 12/05/14 10:56 AM  Result Value Ref Range   Glucose-Capillary 85 65 - 99 mg/dL   Comment 1 Repeat Test   Glucose, capillary     Status: Abnormal   Collection Time: 12/05/14  4:54 PM  Result Value Ref Range   Glucose-Capillary 115 (H) 65 - 99 mg/dL   Comment 1 Document in Chart   Glucose, capillary     Status: Abnormal   Collection Time: 12/05/14  8:17 PM  Result Value Ref Range   Glucose-Capillary 114 (H) 65 - 99 mg/dL   Comment 1 Notify RN    Comment 2 Document in Chart   Glucose, capillary     Status: Abnormal   Collection Time: 12/06/14 12:24 AM  Result Value Ref Range   Glucose-Capillary 112 (H) 65 - 99 mg/dL  Blood gas, arterial     Status: Abnormal   Collection Time: 12/06/14  3:06 AM  Result  Value Ref Range   FIO2 0.35    Delivery systems VENTILATOR    Mode PRESSURE REGULATED VOLUME CONTROL    VT 500 mL   LHR 20.0 resp/min   Peep/cpap 5.0 cm H20   pH, Arterial 7.414 7.350 - 7.450   pCO2 arterial 21.1 (L) 35.0 - 45.0 mmHg   pO2, Arterial 182 (H) 80.0 - 100.0 mmHg   Bicarbonate 16.8 (L) 20.0 - 24.0 mEq/L   Acid-base deficit 10.5 (H) 0.0 - 2.0 mmol/L   O2 Saturation 99.5 %   Collection site LEFT RADIAL    Drawn by [id number]    Sample type ARTERIAL DRAW    Allens test (pass/fail) PASS PASS  Comprehensive metabolic panel     Status: Abnormal   Collection Time: 12/06/14  4:26 AM  Result Value Ref Range   Sodium 140 135 - 145 mmol/L   Potassium 4.1 3.5 - 5.1 mmol/L    Comment: DELTA CHECK NOTED   Chloride 118 (H) 101 - 111 mmol/L   CO2 16 (L) 22 - 32 mmol/L   Glucose, Bld 136 (H) 65 - 99 mg/dL   BUN 37 (H) 6 - 20 mg/dL   Creatinine, Ser 1.49 (H) 0.44 - 1.00 mg/dL   Calcium 6.6 (L) 8.9 - 10.3 mg/dL   Total Protein 4.9 (L) 6.5 - 8.1 g/dL   Albumin 2.3 (L) 3.5 - 5.0 g/dL   AST 44 (H) 15 - 41 U/L   ALT 22 14 - 54 U/L   Alkaline Phosphatase 114 38 - 126 U/L   Total Bilirubin 1.2 0.3 - 1.2 mg/dL   GFR calc non Af Amer 35 (L) >60 mL/min   GFR calc Af Amer 40 (L) >60 mL/min    Comment: (NOTE) The eGFR has been calculated using the CKD EPI equation. This calculation has not been validated in all clinical situations. eGFR's persistently <60 mL/min signify possible Chronic Kidney Disease.    Anion gap 6 5 - 15  CBC     Status: Abnormal   Collection Time: 12/06/14  4:26 AM  Result Value Ref Range   WBC 6.8 4.0 - 10.5 K/uL   RBC 2.92 (L) 3.87 - 5.11 MIL/uL   Hemoglobin 9.3 (L) 12.0 - 15.0 g/dL   HCT 26.7 (L) 36.0 - 46.0 %   MCV 91.4 78.0 - 100.0 fL   MCH 31.8 26.0 - 34.0 pg   MCHC 34.8 30.0 - 36.0 g/dL   RDW 22.7 (H) 11.5 - 15.5 %   Platelets 35 (L) 150 - 400 K/uL    Comment:  SPECIMEN CHECKED FOR CLOTS PLATELET COUNT CONFIRMED BY SMEAR   Phosphorus     Status:  Abnormal   Collection Time: 12/06/14  4:26 AM  Result Value Ref Range   Phosphorus <1.0 (LL) 2.5 - 4.6 mg/dL    Comment: CRITICAL RESULT CALLED TO, READ BACK BY AND VERIFIED WITH: HAMMOCK,S AT 7:00AM ON 12/06/14 BY FESTERMAN,C   Magnesium     Status: None   Collection Time: 12/06/14  4:26 AM  Result Value Ref Range   Magnesium 1.7 1.7 - 2.4 mg/dL  Glucose, capillary     Status: Abnormal   Collection Time: 12/06/14  5:35 AM  Result Value Ref Range   Glucose-Capillary 127 (H) 65 - 99 mg/dL  Glucose, capillary     Status: Abnormal   Collection Time: 12/06/14  7:54 AM  Result Value Ref Range   Glucose-Capillary 125 (H) 65 - 99 mg/dL   Comment 1 Document in Chart   TSH     Status: None   Collection Time: 12/06/14 10:02 AM  Result Value Ref Range   TSH 1.388 0.350 - 4.500 uIU/mL  Glucose, capillary     Status: Abnormal   Collection Time: 12/06/14 11:33 AM  Result Value Ref Range   Glucose-Capillary 196 (H) 65 - 99 mg/dL   Comment 1 Notify RN    Comment 2 Document in Chart   Glucose, capillary     Status: Abnormal   Collection Time: 12/06/14  4:22 PM  Result Value Ref Range   Glucose-Capillary 192 (H) 65 - 99 mg/dL   Comment 1 Notify RN    Comment 2 Document in Chart     Lipid Panel  Recent Labs  12/05/14 0430  TRIG 430*    Studies/Results:   Medications:  Scheduled Meds: . sodium chloride   Intravenous Once  . antiseptic oral rinse  7 mL Mouth Rinse QID  . chlorhexidine gluconate  15 mL Mouth Rinse BID  . Chlorhexidine Gluconate Cloth  6 each Topical Q0600  . diltiazem  15 mg Intravenous Once  . insulin aspart  0-9 Units Subcutaneous 6 times per day  . levETIRAcetam  1,000 mg Intravenous Q12H  . mupirocin ointment  1 application Nasal BID  . pantoprazole (PROTONIX) IV  40 mg Intravenous Q12H  . phosphorus  250 mg Oral BID  . sodium chloride  3 mL Intravenous Q12H   Continuous Infusions: . Marland KitchenTPN (CLINIMIX-E) Adult 20 mL/hr at 12/05/14 1644  . Marland KitchenTPN (CLINIMIX-E)  Adult    . dextrose 5 % and 0.45 % NaCl with KCl 40 mEq/L 125 mL/hr at 12/06/14 1600  . diltiazem (CARDIZEM) infusion 7.5 mg/hr (12/06/14 1618)  . fentaNYL infusion INTRAVENOUS 50 mcg/hr (12/06/14 1523)   PRN Meds:.fentaNYL, fentaNYL (SUBLIMAZE) injection, fentaNYL (SUBLIMAZE) injection, midazolam, midazolam     LOS: 3 days   Zaiah Eckerson A. [name], M.D.  Diplomate, Tax adviser of Psychiatry and Neurology ( Neurology).

## 2014-12-06 NOTE — Progress Notes (Signed)
Patient not following commands at this time. Patient not making eye contact. Patient leaning towards to the right side. Nonverbal. Heart rate 180s. EKG A-fib RVR heart rate 178. Dr. Jerilee Hoh in to see patient. Cardizem 15mg  given IV pusn.

## 2014-12-06 NOTE — Progress Notes (Signed)
CRITICAL VALUE ALERT  Critical value received:Phosphorus 0.9  Date of notification:  12/06/14  Time of notification:  0700  Critical value read back:Yes.    Nurse who received alert:  Miquel Dunn   MD notified (1st page):  Jerilee Hoh   Time of first page:  0703  MD notified (2nd page):  Time of second page:  Responding MD:    Time MD responded:

## 2014-12-06 NOTE — Consult Note (Signed)
Consultation Note Date: 12/06/2014   Patient Name: Catherine Mccullough  DOB: 1945-04-09  MRN: [id number]  Age / Sex: 69 y.o., female   PCP: Provider Default, MD Referring Physician: York Mccullough*  Reason for Consultation: Establishing goals of care and Psychosocial/spiritual support  Palliative Care Assessment and Plan Summary of Established Goals of Care and Medical Treatment Preferences   Clinical Assessment/Narrative: Catherine Mccullough is lying in bed.  She is calm at this time, ventilated.  Her father, Catherine Mccullough, and aunt Catherine Mccullough are at her bedside. Her son Catherine Mccullough arrives a few minutes later. We talk about her current health problems.  They tell me that she has been falling often, and they have found her "out of it" 4-5 times recently.  They go on to share that she also  has had "mush mouth", and not making sense when she speaks at times.  We talk about her history of cancer, mastectomy in 1988 and 2009, and the history of bone mets.  Catherine Mccullough tells me that she had been seeing an oncologist at [facility], but stopped going d/t transportation issues.  We talk about the concern over possible brain cancer, but no images.  Catherine Mccullough states, "I think that's what may have happened".   Family talks about her as "stong willed', and "independant".  Catherine Mccullough states, "she was supposed to have hemodialysis or die, she did it once and left."  We talk about what is important to Catherine Mccullough, making sure to keep her in the focus.  Her father states she told him she didn't want to be a burden to the family and would go in a nursing home, but Catherine Mccullough and Catherine Mccullough talk about her independence.  They share that she has been in SNF in the past, [facility], where she developed a bedsore on her leg.  Catherine Mccullough shares a recent photo of Catherine Mccullough's " fashion and makeup".   We talk about quality of life and what would be important to [place].  I share the chronic illness trajectory, and we talk about the concept of  transition to comfort care if no improvement.  I share that we are not asking for them to make choices at this time.  We talk about the paths that are ahead and that each will have many changes and that we will continue to do what we can for her.   Contacts/Participants in Discussion: Primary Decision Maker: HCPOA son Catherine Mccullough  HCPOA: yes  Son Catherine Mccullough, daughter Catherine Mccullough lives in [place].   Code Status/Advance Care Planning:  FULL code at this time  We discuss the concepts of chest compression with breaking of ribs (Catherine Mccullough winces at this statement), defibrillation, and allow a natural death.   Symptom Management:   Fentanyl infusion, versed PRN for ventilator.   Palliative Prophylaxis: None at this time.   Psycho-social/Spiritual:   Support System: Lives alone, father and son live together nearby and check her often.   Desire for further Chaplaincy support:Not discussed today.   Prognosis: Unable to determine  Discharge Planning:  Dependant on outcomes.        Chief Complaint:  found unresponsive, down for three days.   History of Present Illness:  Catherine Mccullough is an 69 y.o. female with hx of CKD, DM, breast cancer with bone mets, hx of diastolic CHF., found by her son at home after three days, unclear how long she was down. She was brought to the ER hypothermic at 51F, and  BP 125, unresponsive, but was able to maintain her breathing and airways. In the ER, she was given IV Keppra, bear hugger and warm saline. EKG showed NSR with U waves, Cr 2.5, with Bicarb of 5, Na of 147 and Chloride of 125. Her UA was negative for infection. A femoral line was place on the right groin with triple lumens. As she was warming up, her BP expectedly dropped and she was given more IVF. She was started on Keppra for her seizure. She subsequently had a spinal tap, and was started on IV Zyvox and Aztreonam and Levoquin. Hospitalist was asked to admit her for further Tx. EDP did  not feel that she was a candidate for PCCM transfer. I spoke with Dr Janann Colonel of PCCM, he recommended to check Ethylene Glycol, ABG, and an osmolar gap. He recommended IVF and pressor if required. He felt that unless there is Ethylene Glycol present, he doesn't feel that she needs PCCM.   Primary Diagnoses  Present on Admission:  . Altered mental status . Breast cancer metastasized to bone ([place]) . CKD (chronic kidney disease) stage 4, GFR 15-29 ml/min (HCC) . Dehydration  Palliative Review of Systems: Unable to answer d/t endotracheal tube.  I have reviewed the medical record, interviewed the patient and family, and examined the patient. The following aspects are pertinent.  Past Medical History  Diagnosis Date  . Kidney disease   . Hypertension   . Diabetes mellitus   . Cancer ([place])     brest met to bone  . Arthritis   . CHF (congestive heart failure) ([place])   . Lymphedema of leg   . Anxiety   . Pulmonary hypertension ([place])   . 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 Pancreatitis   . Depression   . Anemia   . Shingles   . PUD (peptic ulcer disease)   . GERD (gastroesophageal reflux disease)   . MRSA (methicillin resistant staph aureus) culture positive    Social History   Social History  . Marital Status: Divorced    Spouse Name: N/A  . Number of Children: N/A  . Years of Education: N/A   Social History Main Topics  . Smoking status: Never Smoker   . Smokeless tobacco: Never Used  . Alcohol Use: No  . Drug Use: No  . Sexual Activity: Not Currently   Other Topics Concern  . None   Social History Narrative   Family History  Problem Relation Age of Onset  . Cancer Paternal Grandfather   . Diabetes Sister   . Diabetes Sister   . Heart disease Maternal Grandmother   . Heart disease Father   . Hyperlipidemia     Scheduled Meds: . sodium chloride   Intravenous Once  . antiseptic oral rinse  7 mL Mouth Rinse QID  . chlorhexidine  gluconate  15 mL Mouth Rinse BID  . Chlorhexidine Gluconate Cloth  6 each Topical Q0600  . diltiazem  15 mg Intravenous Once  . insulin aspart  0-9 Units Subcutaneous 6 times per day  . levETIRAcetam  1,000 mg Intravenous Q12H  . mupirocin ointment  1 application Nasal BID  . pantoprazole (PROTONIX) IV  40 mg Intravenous Q12H  . phosphorus  250 mg Oral BID  . sodium chloride  3 mL Intravenous Q12H   Continuous Infusions: . Marland KitchenTPN (CLINIMIX-E) Adult 20 mL/hr at 12/05/14 1644  . Marland KitchenTPN (CLINIMIX-E) Adult    . dextrose 5 % and 0.45 %  NaCl with KCl 40 mEq/L 125 mL/hr at 12/06/14 1600  . diltiazem (CARDIZEM) infusion 7.5 mg/hr (12/06/14 1618)  . fentaNYL infusion INTRAVENOUS 50 mcg/hr (12/06/14 1523)   PRN Meds:.fentaNYL, fentaNYL (SUBLIMAZE) injection, fentaNYL (SUBLIMAZE) injection, midazolam, midazolam Medications Prior to Admission:  Prior to Admission medications   Medication Sig Start Date End Date Taking? Authorizing Provider  aspirin EC 81 MG tablet Take 81 mg by mouth daily.   Yes Historical Provider, MD  bisacodyl (DULCOLAX) 5 MG EC tablet Take 5 mg by mouth daily as needed for moderate constipation.   Yes Historical Provider, MD  bumetanide (BUMEX) 1 MG tablet Take 1 mg by mouth daily.    Yes Historical Provider, MD  calcitRIOL (ROCALTROL) 0.25 MCG capsule Take 0.25 mcg by mouth every other day.   Yes Historical Provider, MD  cetirizine (ZYRTEC) 10 MG tablet Take 5 mg by mouth daily as needed for allergies.   Yes Historical Provider, MD  diphenhydramine-acetaminophen (TYLENOL PM) 25-500 MG TABS tablet Take 1 tablet by mouth at bedtime as needed (sleep).   Yes Historical Provider, MD  docusate sodium (COLACE) 100 MG capsule Take 100 mg by mouth 2 (two) times daily.   Yes Historical Provider, MD  esomeprazole (NEXIUM) 40 MG capsule Take 40 mg by mouth daily before breakfast.     Yes Historical Provider, MD  fluticasone (FLONASE) 50 MCG/ACT nasal spray Place 1 spray into both nostrils  daily as needed for allergies.  03/24/14  Yes Historical Provider, MD  hydrocortisone cream 1 % Apply 1 application topically daily as needed for itching.   Yes Historical Provider, MD  LYRICA 75 MG capsule Take 75 mg by mouth 3 (three) times daily. 12/14/13  Yes Historical Provider, MD  magnesium oxide (MAG-OX) 400 MG tablet Take 400 mg by mouth daily.   Yes Historical Provider, MD  Melatonin 3 MG CAPS Take 3 mg by mouth at bedtime.   Yes Historical Provider, MD  metoprolol (TOPROL-XL) 200 MG 24 hr tablet Take 200 mg by mouth daily. Take along with 50 mg tablet to equal 200 mg.   Yes Historical Provider, MD  metoprolol succinate (TOPROL-XL) 100 MG 24 hr tablet Take 1 tablet (100 mg total) by mouth daily. Take with or immediately following a meal. 08/27/14  Yes [name], MD  metoprolol succinate (TOPROL-XL) 50 MG 24 hr tablet Take 50 mg by mouth daily. Take along with 200 mg tablet to equal 250 mg daily.   Yes Historical Provider, MD  montelukast (SINGULAIR) 10 MG tablet Take 10 mg by mouth at bedtime.     Yes Historical Provider, MD  Multiple Vitamins-Minerals (MULTIVITAMINS THER. W/MINERALS) TABS Take 1 tablet by mouth daily.     Yes Historical Provider, MD  oxyCODONE-acetaminophen (PERCOCET) 10-325 MG per tablet Take 1 tablet by mouth every 6 (six) hours as needed for pain. 11/03/13  Yes Carmin Muskrat, MD  polyethylene glycol [facility] / GLYCOLAX) packet Take 17 g by mouth daily as needed (constipation).    Yes Historical Provider, MD  PROAIR HFA 108 (90 BASE) MCG/ACT inhaler Take 2 puffs by mouth every 6 (six) hours as needed. 04/11/14  Yes Historical Provider, MD  sertraline (ZOLOFT) 50 MG tablet Take 100 mg by mouth at bedtime.   Yes Historical Provider, MD  clonazePAM (KLONOPIN) 0.5 MG tablet Take 1 tablet by mouth twice daily x 30 days. (use for Klonopin) Patient not taking: Reported on 12/04/2014 10/08/12   [name], DO  sodium bicarbonate 650 MG  tablet Take 1 tablet (650 mg total)  by mouth 2 (two) times daily. Patient not taking: Reported on 12/04/2014 08/27/14   [name], MD   Allergies  Allergen Reactions  . Mometasone Shortness Of Breath  . Vancomycin Itching  . Aspirin Other (See Comments)    "Inflames stomach"  . Contrast Media [Iodinated Diagnostic Agents] Other (See Comments)    Made Heart Stop.   . Cortisone Other (See Comments)    Hold fluid  . Doxycycline Nausea And Vomiting  . Ibuprofen Nausea And Vomiting  . Insulins Other (See Comments)    Pt says even the tiniest bit of insulin makes her go unconscious because her BS gets too low  . Lasix [Furosemide] Other (See Comments)    Paradoxical Response  . Other     Iv bp med unknown.and adhesive tape-silicones  . Prednisone     Sweating   . Sulfamethoxazole Other (See Comments)    Bottomed out platelets  . Tape   . Ultram [Tramadol Hcl] Other (See Comments)    "Grand mal seizure"  . Codeine Rash  . Dilantin [Phenytoin Sodium] Rash  . Latex Rash  . Piperacillin Sod-Tazobactam So Rash   CBC:    Component Value Date/Time   WBC 6.8 12/06/2014 0426   HGB 9.3* 12/06/2014 0426   HCT 26.7* 12/06/2014 0426   PLT 35* 12/06/2014 0426   MCV 91.4 12/06/2014 0426   NEUTROABS 4.8 12/05/2014 0430   LYMPHSABS 0.4* 12/05/2014 0430   MONOABS 0.2 12/05/2014 0430   EOSABS 0.1 12/05/2014 0430   BASOSABS 0.0 12/05/2014 0430   Comprehensive Metabolic Panel:    Component Value Date/Time   NA 140 12/06/2014 0426   K 4.1 12/06/2014 0426   CL 118* 12/06/2014 0426   CO2 16* 12/06/2014 0426   BUN 37* 12/06/2014 0426   CREATININE 1.49* 12/06/2014 0426   GLUCOSE 136* 12/06/2014 0426   CALCIUM 6.6* 12/06/2014 0426   AST 44* 12/06/2014 0426   ALT 22 12/06/2014 0426   ALKPHOS 114 12/06/2014 0426   BILITOT 1.2 12/06/2014 0426   PROT 4.9* 12/06/2014 0426   ALBUMIN 2.3* 12/06/2014 0426    Physical Exam: Vital Signs: BP 99/68 mmHg  Pulse 105  Temp(Src) 99.3 F (37.4 C) (Core (Comment))  Resp 19   Ht 4' 7"  (1.397 m)  Wt 60.5 kg (133 lb 6.1 oz)  BMI 31.00 kg/m2  SpO2 100% SpO2: SpO2: 100 % O2 Device: O2 Device: Ventilator O2 Flow Rate: O2 Flow Rate (L/min): 0 L/min Intake/output summary:  Intake/Output Summary (Last 24 hours) at 12/06/14 1734 Last data filed at 12/06/14 1600  Gross per 24 hour  Intake 1758.92 ml  Output    750 ml  Net 1008.92 ml   LBM: Last BM Date: 12/05/14 Baseline Weight: Weight: 56.7 kg (125 lb) Most recent weight: Weight: 60.5 kg (133 lb 6.1 oz)  Exam Findings:  Constitutional:  Frail, lying in bed, ventilated.  Resp: Ventilated, rhonchi bl  Cardio:  A fib, rate 90-160's.  GI: abd soft, rounded.          Palliative Performance Scale: 10% at this time.               Additional Data Reviewed: Recent Labs     12/05/14  0430  12/05/14  1034  12/06/14  0426  WBC  5.5   --   6.8  HGB  10.2*   --   9.3*  PLT  47*   --  35*  NA  146*  147*  140  BUN  47*  46*  37*  CREATININE  1.81*  1.77*  1.49*     Time In: 1300 Time Out: 1420 Time Total: 80 minutes Greater than 50%  of this time was spent counseling and coordinating care related to the above assessment and plan.  Signed by: Drue Novel, NP  Drue Novel, NP  12/06/2014, 5:34 PM  Please contact Palliative Medicine Team phone at [phone] for questions and concerns.

## 2014-12-07 DIAGNOSIS — R40242 Glasgow coma scale score 9-12, unspecified time: Secondary | ICD-10-CM

## 2014-12-07 LAB — TYPE AND SCREEN
ABO/RH(D): A POS
ANTIBODY SCREEN: NEGATIVE
UNIT DIVISION: 0
UNIT DIVISION: 0
Unit division: 0

## 2014-12-07 LAB — BLOOD GAS, ARTERIAL
Acid-Base Excess: 13.7 mmol/L — ABNORMAL HIGH (ref 0.0–2.0)
BICARBONATE: 14.4 meq/L — AB (ref 20.0–24.0)
DRAWN BY: 22223
FIO2: 35
MECHVT: 500 mL
O2 SAT: 98.5 %
PATIENT TEMPERATURE: 37
PCO2 ART: 19.1 mmHg — AB (ref 35.0–45.0)
PEEP/CPAP: 5 cmH2O
PO2 ART: 126 mmHg — AB (ref 80.0–100.0)
RATE: 20 resp/min
TCO2: 13.8 mmol/L (ref 0–100)
pH, Arterial: 7.368 (ref 7.350–7.450)

## 2014-12-07 LAB — CSF CULTURE

## 2014-12-07 LAB — GLUCOSE, CAPILLARY
GLUCOSE-CAPILLARY: 116 mg/dL — AB (ref 65–99)
GLUCOSE-CAPILLARY: 150 mg/dL — AB (ref 65–99)
GLUCOSE-CAPILLARY: 201 mg/dL — AB (ref 65–99)
GLUCOSE-CAPILLARY: 98 mg/dL (ref 65–99)
Glucose-Capillary: 105 mg/dL — ABNORMAL HIGH (ref 65–99)
Glucose-Capillary: 117 mg/dL — ABNORMAL HIGH (ref 65–99)
Glucose-Capillary: 156 mg/dL — ABNORMAL HIGH (ref 65–99)

## 2014-12-07 LAB — BASIC METABOLIC PANEL
Anion gap: 5 (ref 5–15)
BUN: 31 mg/dL — AB (ref 6–20)
CALCIUM: 6.7 mg/dL — AB (ref 8.9–10.3)
CO2: 15 mmol/L — ABNORMAL LOW (ref 22–32)
Chloride: 115 mmol/L — ABNORMAL HIGH (ref 101–111)
Creatinine, Ser: 1.31 mg/dL — ABNORMAL HIGH (ref 0.44–1.00)
GFR calc Af Amer: 47 mL/min — ABNORMAL LOW (ref >60)
GFR, EST NON AFRICAN AMERICAN: 41 mL/min — AB (ref >60)
GLUCOSE: 161 mg/dL — AB (ref 65–99)
POTASSIUM: 5.7 mmol/L — AB (ref 3.5–5.1)
SODIUM: 135 mmol/L (ref 135–145)

## 2014-12-07 LAB — CBC
HEMATOCRIT: 29.7 % — AB (ref 36.0–46.0)
HEMOGLOBIN: 10 g/dL — AB (ref 12.0–15.0)
MCH: 31.5 pg (ref 26.0–34.0)
MCHC: 33.7 g/dL (ref 30.0–36.0)
MCV: 93.7 fL (ref 78.0–100.0)
Platelets: 34 10*3/uL — ABNORMAL LOW (ref 150–400)
RBC: 3.17 MIL/uL — AB (ref 3.87–5.11)
RDW: 23.3 % — ABNORMAL HIGH (ref 11.5–15.5)
WBC: 7.7 10*3/uL (ref 4.0–10.5)

## 2014-12-07 LAB — PHOSPHORUS: PHOSPHORUS: 1.7 mg/dL — AB (ref 2.5–4.6)

## 2014-12-07 LAB — CSF CULTURE W GRAM STAIN: Culture: NO GROWTH

## 2014-12-07 LAB — MAGNESIUM: MAGNESIUM: 1.7 mg/dL (ref 1.7–2.4)

## 2014-12-07 MED ORDER — SODIUM CHLORIDE 0.9 % IV SOLN
2.0000 g | Freq: Once | INTRAVENOUS | Status: AC
  Administered 2014-12-07: 2 g via INTRAVENOUS
  Filled 2014-12-07: qty 20

## 2014-12-07 MED ORDER — LACOSAMIDE 200 MG/20ML IV SOLN
INTRAVENOUS | Status: AC
  Filled 2014-12-07: qty 20

## 2014-12-07 MED ORDER — DEXTROSE-NACL 5-0.45 % IV SOLN
INTRAVENOUS | Status: DC
  Administered 2014-12-07 – 2014-12-08 (×3): via INTRAVENOUS
  Administered 2014-12-09: 1000 mL via INTRAVENOUS
  Administered 2014-12-10: 10:00:00 via INTRAVENOUS

## 2014-12-07 MED ORDER — SODIUM POLYSTYRENE SULFONATE 15 GM/60ML PO SUSP
15.0000 g | Freq: Once | ORAL | Status: AC
  Administered 2014-12-07: 15 g via RECTAL
  Filled 2014-12-07: qty 60

## 2014-12-07 MED ORDER — SODIUM PHOSPHATE 3 MMOLE/ML IV SOLN
15.0000 mmol | Freq: Once | INTRAVENOUS | Status: AC
  Administered 2014-12-07: 15 mmol via INTRAVENOUS
  Filled 2014-12-07: qty 5

## 2014-12-07 MED ORDER — TRACE MINERALS CR-CU-MN-SE-ZN 10-1000-500-60 MCG/ML IV SOLN
INTRAVENOUS | Status: DC
  Administered 2014-12-07: 18:00:00 via INTRAVENOUS
  Filled 2014-12-07: qty 960

## 2014-12-07 MED ORDER — ACETAMINOPHEN 650 MG RE SUPP
650.0000 mg | RECTAL | Status: DC | PRN
Start: 2014-12-07 — End: 2014-12-18
  Administered 2014-12-07 – 2014-12-09 (×2): 650 mg via RECTAL
  Filled 2014-12-07 (×2): qty 1

## 2014-12-07 MED ORDER — SODIUM POLYSTYRENE SULFONATE 15 GM/60ML PO SUSP: 15.0000 g | Freq: Once | ORAL | Status: DC

## 2014-12-07 MED ORDER — SODIUM CHLORIDE 0.9 % IV SOLN
50.0000 mg | Freq: Two times a day (BID) | INTRAVENOUS | Status: DC
  Administered 2014-12-07 – 2014-12-14 (×15): 50 mg via INTRAVENOUS
  Filled 2014-12-07 (×17): qty 5

## 2014-12-07 NOTE — Progress Notes (Signed)
Nutrition Follow-up  DOCUMENTATION CODES:  Not applicable  INTERVENTION:  TPN per pharmacy  Once deemed safe to place NGT on pt, recommend Enteral nutritional-TF: Vital AF 1.2 @ 10 ml/hr via NGT/OGT and increase by 10 ml every 4 hours to goal rate of 45 ml/hr.   Tube feeding regimen provides 1296 kcal (96% of needs), 81 grams of protein, and 876 ml of H2O.   NUTRITION DIAGNOSIS:  Inadequate oral intake related to inability to eat as evidenced by NPO status.  ongoing  GOAL:  Provide needs based on ASPEN/SCCM guidelines  MONITOR:  Vent status, Labs, Weight trends  ASSESSMENT:  69 y.o. female with hx of CKD, DM, HTN, Depression, Anxiety, GERD, PUD, breast cancer with bone mets and CHF who was found by her son at home after three days, unclear how long she was down. EGD eval for hemmorhagic shock. 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   Interval hx 11/28-12/1: Per GI-No ng tube until 12/1. Poor prognosis.  Patient is currently intubated on ventilator support MV: 13.2 L/min Temp (24hrs), Avg:99.2 F (37.3 C), Min:98.8 F (37.1 C), Max:99.9 F (37.7 C)  Propofol: None  Spoke with current Attending. GI still cautious to insert NGT given mallory-weiss tear. Will continue to hold off EN for now, but hopefully transition in near future.   Briefly talked to family. They Confirmed info obtained by other RD. Was also able to obtain info that pt did not follow a DM diet, though had dx of DM and had been suffering from recent diarrhea. Unsure of exact duration.   Abdomen: Soft, non distended.   Diet Order:  Diet NPO time specified .TPN (CLINIMIX-E) Adult .TPN (CLINIMIX-E) Adult  Skin:  Dry  Last BM:  11/30-loose/watery  Height:  Ht Readings from Last 1 Encounters:  12/05/14 4\' 7"  (1.397 m)   Weight:  Wt Readings from Last 1 Encounters:  12/07/14 143 lb 15.4 oz (65.3 kg)   Wt Readings from Last 10 Encounters:  12/07/14 143 lb 15.4 oz (65.3 kg)  09/22/14  125 lb 12.8 oz (57.063 kg)  08/22/14 138 lb 7.2 oz (62.8 kg)  03/04/14 118 lb (53.524 kg)  01/20/14 118 lb (53.524 kg)  11/03/13 106 lb (48.081 kg)  09/25/12 106 lb (48.081 kg)  05/22/12 105 lb (47.628 kg)  08/18/11 112 lb 14 oz (51.2 kg)  08/16/11 105 lb (47.628 kg)  Dosing weight:  115 lbs (52.27 kg)  Ideal Body Weight:  41.6 kg  BMI:  Body mass index is 33.46 kg/(m^2).  Estimated Nutritional Needs:  Kcal:  1357 kcals Protein:  75-84 g (1.8-2 g/kg ibw) Fluid:  Per MD  EDUCATION NEEDS:  Education needs no appropriate at this time  Burtis Junes RD, LDN Nutrition Pager: [phone] 12/07/2014 1:10 PM

## 2014-12-07 NOTE — Progress Notes (Signed)
Patient ID: Catherine Mccullough, female   DOB: 1945-04-29, 69 y.o.   MRN: [id number]  [place] A. [name], MD     [email or web address]          Catherine Mccullough is an 69 y.o. female.   Assessment/Plan: Acute severe encephalopathy of unclear etiology but associated with very high CSF protein, seizures and hypothermia. CSF analysis and other testing does not support diagnosis of infection at this time. The differential diagnosis for very high CSF protein includes bacterial related meningitis, TB meningitis, brain tumors, intracranial trauma and intracranial hemorrhage. Given the seizures, I think this is the most likely etiology however. 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  Thrombocytopenia -  Multifactorial but Keppra very rarely can cause thrombocytopenia.  RECOMMENDATION: Will add vimpat for better seizure control. DC Keppra IV. Imaging of the brain with IV contrast once the patient is stable. MRI is preferable. Avoid sedatives.    The patient has been less responsive today. She also however is on as needed doses of fentanyl. She has significant reductions and platelet count down from 88 progressively to the 20,000 range. I suspect this is multifactorial but Keppra may be a wall the culprit. We will therefore discontinue this medication and continue with the Vimpat. The dose of Vimpat may need to be increased however. The nurse reports that the patient was also attempted to be weaned today but she seems to become tachypneic, tachycardic and restless on attempted weans. It appears the family has agreed to accept the patient on Saturday and see how things go.   HEENT: Supple. Atraumatic normocephalic.   ABDOMEN: soft  EXTREMITIES: No edema   BACK: Normal.  SKIN:  Normal by inspection.   MENTAL STATUS: She is less responsive today but eyes are open. She focuses and tracks but does not follow commands. She is quite edematous globally suggestive of anasarca.  CRANIAL NERVES: The pupils are deviated to the left and downward;  upper and lower facial muscles are normal in strength and symmetric, there is no flattening of the nasolabial folds. Cough and gag reflexes are intact. She has full extraocular movements. No nystagmus is appreciated.  MOTOR: Bulk and tone are normal throughout.  She has antigravity strength of the upper extremities.  COORDINATION: No tremors or dysmetria is appreciated.  REFLEXES: Deep tendon reflexes are symmetrical and normal. Babinski reflexes are flexor on the right but upgoing on the left  SENSATION: She responds to deep painful stimuli bilaterally   EEG shows moderate to severe global slowing. No epileptiform activities.   Objective: Vital signs in last 24 hours: Temp:  [99 F (37.2 C)-99.9 F (37.7 C)] 99.9 F (37.7 C) (12/01 1100) Pulse Rate:  [91-107] 106 (12/01 1000) Resp:  [12-29] 23 (12/01 1100) BP: (80-108)/(56-83) 93/66 mmHg (12/01 1100) SpO2:  [98 %-100 %] 100 % (12/01 1000) FiO2 (%):  [35 %] 35 % (12/01 1230) Weight:  [65.3 kg (143 lb 15.4 oz)] 65.3 kg (143 lb 15.4 oz) (12/01 0500)  Intake/Output from previous day: 11/30 0701 - 12/01 0700 In: 2279.6 [I.V.:1170.2; IV Piggyback:606.7; TPN:502.7] Out: 1050 [Urine:1050] Intake/Output this shift:   Nutritional status: Diet NPO time specified .TPN (CLINIMIX-E) Adult .TPN (CLINIMIX-E) Adult   Lab Results: Results for orders placed or performed during the hospital encounter of 12/03/14 (from the past 48 hour(s))  Glucose, capillary     Status: Abnormal   Collection Time: 12/05/14  8:17 PM  Result Value Ref Range   Glucose-Capillary 114 (H) 65 - 99 mg/dL   Comment 1 Notify RN    Comment 2 Document in Chart   Glucose, capillary     Status: Abnormal     Collection Time: 12/06/14 12:24 AM  Result Value Ref Range   Glucose-Capillary 112 (H) 65 - 99 mg/dL  Blood gas, arterial     Status: Abnormal   Collection Time: 12/06/14  3:06 AM  Result Value Ref Range   FIO2 0.35    Delivery systems VENTILATOR    Mode PRESSURE REGULATED VOLUME CONTROL    VT 500 mL   LHR 20.0 resp/min   Peep/cpap 5.0 cm H20   pH, Arterial 7.414 7.350 - 7.450   pCO2 arterial 21.1 (L) 35.0 - 45.0 mmHg   pO2, Arterial 182 (H) 80.0 - 100.0 mmHg   Bicarbonate 16.8 (L) 20.0 - 24.0 mEq/L   Acid-base deficit 10.5 (H) 0.0 - 2.0 mmol/L   O2 Saturation 99.5 %   Collection site LEFT RADIAL    Drawn by [id number]    Sample type ARTERIAL DRAW    Allens test (pass/fail) PASS PASS  Comprehensive metabolic panel     Status: Abnormal   Collection Time: 12/06/14  4:26 AM  Result Value Ref Range   Sodium 140 135 - 145 mmol/L   Potassium 4.1 3.5 - 5.1 mmol/L    Comment: DELTA CHECK NOTED   Chloride 118 (H) 101 - 111 mmol/L   CO2 16 (L) 22 - 32 mmol/L   Glucose, Bld 136 (H) 65 - 99 mg/dL   BUN 37 (H) 6 - 20 mg/dL   Creatinine, Ser 1.49 (H) 0.44 - 1.00 mg/dL   Calcium 6.6 (L) 8.9 - 10.3 mg/dL   Total Protein 4.9 (L) 6.5 - 8.1 g/dL   Albumin 2.3 (L) 3.5 - 5.0 g/dL   AST 44 (H) 15 - 41 U/L   ALT 22 14 - 54 U/L   Alkaline Phosphatase 114 38 - 126 U/L   Total Bilirubin 1.2 0.3 - 1.2 mg/dL   GFR calc non Af Amer 35 (L) >60 mL/min   GFR calc Af Amer 40 (L) >60 mL/min    Comment: (NOTE) The eGFR has been calculated using the CKD EPI equation. This calculation has not been validated in all clinical situations. eGFR's persistently <60 mL/min signify possible Chronic Kidney Disease.    Anion gap 6 5 - 15  CBC     Status: Abnormal   Collection Time: 12/06/14  4:26 AM  Result Value Ref Range   WBC 6.8 4.0 - 10.5 K/uL   RBC 2.92 (L) 3.87 - 5.11 MIL/uL   Hemoglobin 9.3 (L) 12.0 - 15.0 g/dL   HCT 26.7 (L) 36.0 - 46.0 %   MCV 91.4 78.0 - 100.0 fL   MCH 31.8 26.0 - 34.0 pg   MCHC  34.8 30.0 - 36.0 g/dL   RDW 22.7 (H) 11.5 - 15.5 %   Platelets 35 (L) 150 - 400 K/uL    Comment: SPECIMEN CHECKED FOR CLOTS PLATELET COUNT CONFIRMED BY SMEAR   Phosphorus     Status: Abnormal   Collection Time: 12/06/14  4:26 AM  Result Value Ref Range   Phosphorus <1.0 (LL) 2.5 - 4.6 mg/dL    Comment: CRITICAL RESULT CALLED TO, READ BACK BY AND VERIFIED WITH: HAMMOCK,S AT 7:00AM ON 12/06/14 BY Derrill Memo  Magnesium     Status: None   Collection Time: 12/06/14  4:26 AM  Result Value Ref Range   Magnesium 1.7 1.7 - 2.4 mg/dL  Glucose, capillary     Status: Abnormal   Collection Time: 12/06/14  5:35 AM  Result Value Ref Range   Glucose-Capillary 127 (H) 65 - 99 mg/dL  Glucose, capillary     Status: Abnormal   Collection Time: 12/06/14  7:54 AM  Result Value Ref Range   Glucose-Capillary 125 (H) 65 - 99 mg/dL   Comment 1 Document in Chart   TSH     Status: None   Collection Time: 12/06/14 10:02 AM  Result Value Ref Range   TSH 1.388 0.350 - 4.500 uIU/mL  Glucose, capillary     Status: Abnormal   Collection Time: 12/06/14 11:33 AM  Result Value Ref Range   Glucose-Capillary 196 (H) 65 - 99 mg/dL   Comment 1 Notify RN    Comment 2 Document in Chart   Glucose, capillary     Status: Abnormal   Collection Time: 12/06/14  4:22 PM  Result Value Ref Range   Glucose-Capillary 192 (H) 65 - 99 mg/dL   Comment 1 Notify RN    Comment 2 Document in Chart   Glucose, capillary     Status: Abnormal   Collection Time: 12/06/14  7:58 PM  Result Value Ref Range   Glucose-Capillary 173 (H) 65 - 99 mg/dL   Comment 1 Document in Chart   Glucose, capillary     Status: Abnormal   Collection Time: 12/07/14 12:16 AM  Result Value Ref Range   Glucose-Capillary 201 (H) 65 - 99 mg/dL   Comment 1 Notify RN   Blood gas, arterial     Status: Abnormal   Collection Time: 12/07/14  3:30 AM  Result Value Ref Range   FIO2 35.00    Delivery systems VENTILATOR    Mode PRESSURE REGULATED VOLUME  CONTROL    VT 500 mL   LHR 20 resp/min   Peep/cpap 5.0 cm H20   pH, Arterial 7.368 7.350 - 7.450   pCO2 arterial 19.1 (LL) 35.0 - 45.0 mmHg    Comment: CRITICAL RESULT CALLED TO, READ BACK BY AND VERIFIED WITH:  JESSICA H. RN BY K KNICK RRT RCP ON 12/07/2014 AT 0335    pO2, Arterial 126 (H) 80.0 - 100.0 mmHg   Bicarbonate 14.4 (L) 20.0 - 24.0 mEq/L   TCO2 13.8 0 - 100 mmol/L   Acid-Base Excess 13.7 (H) 0.0 - 2.0 mmol/L   O2 Saturation 98.5 %   Patient temperature 37.0    Collection site LEFT RADIAL    Drawn by [id number]    Sample type ARTERIAL    Allens test (pass/fail) PASS PASS  Glucose, capillary     Status: Abnormal   Collection Time: 12/07/14  4:33 AM  Result Value Ref Range   Glucose-Capillary 156 (H) 65 - 99 mg/dL  Basic metabolic panel     Status: Abnormal   Collection Time: 12/07/14  5:00 AM  Result Value Ref Range   Sodium 135 135 - 145 mmol/L   Potassium 5.7 (H) 3.5 - 5.1 mmol/L    Comment: DELTA CHECK NOTED   Chloride 115 (H) 101 - 111 mmol/L   CO2 15 (L) 22 - 32 mmol/L   Glucose, Bld 161 (H) 65 - 99 mg/dL   BUN 31 (H) 6 - 20 mg/dL   Creatinine, Ser 1.31 (H) 0.44 - 1.00 mg/dL  Calcium 6.7 (L) 8.9 - 10.3 mg/dL   GFR calc non Af Amer 41 (L) >60 mL/min   GFR calc Af Amer 47 (L) >60 mL/min    Comment: (NOTE) The eGFR has been calculated using the CKD EPI equation. This calculation has not been validated in all clinical situations. eGFR's persistently <60 mL/min signify possible Chronic Kidney Disease.    Anion gap 5 5 - 15  CBC     Status: Abnormal   Collection Time: 12/07/14  5:00 AM  Result Value Ref Range   WBC 7.7 4.0 - 10.5 K/uL   RBC 3.17 (L) 3.87 - 5.11 MIL/uL   Hemoglobin 10.0 (L) 12.0 - 15.0 g/dL   HCT 29.7 (L) 36.0 - 46.0 %   MCV 93.7 78.0 - 100.0 fL   MCH 31.5 26.0 - 34.0 pg   MCHC 33.7 30.0 - 36.0 g/dL   RDW 23.3 (H) 11.5 - 15.5 %   Platelets 34 (L) 150 - 400 K/uL    Comment: SPECIMEN CHECKED FOR CLOTS CONSISTENT WITH PREVIOUS RESULT     Magnesium     Status: None   Collection Time: 12/07/14  5:00 AM  Result Value Ref Range   Magnesium 1.7 1.7 - 2.4 mg/dL  Phosphorus     Status: Abnormal   Collection Time: 12/07/14  5:00 AM  Result Value Ref Range   Phosphorus 1.7 (L) 2.5 - 4.6 mg/dL  Glucose, capillary     Status: Abnormal   Collection Time: 12/07/14  7:48 AM  Result Value Ref Range   Glucose-Capillary 150 (H) 65 - 99 mg/dL   Comment 1 Notify RN    Comment 2 Document in Chart   Glucose, capillary     Status: Abnormal   Collection Time: 12/07/14 11:52 AM  Result Value Ref Range   Glucose-Capillary 116 (H) 65 - 99 mg/dL   Comment 1 Notify RN    Comment 2 Document in Chart   Glucose, capillary     Status: Abnormal   Collection Time: 12/07/14  4:46 PM  Result Value Ref Range   Glucose-Capillary 105 (H) 65 - 99 mg/dL   Comment 1 Notify RN    Comment 2 Document in Chart     Lipid Panel  Recent Labs  12/05/14 0430  TRIG 430*    Studies/Results:   Medications:  Scheduled Meds: . sodium chloride   Intravenous Once  . antiseptic oral rinse  7 mL Mouth Rinse QID  . chlorhexidine gluconate  15 mL Mouth Rinse BID  . Chlorhexidine Gluconate Cloth  6 each Topical Q0600  . diltiazem  15 mg Intravenous Once  . insulin aspart  0-9 Units Subcutaneous 6 times per day  . lacosamide (VIMPAT) IV  50 mg Intravenous Q12H  . levETIRAcetam  1,000 mg Intravenous Q12H  . mupirocin ointment  1 application Nasal BID  . pantoprazole (PROTONIX) IV  40 mg Intravenous Q12H  . sodium chloride  3 mL Intravenous Q12H   Continuous Infusions: . Marland KitchenTPN (CLINIMIX-E) Adult 40 mL/hr at 12/06/14 1752  . Marland KitchenTPN (CLINIMIX-E) Adult    . dextrose 5 % and 0.45% NaCl 85 mL/hr (12/07/14 0751)  . diltiazem (CARDIZEM) infusion 12.5 mg/hr (12/07/14 1645)  . fentaNYL infusion INTRAVENOUS 50 mcg/hr (12/07/14 1240)   PRN Meds:.fentaNYL, fentaNYL (SUBLIMAZE) injection, fentaNYL (SUBLIMAZE) injection, midazolam, midazolam     LOS: 4 days   Mabeline Varas  A. [name], M.D.  Diplomate, Tax adviser of Psychiatry and Neurology ( Neurology).

## 2014-12-07 NOTE — Progress Notes (Addendum)
[facility] PROGRESS NOTE  [name] [id number] DOB: [birth year] DOA: 12/03/2014 PCP: Default, Provider, MD  Assessment/Plan: Acute encephalopathy -Patient was brought into the hospital after being down for what is thought to be 3 days. -Neurology is on board. -We seem to believe currently that etiology is her seizures. -Patient had a lumbar puncture that was not indicative for infection although she did have a really high total protein. -Not very responsive this Catherine but is on a fentanyl drip.  Ventilatory dependent acute hypoxic and hypercarbic respiratory failure -Appreciate Dr. Luan Pulling input and recommendations. -Given severity of disease, will request palliative care consultation for goals of care. -Will try to wean sedation today in attempts to start weaning.  Hypothermia -Temperature on admission was 90. -She is currently normothermic.  Hypo-tension -Blood pressure is currently stable without pressors.  Hypokalemia -DC all K containing IVF. -Give kayexalate 15 gr. -Recheck in Catherine.  Acute on Chronic kidney disease stage III -Baseline creatinine is around 1.4-1.7. -Was 2.5 on admission, is currently better than her baseline at 1.3 on 12/1.  Atrial fibrillation with rapid ventricular response -Rate controlled on cardizem drip. Can consider transitioning to PO once extubated. -2-D echo with markedly reduced EF of 25% with multiple wall motion abnormalities. This is a change from her prior ECHO in August. Discussed these findings with son, [name]. If ever able to successfully extubate will need to consider cardiology evaluation for ischemic work up.  Seizure activity -Continue high-dose IV Keppra and vimpat as recommended by neurology.  -Consider MRI once extubated (MRI at [facility] unable to accommodate ventilator apparatus).  Upper GI bleed -Due to Mallory-Weiss tear status post emergent bedside EGD on 11/27 with placement of 2 clips. -No further evidence  of continued GI bleed. -Hemoglobin stable 12/1 at 10.0, received a total of 2 units of PRBCs this admission. -Appreciate GI input and recommendations.  Nutrition -Continue TPN given prolonged ventilator stay.  ? Presumed sepsis -No evidence for active infection: Chest x-ray negative, urine, blood, CSF cultures negative to date, negative urinalysis. -I believe her presentation with hypothermia, hypotension were more due to seizure activity and prolonged fallen state than to infection. -She remains afebrile and without leukocytosis. -Off all antibiotics presently.      Code Status: Remains full code for now Family Communication: Aunt at bedside Disposition Plan: Keep in ICU, remains mechanically ventilated. Patient is critically ill and requires high level of care.  Consultants:  Neurology  Pulmonology  Gastroenterology   Antibiotics:  None   Subjective: Fentanyl drip turned off about 30 minutes prior to my evaluation. She still remains unresponsive with a fixed gaze.  Objective: Filed Vitals:   12/07/14 0400 12/07/14 0500 12/07/14 0600 12/07/14 0700  BP: 95/64 94/66 100/66 93/66  Pulse: 95 96 99 98  Temp: 99.3 F (37.4 C) 99.5 F (37.5 C) 99.5 F (37.5 C) 99.5 F (37.5 C)  TempSrc:      Resp: 12 15 18 20   Height:      Weight:  65.3 kg (143 lb 15.4 oz)    SpO2: 100% 100% 100% 100%    Intake/Output Summary (Last 24 hours) at 12/07/14 1017 Last data filed at 12/07/14 0500  Gross per 24 hour  Intake 1954.55 ml  Output   1050 ml  Net 904.55 ml   Filed Weights   12/05/14 0400 12/06/14 0500 12/07/14 0500  Weight: 52.1 kg (114 lb 13.8 oz) 60.5 kg (133 lb 6.1 oz) 65.3 kg (143 lb 15.4 oz)  Exam:   General:  Unable to follow commands, remains intubated  Cardiovascular: Very tachycardic, irregular unable to auscultate any murmurs  Respiratory: Coarse bilateral breath sounds with rhonchi and rails  Abdomen: Soft, nontender, nondistended, positive bowel  sounds  Extremities: Trace bilateral edema bilaterally   Neurologic:  Unable to fully assess given current mental state  Data Reviewed: Basic Metabolic Panel:  Recent Labs Lab 12/04/14 0450 12/04/14 1025 12/05/14 0430 12/05/14 1034 12/06/14 0426 12/07/14 0500  NA  --  148* 146* 147* 140 135  K  --  2.7* 2.8* 3.1* 4.1 5.7*  CL  --  125* 119* 119* 118* 115*  CO2  --  13* 17* 15* 16* 15*  GLUCOSE  --  139* 129* 101* 136* 161*  BUN  --  59* 47* 46* 37* 31*  CREATININE  --  2.13* 1.81* 1.77* 1.49* 1.31*  CALCIUM  --  7.1* 6.4* 6.5* 6.6* 6.7*  MG 1.9  --  1.3*  --  1.7 1.7  PHOS  --   --  1.0*  --  <1.0* 1.7*   Liver Function Tests:  Recent Labs Lab 12/03/14 1100 12/04/14 0115 12/05/14 0430 12/06/14 0426  AST 40 45* 61* 44*  ALT 20 18 22 22   ALKPHOS 107 85 98 114  BILITOT 0.8 0.8 1.3* 1.2  PROT 8.3* 5.3* 4.8* 4.9*  ALBUMIN 4.5 2.7* 2.3* 2.3*   No results for input(s): LIPASE, AMYLASE in the last 168 hours.  Recent Labs Lab 12/03/14 1100  AMMONIA 40*   CBC:  Recent Labs Lab 12/03/14 1100 12/04/14 0115 12/04/14 1025 12/05/14 0430 12/06/14 0426 12/07/14 0500  WBC 18.6* 2.1* 2.7* 5.5 6.8 7.7  NEUTROABS 17.0* 1.9  --  4.8  --   --   HGB 13.7 8.3* 8.5* 10.2* 9.3* 10.0*  HCT 40.5 23.7* 24.1* 29.1* 26.7* 29.7*  MCV 104.1* 100.4* 98.8 90.7 91.4 93.7  PLT 205 102* 86* 47* 35* 34*   Cardiac Enzymes:  Recent Labs Lab 12/03/14 1200 12/05/14 1034  CKTOTAL 356*  --   TROPONINI  --  0.28*   BNP (last 3 results)  Recent Labs  03/04/14 0726 08/21/14 0029  BNP 1040.0* 1557.0*    ProBNP (last 3 results) No results for input(s): PROBNP in the last 8760 hours.  CBG:  Recent Labs Lab 12/06/14 1622 12/06/14 1958 12/07/14 0016 12/07/14 0433 12/07/14 0748  GLUCAP 192* 173* 201* 156* 150*    Recent Results (from the past 240 hour(s))  Urine culture     Status: None   Collection Time: 12/03/14 11:00 Catherine  Result Value Ref Range Status   Specimen  Description URINE, CATHETERIZED  Final   Special Requests Normal  Final   Culture   Final    NO GROWTH 1 DAY Performed at [facility]    Report Status 12/04/2014 FINAL  Final  Culture, blood (routine x 2)     Status: None (Preliminary result)   Collection Time: 12/03/14 11:00 Catherine  Result Value Ref Range Status   Specimen Description BLOOD RIGHT ANTECUBITAL  Final   Special Requests BOTTLES DRAWN AEROBIC AND ANAEROBIC [place]  Final   Culture NO GROWTH 3 DAYS  Final   Report Status PENDING  Incomplete  Culture, blood (routine x 2)     Status: None (Preliminary result)   Collection Time: 12/03/14 11:20 Catherine  Result Value Ref Range Status   Specimen Description BLOOD RIGHT ANTECUBITAL  Final   Special Requests BOTTLES DRAWN AEROBIC AND  ANAEROBIC 6CC EACH  Final   Culture NO GROWTH 3 DAYS  Final   Report Status PENDING  Incomplete  CSF culture     Status: None (Preliminary result)   Collection Time: 12/03/14  4:29 PM  Result Value Ref Range Status   Specimen Description BACK  Final   Special Requests Immunocompromised  Final   Gram Stain   Final    CYTOSPIN SLIDE NO WBC SEEN NO ORGANISMS SEEN CONFIRMED BY R.GREENE    Culture   Final    NO GROWTH 3 DAYS Performed at [facility]    Report Status PENDING  Incomplete  Gram stain     Status: None   Collection Time: 12/03/14  4:29 PM  Result Value Ref Range Status   Specimen Description BACK  Final   Special Requests Immunocompromised  Final   Gram Stain NO ORGANISMS SEEN NO WBC SEEN   Final   Report Status 12/03/2014 FINAL  Final  MRSA PCR Screening     Status: Abnormal   Collection Time: 12/04/14  2:31 Catherine  Result Value Ref Range Status   MRSA by PCR POSITIVE (A) NEGATIVE Final    Comment:        The GeneXpert MRSA Assay (FDA approved for NASAL specimens only), is one component of a comprehensive MRSA colonization surveillance program. It is not intended to diagnose MRSA infection nor to guide or monitor  treatment for MRSA infections. RESULT CALLED TO, READ BACK BY AND VERIFIED WITH: Carron Curie ON [id number] AT 0610 BY RESSEGGER R      Studies: No results found.  Scheduled Meds: . sodium chloride   Intravenous Once  . antiseptic oral rinse  7 mL Mouth Rinse QID  . calcium gluconate  2 g Intravenous Once  . chlorhexidine gluconate  15 mL Mouth Rinse BID  . Chlorhexidine Gluconate Cloth  6 each Topical Q0600  . diltiazem  15 mg Intravenous Once  . insulin aspart  0-9 Units Subcutaneous 6 times per day  . levETIRAcetam  1,000 mg Intravenous Q12H  . mupirocin ointment  1 application Nasal BID  . pantoprazole (PROTONIX) IV  40 mg Intravenous Q12H  . sodium chloride  3 mL Intravenous Q12H  . sodium phosphate  Dextrose 5% IVPB  15 mmol Intravenous Once   Continuous Infusions: . Marland KitchenTPN (CLINIMIX-E) Adult 40 mL/hr at 12/06/14 1752  . Marland KitchenTPN (CLINIMIX-E) Adult    . dextrose 5 % and 0.45% NaCl 85 mL/hr (12/07/14 0751)  . diltiazem (CARDIZEM) infusion 7.5 mg/hr (12/07/14 0744)  . fentaNYL infusion INTRAVENOUS 50 mcg/hr (12/07/14 0752)    Principal Problem:   Altered mental status Active Problems:   Breast cancer metastasized to bone (HCC)   Dehydration   CKD (chronic kidney disease) stage 4, GFR 15-29 ml/min (HCC)   DM type 2 (diabetes mellitus, type 2) (HCC)   Mallory-Weiss tear   Encephalopathy acute   Respiratory failure ([place])   Seizure ([place])   Palliative care encounter   DNR (do not resuscitate) discussion    Critical care time spent: 45 minutes. Greater than 50% of this time was spent in direct contact with the patient coordinating care.    Lelon Frohlich  [facility] Pager [phone]  If 7PM-7AM, please contact night-coverage at [email or web address], password [facility] 12/07/2014, 10:17 Catherine  LOS: 4 days

## 2014-12-07 NOTE — Plan of Care (Signed)
Meeting today with daughter Catherine Mccullough and son Catherine Mccullough.  We talk about her chronic health conditions and her expected outcomes.  Catherine Mccullough tells me that she has been able to talk with her mother and ask if she wants to have the tube taken out, Catherine Mccullough agrees that she does.  Catherine Mccullough adds that if she wants to breath, and does, that is good, but if she doesn't want to breath that is ok too.  Lengthy discussion with Catherine Mccullough and Catherine Mccullough, who show great support and love towards one another. They decide to have a compassionate extubation on Saturday (to allow time for family to gather).  We discuss that it may seem we are "taking away" of some treatments that will not make a difference in her outcome, and we change her to DNR.  Catherine Mccullough questions whether her 54 year old daughter, Catherine Mccullough should be here (she wants to be).  We talk about the pros and cons and I encourage her to pray about this.

## 2014-12-07 NOTE — Progress Notes (Signed)
[place] NOTE   Pharmacy Consult for TPN Indication: prolonged NPO, s/p Mallory Weiss tear with no NG tube for 3 days  Allergies  Allergen Reactions  . Mometasone Shortness Of Breath  . Vancomycin Itching  . Aspirin Other (See Comments)    "Inflames stomach"  . Contrast Media [Iodinated Diagnostic Agents] Other (See Comments)    Made Heart Stop.   . Cortisone Other (See Comments)    Hold fluid  . Doxycycline Nausea And Vomiting  . Ibuprofen Nausea And Vomiting  . Insulins Other (See Comments)    Pt says even the tiniest bit of insulin makes her go unconscious because her BS gets too low  . Lasix [Furosemide] Other (See Comments)    Paradoxical Response  . Other     Iv bp med unknown.and adhesive tape-silicones  . Prednisone     Sweating   . Sulfamethoxazole Other (See Comments)    Bottomed out platelets  . Tape   . Ultram [Tramadol Hcl] Other (See Comments)    "Grand mal seizure"  . Codeine Rash  . Dilantin [Phenytoin Sodium] Rash  . Latex Rash  . Piperacillin Sod-Tazobactam So Rash    Patient Measurements: Height: 4' 7"  (139.7 cm) Weight: 143 lb 15.4 oz (65.3 kg) IBW/kg (Calculated) : 34  Vital Signs: Temp: 99.5 F (37.5 C) (12/01 0700) BP: 93/66 mmHg (12/01 0700) Pulse Rate: 98 (12/01 0700) Intake/Output from previous day: 11/30 0701 - 12/01 0700 In: 2279.6 [I.V.:1170.2; IV Piggyback:606.7; TPN:502.7] Out: 1050 [Urine:1050] Intake/Output from this shift:    Labs:  Recent Labs  12/04/14 1025 12/05/14 0430 12/06/14 0426 12/07/14 0500  WBC 2.7* 5.5 6.8 7.7  HGB 8.5* 10.2* 9.3* 10.0*  HCT 24.1* 29.1* 26.7* 29.7*  PLT 86* 47* 35* 34*  APTT 55*  --   --   --   INR 1.63*  --   --   --      Recent Labs  12/05/14 0430 12/05/14 1034 12/06/14 0426 12/07/14 0500  NA 146* 147* 140 135  K 2.8* 3.1* 4.1 5.7*  CL 119* 119* 118* 115*  CO2 17* 15* 16* 15*  GLUCOSE 129* 101* 136* 161*  BUN 47* 46* 37* 31*  CREATININE 1.81* 1.77*  1.49* 1.31*  CALCIUM 6.4* 6.5* 6.6* 6.7*  MG 1.3*  --  1.7 1.7  PHOS 1.0*  --  <1.0* 1.7*  PROT 4.8*  --  4.9*  --   ALBUMIN 2.3*  --  2.3*  --   AST 61*  --  44*  --   ALT 22  --  22  --   ALKPHOS 98  --  114  --   BILITOT 1.3*  --  1.2  --   PREALBUMIN 17.0*  --   --   --   TRIG 430*  --   --   --    Estimated Creatinine Clearance: 30.2 mL/min (by C-G formula based on Cr of 1.31).    Recent Labs  12/07/14 0016 12/07/14 0433 12/07/14 0748  GLUCAP 201* 156* 150*    Medical History: Past Medical History  Diagnosis Date  . Kidney disease   . Hypertension   . Diabetes mellitus   . Cancer ([place])     brest met to bone  . Arthritis   . CHF (congestive heart failure) ([place])   . Lymphedema of leg   . Anxiety   . Pulmonary hypertension ([place])   . Pulmonary valve insufficiency     group B  strep infection  . Pancreatitis   . Depression   . Anemia   . Shingles   . PUD (peptic ulcer disease)   . GERD (gastroesophageal reflux disease)   . MRSA (methicillin resistant staph aureus) culture positive     Insulin Requirements in the past 24 hours:   6 units ssi   Current Nutrition:  Clinimix 5/15 @ 40 ml/hr provides 48 gm protein and 681 Kcal/day  Assessment:  Pt is admitted for being down with unclear etiology and unknown how long, hypothermic, metabolic acidosis with incomplete respiratory compensation, hypotensive, hypovolemic, seizure, and septic. She developed upper GI Bleed and has no further evidence of bleeding.Started on TPN for prolonged NPO.  Prealb 17, TG 430 Her K is elevated this am, Phos is  1.7, CoCa 8.07  CBGs in acceptable range  Nutritional Goals:  Per RD 1424 Kcal and 78 g protein  Plan:  Cont Clinimix E 5/15 at 40 ml/hr  Will advance rate once electrolytes more stable D/C K from IV fluids Na Phos 15 mmol IV X 1 Calcium gluconate 2 gm IV X 1 No lipids for now F/u am labs  Thanks for allowing pharmacy to be a part of this patient's care.  Excell Seltzer, PharmD Clinical Pharmacist 12/07/2014,9:02 AM

## 2014-12-07 NOTE — Progress Notes (Signed)
Spoke with Dr Deterdine at [facility] about ABG. She did not want to make any changes at this time

## 2014-12-07 NOTE — Progress Notes (Signed)
Subjective: She is much less responsive this morning than yesterday. Her heart rate is better.  Objective: Vital signs in last 24 hours: Temp:  [98.8 F (37.1 C)-99.5 F (37.5 C)] 99.5 F (37.5 C) (12/01 0700) Pulse Rate:  [91-118] 98 (12/01 0700) Resp:  [12-29] 20 (12/01 0700) BP: (80-110)/(56-83) 93/66 mmHg (12/01 0700) SpO2:  [98 %-100 %] 100 % (12/01 0700) FiO2 (%):  [35 %] 35 % (12/01 0823) Weight:  [65.3 kg (143 lb 15.4 oz)] 65.3 kg (143 lb 15.4 oz) (12/01 0500) Weight change: 4.8 kg (10 lb 9.3 oz) Last BM Date: 12/06/14  Intake/Output from previous day: 11/30 0701 - 12/01 0700 In: 2279.6 [I.V.:1170.2; IV Piggyback:606.7; TPN:502.7] Out: 1050 [Urine:1050]  PHYSICAL EXAM General appearance: Intubated sedated and poorly responsive although her eyes are open Resp: rhonchi bilaterally Cardio: irregularly irregular rhythm GI: soft, non-tender; bowel sounds normal; no masses,  no organomegaly Extremities: extremities normal, atraumatic, no cyanosis or edema  Lab Results:  Results for orders placed or performed during the hospital encounter of 12/03/14 (from the past 48 hour(s))  Basic metabolic panel     Status: Abnormal   Collection Time: 12/05/14 10:34 AM  Result Value Ref Range   Sodium 147 (H) 135 - 145 mmol/Mccullough   Potassium 3.1 (Mccullough) 3.5 - 5.1 mmol/Mccullough   Chloride 119 (H) 101 - 111 mmol/Mccullough   CO2 15 (Mccullough) 22 - 32 mmol/Mccullough   Glucose, Bld 101 (H) 65 - 99 mg/dL   BUN 46 (H) 6 - 20 mg/dL   Creatinine, Ser 1.77 (H) 0.44 - 1.00 mg/dL   Calcium 6.5 (Mccullough) 8.9 - 10.3 mg/dL   GFR calc non Af Amer 28 (Mccullough) >60 mL/min   GFR calc Af Amer 33 (Mccullough) >60 mL/min    Comment: (NOTE) The eGFR has been calculated using the CKD EPI equation. This calculation has not been validated in all clinical situations. eGFR's persistently <60 mL/min signify possible Chronic Kidney Disease.    Anion gap 13 5 - 15  Troponin I     Status: Abnormal   Collection Time: 12/05/14 10:34 AM  Result Value Ref Range    Troponin I 0.28 (H) <0.031 ng/mL    Comment:        PERSISTENTLY INCREASED TROPONIN VALUES IN THE RANGE OF 0.04-0.49 ng/mL CAN BE SEEN IN:       -UNSTABLE ANGINA       -CONGESTIVE HEART FAILURE       -MYOCARDITIS       -CHEST TRAUMA       -ARRYHTHMIAS       -LATE PRESENTING MYOCARDIAL INFARCTION       -COPD   CLINICAL FOLLOW-UP RECOMMENDED.   Glucose, capillary     Status: None   Collection Time: 12/05/14 10:56 AM  Result Value Ref Range   Glucose-Capillary 85 65 - 99 mg/dL   Comment 1 Repeat Test   Glucose, capillary     Status: Abnormal   Collection Time: 12/05/14  4:54 PM  Result Value Ref Range   Glucose-Capillary 115 (H) 65 - 99 mg/dL   Comment 1 Document in Chart   Glucose, capillary     Status: Abnormal   Collection Time: 12/05/14  8:17 PM  Result Value Ref Range   Glucose-Capillary 114 (H) 65 - 99 mg/dL   Comment 1 Notify RN    Comment 2 Document in Chart   Glucose, capillary     Status: Abnormal   Collection Time: 12/06/14 12:24 AM  Result  Value Ref Range   Glucose-Capillary 112 (H) 65 - 99 mg/dL  Blood gas, arterial     Status: Abnormal   Collection Time: 12/06/14  3:06 AM  Result Value Ref Range   FIO2 0.35    Delivery systems VENTILATOR    Mode PRESSURE REGULATED VOLUME CONTROL    VT 500 mL   LHR 20.0 resp/min   Peep/cpap 5.0 cm H20   pH, Arterial 7.414 7.350 - 7.450   pCO2 arterial 21.1 (Mccullough) 35.0 - 45.0 mmHg   pO2, Arterial 182 (H) 80.0 - 100.0 mmHg   Bicarbonate 16.8 (Mccullough) 20.0 - 24.0 mEq/Mccullough   Acid-base deficit 10.5 (H) 0.0 - 2.0 mmol/Mccullough   O2 Saturation 99.5 %   Collection site LEFT RADIAL    Drawn by [id number]    Sample type ARTERIAL DRAW    Allens test (pass/fail) PASS PASS  Comprehensive metabolic panel     Status: Abnormal   Collection Time: 12/06/14  4:26 AM  Result Value Ref Range   Sodium 140 135 - 145 mmol/Mccullough   Potassium 4.1 3.5 - 5.1 mmol/Mccullough    Comment: DELTA CHECK NOTED   Chloride 118 (H) 101 - 111 mmol/Mccullough   CO2 16 (Mccullough) 22 - 32 mmol/Mccullough    Glucose, Bld 136 (H) 65 - 99 mg/dL   BUN 37 (H) 6 - 20 mg/dL   Creatinine, Ser 1.49 (H) 0.44 - 1.00 mg/dL   Calcium 6.6 (Mccullough) 8.9 - 10.3 mg/dL   Total Protein 4.9 (Mccullough) 6.5 - 8.1 g/dL   Albumin 2.3 (Mccullough) 3.5 - 5.0 g/dL   AST 44 (H) 15 - 41 U/Mccullough   ALT 22 14 - 54 U/Mccullough   Alkaline Phosphatase 114 38 - 126 U/Mccullough   Total Bilirubin 1.2 0.3 - 1.2 mg/dL   GFR calc non Af Amer 35 (Mccullough) >60 mL/min   GFR calc Af Amer 40 (Mccullough) >60 mL/min    Comment: (NOTE) The eGFR has been calculated using the CKD EPI equation. This calculation has not been validated in all clinical situations. eGFR's persistently <60 mL/min signify possible Chronic Kidney Disease.    Anion gap 6 5 - 15  CBC     Status: Abnormal   Collection Time: 12/06/14  4:26 AM  Result Value Ref Range   WBC 6.8 4.0 - 10.5 K/uL   RBC 2.92 (Mccullough) 3.87 - 5.11 MIL/uL   Hemoglobin 9.3 (Mccullough) 12.0 - 15.0 g/dL   HCT 26.7 (Mccullough) 36.0 - 46.0 %   MCV 91.4 78.0 - 100.0 fL   MCH 31.8 26.0 - 34.0 pg   MCHC 34.8 30.0 - 36.0 g/dL   RDW 22.7 (H) 11.5 - 15.5 %   Platelets 35 (Mccullough) 150 - 400 K/uL    Comment: SPECIMEN CHECKED FOR CLOTS PLATELET COUNT CONFIRMED BY SMEAR   Phosphorus     Status: Abnormal   Collection Time: 12/06/14  4:26 AM  Result Value Ref Range   Phosphorus <1.0 (LL) 2.5 - 4.6 mg/dL    Comment: CRITICAL RESULT CALLED TO, READ BACK BY AND VERIFIED WITH: HAMMOCK,S AT 7:00AM ON 12/06/14 BY FESTERMAN,C   Magnesium     Status: None   Collection Time: 12/06/14  4:26 AM  Result Value Ref Range   Magnesium 1.7 1.7 - 2.4 mg/dL  Glucose, capillary     Status: Abnormal   Collection Time: 12/06/14  5:35 AM  Result Value Ref Range   Glucose-Capillary 127 (H) 65 - 99 mg/dL  Glucose, capillary  Status: Abnormal   Collection Time: 12/06/14  7:54 AM  Result Value Ref Range   Glucose-Capillary 125 (H) 65 - 99 mg/dL   Comment 1 Document in Chart   TSH     Status: None   Collection Time: 12/06/14 10:02 AM  Result Value Ref Range   TSH 1.388 0.350 - 4.500  uIU/mL  Glucose, capillary     Status: Abnormal   Collection Time: 12/06/14 11:33 AM  Result Value Ref Range   Glucose-Capillary 196 (H) 65 - 99 mg/dL   Comment 1 Notify RN    Comment 2 Document in Chart   Glucose, capillary     Status: Abnormal   Collection Time: 12/06/14  4:22 PM  Result Value Ref Range   Glucose-Capillary 192 (H) 65 - 99 mg/dL   Comment 1 Notify RN    Comment 2 Document in Chart   Glucose, capillary     Status: Abnormal   Collection Time: 12/06/14  7:58 PM  Result Value Ref Range   Glucose-Capillary 173 (H) 65 - 99 mg/dL   Comment 1 Document in Chart   Glucose, capillary     Status: Abnormal   Collection Time: 12/07/14 12:16 AM  Result Value Ref Range   Glucose-Capillary 201 (H) 65 - 99 mg/dL   Comment 1 Notify RN   Blood gas, arterial     Status: Abnormal   Collection Time: 12/07/14  3:30 AM  Result Value Ref Range   FIO2 35.00    Delivery systems VENTILATOR    Mode PRESSURE REGULATED VOLUME CONTROL    VT 500 mL   LHR 20 resp/min   Peep/cpap 5.0 cm H20   pH, Arterial 7.368 7.350 - 7.450   pCO2 arterial 19.1 (LL) 35.0 - 45.0 mmHg    Comment: CRITICAL RESULT CALLED TO, READ BACK BY AND VERIFIED WITH:  JESSICA H. RN BY K KNICK RRT RCP ON 12/07/2014 AT 0335    pO2, Arterial 126 (H) 80.0 - 100.0 mmHg   Bicarbonate 14.4 (Mccullough) 20.0 - 24.0 mEq/Mccullough   TCO2 13.8 0 - 100 mmol/Mccullough   Acid-Base Excess 13.7 (H) 0.0 - 2.0 mmol/Mccullough   O2 Saturation 98.5 %   Patient temperature 37.0    Collection site LEFT RADIAL    Drawn by [id number]    Sample type ARTERIAL    Allens test (pass/fail) PASS PASS  Glucose, capillary     Status: Abnormal   Collection Time: 12/07/14  4:33 AM  Result Value Ref Range   Glucose-Capillary 156 (H) 65 - 99 mg/dL  Basic metabolic panel     Status: Abnormal   Collection Time: 12/07/14  5:00 AM  Result Value Ref Range   Sodium 135 135 - 145 mmol/Mccullough   Potassium 5.7 (H) 3.5 - 5.1 mmol/Mccullough    Comment: DELTA CHECK NOTED   Chloride 115 (H) 101 - 111 mmol/Mccullough    CO2 15 (Mccullough) 22 - 32 mmol/Mccullough   Glucose, Bld 161 (H) 65 - 99 mg/dL   BUN 31 (H) 6 - 20 mg/dL   Creatinine, Ser 1.31 (H) 0.44 - 1.00 mg/dL   Calcium 6.7 (Mccullough) 8.9 - 10.3 mg/dL   GFR calc non Af Amer 41 (Mccullough) >60 mL/min   GFR calc Af Amer 47 (Mccullough) >60 mL/min    Comment: (NOTE) The eGFR has been calculated using the CKD EPI equation. This calculation has not been validated in all clinical situations. eGFR's persistently <60 mL/min signify possible Chronic Kidney Disease.  Anion gap 5 5 - 15  CBC     Status: Abnormal   Collection Time: 12/07/14  5:00 AM  Result Value Ref Range   WBC 7.7 4.0 - 10.5 K/uL   RBC 3.17 (Mccullough) 3.87 - 5.11 MIL/uL   Hemoglobin 10.0 (Mccullough) 12.0 - 15.0 g/dL   HCT 29.7 (Mccullough) 36.0 - 46.0 %   MCV 93.7 78.0 - 100.0 fL   MCH 31.5 26.0 - 34.0 pg   MCHC 33.7 30.0 - 36.0 g/dL   RDW 23.3 (H) 11.5 - 15.5 %   Platelets 34 (Mccullough) 150 - 400 K/uL    Comment: SPECIMEN CHECKED FOR CLOTS CONSISTENT WITH PREVIOUS RESULT   Magnesium     Status: None   Collection Time: 12/07/14  5:00 AM  Result Value Ref Range   Magnesium 1.7 1.7 - 2.4 mg/dL  Phosphorus     Status: Abnormal   Collection Time: 12/07/14  5:00 AM  Result Value Ref Range   Phosphorus 1.7 (Mccullough) 2.5 - 4.6 mg/dL  Glucose, capillary     Status: Abnormal   Collection Time: 12/07/14  7:48 AM  Result Value Ref Range   Glucose-Capillary 150 (H) 65 - 99 mg/dL   Comment 1 Notify RN    Comment 2 Document in Chart     ABGS  Recent Labs  12/07/14 0330  PHART 7.368  PO2ART 126*  TCO2 13.8  HCO3 14.4*   CULTURES Recent Results (from the past 240 hour(s))  Urine culture     Status: None   Collection Time: 12/03/14 11:00 AM  Result Value Ref Range Status   Specimen Description URINE, CATHETERIZED  Final   Special Requests Normal  Final   Culture   Final    NO GROWTH 1 DAY Performed at [facility]    Report Status 12/04/2014 FINAL  Final  Culture, blood (routine x 2)     Status: None (Preliminary result)    Collection Time: 12/03/14 11:00 AM  Result Value Ref Range Status   Specimen Description BLOOD RIGHT ANTECUBITAL  Final   Special Requests BOTTLES DRAWN AEROBIC AND ANAEROBIC 6CC EACH  Final   Culture NO GROWTH 3 DAYS  Final   Report Status PENDING  Incomplete  Culture, blood (routine x 2)     Status: None (Preliminary result)   Collection Time: 12/03/14 11:20 AM  Result Value Ref Range Status   Specimen Description BLOOD RIGHT ANTECUBITAL  Final   Special Requests BOTTLES DRAWN AEROBIC AND ANAEROBIC 6CC EACH  Final   Culture NO GROWTH 3 DAYS  Final   Report Status PENDING  Incomplete  CSF culture     Status: None (Preliminary result)   Collection Time: 12/03/14  4:29 PM  Result Value Ref Range Status   Specimen Description BACK  Final   Special Requests Immunocompromised  Final   Gram Stain   Final    CYTOSPIN SLIDE NO WBC SEEN NO ORGANISMS SEEN CONFIRMED BY R.GREENE    Culture   Final    NO GROWTH 3 DAYS Performed at [facility]    Report Status PENDING  Incomplete  Gram stain     Status: None   Collection Time: 12/03/14  4:29 PM  Result Value Ref Range Status   Specimen Description BACK  Final   Special Requests Immunocompromised  Final   Gram Stain NO ORGANISMS SEEN NO WBC SEEN   Final   Report Status 12/03/2014 FINAL  Final  MRSA PCR Screening  Status: Abnormal   Collection Time: 12/04/14  2:31 AM  Result Value Ref Range Status   MRSA by PCR POSITIVE (A) NEGATIVE Final    Comment:        The GeneXpert MRSA Assay (FDA approved for NASAL specimens only), is one component of a comprehensive MRSA colonization surveillance program. It is not intended to diagnose MRSA infection nor to guide or monitor treatment for MRSA infections. RESULT CALLED TO, READ BACK BY AND VERIFIED WITH: Carron Curie ON [id number] AT 0610 BY RESSEGGER R    Studies/Results: No results found.  Medications:  Prior to Admission:  Prescriptions prior to admission  Medication  Sig Dispense Refill Last Dose  . aspirin EC 81 MG tablet Take 81 mg by mouth daily.   unknown  . bisacodyl (DULCOLAX) 5 MG EC tablet Take 5 mg by mouth daily as needed for moderate constipation.   unknown  . bumetanide (BUMEX) 1 MG tablet Take 1 mg by mouth daily.    unknown at Unknown time  . calcitRIOL (ROCALTROL) 0.25 MCG capsule Take 0.25 mcg by mouth every other day.   unknown  . cetirizine (ZYRTEC) 10 MG tablet Take 5 mg by mouth daily as needed for allergies.   unknown  . diphenhydramine-acetaminophen (TYLENOL PM) 25-500 MG TABS tablet Take 1 tablet by mouth at bedtime as needed (sleep).   unknown  . docusate sodium (COLACE) 100 MG capsule Take 100 mg by mouth 2 (two) times daily.   unknown  . esomeprazole (NEXIUM) 40 MG capsule Take 40 mg by mouth daily before breakfast.     unknown  . fluticasone (FLONASE) 50 MCG/ACT nasal spray Place 1 spray into both nostrils daily as needed for allergies.    unknown  . hydrocortisone cream 1 % Apply 1 application topically daily as needed for itching.   unknown  . LYRICA 75 MG capsule Take 75 mg by mouth 3 (three) times daily.   unknown at Unknown time  . magnesium oxide (MAG-OX) 400 MG tablet Take 400 mg by mouth daily.   unknown at Unknown time  . Melatonin 3 MG CAPS Take 3 mg by mouth at bedtime.   unknown  . metoprolol (TOPROL-XL) 200 MG 24 hr tablet Take 200 mg by mouth daily. Take along with 50 mg tablet to equal 200 mg.   unknown at Unknown time  . metoprolol succinate (TOPROL-XL) 100 MG 24 hr tablet Take 1 tablet (100 mg total) by mouth daily. Take with or immediately following a meal. 30 tablet 0 unknown  . metoprolol succinate (TOPROL-XL) 50 MG 24 hr tablet Take 50 mg by mouth daily. Take along with 200 mg tablet to equal 250 mg daily.   unknown  . montelukast (SINGULAIR) 10 MG tablet Take 10 mg by mouth at bedtime.     unknown at Unknown time  . Multiple Vitamins-Minerals (MULTIVITAMINS THER. W/MINERALS) TABS Take 1 tablet by mouth daily.      unknown  . oxyCODONE-acetaminophen (PERCOCET) 10-325 MG per tablet Take 1 tablet by mouth every 6 (six) hours as needed for pain. 20 tablet 0 unknown  . polyethylene glycol (MIRALAX / GLYCOLAX) packet Take 17 g by mouth daily as needed (constipation).    unknown  . PROAIR HFA 108 (90 BASE) MCG/ACT inhaler Take 2 puffs by mouth every 6 (six) hours as needed.   unknown  . sertraline (ZOLOFT) 50 MG tablet Take 100 mg by mouth at bedtime.   unknown at Unknown time  . clonazePAM (KLONOPIN) 0.5  MG tablet Take 1 tablet by mouth twice daily x 30 days. (use for Klonopin) (Patient not taking: Reported on 12/04/2014) 30 tablet 0 Not Taking at Unknown time  . sodium bicarbonate 650 MG tablet Take 1 tablet (650 mg total) by mouth 2 (two) times daily. (Patient not taking: Reported on 12/04/2014)   Not Taking at Unknown time   Scheduled: . sodium chloride   Intravenous Once  . antiseptic oral rinse  7 mL Mouth Rinse QID  . chlorhexidine gluconate  15 mL Mouth Rinse BID  . Chlorhexidine Gluconate Cloth  6 each Topical Q0600  . diltiazem  15 mg Intravenous Once  . insulin aspart  0-9 Units Subcutaneous 6 times per day  . levETIRAcetam  1,000 mg Intravenous Q12H  . mupirocin ointment  1 application Nasal BID  . pantoprazole (PROTONIX) IV  40 mg Intravenous Q12H  . sodium chloride  3 mL Intravenous Q12H  . sodium phosphate  Dextrose 5% IVPB  15 mmol Intravenous Once   Continuous: . Marland KitchenTPN (CLINIMIX-E) Adult 40 mL/hr at 12/06/14 1752  . dextrose 5 % and 0.45% NaCl 85 mL/hr (12/07/14 0751)  . diltiazem (CARDIZEM) infusion 7.5 mg/hr (12/07/14 0744)  . fentaNYL infusion INTRAVENOUS 50 mcg/hr (12/07/14 0752)   CMK:[id number], fentaNYL (SUBLIMAZE) injection, fentaNYL (SUBLIMAZE) injection, midazolam, midazolam  Assesment: She was admitted with altered mental status and dehydration and acute on chronic kidney disease and encephalopathy. She developed a Mallory-Weiss tear and had to have blood transfusion. She has  respiratory failure on a ventilator. Yesterday she appeared to be ready to come off of the ventilator when she developed atrial fibrillation with rapid ventricular response and this morning she is less awake and alert and responsive Principal Problem:   Altered mental status Active Problems:   Breast cancer metastasized to bone (HCC)   Dehydration   CKD (chronic kidney disease) stage 4, GFR 15-29 ml/min (HCC)   DM type 2 (diabetes mellitus, type 2) (HCC)   Mallory-Weiss tear   Encephalopathy acute   Respiratory failure ([place])   Seizure ([place])   Palliative care encounter   DNR (do not resuscitate) discussion    Plan: Reduce her sedation and see how she does. We can attempt weaning again if she is better as far as her mental status is concerned    LOS: 4 days   Catherine Mccullough 12/07/2014, 9:04 AM

## 2014-12-07 NOTE — Progress Notes (Signed)
Patient incont of large brownish stool. Patient became very restless during incontinent care with increase heartrate.

## 2014-12-07 NOTE — Progress Notes (Signed)
Patient restless . Sedation restarted.

## 2014-12-08 ENCOUNTER — Inpatient Hospital Stay (HOSPITAL_COMMUNITY): Payer: Medicare Other

## 2014-12-08 DIAGNOSIS — Z7189 Other specified counseling: Secondary | ICD-10-CM

## 2014-12-08 LAB — BLOOD GAS, ARTERIAL
ACID-BASE EXCESS: 10.5 mmol/L — AB (ref 0.0–2.0)
BICARBONATE: 16.6 meq/L — AB (ref 20.0–24.0)
Drawn by: 21310
FIO2: 35
LHR: 20 {breaths}/min
O2 SAT: 99.2 %
PCO2 ART: 19.4 mmHg — AB (ref 35.0–45.0)
PEEP: 5 cmH2O
PH ART: 7.441 (ref 7.350–7.450)
TCO2: 12.2 mmol/L (ref 0–100)
VT: 500 mL
pO2, Arterial: 139 mmHg — ABNORMAL HIGH (ref 80.0–100.0)

## 2014-12-08 LAB — CBC
HEMATOCRIT: 25.9 % — AB (ref 36.0–46.0)
Hemoglobin: 8.9 g/dL — ABNORMAL LOW (ref 12.0–15.0)
MCH: 32.2 pg (ref 26.0–34.0)
MCHC: 34.4 g/dL (ref 30.0–36.0)
MCV: 93.8 fL (ref 78.0–100.0)
Platelets: 24 10*3/uL — CL (ref 150–400)
RBC: 2.76 MIL/uL — AB (ref 3.87–5.11)
RDW: 23.1 % — ABNORMAL HIGH (ref 11.5–15.5)
WBC: 7.1 10*3/uL (ref 4.0–10.5)

## 2014-12-08 LAB — COMPREHENSIVE METABOLIC PANEL
ALT: 16 U/L (ref 14–54)
AST: 29 U/L (ref 15–41)
Albumin: 2 g/dL — ABNORMAL LOW (ref 3.5–5.0)
Alkaline Phosphatase: 105 U/L (ref 38–126)
Anion gap: 6 (ref 5–15)
BUN: 27 mg/dL — AB (ref 6–20)
CHLORIDE: 114 mmol/L — AB (ref 101–111)
CO2: 15 mmol/L — ABNORMAL LOW (ref 22–32)
Calcium: 7 mg/dL — ABNORMAL LOW (ref 8.9–10.3)
Creatinine, Ser: 1.13 mg/dL — ABNORMAL HIGH (ref 0.44–1.00)
GFR, EST AFRICAN AMERICAN: 57 mL/min — AB (ref >60)
GFR, EST NON AFRICAN AMERICAN: 49 mL/min — AB (ref >60)
Glucose, Bld: 131 mg/dL — ABNORMAL HIGH (ref 65–99)
POTASSIUM: 4.9 mmol/L (ref 3.5–5.1)
SODIUM: 135 mmol/L (ref 135–145)
Total Bilirubin: 1 mg/dL (ref 0.3–1.2)
Total Protein: 4.8 g/dL — ABNORMAL LOW (ref 6.5–8.1)

## 2014-12-08 LAB — GLUCOSE, CAPILLARY
GLUCOSE-CAPILLARY: 125 mg/dL — AB (ref 65–99)
GLUCOSE-CAPILLARY: 135 mg/dL — AB (ref 65–99)
Glucose-Capillary: 136 mg/dL — ABNORMAL HIGH (ref 65–99)
Glucose-Capillary: 80 mg/dL (ref 65–99)
Glucose-Capillary: 87 mg/dL (ref 65–99)

## 2014-12-08 LAB — PHOSPHORUS: PHOSPHORUS: 2.1 mg/dL — AB (ref 2.5–4.6)

## 2014-12-08 LAB — MAGNESIUM: Magnesium: 1.5 mg/dL — ABNORMAL LOW (ref 1.7–2.4)

## 2014-12-08 MED ORDER — FENTANYL CITRATE (PF) 2500 MCG/50ML IJ SOLN
INTRAMUSCULAR | Status: AC
  Filled 2014-12-08: qty 50

## 2014-12-08 MED ORDER — ACETAMINOPHEN 10 MG/ML IV SOLN
1000.0000 mg | Freq: Once | INTRAVENOUS | Status: AC
  Administered 2014-12-08: 1000 mg via INTRAVENOUS
  Filled 2014-12-08: qty 100

## 2014-12-08 MED ORDER — LACOSAMIDE 200 MG/20ML IV SOLN
INTRAVENOUS | Status: AC
  Filled 2014-12-08: qty 20

## 2014-12-08 MED ORDER — FUROSEMIDE 10 MG/ML IJ SOLN: 10.0000 mg | Freq: Once | INTRAMUSCULAR | Status: DC

## 2014-12-08 NOTE — Progress Notes (Signed)
[facility] PROGRESS NOTE  Catherine Mccullough [id number] DOB: 17-Jan-1945 DOA: 12/03/2014 PCP: Default, Provider, MD  Assessment/Plan: Acute encephalopathy -Patient was brought into the hospital after being down for what is thought to be 3 days. -Neurology is on board. -We seem to believe currently that etiology is her seizures. -Patient had a lumbar puncture that was not indicative for infection although she did have a really high total protein. -Not very responsive this am but is on a fentanyl drip.  Ventilatory dependent acute hypoxic and hypercarbic respiratory failure -Appreciate Dr. Luan Pulling input and recommendations. -Will attempt weaning tomorrow per family wishes. If unable to wean, they wish to pursue comfort measures.  Hypothermia -Temperature on admission was 90. -She is currently normothermic.  Hypo-tension -Blood pressure is currently stable without pressors.  Hypokalemia -Resolved.  Nutrition -Will DC TPN today. -If able to extubate, obtain ST consult for swallow evaluation.  Acute on Chronic kidney disease stage III -Baseline creatinine is around 1.4-1.7. -Was 2.5 on admission, is currently better than her baseline at 1.13 on 12/2.  Atrial fibrillation with rapid ventricular response -Rate controlled on cardizem drip. Can consider transitioning to PO once extubated. -2-D echo with markedly reduced EF of 25% with multiple wall motion abnormalities. This is a change from her prior ECHO in August. Discussed these findings with son, Catherine Mccullough. If ever able to successfully extubate will need to consider cardiology evaluation for ischemic work up.  Seizure activity -Continue  vimpat as recommended by neurology.  -Consider MRI once extubated (MRI at [facility] unable to accommodate ventilator apparatus).  Upper GI bleed -Due to Mallory-Weiss tear status post emergent bedside EGD on 11/27 with placement of 2 clips. -No further evidence of continued GI  bleed. -Hemoglobin stable between 9-10, received a total of 2 units of PRBCs this admission. -Appreciate GI input and recommendations.  ? Presumed sepsis -No evidence for active infection: Chest x-ray negative, urine, blood, CSF cultures negative to date, negative urinalysis. -I believe her presentation with hypothermia, hypotension were more due to seizure activity and prolonged fallen state than to infection. -She remains afebrile and without leukocytosis. -Off all antibiotics presently.      Code Status: DNR as of 12/1. Family Communication: Aunt at bedside Disposition Plan: Keep in ICU, remains mechanically ventilated. Patient is critically ill and requires high level of care. Plan for extubation in am. Per family wishes, if unable to sustain life on her own will pursue a comfort care pathway.  Consultants:  Neurology  Pulmonology  Gastroenterology   Antibiotics:  None   Subjective: Still remains quite unresponsive, altho on fentanyl for sedation.  Objective: Filed Vitals:   12/08/14 0200 12/08/14 0300 12/08/14 0400 12/08/14 0500  BP: 106/55 116/57 114/72 134/52  Pulse: 98 98 96 98  Temp: 99.9 F (37.7 C) 99.7 F (37.6 C) 99.7 F (37.6 C) 99.7 F (37.6 C)  TempSrc:      Resp: 20 14 18 20   Height:      Weight:    68.6 kg (151 lb 3.8 oz)  SpO2: 100% 100% 100% 100%    Intake/Output Summary (Last 24 hours) at 12/08/14 0857 Last data filed at 12/08/14 0500  Gross per 24 hour  Intake 2083.26 ml  Output   2000 ml  Net  83.26 ml   Filed Weights   12/06/14 0500 12/07/14 0500 12/08/14 0500  Weight: 60.5 kg (133 lb 6.1 oz) 65.3 kg (143 lb 15.4 oz) 68.6 kg (151 lb 3.8 oz)  Exam:   General:  Unable to follow commands, remains intubated  Cardiovascular: tachy, no murmurs, irregular  Respiratory: Coarse bilateral breath sounds with rhonchi and rails  Abdomen: Soft, nontender, nondistended, positive bowel sounds  Extremities: Trace bilateral edema  bilaterally   Neurologic:  Unable to fully assess given current mental state  Data Reviewed: Basic Metabolic Panel:  Recent Labs Lab 12/04/14 0450  12/05/14 0430 12/05/14 1034 12/06/14 0426 12/07/14 0500 12/08/14 0433  NA  --   < > 146* 147* 140 135 135  K  --   < > 2.8* 3.1* 4.1 5.7* 4.9  CL  --   < > 119* 119* 118* 115* 114*  CO2  --   < > 17* 15* 16* 15* 15*  GLUCOSE  --   < > 129* 101* 136* 161* 131*  BUN  --   < > 47* 46* 37* 31* 27*  CREATININE  --   < > 1.81* 1.77* 1.49* 1.31* 1.13*  CALCIUM  --   < > 6.4* 6.5* 6.6* 6.7* 7.0*  MG 1.9  --  1.3*  --  1.7 1.7 1.5*  PHOS  --   --  1.0*  --  <1.0* 1.7* 2.1*  < > = values in this interval not displayed. Liver Function Tests:  Recent Labs Lab 12/03/14 1100 12/04/14 0115 12/05/14 0430 12/06/14 0426 12/08/14 0433  AST 40 45* 61* 44* 29  ALT 20 18 22 22 16   ALKPHOS 107 85 98 114 105  BILITOT 0.8 0.8 1.3* 1.2 1.0  PROT 8.3* 5.3* 4.8* 4.9* 4.8*  ALBUMIN 4.5 2.7* 2.3* 2.3* 2.0*   No results for input(s): LIPASE, AMYLASE in the last 168 hours.  Recent Labs Lab 12/03/14 1100  AMMONIA 40*   CBC:  Recent Labs Lab 12/03/14 1100 12/04/14 0115 12/04/14 1025 12/05/14 0430 12/06/14 0426 12/07/14 0500 12/08/14 0433  WBC 18.6* 2.1* 2.7* 5.5 6.8 7.7 7.1  NEUTROABS 17.0* 1.9  --  4.8  --   --   --   HGB 13.7 8.3* 8.5* 10.2* 9.3* 10.0* 8.9*  HCT 40.5 23.7* 24.1* 29.1* 26.7* 29.7* 25.9*  MCV 104.1* 100.4* 98.8 90.7 91.4 93.7 93.8  PLT 205 102* 86* 47* 35* 34* 24*   Cardiac Enzymes:  Recent Labs Lab 12/03/14 1200 12/05/14 1034  CKTOTAL 356*  --   TROPONINI  --  0.28*   BNP (last 3 results)  Recent Labs  03/04/14 0726 08/21/14 0029  BNP 1040.0* 1557.0*    ProBNP (last 3 results) No results for input(s): PROBNP in the last 8760 hours.  CBG:  Recent Labs Lab 12/07/14 1646 12/07/14 1950 12/07/14 2350 12/08/14 0422 12/08/14 0728  GLUCAP 105* 98 117* 136* 125*    Recent Results (from the past  240 hour(s))  Urine culture     Status: None   Collection Time: 12/03/14 11:00 AM  Result Value Ref Range Status   Specimen Description URINE, CATHETERIZED  Final   Special Requests Normal  Final   Culture   Final    NO GROWTH 1 DAY Performed at [facility]    Report Status 12/04/2014 FINAL  Final  Culture, blood (routine x 2)     Status: None (Preliminary result)   Collection Time: 12/03/14 11:00 AM  Result Value Ref Range Status   Specimen Description BLOOD RIGHT ANTECUBITAL  Final   Special Requests BOTTLES DRAWN AEROBIC AND ANAEROBIC 6CC EACH  Final   Culture NO GROWTH 4 DAYS  Final  Report Status PENDING  Incomplete  Culture, blood (routine x 2)     Status: None (Preliminary result)   Collection Time: 12/03/14 11:20 AM  Result Value Ref Range Status   Specimen Description BLOOD RIGHT ANTECUBITAL  Final   Special Requests BOTTLES DRAWN AEROBIC AND ANAEROBIC [place]  Final   Culture NO GROWTH 4 DAYS  Final   Report Status PENDING  Incomplete  CSF culture     Status: None   Collection Time: 12/03/14  4:29 PM  Result Value Ref Range Status   Specimen Description BACK  Final   Special Requests Immunocompromised  Final   Gram Stain   Final    CYTOSPIN SLIDE NO WBC SEEN NO ORGANISMS SEEN CONFIRMED BY R.GREENE    Culture   Final    NO GROWTH 3 DAYS Performed at [facility]    Report Status 12/07/2014 FINAL  Final  Gram stain     Status: None   Collection Time: 12/03/14  4:29 PM  Result Value Ref Range Status   Specimen Description BACK  Final   Special Requests Immunocompromised  Final   Gram Stain NO ORGANISMS SEEN NO WBC SEEN   Final   Report Status 12/03/2014 FINAL  Final  MRSA PCR Screening     Status: Abnormal   Collection Time: 12/04/14  2:31 AM  Result Value Ref Range Status   MRSA by PCR POSITIVE (A) NEGATIVE Final    Comment:        The GeneXpert MRSA Assay (FDA approved for NASAL specimens only), is one component of a comprehensive  MRSA colonization surveillance program. It is not intended to diagnose MRSA infection nor to guide or monitor treatment for MRSA infections. RESULT CALLED TO, READ BACK BY AND VERIFIED WITH: Carron Curie ON [id number] AT 0610 BY RESSEGGER R      Studies: No results found.  Scheduled Meds: . sodium chloride   Intravenous Once  . antiseptic oral rinse  7 mL Mouth Rinse QID  . chlorhexidine gluconate  15 mL Mouth Rinse BID  . Chlorhexidine Gluconate Cloth  6 each Topical Q0600  . diltiazem  15 mg Intravenous Once  . insulin aspart  0-9 Units Subcutaneous 6 times per day  . lacosamide (VIMPAT) IV  50 mg Intravenous Q12H  . mupirocin ointment  1 application Nasal BID  . pantoprazole (PROTONIX) IV  40 mg Intravenous Q12H  . sodium chloride  3 mL Intravenous Q12H   Continuous Infusions: . Marland KitchenTPN (CLINIMIX-E) Adult 40 mL/hr at 12/07/14 1759  . dextrose 5 % and 0.45% NaCl 85 mL/hr at 12/08/14 0848  . diltiazem (CARDIZEM) infusion 7.5 mg/hr (12/08/14 0515)  . fentaNYL infusion INTRAVENOUS 50 mcg/hr (12/07/14 1758)    Principal Problem:   Altered mental status Active Problems:   Breast cancer metastasized to bone (HCC)   Dehydration   CKD (chronic kidney disease) stage 4, GFR 15-29 ml/min (HCC)   DM type 2 (diabetes mellitus, type 2) (HCC)   Mallory-Weiss tear   Encephalopathy acute   Respiratory failure ([place])   Seizure ([place])   Palliative care encounter   DNR (do not resuscitate) discussion    Critical care time spent: 35 minutes. Greater than 50% of this time was spent in direct contact with the patient coordinating care.    Lelon Frohlich  [facility] Pager [phone]  If 7PM-7AM, please contact night-coverage at [email or web address], password [email or web address] 12/08/2014, 8:57 AM  LOS: 5 days

## 2014-12-08 NOTE — Progress Notes (Signed)
Subjective: She is overall about the same. She remains on fentanyl. Her seizure medications have been changed. No overt seizures but she has some that appeared to be atypical. She remains intubated and on the ventilator.  Objective: Vital signs in last 24 hours: Temp:  [99.5 F (37.5 C)-101.3 F (38.5 C)] 99.7 F (37.6 C) (12/02 0500) Pulse Rate:  [96-120] 98 (12/02 0500) Resp:  [14-33] 20 (12/02 0500) BP: (92-144)/(48-77) 134/52 mmHg (12/02 0500) SpO2:  [100 %] 100 % (12/02 0500) FiO2 (%):  [35 %] 35 % (12/02 0434) Weight:  [68.6 kg (151 lb 3.8 oz)] 68.6 kg (151 lb 3.8 oz) (12/02 0500) Weight change: 3.3 kg (7 lb 4.4 oz) Last BM Date: 12/07/14  Intake/Output from previous day: 12/01 0701 - 12/02 0700 In: 2333.3 [I.V.:1238.6; IV Piggyback:130; TPN:964.7] Out: 2000 [Urine:2000]  PHYSICAL EXAM General appearance: Sedated intubated on mechanical ventilation Resp: rhonchi bilaterally Cardio: regular rate and rhythm, S1, S2 normal, no murmur, click, rub or gallop GI: soft, non-tender; bowel sounds normal; no masses,  no organomegaly Extremities: extremities normal, atraumatic, no cyanosis or edema  Lab Results:  Results for orders placed or performed during the hospital encounter of 12/03/14 (from the past 48 hour(s))  Glucose, capillary     Status: Abnormal   Collection Time: 12/06/14  7:54 AM  Result Value Ref Range   Glucose-Capillary 125 (H) 65 - 99 mg/dL   Comment 1 Document in Chart   TSH     Status: None   Collection Time: 12/06/14 10:02 AM  Result Value Ref Range   TSH 1.388 0.350 - 4.500 uIU/mL  Glucose, capillary     Status: Abnormal   Collection Time: 12/06/14 11:33 AM  Result Value Ref Range   Glucose-Capillary 196 (H) 65 - 99 mg/dL   Comment 1 Notify RN    Comment 2 Document in Chart   Glucose, capillary     Status: Abnormal   Collection Time: 12/06/14  4:22 PM  Result Value Ref Range   Glucose-Capillary 192 (H) 65 - 99 mg/dL   Comment 1 Notify RN    Comment 2 Document in Chart   Glucose, capillary     Status: Abnormal   Collection Time: 12/06/14  7:58 PM  Result Value Ref Range   Glucose-Capillary 173 (H) 65 - 99 mg/dL   Comment 1 Document in Chart   Glucose, capillary     Status: Abnormal   Collection Time: 12/07/14 12:16 AM  Result Value Ref Range   Glucose-Capillary 201 (H) 65 - 99 mg/dL   Comment 1 Notify RN   Blood gas, arterial     Status: Abnormal   Collection Time: 12/07/14  3:30 AM  Result Value Ref Range   FIO2 35.00    Delivery systems VENTILATOR    Mode PRESSURE REGULATED VOLUME CONTROL    VT 500 mL   LHR 20 resp/min   Peep/cpap 5.0 cm H20   pH, Arterial 7.368 7.350 - 7.450   pCO2 arterial 19.1 (LL) 35.0 - 45.0 mmHg    Comment: CRITICAL RESULT CALLED TO, READ BACK BY AND VERIFIED WITH:  JESSICA H. RN BY K KNICK RRT RCP ON 12/07/2014 AT 0335    pO2, Arterial 126 (H) 80.0 - 100.0 mmHg   Bicarbonate 14.4 (L) 20.0 - 24.0 mEq/L   TCO2 13.8 0 - 100 mmol/L   Acid-Base Excess 13.7 (H) 0.0 - 2.0 mmol/L   O2 Saturation 98.5 %   Patient temperature 37.0    Collection site  LEFT RADIAL    Drawn by [id number]    Sample type ARTERIAL    Allens test (pass/fail) PASS PASS  Glucose, capillary     Status: Abnormal   Collection Time: 12/07/14  4:33 AM  Result Value Ref Range   Glucose-Capillary 156 (H) 65 - 99 mg/dL  Basic metabolic panel     Status: Abnormal   Collection Time: 12/07/14  5:00 AM  Result Value Ref Range   Sodium 135 135 - 145 mmol/L   Potassium 5.7 (H) 3.5 - 5.1 mmol/L    Comment: DELTA CHECK NOTED   Chloride 115 (H) 101 - 111 mmol/L   CO2 15 (L) 22 - 32 mmol/L   Glucose, Bld 161 (H) 65 - 99 mg/dL   BUN 31 (H) 6 - 20 mg/dL   Creatinine, Ser 1.31 (H) 0.44 - 1.00 mg/dL   Calcium 6.7 (L) 8.9 - 10.3 mg/dL   GFR calc non Af Amer 41 (L) >60 mL/min   GFR calc Af Amer 47 (L) >60 mL/min    Comment: (NOTE) The eGFR has been calculated using the CKD EPI equation. This calculation has not been validated in all  clinical situations. eGFR's persistently <60 mL/min signify possible Chronic Kidney Disease.    Anion gap 5 5 - 15  CBC     Status: Abnormal   Collection Time: 12/07/14  5:00 AM  Result Value Ref Range   WBC 7.7 4.0 - 10.5 K/uL   RBC 3.17 (L) 3.87 - 5.11 MIL/uL   Hemoglobin 10.0 (L) 12.0 - 15.0 g/dL   HCT 29.7 (L) 36.0 - 46.0 %   MCV 93.7 78.0 - 100.0 fL   MCH 31.5 26.0 - 34.0 pg   MCHC 33.7 30.0 - 36.0 g/dL   RDW 23.3 (H) 11.5 - 15.5 %   Platelets 34 (L) 150 - 400 K/uL    Comment: SPECIMEN CHECKED FOR CLOTS CONSISTENT WITH PREVIOUS RESULT   Magnesium     Status: None   Collection Time: 12/07/14  5:00 AM  Result Value Ref Range   Magnesium 1.7 1.7 - 2.4 mg/dL  Phosphorus     Status: Abnormal   Collection Time: 12/07/14  5:00 AM  Result Value Ref Range   Phosphorus 1.7 (L) 2.5 - 4.6 mg/dL  Glucose, capillary     Status: Abnormal   Collection Time: 12/07/14  7:48 AM  Result Value Ref Range   Glucose-Capillary 150 (H) 65 - 99 mg/dL   Comment 1 Notify RN    Comment 2 Document in Chart   Glucose, capillary     Status: Abnormal   Collection Time: 12/07/14 11:52 AM  Result Value Ref Range   Glucose-Capillary 116 (H) 65 - 99 mg/dL   Comment 1 Notify RN    Comment 2 Document in Chart   Glucose, capillary     Status: Abnormal   Collection Time: 12/07/14  4:46 PM  Result Value Ref Range   Glucose-Capillary 105 (H) 65 - 99 mg/dL   Comment 1 Notify RN    Comment 2 Document in Chart   Glucose, capillary     Status: None   Collection Time: 12/07/14  7:50 PM  Result Value Ref Range   Glucose-Capillary 98 65 - 99 mg/dL   Comment 1 Notify RN    Comment 2 Document in Chart   Glucose, capillary     Status: Abnormal   Collection Time: 12/07/14 11:50 PM  Result Value Ref Range   Glucose-Capillary 117 (H) 65 -  99 mg/dL   Comment 1 Notify RN   Glucose, capillary     Status: Abnormal   Collection Time: 12/08/14  4:22 AM  Result Value Ref Range   Glucose-Capillary 136 (H) 65 - 99  mg/dL   Comment 1 Notify RN   Magnesium     Status: Abnormal   Collection Time: 12/08/14  4:33 AM  Result Value Ref Range   Magnesium 1.5 (L) 1.7 - 2.4 mg/dL  Phosphorus     Status: Abnormal   Collection Time: 12/08/14  4:33 AM  Result Value Ref Range   Phosphorus 2.1 (L) 2.5 - 4.6 mg/dL  Comprehensive metabolic panel     Status: Abnormal   Collection Time: 12/08/14  4:33 AM  Result Value Ref Range   Sodium 135 135 - 145 mmol/L   Potassium 4.9 3.5 - 5.1 mmol/L   Chloride 114 (H) 101 - 111 mmol/L   CO2 15 (L) 22 - 32 mmol/L   Glucose, Bld 131 (H) 65 - 99 mg/dL   BUN 27 (H) 6 - 20 mg/dL   Creatinine, Ser 1.13 (H) 0.44 - 1.00 mg/dL   Calcium 7.0 (L) 8.9 - 10.3 mg/dL   Total Protein 4.8 (L) 6.5 - 8.1 g/dL   Albumin 2.0 (L) 3.5 - 5.0 g/dL   AST 29 15 - 41 U/L   ALT 16 14 - 54 U/L   Alkaline Phosphatase 105 38 - 126 U/L   Total Bilirubin 1.0 0.3 - 1.2 mg/dL   GFR calc non Af Amer 49 (L) >60 mL/min   GFR calc Af Amer 57 (L) >60 mL/min    Comment: (NOTE) The eGFR has been calculated using the CKD EPI equation. This calculation has not been validated in all clinical situations. eGFR's persistently <60 mL/min signify possible Chronic Kidney Disease.    Anion gap 6 5 - 15  CBC     Status: Abnormal   Collection Time: 12/08/14  4:33 AM  Result Value Ref Range   WBC 7.1 4.0 - 10.5 K/uL   RBC 2.76 (L) 3.87 - 5.11 MIL/uL   Hemoglobin 8.9 (L) 12.0 - 15.0 g/dL   HCT 25.9 (L) 36.0 - 46.0 %   MCV 93.8 78.0 - 100.0 fL   MCH 32.2 26.0 - 34.0 pg   MCHC 34.4 30.0 - 36.0 g/dL   RDW 23.1 (H) 11.5 - 15.5 %   Platelets 24 (LL) 150 - 400 K/uL    Comment: RESULT REPEATED AND VERIFIED PLATELET COUNT CONFIRMED BY SMEAR CRITICAL RESULT CALLED TO, READ BACK BY AND VERIFIED WITH: JESSICA HEARN RN ON [id number] AT 0600 BY RESEGGER R   Blood gas, arterial     Status: Abnormal   Collection Time: 12/08/14  4:45 AM  Result Value Ref Range   FIO2 35.00    Delivery systems VENTILATOR    Mode PRESSURE  REGULATED VOLUME CONTROL    VT 500 mL   LHR 20.0 resp/min   Peep/cpap 5.0 cm H20   pH, Arterial 7.441 7.350 - 7.450   pCO2 arterial 19.4 (LL) 35.0 - 45.0 mmHg    Comment: CRITICAL RESULT CALLED TO, READ BACK BY AND VERIFIED WITH: HEARN,J.RN AT 0505 12/08/14 BY ANDERSON,S RRT/RCP    pO2, Arterial 139.0 (H) 80.0 - 100.0 mmHg   Bicarbonate 16.6 (L) 20.0 - 24.0 mEq/L   TCO2 12.2 0 - 100 mmol/L   Acid-Base Excess 10.5 (H) 0.0 - 2.0 mmol/L   O2 Saturation 99.2 %   Collection site RIGHT RADIAL  Drawn by [id number]    Sample type ARTERIAL    Allens test (pass/fail) PASS PASS    ABGS  Recent Labs  12/08/14 0445  PHART 7.441  PO2ART 139.0*  TCO2 12.2  HCO3 16.6*   CULTURES Recent Results (from the past 240 hour(s))  Urine culture     Status: None   Collection Time: 12/03/14 11:00 AM  Result Value Ref Range Status   Specimen Description URINE, CATHETERIZED  Final   Special Requests Normal  Final   Culture   Final    NO GROWTH 1 DAY Performed at [facility]    Report Status 12/04/2014 FINAL  Final  Culture, blood (routine x 2)     Status: None (Preliminary result)   Collection Time: 12/03/14 11:00 AM  Result Value Ref Range Status   Specimen Description BLOOD RIGHT ANTECUBITAL  Final   Special Requests BOTTLES DRAWN AEROBIC AND ANAEROBIC [place]  Final   Culture NO GROWTH 4 DAYS  Final   Report Status PENDING  Incomplete  Culture, blood (routine x 2)     Status: None (Preliminary result)   Collection Time: 12/03/14 11:20 AM  Result Value Ref Range Status   Specimen Description BLOOD RIGHT ANTECUBITAL  Final   Special Requests BOTTLES DRAWN AEROBIC AND ANAEROBIC [place]  Final   Culture NO GROWTH 4 DAYS  Final   Report Status PENDING  Incomplete  CSF culture     Status: None   Collection Time: 12/03/14  4:29 PM  Result Value Ref Range Status   Specimen Description BACK  Final   Special Requests Immunocompromised  Final   Gram Stain   Final    CYTOSPIN SLIDE NO  WBC SEEN NO ORGANISMS SEEN CONFIRMED BY R.GREENE    Culture   Final    NO GROWTH 3 DAYS Performed at [facility]    Report Status 12/07/2014 FINAL  Final  Gram stain     Status: None   Collection Time: 12/03/14  4:29 PM  Result Value Ref Range Status   Specimen Description BACK  Final   Special Requests Immunocompromised  Final   Gram Stain NO ORGANISMS SEEN NO WBC SEEN   Final   Report Status 12/03/2014 FINAL  Final  MRSA PCR Screening     Status: Abnormal   Collection Time: 12/04/14  2:31 AM  Result Value Ref Range Status   MRSA by PCR POSITIVE (A) NEGATIVE Final    Comment:        The GeneXpert MRSA Assay (FDA approved for NASAL specimens only), is one component of a comprehensive MRSA colonization surveillance program. It is not intended to diagnose MRSA infection nor to guide or monitor treatment for MRSA infections. RESULT CALLED TO, READ BACK BY AND VERIFIED WITH: Carron Curie ON [id number] AT 0610 BY RESSEGGER R    Studies/Results: No results found.  Medications:  Prior to Admission:  Prescriptions prior to admission  Medication Sig Dispense Refill Last Dose  . aspirin EC 81 MG tablet Take 81 mg by mouth daily.   unknown  . bisacodyl (DULCOLAX) 5 MG EC tablet Take 5 mg by mouth daily as needed for moderate constipation.   unknown  . bumetanide (BUMEX) 1 MG tablet Take 1 mg by mouth daily.    unknown at Unknown time  . calcitRIOL (ROCALTROL) 0.25 MCG capsule Take 0.25 mcg by mouth every other day.   unknown  . cetirizine (ZYRTEC) 10 MG tablet Take 5 mg by mouth  daily as needed for allergies.   unknown  . diphenhydramine-acetaminophen (TYLENOL PM) 25-500 MG TABS tablet Take 1 tablet by mouth at bedtime as needed (sleep).   unknown  . docusate sodium (COLACE) 100 MG capsule Take 100 mg by mouth 2 (two) times daily.   unknown  . esomeprazole (NEXIUM) 40 MG capsule Take 40 mg by mouth daily before breakfast.     unknown  . fluticasone (FLONASE) 50 MCG/ACT  nasal spray Place 1 spray into both nostrils daily as needed for allergies.    unknown  . hydrocortisone cream 1 % Apply 1 application topically daily as needed for itching.   unknown  . LYRICA 75 MG capsule Take 75 mg by mouth 3 (three) times daily.   unknown at Unknown time  . magnesium oxide (MAG-OX) 400 MG tablet Take 400 mg by mouth daily.   unknown at Unknown time  . Melatonin 3 MG CAPS Take 3 mg by mouth at bedtime.   unknown  . metoprolol (TOPROL-XL) 200 MG 24 hr tablet Take 200 mg by mouth daily. Take along with 50 mg tablet to equal 200 mg.   unknown at Unknown time  . metoprolol succinate (TOPROL-XL) 100 MG 24 hr tablet Take 1 tablet (100 mg total) by mouth daily. Take with or immediately following a meal. 30 tablet 0 unknown  . metoprolol succinate (TOPROL-XL) 50 MG 24 hr tablet Take 50 mg by mouth daily. Take along with 200 mg tablet to equal 250 mg daily.   unknown  . montelukast (SINGULAIR) 10 MG tablet Take 10 mg by mouth at bedtime.     unknown at Unknown time  . Multiple Vitamins-Minerals (MULTIVITAMINS THER. W/MINERALS) TABS Take 1 tablet by mouth daily.     unknown  . oxyCODONE-acetaminophen (PERCOCET) 10-325 MG per tablet Take 1 tablet by mouth every 6 (six) hours as needed for pain. 20 tablet 0 unknown  . polyethylene glycol (MIRALAX / GLYCOLAX) packet Take 17 g by mouth daily as needed (constipation).    unknown  . PROAIR HFA 108 (90 BASE) MCG/ACT inhaler Take 2 puffs by mouth every 6 (six) hours as needed.   unknown  . sertraline (ZOLOFT) 50 MG tablet Take 100 mg by mouth at bedtime.   unknown at Unknown time  . clonazePAM (KLONOPIN) 0.5 MG tablet Take 1 tablet by mouth twice daily x 30 days. (use for Klonopin) (Patient not taking: Reported on 12/04/2014) 30 tablet 0 Not Taking at Unknown time  . sodium bicarbonate 650 MG tablet Take 1 tablet (650 mg total) by mouth 2 (two) times daily. (Patient not taking: Reported on 12/04/2014)   Not Taking at Unknown time   Scheduled: .  sodium chloride   Intravenous Once  . antiseptic oral rinse  7 mL Mouth Rinse QID  . chlorhexidine gluconate  15 mL Mouth Rinse BID  . Chlorhexidine Gluconate Cloth  6 each Topical Q0600  . diltiazem  15 mg Intravenous Once  . insulin aspart  0-9 Units Subcutaneous 6 times per day  . lacosamide (VIMPAT) IV  50 mg Intravenous Q12H  . mupirocin ointment  1 application Nasal BID  . pantoprazole (PROTONIX) IV  40 mg Intravenous Q12H  . sodium chloride  3 mL Intravenous Q12H   Continuous: . Marland KitchenTPN (CLINIMIX-E) Adult 40 mL/hr at 12/07/14 1759  . dextrose 5 % and 0.45% NaCl 85 mL/hr at 12/07/14 2100  . diltiazem (CARDIZEM) infusion 7.5 mg/hr (12/08/14 0515)  . fentaNYL infusion INTRAVENOUS 50 mcg/hr (12/07/14 1758)  XFF:[id number], fentaNYL, fentaNYL (SUBLIMAZE) injection, fentaNYL (SUBLIMAZE) injection, midazolam, midazolam  Assesment: She was admitted with altered mental status and it was thought that she had been down for several days. When she came in she was intensely acidemic she required intubation and mechanical ventilation but she had improved. She was also having GI bleeding with a Mallory-Weiss tear. It is thought that the problem is related to seizures. At baseline she has metastatic breast cancer. About 48 hours ago it looked like she might be able to come off the ventilator but she developed atrial fibrillation with rapid ventricular response and since then she's had more trouble with mental status. This is felt to be secondary to seizures in her medications and been adjusted. Her family has undertaken palliative care consultation and discussion and plans are for her to have extubation tomorrow. I think we should attempt standard extubation rather than terminal weaning if she looks like she is close to coming off the ventilator. If she is still having significant trouble then I would plan terminal weaning. 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 As above.    LOS: 5 days   Vendetta Pittinger L 12/08/2014, 7:05 AM

## 2014-12-08 NOTE — Progress Notes (Signed)
Patient ID: Catherine Mccullough, female   DOB: [birth year], 69 y.o.   MRN: [id number]  [place] A. [name], MD     [email or web address]          Catherine Mccullough is an 69 y.o. female.   Assessment/Plan: Acute severe encephalopathy of unclear etiology but associated with very high CSF protein, seizures and hypothermia. CSF analysis and other testing does not support diagnosis of infection at this time. The differential diagnosis for very high CSF protein includes bacterial related meningitis, TB meningitis, brain tumors, intracranial trauma and intracranial hemorrhage. Given the seizures, I think this is the most likely etiology however. 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  Thrombocytopenia -  Multifactorial but Keppra very rarely can cause thrombocytopenia.  Atrial fibrillation.     RECOMMENDATION: Repeat CBC.  Increase the dose of Vimpat. Imaging of the brain with IV contrast once the patient is stable. MRI is preferable. Avoid sedatives.       The patient remains intubated. She was even some fentanyl. No clinical seizures reported. She appears quite edematous suggestive of anasarca/congestive heart failure. She also has a significant drop in her ejection fraction with the EF of 25 previously 65 just 2-3 months ago.  HEENT: Supple. Atraumatic normocephalic.   ABDOMEN: soft  EXTREMITIES: No edema   BACK: Normal.  SKIN: Normal by inspection.   MENTAL STATUS: She is less responsive today but eyes are open. She focuses and tracks but does not follow commands. She is quite edematous globally suggestive of anasarca.  CRANIAL NERVES: The pupils are deviated to the left and downward;  upper and lower facial muscles are normal in strength and symmetric,  there is no flattening of the nasolabial folds. Cough and gag reflexes are intact. She has full extraocular movements. No nystagmus is appreciated.  MOTOR: Bulk and tone are normal throughout.  She has antigravity strength of the upper extremities.  COORDINATION: No tremors or dysmetria is appreciated.  REFLEXES: Deep tendon reflexes are symmetrical and normal. Babinski reflexes are flexor on the right but upgoing on the left  SENSATION: She responds to deep painful stimuli bilaterally   EEG shows moderate to severe global slowing. No epileptiform activities.   Objective: Vital signs in last 24 hours: Temp:  [99.3 F (37.4 C)-101.1 F (38.4 C)] 100.8 F (38.2 C) (12/02 1652) Pulse Rate:  [95-118] 106 (12/02 1300) Resp:  [14-26] 16 (12/02 1300) BP: (92-144)/(48-72) 126/67 mmHg (12/02 1300) SpO2:  [100 %] 100 % (12/02 1300) FiO2 (%):  [35 %] 35 % (12/02 1209) Weight:  [68.6 kg (151 lb 3.8 oz)] 68.6 kg (151 lb 3.8 oz) (12/02 0500)  Intake/Output from previous day: 12/01 0701 - 12/02 0700 In: 2358.3 [I.V.:1238.6; IV Piggyback:155; TPN:964.7] Out: 2000 [Urine:2000] Intake/Output this shift: Total I/O In: 2018 [I.V.:1988; IV Piggyback:30] Out: 409 [Urine:775] Nutritional status: Diet NPO time specified   Lab Results: Results for orders placed or performed during the hospital encounter of 12/03/14 (from the past 48 hour(s))  Glucose, capillary     Status: Abnormal   Collection Time: 12/06/14  7:58 PM  Result Value Ref Range   Glucose-Capillary 173 (H) 65 - 99 mg/dL   Comment 1 Document in Chart   Glucose, capillary     Status: Abnormal   Collection Time: 12/07/14 12:16 AM  Result Value Ref  Range   Glucose-Capillary 201 (H) 65 - 99 mg/dL   Comment 1 Notify RN   Blood gas, arterial     Status: Abnormal   Collection Time: 12/07/14  3:30 AM  Result Value Ref Range   FIO2 35.00    Delivery systems VENTILATOR    Mode PRESSURE REGULATED VOLUME CONTROL    VT 500 mL   LHR 20  resp/min   Peep/cpap 5.0 cm H20   pH, Arterial 7.368 7.350 - 7.450   pCO2 arterial 19.1 (LL) 35.0 - 45.0 mmHg    Comment: CRITICAL RESULT CALLED TO, READ BACK BY AND VERIFIED WITH:  JESSICA H. RN BY K KNICK RRT RCP ON 12/07/2014 AT 0335    pO2, Arterial 126 (H) 80.0 - 100.0 mmHg   Bicarbonate 14.4 (L) 20.0 - 24.0 mEq/L   TCO2 13.8 0 - 100 mmol/L   Acid-Base Excess 13.7 (H) 0.0 - 2.0 mmol/L   O2 Saturation 98.5 %   Patient temperature 37.0    Collection site LEFT RADIAL    Drawn by [id number]    Sample type ARTERIAL    Allens test (pass/fail) PASS PASS  Glucose, capillary     Status: Abnormal   Collection Time: 12/07/14  4:33 AM  Result Value Ref Range   Glucose-Capillary 156 (H) 65 - 99 mg/dL  Basic metabolic panel     Status: Abnormal   Collection Time: 12/07/14  5:00 AM  Result Value Ref Range   Sodium 135 135 - 145 mmol/L   Potassium 5.7 (H) 3.5 - 5.1 mmol/L    Comment: DELTA CHECK NOTED   Chloride 115 (H) 101 - 111 mmol/L   CO2 15 (L) 22 - 32 mmol/L   Glucose, Bld 161 (H) 65 - 99 mg/dL   BUN 31 (H) 6 - 20 mg/dL   Creatinine, Ser 1.31 (H) 0.44 - 1.00 mg/dL   Calcium 6.7 (L) 8.9 - 10.3 mg/dL   GFR calc non Af Amer 41 (L) >60 mL/min   GFR calc Af Amer 47 (L) >60 mL/min    Comment: (NOTE) The eGFR has been calculated using the CKD EPI equation. This calculation has not been validated in all clinical situations. eGFR's persistently <60 mL/min signify possible Chronic Kidney Disease.    Anion gap 5 5 - 15  CBC     Status: Abnormal   Collection Time: 12/07/14  5:00 AM  Result Value Ref Range   WBC 7.7 4.0 - 10.5 K/uL   RBC 3.17 (L) 3.87 - 5.11 MIL/uL   Hemoglobin 10.0 (L) 12.0 - 15.0 g/dL   HCT 29.7 (L) 36.0 - 46.0 %   MCV 93.7 78.0 - 100.0 fL   MCH 31.5 26.0 - 34.0 pg   MCHC 33.7 30.0 - 36.0 g/dL   RDW 23.3 (H) 11.5 - 15.5 %   Platelets 34 (L) 150 - 400 K/uL    Comment: SPECIMEN CHECKED FOR CLOTS CONSISTENT WITH PREVIOUS RESULT   Magnesium     Status: None    Collection Time: 12/07/14  5:00 AM  Result Value Ref Range   Magnesium 1.7 1.7 - 2.4 mg/dL  Phosphorus     Status: Abnormal   Collection Time: 12/07/14  5:00 AM  Result Value Ref Range   Phosphorus 1.7 (L) 2.5 - 4.6 mg/dL  Glucose, capillary     Status: Abnormal   Collection Time: 12/07/14  7:48 AM  Result Value Ref Range   Glucose-Capillary 150 (H) 65 - 99 mg/dL   Comment  1 Notify RN    Comment 2 Document in Chart   Glucose, capillary     Status: Abnormal   Collection Time: 12/07/14 11:52 AM  Result Value Ref Range   Glucose-Capillary 116 (H) 65 - 99 mg/dL   Comment 1 Notify RN    Comment 2 Document in Chart   Glucose, capillary     Status: Abnormal   Collection Time: 12/07/14  4:46 PM  Result Value Ref Range   Glucose-Capillary 105 (H) 65 - 99 mg/dL   Comment 1 Notify RN    Comment 2 Document in Chart   Glucose, capillary     Status: None   Collection Time: 12/07/14  7:50 PM  Result Value Ref Range   Glucose-Capillary 98 65 - 99 mg/dL   Comment 1 Notify RN    Comment 2 Document in Chart   Glucose, capillary     Status: Abnormal   Collection Time: 12/07/14 11:50 PM  Result Value Ref Range   Glucose-Capillary 117 (H) 65 - 99 mg/dL   Comment 1 Notify RN   Glucose, capillary     Status: Abnormal   Collection Time: 12/08/14  4:22 AM  Result Value Ref Range   Glucose-Capillary 136 (H) 65 - 99 mg/dL   Comment 1 Notify RN   Magnesium     Status: Abnormal   Collection Time: 12/08/14  4:33 AM  Result Value Ref Range   Magnesium 1.5 (L) 1.7 - 2.4 mg/dL  Phosphorus     Status: Abnormal   Collection Time: 12/08/14  4:33 AM  Result Value Ref Range   Phosphorus 2.1 (L) 2.5 - 4.6 mg/dL  Comprehensive metabolic panel     Status: Abnormal   Collection Time: 12/08/14  4:33 AM  Result Value Ref Range   Sodium 135 135 - 145 mmol/L   Potassium 4.9 3.5 - 5.1 mmol/L   Chloride 114 (H) 101 - 111 mmol/L   CO2 15 (L) 22 - 32 mmol/L   Glucose, Bld 131 (H) 65 - 99 mg/dL   BUN 27 (H) 6 -  20 mg/dL   Creatinine, Ser 1.13 (H) 0.44 - 1.00 mg/dL   Calcium 7.0 (L) 8.9 - 10.3 mg/dL   Total Protein 4.8 (L) 6.5 - 8.1 g/dL   Albumin 2.0 (L) 3.5 - 5.0 g/dL   AST 29 15 - 41 U/L   ALT 16 14 - 54 U/L   Alkaline Phosphatase 105 38 - 126 U/L   Total Bilirubin 1.0 0.3 - 1.2 mg/dL   GFR calc non Af Amer 49 (L) >60 mL/min   GFR calc Af Amer 57 (L) >60 mL/min    Comment: (NOTE) The eGFR has been calculated using the CKD EPI equation. This calculation has not been validated in all clinical situations. eGFR's persistently <60 mL/min signify possible Chronic Kidney Disease.    Anion gap 6 5 - 15  CBC     Status: Abnormal   Collection Time: 12/08/14  4:33 AM  Result Value Ref Range   WBC 7.1 4.0 - 10.5 K/uL   RBC 2.76 (L) 3.87 - 5.11 MIL/uL   Hemoglobin 8.9 (L) 12.0 - 15.0 g/dL   HCT 25.9 (L) 36.0 - 46.0 %   MCV 93.8 78.0 - 100.0 fL   MCH 32.2 26.0 - 34.0 pg   MCHC 34.4 30.0 - 36.0 g/dL   RDW 23.1 (H) 11.5 - 15.5 %   Platelets 24 (LL) 150 - 400 K/uL    Comment: RESULT REPEATED  AND VERIFIED PLATELET COUNT CONFIRMED BY SMEAR CRITICAL RESULT CALLED TO, READ BACK BY AND VERIFIED WITH: JESSICA HEARN RN ON [id number] AT 0600 BY RESEGGER R   Blood gas, arterial     Status: Abnormal   Collection Time: 12/08/14  4:45 AM  Result Value Ref Range   FIO2 35.00    Delivery systems VENTILATOR    Mode PRESSURE REGULATED VOLUME CONTROL    VT 500 mL   LHR 20.0 resp/min   Peep/cpap 5.0 cm H20   pH, Arterial 7.441 7.350 - 7.450   pCO2 arterial 19.4 (LL) 35.0 - 45.0 mmHg    Comment: CRITICAL RESULT CALLED TO, READ BACK BY AND VERIFIED WITH: HEARN,J.RN AT 0505 12/08/14 BY ANDERSON,S RRT/RCP    pO2, Arterial 139.0 (H) 80.0 - 100.0 mmHg   Bicarbonate 16.6 (L) 20.0 - 24.0 mEq/L   TCO2 12.2 0 - 100 mmol/L   Acid-Base Excess 10.5 (H) 0.0 - 2.0 mmol/L   O2 Saturation 99.2 %   Collection site RIGHT RADIAL    Drawn by [id number]    Sample type ARTERIAL    Allens test (pass/fail) PASS PASS  Glucose,  capillary     Status: Abnormal   Collection Time: 12/08/14  7:28 AM  Result Value Ref Range   Glucose-Capillary 125 (H) 65 - 99 mg/dL   Comment 1 Notify RN    Comment 2 Document in Chart   Glucose, capillary     Status: Abnormal   Collection Time: 12/08/14 11:22 AM  Result Value Ref Range   Glucose-Capillary 135 (H) 65 - 99 mg/dL   Comment 1 Notify RN    Comment 2 Document in Chart   Glucose, capillary     Status: None   Collection Time: 12/08/14  4:28 PM  Result Value Ref Range   Glucose-Capillary 80 65 - 99 mg/dL    Lipid Panel No results for input(s): CHOL, TRIG, HDL, CHOLHDL, VLDL, LDLCALC in the last 72 hours.  Studies/Results:   Medications:  Scheduled Meds: . sodium chloride   Intravenous Once  . antiseptic oral rinse  7 mL Mouth Rinse QID  . chlorhexidine gluconate  15 mL Mouth Rinse BID  . Chlorhexidine Gluconate Cloth  6 each Topical Q0600  . diltiazem  15 mg Intravenous Once  . insulin aspart  0-9 Units Subcutaneous 6 times per day  . lacosamide (VIMPAT) IV  50 mg Intravenous Q12H  . mupirocin ointment  1 application Nasal BID  . pantoprazole (PROTONIX) IV  40 mg Intravenous Q12H  . sodium chloride  3 mL Intravenous Q12H   Continuous Infusions: . dextrose 5 % and 0.45% NaCl 85 mL/hr at 12/08/14 0848  . diltiazem (CARDIZEM) infusion 7.5 mg/hr (12/08/14 1525)  . fentaNYL infusion INTRAVENOUS 75 mcg/hr (12/08/14 1107)   PRN Meds:.acetaminophen, fentaNYL, fentaNYL (SUBLIMAZE) injection, fentaNYL (SUBLIMAZE) injection, midazolam, midazolam     LOS: 5 days   Bronislaw Switzer A. [name], M.D.  Diplomate, Tax adviser of Psychiatry and Neurology ( Neurology).

## 2014-12-08 NOTE — Progress Notes (Signed)
CRITICAL VALUE ALERT  Critical value received:  CO2 19.4 Plt 24  Date of notification:  12/08/14  Time of notification:  0630  Critical value read back:Yes.    Nurse who received alert:  Miquel Dunn   MD notified (1st page):  Shanon Brow  Time of first page:  0630  MD notified (2nd page):  Time of second page:  Responding MD:  Shanon Brow  Time MD responded:  [phone]

## 2014-12-09 DIAGNOSIS — N184 Chronic kidney disease, stage 4 (severe): Secondary | ICD-10-CM

## 2014-12-09 DIAGNOSIS — J9601 Acute respiratory failure with hypoxia: Secondary | ICD-10-CM

## 2014-12-09 DIAGNOSIS — G40901 Epilepsy, unspecified, not intractable, with status epilepticus: Principal | ICD-10-CM

## 2014-12-09 DIAGNOSIS — G9340 Encephalopathy, unspecified: Secondary | ICD-10-CM

## 2014-12-09 DIAGNOSIS — D62 Acute posthemorrhagic anemia: Secondary | ICD-10-CM | POA: Diagnosis present

## 2014-12-09 LAB — CBC
HEMATOCRIT: 23.9 % — AB (ref 36.0–46.0)
HEMOGLOBIN: 8.2 g/dL — AB (ref 12.0–15.0)
MCH: 32.4 pg (ref 26.0–34.0)
MCHC: 34.3 g/dL (ref 30.0–36.0)
MCV: 94.5 fL (ref 78.0–100.0)
Platelets: 25 10*3/uL — CL (ref 150–400)
RBC: 2.53 MIL/uL — AB (ref 3.87–5.11)
RDW: 22.5 % — ABNORMAL HIGH (ref 11.5–15.5)
WBC: 7 10*3/uL (ref 4.0–10.5)

## 2014-12-09 LAB — CULTURE, BLOOD (ROUTINE X 2)
CULTURE: NO GROWTH
Culture: NO GROWTH

## 2014-12-09 LAB — BASIC METABOLIC PANEL
ANION GAP: 5 (ref 5–15)
BUN: 21 mg/dL — ABNORMAL HIGH (ref 6–20)
CALCIUM: 6.8 mg/dL — AB (ref 8.9–10.3)
CO2: 16 mmol/L — ABNORMAL LOW (ref 22–32)
Chloride: 112 mmol/L — ABNORMAL HIGH (ref 101–111)
Creatinine, Ser: 1.08 mg/dL — ABNORMAL HIGH (ref 0.44–1.00)
GFR calc non Af Amer: 52 mL/min — ABNORMAL LOW (ref >60)
GFR, EST AFRICAN AMERICAN: 60 mL/min — AB (ref >60)
GLUCOSE: 120 mg/dL — AB (ref 65–99)
POTASSIUM: 4.5 mmol/L (ref 3.5–5.1)
Sodium: 133 mmol/L — ABNORMAL LOW (ref 135–145)

## 2014-12-09 LAB — DIC (DISSEMINATED INTRAVASCULAR COAGULATION)PANEL
D-Dimer, Quant: 6.5 ug/mL-FEU — ABNORMAL HIGH (ref 0.00–0.50)
Fibrinogen: 795 mg/dL — ABNORMAL HIGH (ref 204–475)
INR: 1.34 (ref 0.00–1.49)
Prothrombin Time: 16.7 seconds — ABNORMAL HIGH (ref 11.6–15.2)

## 2014-12-09 LAB — GLUCOSE, CAPILLARY
GLUCOSE-CAPILLARY: 67 mg/dL (ref 65–99)
GLUCOSE-CAPILLARY: 72 mg/dL (ref 65–99)
GLUCOSE-CAPILLARY: 77 mg/dL (ref 65–99)
GLUCOSE-CAPILLARY: 78 mg/dL (ref 65–99)
GLUCOSE-CAPILLARY: 80 mg/dL (ref 65–99)
Glucose-Capillary: 83 mg/dL (ref 65–99)
Glucose-Capillary: 61 mg/dL — ABNORMAL LOW (ref 65–99)
Glucose-Capillary: 100 mg/dL — ABNORMAL HIGH (ref 65–99)

## 2014-12-09 LAB — DIRECT ANTIGLOBULIN TEST (NOT AT ARMC)
DAT, IGG: NEGATIVE
DAT, complement: NEGATIVE

## 2014-12-09 LAB — DIC (DISSEMINATED INTRAVASCULAR COAGULATION) PANEL
APTT: 45 s — AB (ref 24–37)
PLATELETS: 27 10*3/uL — AB (ref 150–400)
SMEAR REVIEW: NONE SEEN

## 2014-12-09 MED ORDER — SODIUM CHLORIDE 0.9 % IV SOLN
10.0000 mg/h | INTRAVENOUS | Status: DC
  Filled 2014-12-09: qty 10

## 2014-12-09 MED ORDER — DEXTROSE 50 % IV SOLN
25.0000 mL | Freq: Once | INTRAVENOUS | Status: AC
  Administered 2014-12-09: 25 mL via INTRAVENOUS

## 2014-12-09 MED ORDER — SODIUM CHLORIDE 0.9 % IV SOLN
1.0000 mg/h | INTRAVENOUS | Status: DC
  Filled 2014-12-09: qty 10

## 2014-12-09 MED ORDER — CETYLPYRIDINIUM CHLORIDE 0.05 % MT LIQD
7.0000 mL | Freq: Two times a day (BID) | OROMUCOSAL | Status: DC
  Administered 2014-12-09 – 2014-12-18 (×16): 7 mL via OROMUCOSAL

## 2014-12-09 MED ORDER — ACETAMINOPHEN 10 MG/ML IV SOLN
1000.0000 mg | Freq: Once | INTRAVENOUS | Status: AC
  Administered 2014-12-10: 1000 mg via INTRAVENOUS
  Filled 2014-12-09: qty 100

## 2014-12-09 MED ORDER — PANTOPRAZOLE SODIUM 40 MG IV SOLR
40.0000 mg | INTRAVENOUS | Status: DC
  Administered 2014-12-10 – 2014-12-13 (×4): 40 mg via INTRAVENOUS
  Filled 2014-12-09 (×4): qty 40

## 2014-12-09 MED ORDER — BUMETANIDE 0.25 MG/ML IJ SOLN
1.0000 mg | Freq: Once | INTRAMUSCULAR | Status: AC
  Administered 2014-12-09: 1 mg via INTRAVENOUS
  Filled 2014-12-09: qty 4

## 2014-12-09 MED ORDER — DEXTROSE 50 % IV SOLN
INTRAVENOUS | Status: AC
  Filled 2014-12-09: qty 50

## 2014-12-09 MED ORDER — LACOSAMIDE 200 MG/20ML IV SOLN
INTRAVENOUS | Status: AC
  Filled 2014-12-09: qty 20

## 2014-12-09 NOTE — Procedures (Signed)
Extubation Procedure Note  Patient Details:   Name: Catherine Mccullough DOB: 03-Jul-1945 MRN: [id number]   Airway Documentation:  Airway 7.5 mm (Active)  Secured at (cm) 23 cm 12/09/2014  9:38 AM  Measured From Lips 12/09/2014  9:38 AM  Secured Location Left 12/09/2014  9:38 AM  Secured By [employer] 12/09/2014  9:38 AM  Tube Holder Repositioned Yes 12/09/2014  9:38 AM  Cuff Pressure (cm H2O) 25 cm H2O 12/09/2014  5:00 AM  Site Condition Dry 12/09/2014  9:38 AM    Evaluation  O2 sats: stable throughout Complications: No apparent complications Patient did tolerate procedure well. Bilateral Breath Sounds: Diminished Suctioning: Oral, Airway Yes  Thelma Comp [facility] 12/09/2014, 1:08 PM

## 2014-12-09 NOTE — Progress Notes (Signed)
Subjective: She is responsive. She will follow commands to blink but she does not do a lot with her arms or legs. She has not had any overt seizure activity. Her situation has been thoroughly discussed with her family by Dr. Jerilee Hoh and by palliative care, Quinn Axe and decision has been made for extubation later today.  Objective: Vital signs in last 24 hours: Temp:  [98.2 F (36.8 C)-100.9 F (38.3 C)] 99 F (37.2 C) (12/03 0645) Pulse Rate:  [85-106] 96 (12/03 0645) Resp:  [12-22] 20 (12/03 0645) BP: (91-126)/(27-69) 112/58 mmHg (12/03 0645) SpO2:  [100 %] 100 % (12/03 0645) FiO2 (%):  [35 %] 35 % (12/03 0500) Weight:  [67.1 kg (147 lb 14.9 oz)] 67.1 kg (147 lb 14.9 oz) (12/03 0509) Weight change: -1.5 kg (-3 lb 4.9 oz) Last BM Date: 12/08/14  Intake/Output from previous day: 12/02 0701 - 12/03 0700 In: 3826.2 [I.V.:3616.2; IV Piggyback:160] Out: 2225 [Urine:2225]  PHYSICAL EXAM General appearance: alert and Intubated on the ventilator still on IV sedation but this has been reduced Resp: rhonchi bilaterally Cardio: irregularly irregular rhythm GI: soft, non-tender; bowel sounds normal; no masses,  no organomegaly Extremities: extremities normal, atraumatic, no cyanosis or edema  Lab Results:  Results for orders placed or performed during the hospital encounter of 12/03/14 (from the past 48 hour(s))  Glucose, capillary     Status: Abnormal   Collection Time: 12/07/14 11:52 AM  Result Value Ref Range   Glucose-Capillary 116 (H) 65 - 99 mg/dL   Comment 1 Notify RN    Comment 2 Document in Chart   Glucose, capillary     Status: Abnormal   Collection Time: 12/07/14  4:46 PM  Result Value Ref Range   Glucose-Capillary 105 (H) 65 - 99 mg/dL   Comment 1 Notify RN    Comment 2 Document in Chart   Glucose, capillary     Status: None   Collection Time: 12/07/14  7:50 PM  Result Value Ref Range   Glucose-Capillary 98 65 - 99 mg/dL   Comment 1 Notify RN    Comment 2  Document in Chart   Glucose, capillary     Status: Abnormal   Collection Time: 12/07/14 11:50 PM  Result Value Ref Range   Glucose-Capillary 117 (H) 65 - 99 mg/dL   Comment 1 Notify RN   Glucose, capillary     Status: Abnormal   Collection Time: 12/08/14  4:22 AM  Result Value Ref Range   Glucose-Capillary 136 (H) 65 - 99 mg/dL   Comment 1 Notify RN   Magnesium     Status: Abnormal   Collection Time: 12/08/14  4:33 AM  Result Value Ref Range   Magnesium 1.5 (L) 1.7 - 2.4 mg/dL  Phosphorus     Status: Abnormal   Collection Time: 12/08/14  4:33 AM  Result Value Ref Range   Phosphorus 2.1 (L) 2.5 - 4.6 mg/dL  Comprehensive metabolic panel     Status: Abnormal   Collection Time: 12/08/14  4:33 AM  Result Value Ref Range   Sodium 135 135 - 145 mmol/L   Potassium 4.9 3.5 - 5.1 mmol/L   Chloride 114 (H) 101 - 111 mmol/L   CO2 15 (L) 22 - 32 mmol/L   Glucose, Bld 131 (H) 65 - 99 mg/dL   BUN 27 (H) 6 - 20 mg/dL   Creatinine, Ser 1.13 (H) 0.44 - 1.00 mg/dL   Calcium 7.0 (L) 8.9 - 10.3 mg/dL   Total Protein  4.8 (L) 6.5 - 8.1 g/dL   Albumin 2.0 (L) 3.5 - 5.0 g/dL   AST 29 15 - 41 U/L   ALT 16 14 - 54 U/L   Alkaline Phosphatase 105 38 - 126 U/L   Total Bilirubin 1.0 0.3 - 1.2 mg/dL   GFR calc non Af Amer 49 (L) >60 mL/min   GFR calc Af Amer 57 (L) >60 mL/min    Comment: (NOTE) The eGFR has been calculated using the CKD EPI equation. This calculation has not been validated in all clinical situations. eGFR's persistently <60 mL/min signify possible Chronic Kidney Disease.    Anion gap 6 5 - 15  CBC     Status: Abnormal   Collection Time: 12/08/14  4:33 AM  Result Value Ref Range   WBC 7.1 4.0 - 10.5 K/uL   RBC 2.76 (L) 3.87 - 5.11 MIL/uL   Hemoglobin 8.9 (L) 12.0 - 15.0 g/dL   HCT 25.9 (L) 36.0 - 46.0 %   MCV 93.8 78.0 - 100.0 fL   MCH 32.2 26.0 - 34.0 pg   MCHC 34.4 30.0 - 36.0 g/dL   RDW 23.1 (H) 11.5 - 15.5 %   Platelets 24 (LL) 150 - 400 K/uL    Comment: RESULT REPEATED  AND VERIFIED PLATELET COUNT CONFIRMED BY SMEAR CRITICAL RESULT CALLED TO, READ BACK BY AND VERIFIED WITH: JESSICA HEARN RN ON [id number] AT 0600 BY RESEGGER R   Blood gas, arterial     Status: Abnormal   Collection Time: 12/08/14  4:45 AM  Result Value Ref Range   FIO2 35.00    Delivery systems VENTILATOR    Mode PRESSURE REGULATED VOLUME CONTROL    VT 500 mL   LHR 20.0 resp/min   Peep/cpap 5.0 cm H20   pH, Arterial 7.441 7.350 - 7.450   pCO2 arterial 19.4 (LL) 35.0 - 45.0 mmHg    Comment: CRITICAL RESULT CALLED TO, READ BACK BY AND VERIFIED WITH: HEARN,J.RN AT 0505 12/08/14 BY ANDERSON,S RRT/RCP    pO2, Arterial 139.0 (H) 80.0 - 100.0 mmHg   Bicarbonate 16.6 (L) 20.0 - 24.0 mEq/L   TCO2 12.2 0 - 100 mmol/L   Acid-Base Excess 10.5 (H) 0.0 - 2.0 mmol/L   O2 Saturation 99.2 %   Collection site RIGHT RADIAL    Drawn by [id number]    Sample type ARTERIAL    Allens test (pass/fail) PASS PASS  Glucose, capillary     Status: Abnormal   Collection Time: 12/08/14  7:28 AM  Result Value Ref Range   Glucose-Capillary 125 (H) 65 - 99 mg/dL   Comment 1 Notify RN    Comment 2 Document in Chart   Glucose, capillary     Status: Abnormal   Collection Time: 12/08/14 11:22 AM  Result Value Ref Range   Glucose-Capillary 135 (H) 65 - 99 mg/dL   Comment 1 Notify RN    Comment 2 Document in Chart   Glucose, capillary     Status: None   Collection Time: 12/08/14  4:28 PM  Result Value Ref Range   Glucose-Capillary 80 65 - 99 mg/dL  Glucose, capillary     Status: None   Collection Time: 12/08/14  9:19 PM  Result Value Ref Range   Glucose-Capillary 87 65 - 99 mg/dL   Comment 1 Document in Chart   Glucose, capillary     Status: None   Collection Time: 12/09/14 12:16 AM  Result Value Ref Range   Glucose-Capillary 80 65 - 99  mg/dL   Comment 1 Document in Chart   Basic metabolic panel     Status: Abnormal   Collection Time: 12/09/14  4:55 AM  Result Value Ref Range   Sodium 133 (L) 135 - 145 mmol/L    Potassium 4.5 3.5 - 5.1 mmol/L   Chloride 112 (H) 101 - 111 mmol/L   CO2 16 (L) 22 - 32 mmol/L   Glucose, Bld 120 (H) 65 - 99 mg/dL   BUN 21 (H) 6 - 20 mg/dL   Creatinine, Ser 1.08 (H) 0.44 - 1.00 mg/dL   Calcium 6.8 (L) 8.9 - 10.3 mg/dL   GFR calc non Af Amer 52 (L) >60 mL/min   GFR calc Af Amer 60 (L) >60 mL/min    Comment: (NOTE) The eGFR has been calculated using the CKD EPI equation. This calculation has not been validated in all clinical situations. eGFR's persistently <60 mL/min signify possible Chronic Kidney Disease.    Anion gap 5 5 - 15  CBC     Status: Abnormal   Collection Time: 12/09/14  4:55 AM  Result Value Ref Range   WBC 7.0 4.0 - 10.5 K/uL   RBC 2.53 (L) 3.87 - 5.11 MIL/uL   Hemoglobin 8.2 (L) 12.0 - 15.0 g/dL   HCT 23.9 (L) 36.0 - 46.0 %   MCV 94.5 78.0 - 100.0 fL   MCH 32.4 26.0 - 34.0 pg   MCHC 34.3 30.0 - 36.0 g/dL   RDW 22.5 (H) 11.5 - 15.5 %   Platelets 25 (LL) 150 - 400 K/uL    Comment: SPECIMEN CHECKED FOR CLOTS PLATELET COUNT CONFIRMED BY SMEAR CRITICAL RESULT CALLED TO, READ BACK BY AND VERIFIED WITH: NELSON,T AT 0614 ON 12/09/2014 BY WOODS, M   Glucose, capillary     Status: None   Collection Time: 12/09/14  5:06 AM  Result Value Ref Range   Glucose-Capillary 78 65 - 99 mg/dL   Comment 1 Document in Chart   Glucose, capillary     Status: None   Collection Time: 12/09/14  7:33 AM  Result Value Ref Range   Glucose-Capillary 72 65 - 99 mg/dL   Comment 1 Document in Chart     ABGS  Recent Labs  12/08/14 0445  PHART 7.441  PO2ART 139.0*  TCO2 12.2  HCO3 16.6*   CULTURES Recent Results (from the past 240 hour(s))  Urine culture     Status: None   Collection Time: 12/03/14 11:00 AM  Result Value Ref Range Status   Specimen Description URINE, CATHETERIZED  Final   Special Requests Normal  Final   Culture   Final    NO GROWTH 1 DAY Performed at [facility]    Report Status 12/04/2014 FINAL  Final  Culture, blood  (routine x 2)     Status: None   Collection Time: 12/03/14 11:00 AM  Result Value Ref Range Status   Specimen Description BLOOD RIGHT ANTECUBITAL  Final   Special Requests BOTTLES DRAWN AEROBIC AND ANAEROBIC [place]  Final   Culture NO GROWTH 6 DAYS  Final   Report Status 12/09/2014 FINAL  Final  Culture, blood (routine x 2)     Status: None   Collection Time: 12/03/14 11:20 AM  Result Value Ref Range Status   Specimen Description BLOOD RIGHT ANTECUBITAL  Final   Special Requests BOTTLES DRAWN AEROBIC AND ANAEROBIC [place]  Final   Culture NO GROWTH 6 DAYS  Final   Report Status 12/09/2014 FINAL  Final  CSF culture     Status: None   Collection Time: 12/03/14  4:29 PM  Result Value Ref Range Status   Specimen Description BACK  Final   Special Requests Immunocompromised  Final   Gram Stain   Final    CYTOSPIN SLIDE NO WBC SEEN NO ORGANISMS SEEN CONFIRMED BY R.GREENE    Culture   Final    NO GROWTH 3 DAYS Performed at [facility]    Report Status 12/07/2014 FINAL  Final  Gram stain     Status: None   Collection Time: 12/03/14  4:29 PM  Result Value Ref Range Status   Specimen Description BACK  Final   Special Requests Immunocompromised  Final   Gram Stain NO ORGANISMS SEEN NO WBC SEEN   Final   Report Status 12/03/2014 FINAL  Final  MRSA PCR Screening     Status: Abnormal   Collection Time: 12/04/14  2:31 AM  Result Value Ref Range Status   MRSA by PCR POSITIVE (A) NEGATIVE Final    Comment:        The GeneXpert MRSA Assay (FDA approved for NASAL specimens only), is one component of a comprehensive MRSA colonization surveillance program. It is not intended to diagnose MRSA infection nor to guide or monitor treatment for MRSA infections. RESULT CALLED TO, READ BACK BY AND VERIFIED WITHCarron Curie ON [id number] AT 7867 BY RESSEGGER R    Studies/Results: Dg Chest Port 1 View  12/08/2014  CLINICAL DATA:  Increased fluid retention. Altered mental status  with GI bleed and respiratory distress. History of seizures, congestive heart failure, diabetes, hypertension, peptic ulcer disease and metastatic breast cancer. EXAM: PORTABLE CHEST 1 VIEW COMPARISON:  12/05/2014 and 12/04/2014. FINDINGS: The endotracheal tube tip remains low, approximately 2 cm above the carina. There is increased patient rotation to the right. The heart size and mediastinal contours are stable. Bibasilar atelectasis and probable small bilateral pleural effusions have not significantly changed. No evidence of pneumothorax. IMPRESSION: No significant change in bibasilar airspace opacities and pleural effusions. The endotracheal tube tip remains low. Electronically Signed   By: Richardean Sale M.D.   On: 12/08/2014 21:46    Medications:  Prior to Admission:  Prescriptions prior to admission  Medication Sig Dispense Refill Last Dose  . aspirin EC 81 MG tablet Take 81 mg by mouth daily.   unknown  . bisacodyl (DULCOLAX) 5 MG EC tablet Take 5 mg by mouth daily as needed for moderate constipation.   unknown  . bumetanide (BUMEX) 1 MG tablet Take 1 mg by mouth daily.    unknown at Unknown time  . calcitRIOL (ROCALTROL) 0.25 MCG capsule Take 0.25 mcg by mouth every other day.   unknown  . cetirizine (ZYRTEC) 10 MG tablet Take 5 mg by mouth daily as needed for allergies.   unknown  . diphenhydramine-acetaminophen (TYLENOL PM) 25-500 MG TABS tablet Take 1 tablet by mouth at bedtime as needed (sleep).   unknown  . docusate sodium (COLACE) 100 MG capsule Take 100 mg by mouth 2 (two) times daily.   unknown  . esomeprazole (NEXIUM) 40 MG capsule Take 40 mg by mouth daily before breakfast.     unknown  . fluticasone (FLONASE) 50 MCG/ACT nasal spray Place 1 spray into both nostrils daily as needed for allergies.    unknown  . hydrocortisone cream 1 % Apply 1 application topically daily as needed for itching.   unknown  . LYRICA 75 MG capsule Take 75  mg by mouth 3 (three) times daily.   unknown at  Unknown time  . magnesium oxide (MAG-OX) 400 MG tablet Take 400 mg by mouth daily.   unknown at Unknown time  . Melatonin 3 MG CAPS Take 3 mg by mouth at bedtime.   unknown  . metoprolol (TOPROL-XL) 200 MG 24 hr tablet Take 200 mg by mouth daily. Take along with 50 mg tablet to equal 200 mg.   unknown at Unknown time  . metoprolol succinate (TOPROL-XL) 100 MG 24 hr tablet Take 1 tablet (100 mg total) by mouth daily. Take with or immediately following a meal. 30 tablet 0 unknown  . metoprolol succinate (TOPROL-XL) 50 MG 24 hr tablet Take 50 mg by mouth daily. Take along with 200 mg tablet to equal 250 mg daily.   unknown  . montelukast (SINGULAIR) 10 MG tablet Take 10 mg by mouth at bedtime.     unknown at Unknown time  . Multiple Vitamins-Minerals (MULTIVITAMINS THER. W/MINERALS) TABS Take 1 tablet by mouth daily.     unknown  . oxyCODONE-acetaminophen (PERCOCET) 10-325 MG per tablet Take 1 tablet by mouth every 6 (six) hours as needed for pain. 20 tablet 0 unknown  . polyethylene glycol (MIRALAX / GLYCOLAX) packet Take 17 g by mouth daily as needed (constipation).    unknown  . PROAIR HFA 108 (90 BASE) MCG/ACT inhaler Take 2 puffs by mouth every 6 (six) hours as needed.   unknown  . sertraline (ZOLOFT) 50 MG tablet Take 100 mg by mouth at bedtime.   unknown at Unknown time  . clonazePAM (KLONOPIN) 0.5 MG tablet Take 1 tablet by mouth twice daily x 30 days. (use for Klonopin) (Patient not taking: Reported on 12/04/2014) 30 tablet 0 Not Taking at Unknown time  . sodium bicarbonate 650 MG tablet Take 1 tablet (650 mg total) by mouth 2 (two) times daily. (Patient not taking: Reported on 12/04/2014)   Not Taking at Unknown time   Scheduled: . sodium chloride   Intravenous Once  . antiseptic oral rinse  7 mL Mouth Rinse QID  . chlorhexidine gluconate  15 mL Mouth Rinse BID  . diltiazem  15 mg Intravenous Once  . insulin aspart  0-9 Units Subcutaneous 6 times per day  . lacosamide (VIMPAT) IV  50 mg  Intravenous Q12H  . pantoprazole (PROTONIX) IV  40 mg Intravenous Q12H  . sodium chloride  3 mL Intravenous Q12H   Continuous: . dextrose 5 % and 0.45% NaCl 85 mL/hr at 12/08/14 0848  . diltiazem (CARDIZEM) infusion 2.5 mg/hr (12/09/14 0314)  . fentaNYL infusion INTRAVENOUS 25 mcg/hr (12/09/14 0825)   KZS:[id number], fentaNYL, fentaNYL (SUBLIMAZE) injection, fentaNYL (SUBLIMAZE) injection, midazolam, midazolam  Assesment: She was admitted with altered mental status after being down at home for what may have been 3 days. She ended up intubated on mechanical ventilation and with severe metabolic acidemia. She is to be extubated later today. She has a baseline diagnosis of breast cancer with metastases to her bone. She had discussed with her family that she did not want long-term ventilator care. I think there is some possibility that she will be able to come off the ventilator and survive and do fairly well. We were ready to extubate her about 4 days ago which he developed atrial fibrillation with rapid ventricular response. Principal Problem:   Altered mental status Active Problems:   Breast cancer metastasized to bone (HCC)   Dehydration   CKD (chronic kidney disease) stage 4, GFR  15-29 ml/min (HCC)   DM type 2 (diabetes mellitus, type 2) (HCC)   Mallory-Weiss tear   Encephalopathy acute   Respiratory failure ([place])   Seizure ([place])   Palliative care encounter   DNR (do not resuscitate) discussion    Plan: I would plan to leave her on low-dose to moderate dose fentanyl and allow her to get versed if needed. If she does okay after extubation I would continue to try to keep her comfortable of course. No plans to reintubate. If she does not do well in clearly is struggling after extubation at that point I would initiate morphine and Versed continuous infusion.    LOS: 6 days   Catherine Mccullough L 12/09/2014, 9:25 AM

## 2014-12-09 NOTE — Plan of Care (Signed)
Problem: Pain Managment: Goal: General experience of comfort will improve Outcome: Progressing Pain able to nod yes and no gestures for pain

## 2014-12-09 NOTE — Progress Notes (Signed)
CRITICAL VALUE ALERT  Critical value received:  Plts 25  Date of notification:  12/09/14  Time of notification: 0621  Critical value read back: yes  Nurse who received alert:  Candiss Norse RN  MD notified (1st page):  Dr. Loralee Pacas  Time of first page:  [phone]  MD notified (2nd page):  Time of second page:  Responding MD:  Juluis Rainier.  Time MD responded:  Previous value 24

## 2014-12-09 NOTE — Plan of Care (Signed)
Problem: Education: Goal: Knowledge of [facility] Education information/materials will improve Outcome: Progressing Plan of care for the shift discussed

## 2014-12-09 NOTE — Progress Notes (Signed)
Pt extubated at 1252 this afternoon.  Pt alert and gesturing appropriately.  Pt hesitant to speak d/t sore throat, but was able to verbalize her name.

## 2014-12-09 NOTE — Progress Notes (Addendum)
[facility] PROGRESS NOTE  Catherine Mccullough [id number] DOB: 04/24/1945 DOA: 12/03/2014 PCP: Default, Provider, MD    Code Status: DO NOT RESUSCITATE Family Communication: Family not available; awaiting their viable Disposition Plan: Disposition unknown at this time due to patient's ventilator dependent respiratory failure   Consultants:  Pulmonology, Dr. Luan Pulling  Neurology, Dr. [name]  Gastroenterology, Dr. Oneida Alar  Procedures:  Mechanical intubation  EGD  Lumbar puncture  Antibiotics:  Previously Zosyn and Zyvox, now discontinued.  HPI/Subjective: The patient remains intubated. She is awake, and tracts. She does not answer with nods.  Objective: Filed Vitals:   12/09/14 0645 12/09/14 0938  BP: 112/58   Pulse: 96 110  Temp: 99 F (37.2 C)   Resp: 20 20   oxygen saturation 100%.  Intake/Output Summary (Last 24 hours) at 12/09/14 1034 Last data filed at 12/09/14 0600  Gross per 24 hour  Intake 2408.02 ml  Output   1450 ml  Net 958.02 ml   Filed Weights   12/07/14 0500 12/08/14 0500 12/09/14 0509  Weight: 65.3 kg (143 lb 15.4 oz) 68.6 kg (151 lb 3.8 oz) 67.1 kg (147 lb 14.9 oz)    Exam:   General:  69 year old African-American woman in mild alert and mechanically intubated. No acute distress.  Cardiovascular: S1, S2, with mild tachycardia.  Respiratory: Coarse breath sounds with few bibasilar crackles.  Abdomen: Hypoactive bowel sounds, soft, nontender, nondistended.  Musculoskeletal/extremities: Edematous hands and arms bilaterally with a trace of pedal edema bilaterally. No acute hot red joints.  Neurologic: Her eyes are open and she appears alert. She tracks. When asked to squeeze the examiner's hands, she does not.  Data Reviewed: Basic Metabolic Panel:  Recent Labs Lab 12/04/14 0450  12/05/14 0430 12/05/14 1034 12/06/14 0426 12/07/14 0500 12/08/14 0433 12/09/14 0455  NA  --   < > 146* 147* 140 135 135 133*  K  --   < >  2.8* 3.1* 4.1 5.7* 4.9 4.5  CL  --   < > 119* 119* 118* 115* 114* 112*  CO2  --   < > 17* 15* 16* 15* 15* 16*  GLUCOSE  --   < > 129* 101* 136* 161* 131* 120*  BUN  --   < > 47* 46* 37* 31* 27* 21*  CREATININE  --   < > 1.81* 1.77* 1.49* 1.31* 1.13* 1.08*  CALCIUM  --   < > 6.4* 6.5* 6.6* 6.7* 7.0* 6.8*  MG 1.9  --  1.3*  --  1.7 1.7 1.5*  --   PHOS  --   --  1.0*  --  <1.0* 1.7* 2.1*  --   < > = values in this interval not displayed. Liver Function Tests:  Recent Labs Lab 12/03/14 1100 12/04/14 0115 12/05/14 0430 12/06/14 0426 12/08/14 0433  AST 40 45* 61* 44* 29  ALT 20 18 22 22 16   ALKPHOS 107 85 98 114 105  BILITOT 0.8 0.8 1.3* 1.2 1.0  PROT 8.3* 5.3* 4.8* 4.9* 4.8*  ALBUMIN 4.5 2.7* 2.3* 2.3* 2.0*   No results for input(s): LIPASE, AMYLASE in the last 168 hours.  Recent Labs Lab 12/03/14 1100  AMMONIA 40*   CBC:  Recent Labs Lab 12/03/14 1100 12/04/14 0115  12/05/14 0430 12/06/14 0426 12/07/14 0500 12/08/14 0433 12/09/14 0455  WBC 18.6* 2.1*  < > 5.5 6.8 7.7 7.1 7.0  NEUTROABS 17.0* 1.9  --  4.8  --   --   --   --  HGB 13.7 8.3*  < > 10.2* 9.3* 10.0* 8.9* 8.2*  HCT 40.5 23.7*  < > 29.1* 26.7* 29.7* 25.9* 23.9*  MCV 104.1* 100.4*  < > 90.7 91.4 93.7 93.8 94.5  PLT 205 102*  < > 47* 35* 34* 24* 25*  < > = values in this interval not displayed. Cardiac Enzymes:  Recent Labs Lab 12/03/14 1200 12/05/14 1034  CKTOTAL 356*  --   TROPONINI  --  0.28*   BNP (last 3 results)  Recent Labs  03/04/14 0726 08/21/14 0029  BNP 1040.0* 1557.0*    ProBNP (last 3 results) No results for input(s): PROBNP in the last 8760 hours.  CBG:  Recent Labs Lab 12/08/14 1628 12/08/14 2119 12/09/14 0016 12/09/14 0506 12/09/14 0733  GLUCAP 80 87 80 78 72    Recent Results (from the past 240 hour(s))  Urine culture     Status: None   Collection Time: 12/03/14 11:00 AM  Result Value Ref Range Status   Specimen Description URINE, CATHETERIZED  Final    Special Requests Normal  Final   Culture   Final    NO GROWTH 1 DAY Performed at [facility]    Report Status 12/04/2014 FINAL  Final  Culture, blood (routine x 2)     Status: None   Collection Time: 12/03/14 11:00 AM  Result Value Ref Range Status   Specimen Description BLOOD RIGHT ANTECUBITAL  Final   Special Requests BOTTLES DRAWN AEROBIC AND ANAEROBIC [place]  Final   Culture NO GROWTH 6 DAYS  Final   Report Status 12/09/2014 FINAL  Final  Culture, blood (routine x 2)     Status: None   Collection Time: 12/03/14 11:20 AM  Result Value Ref Range Status   Specimen Description BLOOD RIGHT ANTECUBITAL  Final   Special Requests BOTTLES DRAWN AEROBIC AND ANAEROBIC [place]  Final   Culture NO GROWTH 6 DAYS  Final   Report Status 12/09/2014 FINAL  Final  CSF culture     Status: None   Collection Time: 12/03/14  4:29 PM  Result Value Ref Range Status   Specimen Description BACK  Final   Special Requests Immunocompromised  Final   Gram Stain   Final    CYTOSPIN SLIDE NO WBC SEEN NO ORGANISMS SEEN CONFIRMED BY R.GREENE    Culture   Final    NO GROWTH 3 DAYS Performed at [facility]    Report Status 12/07/2014 FINAL  Final  Gram stain     Status: None   Collection Time: 12/03/14  4:29 PM  Result Value Ref Range Status   Specimen Description BACK  Final   Special Requests Immunocompromised  Final   Gram Stain NO ORGANISMS SEEN NO WBC SEEN   Final   Report Status 12/03/2014 FINAL  Final  MRSA PCR Screening     Status: Abnormal   Collection Time: 12/04/14  2:31 AM  Result Value Ref Range Status   MRSA by PCR POSITIVE (A) NEGATIVE Final    Comment:        The GeneXpert MRSA Assay (FDA approved for NASAL specimens only), is one component of a comprehensive MRSA colonization surveillance program. It is not intended to diagnose MRSA infection nor to guide or monitor treatment for MRSA infections. RESULT CALLED TO, READ BACK BY AND VERIFIED WITHCarron Curie ON [id number] AT [id number] BY RESSEGGER R      Studies: Dg Chest [facility] 1 View  12/08/2014  CLINICAL DATA:  Increased fluid retention. Altered mental status with GI bleed and respiratory distress. History of seizures, congestive heart failure, diabetes, hypertension, peptic ulcer disease and metastatic breast cancer. EXAM: PORTABLE CHEST 1 VIEW COMPARISON:  12/05/2014 and 12/04/2014. FINDINGS: The endotracheal tube tip remains low, approximately 2 cm above the carina. There is increased patient rotation to the right. The heart size and mediastinal contours are stable. Bibasilar atelectasis and probable small bilateral pleural effusions have not significantly changed. No evidence of pneumothorax. IMPRESSION: No significant change in bibasilar airspace opacities and pleural effusions. The endotracheal tube tip remains low. Electronically Signed   By: Richardean Sale M.D.   On: 12/08/2014 21:46    Scheduled Meds: . sodium chloride   Intravenous Once  . antiseptic oral rinse  7 mL Mouth Rinse QID  . chlorhexidine gluconate  15 mL Mouth Rinse BID  . diltiazem  15 mg Intravenous Once  . insulin aspart  0-9 Units Subcutaneous 6 times per day  . lacosamide (VIMPAT) IV  50 mg Intravenous Q12H  . pantoprazole (PROTONIX) IV  40 mg Intravenous Q12H  . sodium chloride  3 mL Intravenous Q12H   Continuous Infusions: . dextrose 5 % and 0.45% NaCl 85 mL/hr at 12/08/14 0848  . diltiazem (CARDIZEM) infusion 2.5 mg/hr (12/09/14 0314)  . fentaNYL infusion INTRAVENOUS 25 mcg/hr (12/09/14 0825)  . midazolam (VERSED) infusion    . 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 Acute encephalopathy; status epilepticus The patient was brought to the hospital after being found her son, down forward is thought to be 3 days. Patient underwent a lumbar puncture which was not indicative of infection, but she did have an elevated CSF protein level. Neurologist, Dr. [name] was consulted. Per his assessment, her acute severe encephalopathy diagnosis is unclear, but with an associated very high CSF protein, seizures, and hypothermia, most likely etiology was seizures. -She was eventually intubated for airway protection and started on fentanyl infusion and Versed infusion. Anticonvulsant treatment  has been changed to VIMPAT. -She has not had any obvious seizures overnight. Her alertness is improving while on the ventilator.  Acute respiratory failure with hypoxia and hypercarbia. The patient was intubated following worsening respiratory status and for airway protection. Pulmonologist, Dr. Luan Pulling was consulted and has been assisting with ventilator management. -Due to the patient's lack of clinical improvement, the apparent plan is for weaning and extubation today and if the patient is unable to tolerate extubation, comfort measures will be pursued with a morphine drip. This was discussed with the patient's family by Dr. Jerilee Hoh and Dr. Luan Pulling. Palliative care also discussed a possible terminal extubation with the family and they were in agreement with extubating the patient with no intent to reintubate her.  Questionable presumed sepsis. Infective cultures were ordered and the patient was started on broad-spectrum antibiotic treatment with Zosyn and Zyvox. There has been no obvious source of infection given the negative chest x-ray, urinalysis, negative CSF cultures to date. Her presentation with hypothermia and hypotension were likely secondary to seizure activity and prolonged fall and state rather than infection.  She  remains without a fever and without a leukocytosis. -Antibiotics were discontinued on 12/08/14.  Hypothermia. The patient's temperature was 4F on admission. This was thought to be secondary to status epilepticus and being found down. With supportive measures, she is now normothermic. -She was started on broad-spectrum antibiotics, but with no evidence of infection, they were discontinued.  Acute renal failure superimposed on stage III chronic kidney disease. The patient's baseline creatinine is 1.4-1.7. It was 2.5 on admission. Her creatinine has returned to baseline or better, ranging from 1.0-1.3.  Upper GI bleed with acute blood loss anemia. Patient underwent EGD on 11/27. She was found to have a Mallory-Weiss tear and erosive gastritis. GI placed 2 clips. -She was transfused a total of 2 units of packed red blood cells so far. -Her hemoglobin is drifting downward. If it falls below 8 and if she tolerates extubation, we'll transfuse more packed red blood cells. -Continue IV Protonix.  Thrombocytopenia. Patient's platelet count was 205 on admission. It has significantly and progressively decreased over the course of the hospitalization. It is now 25. Her TSH and vitamin B12 were within normal. This was discussed with pharmacy who revealed that the patient never received DVT prophylaxis with Lovenox or heparin, but did receive a heparin flush. -There appears to be no obvious bleeding at this time. -Would transfuse platelets if the patient survives extubation and/or if there is obvious bleeding. -We'll order a DIC panel and HIT panel and DAT. We'll consult hematology on Monday. -We will decrease Protonix to once every 24 hours as it can possibly cause thrombocytopenia. -Of note, discussed this with pharmacy who stated that Vimpat can cause thrombocytopenia. I also discussed her thrombocytopenia briefly with neurologist Dr. [name] on the phone who stated that [place] was stopped because it  was a possible a contributor to thrombocytopenia and that all anticonvulsants can potentially cause thrombocytopenia.  Atrial fibrillation with RVR. The patient remains on a diltiazem drip with caution due to her low-normal blood pressures. We'll titrate up accordingly as her blood pressure tolerates it for better control of her heart rate.  Type 2 diabetes mellitus. Currently stable on sliding scale NovoLog.  Nutrition. The patient was started on TPN. It was discontinued on 12/08/14. If the patient tolerates extubation, ST will need to be consulted for swallow evaluation.        Time spent: 45 minutes.    [facility] Pager [phone]. If 7PM-7AM, please contact night-coverage at [email or web address], password [facility] 12/09/2014, 10:34 AM  LOS: 6 days

## 2014-12-10 ENCOUNTER — Inpatient Hospital Stay (HOSPITAL_COMMUNITY): Payer: Medicare Other

## 2014-12-10 LAB — BASIC METABOLIC PANEL
ANION GAP: 9 (ref 5–15)
BUN: 15 mg/dL (ref 6–20)
CALCIUM: 7.2 mg/dL — AB (ref 8.9–10.3)
CO2: 16 mmol/L — ABNORMAL LOW (ref 22–32)
Chloride: 110 mmol/L (ref 101–111)
Creatinine, Ser: 1.12 mg/dL — ABNORMAL HIGH (ref 0.44–1.00)
GFR, EST AFRICAN AMERICAN: 57 mL/min — AB (ref >60)
GFR, EST NON AFRICAN AMERICAN: 49 mL/min — AB (ref >60)
Glucose, Bld: 168 mg/dL — ABNORMAL HIGH (ref 65–99)
Potassium: 3.9 mmol/L (ref 3.5–5.1)
SODIUM: 135 mmol/L (ref 135–145)

## 2014-12-10 LAB — CBC
HCT: 31 % — ABNORMAL LOW (ref 36.0–46.0)
HEMOGLOBIN: 10.3 g/dL — AB (ref 12.0–15.0)
MCH: 31.4 pg (ref 26.0–34.0)
MCHC: 33.2 g/dL (ref 30.0–36.0)
MCV: 94.5 fL (ref 78.0–100.0)
Platelets: 36 10*3/uL — ABNORMAL LOW (ref 150–400)
RBC: 3.28 MIL/uL — AB (ref 3.87–5.11)
RDW: 22.3 % — ABNORMAL HIGH (ref 11.5–15.5)
WBC: 15.5 10*3/uL — AB (ref 4.0–10.5)

## 2014-12-10 LAB — GLUCOSE, CAPILLARY
GLUCOSE-CAPILLARY: 74 mg/dL (ref 65–99)
GLUCOSE-CAPILLARY: 76 mg/dL (ref 65–99)
GLUCOSE-CAPILLARY: 77 mg/dL (ref 65–99)
GLUCOSE-CAPILLARY: 84 mg/dL (ref 65–99)
Glucose-Capillary: 117 mg/dL — ABNORMAL HIGH (ref 65–99)
Glucose-Capillary: 68 mg/dL (ref 65–99)
Glucose-Capillary: 82 mg/dL (ref 65–99)
Glucose-Capillary: 85 mg/dL (ref 65–99)

## 2014-12-10 LAB — C DIFFICILE QUICK SCREEN W PCR REFLEX
C DIFFICILE (CDIFF) INTERP: NEGATIVE
C DIFFICLE (CDIFF) ANTIGEN: NEGATIVE
C Diff toxin: NEGATIVE

## 2014-12-10 LAB — HEPARIN INDUCED PLATELET AB (HIT ANTIBODY): HEPARIN INDUCED PLT AB: 0.225 {OD_unit} (ref 0.000–0.400)

## 2014-12-10 MED ORDER — ONDANSETRON HCL 4 MG/2ML IJ SOLN
4.0000 mg | Freq: Four times a day (QID) | INTRAMUSCULAR | Status: DC | PRN
  Administered 2014-12-10 – 2014-12-11 (×2): 4 mg via INTRAVENOUS
  Filled 2014-12-10 (×2): qty 2

## 2014-12-10 MED ORDER — DILTIAZEM HCL 100 MG IV SOLR: 5.0000 mg/h | INTRAVENOUS | Status: DC

## 2014-12-10 MED ORDER — LACOSAMIDE 200 MG/20ML IV SOLN
INTRAVENOUS | Status: AC
  Filled 2014-12-10: qty 20

## 2014-12-10 MED ORDER — DILTIAZEM HCL 100 MG IV SOLR
5.0000 mg/h | INTRAVENOUS | Status: DC
  Administered 2014-12-10 – 2014-12-11 (×3): 7.5 mg/h via INTRAVENOUS
  Filled 2014-12-10 (×3): qty 100

## 2014-12-10 MED ORDER — ALBUTEROL SULFATE (2.5 MG/3ML) 0.083% IN NEBU: 2.5000 mg | INHALATION_SOLUTION | RESPIRATORY_TRACT | Status: DC | PRN

## 2014-12-10 MED ORDER — MORPHINE SULFATE (PF) 2 MG/ML IV SOLN
2.0000 mg | INTRAVENOUS | Status: DC | PRN
  Administered 2014-12-10 – 2014-12-17 (×7): 2 mg via INTRAVENOUS
  Filled 2014-12-10 (×7): qty 1

## 2014-12-10 MED ORDER — IPRATROPIUM BROMIDE 0.02 % IN SOLN
0.5000 mg | RESPIRATORY_TRACT | Status: DC | PRN
  Administered 2014-12-10: 0.5 mg via RESPIRATORY_TRACT
  Filled 2014-12-10: qty 2.5

## 2014-12-10 MED ORDER — DEXTROSE 50 % IV SOLN
INTRAVENOUS | Status: AC
  Administered 2014-12-10: 50 mL
  Filled 2014-12-10: qty 50

## 2014-12-10 MED ORDER — BUMETANIDE 0.25 MG/ML IJ SOLN
1.0000 mg | Freq: Once | INTRAMUSCULAR | Status: AC
  Administered 2014-12-10: 1 mg via INTRAVENOUS
  Filled 2014-12-10: qty 4

## 2014-12-10 MED ORDER — DILTIAZEM HCL ER COATED BEADS 180 MG PO CP24
180.0000 mg | ORAL_CAPSULE | Freq: Every day | ORAL | Status: DC
  Filled 2014-12-10: qty 1

## 2014-12-10 NOTE — Progress Notes (Addendum)
Subjective: She was able to be extubated yesterday and has done okay. This morning she says she feels okay. No new complaints. No obvious seizure activity  Objective: Vital signs in last 24 hours: Temp:  [99.5 F (37.5 C)-101 F (38.3 C)] 100 F (37.8 C) (12/04 0915) Pulse Rate:  [42-134] 109 (12/04 0915) Resp:  [17-33] 30 (12/04 0915) BP: (96-184)/(20-114) 142/103 mmHg (12/04 0915) SpO2:  [96 %-100 %] 100 % (12/04 0915) FiO2 (%):  [2 %] 2 % (12/03 1313) Weight:  [62.1 kg (136 lb 14.5 oz)] 62.1 kg (136 lb 14.5 oz) (12/04 0500) Weight change: -5 kg (-11 lb 0.4 oz) Last BM Date: 12/10/14  Intake/Output from previous day: 12/03 0701 - 12/04 0700 In: 2230.6 [I.V.:2070.6; IV Piggyback:160] Out: 3850 [Urine:3850]  PHYSICAL EXAM General appearance: alert, cooperative and mild distress Resp: rhonchi bilaterally Cardio: irregularly irregular rhythm GI: soft, non-tender; bowel sounds normal; no masses,  no organomegaly Extremities: extremities normal, atraumatic, no cyanosis or edema  Lab Results:  Results for orders placed or performed during the hospital encounter of 12/03/14 (from the past 48 hour(s))  Glucose, capillary     Status: Abnormal   Collection Time: 12/08/14 11:22 AM  Result Value Ref Range   Glucose-Capillary 135 (H) 65 - 99 mg/dL   Comment 1 Notify RN    Comment 2 Document in Chart   Glucose, capillary     Status: None   Collection Time: 12/08/14  4:28 PM  Result Value Ref Range   Glucose-Capillary 80 65 - 99 mg/dL  Glucose, capillary     Status: None   Collection Time: 12/08/14  9:19 PM  Result Value Ref Range   Glucose-Capillary 87 65 - 99 mg/dL   Comment 1 Document in Chart   Glucose, capillary     Status: None   Collection Time: 12/09/14 12:16 AM  Result Value Ref Range   Glucose-Capillary 80 65 - 99 mg/dL   Comment 1 Document in Chart   Basic metabolic panel     Status: Abnormal   Collection Time: 12/09/14  4:55 AM  Result Value Ref Range   Sodium  133 (L) 135 - 145 mmol/L   Potassium 4.5 3.5 - 5.1 mmol/L   Chloride 112 (H) 101 - 111 mmol/L   CO2 16 (L) 22 - 32 mmol/L   Glucose, Bld 120 (H) 65 - 99 mg/dL   BUN 21 (H) 6 - 20 mg/dL   Creatinine, Ser 1.08 (H) 0.44 - 1.00 mg/dL   Calcium 6.8 (L) 8.9 - 10.3 mg/dL   GFR calc non Af Amer 52 (L) >60 mL/min   GFR calc Af Amer 60 (L) >60 mL/min    Comment: (NOTE) The eGFR has been calculated using the CKD EPI equation. This calculation has not been validated in all clinical situations. eGFR's persistently <60 mL/min signify possible Chronic Kidney Disease.    Anion gap 5 5 - 15  CBC     Status: Abnormal   Collection Time: 12/09/14  4:55 AM  Result Value Ref Range   WBC 7.0 4.0 - 10.5 K/uL   RBC 2.53 (L) 3.87 - 5.11 MIL/uL   Hemoglobin 8.2 (L) 12.0 - 15.0 g/dL   HCT 23.9 (L) 36.0 - 46.0 %   MCV 94.5 78.0 - 100.0 fL   MCH 32.4 26.0 - 34.0 pg   MCHC 34.3 30.0 - 36.0 g/dL   RDW 22.5 (H) 11.5 - 15.5 %   Platelets 25 (LL) 150 - 400 K/uL  Comment: SPECIMEN CHECKED FOR CLOTS PLATELET COUNT CONFIRMED BY SMEAR CRITICAL RESULT CALLED TO, READ BACK BY AND VERIFIED WITH: NELSON,T AT 0614 ON 12/09/2014 BY WOODS, M   Glucose, capillary     Status: None   Collection Time: 12/09/14  5:06 AM  Result Value Ref Range   Glucose-Capillary 78 65 - 99 mg/dL   Comment 1 Document in Chart   Glucose, capillary     Status: None   Collection Time: 12/09/14  7:33 AM  Result Value Ref Range   Glucose-Capillary 72 65 - 99 mg/dL   Comment 1 Document in Chart   Glucose, capillary     Status: None   Collection Time: 12/09/14 11:10 AM  Result Value Ref Range   Glucose-Capillary 67 65 - 99 mg/dL  DIC (disseminated intravasc coag) panel     Status: Abnormal   Collection Time: 12/09/14 11:25 AM  Result Value Ref Range   Prothrombin Time 16.7 (H) 11.6 - 15.2 seconds   INR 1.34 0.00 - 1.49   aPTT 45 (H) 24 - 37 seconds    Comment:        IF BASELINE aPTT IS ELEVATED, SUGGEST PATIENT RISK ASSESSMENT BE  USED TO DETERMINE APPROPRIATE ANTICOAGULANT THERAPY.    Fibrinogen 795 (H) 204 - 475 mg/dL   D-Dimer, Quant 6.50 (H) 0.00 - 0.50 ug/mL-FEU    Comment: (NOTE) At the manufacturer cut-off of 0.50 ug/mL FEU, this assay has been documented to exclude PE with a sensitivity and negative predictive value of 97 to 99%.  At this time, this assay has not been approved by the FDA to exclude DVT/VTE. Results should be correlated with clinical presentation.    Platelets 27 (LL) 150 - 400 K/uL    Comment: SPECIMEN CHECKED FOR CLOTS PLATELET COUNT CONFIRMED BY SMEAR CRITICAL RESULT CALLED TO, READ BACK BY AND VERIFIED WITH: HILTON,L AT 1201 ON 12/09/2014 BY WOODS, M    Smear Review NO SCHISTOCYTES SEEN   Glucose, capillary     Status: None   Collection Time: 12/09/14  1:13 PM  Result Value Ref Range   Glucose-Capillary 83 65 - 99 mg/dL  Glucose, capillary     Status: Abnormal   Collection Time: 12/09/14  4:05 PM  Result Value Ref Range   Glucose-Capillary 61 (L) 65 - 99 mg/dL  Direct antiglobulin test (not at [facility])     Status: None   Collection Time: 12/09/14  4:26 PM  Result Value Ref Range   DAT, complement NEG Performed at [facility]     DAT, IgG NEG   Glucose, capillary     Status: Abnormal   Collection Time: 12/09/14  4:57 PM  Result Value Ref Range   Glucose-Capillary 100 (H) 65 - 99 mg/dL   Comment 1 Document in Chart   Glucose, capillary     Status: None   Collection Time: 12/09/14  7:23 PM  Result Value Ref Range   Glucose-Capillary 77 65 - 99 mg/dL  Glucose, capillary     Status: None   Collection Time: 12/10/14 12:10 AM  Result Value Ref Range   Glucose-Capillary 76 65 - 99 mg/dL  Glucose, capillary     Status: None   Collection Time: 12/10/14  3:43 AM  Result Value Ref Range   Glucose-Capillary 74 65 - 99 mg/dL  Basic metabolic panel     Status: Abnormal   Collection Time: 12/10/14  5:15 AM  Result Value Ref Range   Sodium 135 135 - 145  mmol/L   Potassium  3.9 3.5 - 5.1 mmol/L   Chloride 110 101 - 111 mmol/L   CO2 16 (L) 22 - 32 mmol/L   Glucose, Bld 168 (H) 65 - 99 mg/dL   BUN 15 6 - 20 mg/dL   Creatinine, Ser 1.12 (H) 0.44 - 1.00 mg/dL   Calcium 7.2 (L) 8.9 - 10.3 mg/dL   GFR calc non Af Amer 49 (L) >60 mL/min   GFR calc Af Amer 57 (L) >60 mL/min    Comment: (NOTE) The eGFR has been calculated using the CKD EPI equation. This calculation has not been validated in all clinical situations. eGFR's persistently <60 mL/min signify possible Chronic Kidney Disease.    Anion gap 9 5 - 15  CBC     Status: Abnormal   Collection Time: 12/10/14  5:15 AM  Result Value Ref Range   WBC 15.5 (H) 4.0 - 10.5 K/uL   RBC 3.28 (L) 3.87 - 5.11 MIL/uL   Hemoglobin 10.3 (L) 12.0 - 15.0 g/dL    Comment: DELTA CHECK NOTED RESULT REPEATED AND VERIFIED    HCT 31.0 (L) 36.0 - 46.0 %   MCV 94.5 78.0 - 100.0 fL   MCH 31.4 26.0 - 34.0 pg   MCHC 33.2 30.0 - 36.0 g/dL   RDW 22.3 (H) 11.5 - 15.5 %   Platelets 36 (L) 150 - 400 K/uL    Comment: SPECIMEN CHECKED FOR CLOTS LARGE PLATELETS PRESENT PLATELET COUNT CONFIRMED BY SMEAR   Glucose, capillary     Status: None   Collection Time: 12/10/14  7:43 AM  Result Value Ref Range   Glucose-Capillary 84 65 - 99 mg/dL    ABGS  Recent Labs  12/08/14 0445  PHART 7.441  PO2ART 139.0*  TCO2 12.2  HCO3 16.6*   CULTURES Recent Results (from the past 240 hour(s))  Urine culture     Status: None   Collection Time: 12/03/14 11:00 AM  Result Value Ref Range Status   Specimen Description URINE, CATHETERIZED  Final   Special Requests Normal  Final   Culture   Final    NO GROWTH 1 DAY Performed at [facility]    Report Status 12/04/2014 FINAL  Final  Culture, blood (routine x 2)     Status: None   Collection Time: 12/03/14 11:00 AM  Result Value Ref Range Status   Specimen Description BLOOD RIGHT ANTECUBITAL  Final   Special Requests BOTTLES DRAWN AEROBIC AND ANAEROBIC [place]  Final   Culture NO  GROWTH 6 DAYS  Final   Report Status 12/09/2014 FINAL  Final  Culture, blood (routine x 2)     Status: None   Collection Time: 12/03/14 11:20 AM  Result Value Ref Range Status   Specimen Description BLOOD RIGHT ANTECUBITAL  Final   Special Requests BOTTLES DRAWN AEROBIC AND ANAEROBIC [place]  Final   Culture NO GROWTH 6 DAYS  Final   Report Status 12/09/2014 FINAL  Final  CSF culture     Status: None   Collection Time: 12/03/14  4:29 PM  Result Value Ref Range Status   Specimen Description BACK  Final   Special Requests Immunocompromised  Final   Gram Stain   Final    CYTOSPIN SLIDE NO WBC SEEN NO ORGANISMS SEEN CONFIRMED BY R.GREENE    Culture   Final    NO GROWTH 3 DAYS Performed at [facility]    Report Status 12/07/2014 FINAL  Final  Gram stain  Status: None   Collection Time: 12/03/14  4:29 PM  Result Value Ref Range Status   Specimen Description BACK  Final   Special Requests Immunocompromised  Final   Gram Stain NO ORGANISMS SEEN NO WBC SEEN   Final   Report Status 12/03/2014 FINAL  Final  MRSA PCR Screening     Status: Abnormal   Collection Time: 12/04/14  2:31 AM  Result Value Ref Range Status   MRSA by PCR POSITIVE (A) NEGATIVE Final    Comment:        The GeneXpert MRSA Assay (FDA approved for NASAL specimens only), is one component of a comprehensive MRSA colonization surveillance program. It is not intended to diagnose MRSA infection nor to guide or monitor treatment for MRSA infections. RESULT CALLED TO, READ BACK BY AND VERIFIED WITHCarron Curie ON [id number] AT 6834 BY RESSEGGER R    Studies/Results: Dg Chest Port 1 View  12/08/2014  CLINICAL DATA:  Increased fluid retention. Altered mental status with GI bleed and respiratory distress. History of seizures, congestive heart failure, diabetes, hypertension, peptic ulcer disease and metastatic breast cancer. EXAM: PORTABLE CHEST 1 VIEW COMPARISON:  12/05/2014 and 12/04/2014. FINDINGS: The  endotracheal tube tip remains low, approximately 2 cm above the carina. There is increased patient rotation to the right. The heart size and mediastinal contours are stable. Bibasilar atelectasis and probable small bilateral pleural effusions have not significantly changed. No evidence of pneumothorax. IMPRESSION: No significant change in bibasilar airspace opacities and pleural effusions. The endotracheal tube tip remains low. Electronically Signed   By: Richardean Sale M.D.   On: 12/08/2014 21:46    Medications:  Prior to Admission:  Prescriptions prior to admission  Medication Sig Dispense Refill Last Dose  . aspirin EC 81 MG tablet Take 81 mg by mouth daily.   unknown  . bisacodyl (DULCOLAX) 5 MG EC tablet Take 5 mg by mouth daily as needed for moderate constipation.   unknown  . bumetanide (BUMEX) 1 MG tablet Take 1 mg by mouth daily.    unknown at Unknown time  . calcitRIOL (ROCALTROL) 0.25 MCG capsule Take 0.25 mcg by mouth every other day.   unknown  . cetirizine (ZYRTEC) 10 MG tablet Take 5 mg by mouth daily as needed for allergies.   unknown  . diphenhydramine-acetaminophen (TYLENOL PM) 25-500 MG TABS tablet Take 1 tablet by mouth at bedtime as needed (sleep).   unknown  . docusate sodium (COLACE) 100 MG capsule Take 100 mg by mouth 2 (two) times daily.   unknown  . esomeprazole (NEXIUM) 40 MG capsule Take 40 mg by mouth daily before breakfast.     unknown  . fluticasone (FLONASE) 50 MCG/ACT nasal spray Place 1 spray into both nostrils daily as needed for allergies.    unknown  . hydrocortisone cream 1 % Apply 1 application topically daily as needed for itching.   unknown  . LYRICA 75 MG capsule Take 75 mg by mouth 3 (three) times daily.   unknown at Unknown time  . magnesium oxide (MAG-OX) 400 MG tablet Take 400 mg by mouth daily.   unknown at Unknown time  . Melatonin 3 MG CAPS Take 3 mg by mouth at bedtime.   unknown  . metoprolol (TOPROL-XL) 200 MG 24 hr tablet Take 200 mg by mouth  daily. Take along with 50 mg tablet to equal 200 mg.   unknown at Unknown time  . metoprolol succinate (TOPROL-XL) 100 MG 24 hr tablet Take 1 tablet (  100 mg total) by mouth daily. Take with or immediately following a meal. 30 tablet 0 unknown  . metoprolol succinate (TOPROL-XL) 50 MG 24 hr tablet Take 50 mg by mouth daily. Take along with 200 mg tablet to equal 250 mg daily.   unknown  . montelukast (SINGULAIR) 10 MG tablet Take 10 mg by mouth at bedtime.     unknown at Unknown time  . Multiple Vitamins-Minerals (MULTIVITAMINS THER. W/MINERALS) TABS Take 1 tablet by mouth daily.     unknown  . oxyCODONE-acetaminophen (PERCOCET) 10-325 MG per tablet Take 1 tablet by mouth every 6 (six) hours as needed for pain. 20 tablet 0 unknown  . polyethylene glycol (MIRALAX / GLYCOLAX) packet Take 17 g by mouth daily as needed (constipation).    unknown  . PROAIR HFA 108 (90 BASE) MCG/ACT inhaler Take 2 puffs by mouth every 6 (six) hours as needed.   unknown  . sertraline (ZOLOFT) 50 MG tablet Take 100 mg by mouth at bedtime.   unknown at Unknown time  . clonazePAM (KLONOPIN) 0.5 MG tablet Take 1 tablet by mouth twice daily x 30 days. (use for Klonopin) (Patient not taking: Reported on 12/04/2014) 30 tablet 0 Not Taking at Unknown time  . sodium bicarbonate 650 MG tablet Take 1 tablet (650 mg total) by mouth 2 (two) times daily. (Patient not taking: Reported on 12/04/2014)   Not Taking at Unknown time   Scheduled: . sodium chloride   Intravenous Once  . antiseptic oral rinse  7 mL Mouth Rinse BID  . diltiazem  180 mg Oral Daily  . diltiazem  15 mg Intravenous Once  . insulin aspart  0-9 Units Subcutaneous 6 times per day  . lacosamide (VIMPAT) IV  50 mg Intravenous Q12H  . pantoprazole (PROTONIX) IV  40 mg Intravenous Q24H  . sodium chloride  3 mL Intravenous Q12H   Continuous: . dextrose 5 % and 0.45% NaCl 50 mL/hr at 12/10/14 0753  . midazolam (VERSED) infusion    . morphine     IPJ:[id number],  albuterol, ondansetron (ZOFRAN) IV  Assesment: She was admitted after being found down at home for about 3 days. She was acutely encephalopathic hypotensive requiring pressor support and intensely acidemic. She was intubated for airway protection and because she had acute hypoxic respiratory failure. It appears that part of the problem has been seizures. In addition to that she's had atrial fibrillation with rapid ventricular response. She was able to be extubated yesterday with plans not to reintubate. She's having trouble swallowing and is not able to get her medications down so she'll need to be switched back as far as her Cardizem for now until we can get a speech consultation. She had a Mallory-Weiss tear and acute blood loss anemia. At baseline she has breast cancer which is metastatic to bone. She also has diabetes at baseline. She had acute on chronic renal failure and that has improved. She has had generalized third spacing of fluids and received diuresis yesterday and I will plan to do that again today Principal Problem:   Encephalopathy acute Active Problems:   Breast cancer metastasized to bone (HCC)   Dehydration   Thrombocytopenia (HCC)   CKD (chronic kidney disease) stage 4, GFR 15-29 ml/min (HCC)   DM type 2 (diabetes mellitus, type 2) (HCC)   Mallory-Weiss tear   Acute respiratory failure with hypoxia ([place])   Status epilepticus ([place])   Palliative care encounter   DNR (do not resuscitate) discussion   Acute  blood loss anemia    Plan: Continue current treatments. She's done okay with coming off the ventilator. Restart Cardizem drip. Give her another dose of Bumex. Speech consult when available. Although she is better she is obviously still critically ill with multiple medical problems requiring high complexity decision making and still requires current level of care.    LOS: 7 days   HAWKINS,EDWARD L 12/10/2014, 9:40 AM

## 2014-12-10 NOTE — Progress Notes (Signed)
Follow up note  Patient complaining of increasing abdominal pain for which I reassessed her. She had mild-moderate generalized pain to palpation. Remains hemodynamically stable. Will check a KUB. Will give PRN IV morphine. Family members were updated.

## 2014-12-10 NOTE — Progress Notes (Signed)
[facility] PROGRESS NOTE  ALLERIA BEERMAN [id number] DOB: 01-Dec-1945 DOA: 12/03/2014 PCP: Default, Provider, MD    Code Status: DO NOT RESUSCITATE Family Communication: Family not available Disposition Plan: Patient was extubated yesterday. Will monitor one more day in SDU.    Consultants:  Pulmonology, Dr. Luan Pulling  Neurology, Dr. [name]  Gastroenterology, Dr. Oneida Alar  Procedures:  Mechanical intubation  EGD  Lumbar puncture  Antibiotics:  Previously Zosyn and Zyvox, now discontinued.  HPI/Subjective: Patient is awake and alert, able to follow commands. Breathing comfortably on supplemental oxygen via [place]. She was extubated on 12/09/2014. Remains NPO.   Objective: Filed Vitals:   12/10/14 0911 12/10/14 0915  BP:  142/103  Pulse: 117 109  Temp:  100 F (37.8 C)  Resp: 30 30   oxygen saturation 100%.  Intake/Output Summary (Last 24 hours) at 12/10/14 1002 Last data filed at 12/10/14 0500  Gross per 24 hour  Intake 2230.58 ml  Output   3850 ml  Net -1619.42 ml   Filed Weights   12/08/14 0500 12/09/14 0509 12/10/14 0500  Weight: 68.6 kg (151 lb 3.8 oz) 67.1 kg (147 lb 14.9 oz) 62.1 kg (136 lb 14.5 oz)    Exam:   General:  Extubated, is awake and alert. Mentating well. Follows commands  Cardiovascular: S1, S2, with mild tachycardia.  Respiratory: Coarse breath sounds with few bibasilar crackles.  Abdomen: Hypoactive bowel sounds, soft, nontender, nondistended.  Musculoskeletal/extremities: Edematous hands and arms bilaterally, no edema to lower extremities. No acute hot red joints.  Neurologic: She had a nonfocal exam.   Data Reviewed: Basic Metabolic Panel:  Recent Labs Lab 12/04/14 0450  12/05/14 0430  12/06/14 0426 12/07/14 0500 12/08/14 0433 12/09/14 0455 12/10/14 0515  NA  --   < > 146*  < > 140 135 135 133* 135  K  --   < > 2.8*  < > 4.1 5.7* 4.9 4.5 3.9  CL  --   < > 119*  < > 118* 115* 114* 112* 110  CO2  --   < >  17*  < > 16* 15* 15* 16* 16*  GLUCOSE  --   < > 129*  < > 136* 161* 131* 120* 168*  BUN  --   < > 47*  < > 37* 31* 27* 21* 15  CREATININE  --   < > 1.81*  < > 1.49* 1.31* 1.13* 1.08* 1.12*  CALCIUM  --   < > 6.4*  < > 6.6* 6.7* 7.0* 6.8* 7.2*  MG 1.9  --  1.3*  --  1.7 1.7 1.5*  --   --   PHOS  --   --  1.0*  --  <1.0* 1.7* 2.1*  --   --   < > = values in this interval not displayed. Liver Function Tests:  Recent Labs Lab 12/03/14 1100 12/04/14 0115 12/05/14 0430 12/06/14 0426 12/08/14 0433  AST 40 45* 61* 44* 29  ALT 20 18 22 22 16   ALKPHOS 107 85 98 114 105  BILITOT 0.8 0.8 1.3* 1.2 1.0  PROT 8.3* 5.3* 4.8* 4.9* 4.8*  ALBUMIN 4.5 2.7* 2.3* 2.3* 2.0*   No results for input(s): LIPASE, AMYLASE in the last 168 hours.  Recent Labs Lab 12/03/14 1100  AMMONIA 40*   CBC:  Recent Labs Lab 12/03/14 1100 12/04/14 0115  12/05/14 0430 12/06/14 0426 12/07/14 0500 12/08/14 0433 12/09/14 0455 12/09/14 1125 12/10/14 0515  WBC 18.6* 2.1*  < > 5.5 6.8 7.7  7.1 7.0  --  15.5*  NEUTROABS 17.0* 1.9  --  4.8  --   --   --   --   --   --   HGB 13.7 8.3*  < > 10.2* 9.3* 10.0* 8.9* 8.2*  --  10.3*  HCT 40.5 23.7*  < > 29.1* 26.7* 29.7* 25.9* 23.9*  --  31.0*  MCV 104.1* 100.4*  < > 90.7 91.4 93.7 93.8 94.5  --  94.5  PLT 205 102*  < > 47* 35* 34* 24* 25* 27* 36*  < > = values in this interval not displayed. Cardiac Enzymes:  Recent Labs Lab 12/03/14 1200 12/05/14 1034  CKTOTAL 356*  --   TROPONINI  --  0.28*   BNP (last 3 results)  Recent Labs  03/04/14 0726 08/21/14 0029  BNP 1040.0* 1557.0*    ProBNP (last 3 results) No results for input(s): PROBNP in the last 8760 hours.  CBG:  Recent Labs Lab 12/09/14 1657 12/09/14 1923 12/10/14 0010 12/10/14 0343 12/10/14 0743  GLUCAP 100* 77 76 74 84    Recent Results (from the past 240 hour(s))  Urine culture     Status: None   Collection Time: 12/03/14 11:00 AM  Result Value Ref Range Status   Specimen  Description URINE, CATHETERIZED  Final   Special Requests Normal  Final   Culture   Final    NO GROWTH 1 DAY Performed at [facility]    Report Status 12/04/2014 FINAL  Final  Culture, blood (routine x 2)     Status: None   Collection Time: 12/03/14 11:00 AM  Result Value Ref Range Status   Specimen Description BLOOD RIGHT ANTECUBITAL  Final   Special Requests BOTTLES DRAWN AEROBIC AND ANAEROBIC [place]  Final   Culture NO GROWTH 6 DAYS  Final   Report Status 12/09/2014 FINAL  Final  Culture, blood (routine x 2)     Status: None   Collection Time: 12/03/14 11:20 AM  Result Value Ref Range Status   Specimen Description BLOOD RIGHT ANTECUBITAL  Final   Special Requests BOTTLES DRAWN AEROBIC AND ANAEROBIC [place]  Final   Culture NO GROWTH 6 DAYS  Final   Report Status 12/09/2014 FINAL  Final  CSF culture     Status: None   Collection Time: 12/03/14  4:29 PM  Result Value Ref Range Status   Specimen Description BACK  Final   Special Requests Immunocompromised  Final   Gram Stain   Final    CYTOSPIN SLIDE NO WBC SEEN NO ORGANISMS SEEN CONFIRMED BY R.GREENE    Culture   Final    NO GROWTH 3 DAYS Performed at [facility]    Report Status 12/07/2014 FINAL  Final  Gram stain     Status: None   Collection Time: 12/03/14  4:29 PM  Result Value Ref Range Status   Specimen Description BACK  Final   Special Requests Immunocompromised  Final   Gram Stain NO ORGANISMS SEEN NO WBC SEEN   Final   Report Status 12/03/2014 FINAL  Final  MRSA PCR Screening     Status: Abnormal   Collection Time: 12/04/14  2:31 AM  Result Value Ref Range Status   MRSA by PCR POSITIVE (A) NEGATIVE Final    Comment:        The GeneXpert MRSA Assay (FDA approved for NASAL specimens only), is one component of a comprehensive MRSA colonization surveillance program. It is not intended to  diagnose MRSA infection nor to guide or monitor treatment for MRSA infections. RESULT CALLED TO,  READ BACK BY AND VERIFIED WITHCarron Curie ON [id number] AT [id number] BY RESSEGGER R      Studies: Dg Chest [facility] 1 View  12/08/2014  CLINICAL DATA:  Increased fluid retention. Altered mental status with GI bleed and respiratory distress. History of seizures, congestive heart failure, diabetes, hypertension, peptic ulcer disease and metastatic breast cancer. EXAM: PORTABLE CHEST 1 VIEW COMPARISON:  12/05/2014 and 12/04/2014. FINDINGS: The endotracheal tube tip remains low, approximately 2 cm above the carina. There is increased patient rotation to the right. The heart size and mediastinal contours are stable. Bibasilar atelectasis and probable small bilateral pleural effusions have not significantly changed. No evidence of pneumothorax. IMPRESSION: No significant change in bibasilar airspace opacities and pleural effusions. The endotracheal tube tip remains low. Electronically Signed   By: Richardean Sale M.D.   On: 12/08/2014 21:46    Scheduled Meds: . sodium chloride   Intravenous Once  . antiseptic oral rinse  7 mL Mouth Rinse BID  . bumetanide (BUMEX) IV  1 mg Intravenous Once  . diltiazem  180 mg Oral Daily  . diltiazem  15 mg Intravenous Once  . insulin aspart  0-9 Units Subcutaneous 6 times per day  . lacosamide (VIMPAT) IV  50 mg Intravenous Q12H  . pantoprazole (PROTONIX) IV  40 mg Intravenous Q24H  . sodium chloride  3 mL Intravenous Q12H   Continuous Infusions: . dextrose 5 % and 0.45% NaCl 50 mL/hr at 12/10/14 0753  . midazolam (VERSED) infusion    . morphine     Assessment and plan: Principal Problem:   Encephalopathy acute Active Problems:   Breast cancer metastasized to bone (HCC)   Dehydration   Thrombocytopenia (HCC)   CKD (chronic kidney disease) stage 4, GFR 15-29 ml/min (HCC)   DM type 2 (diabetes mellitus, type 2) (HCC)   Mallory-Weiss tear   Acute respiratory failure with hypoxia ([place])   Status epilepticus ([place])   Palliative care encounter   DNR (do not  resuscitate) discussion   Acute blood loss anemia   1. Acute encephalopathy; status epilepticus The patient was brought to the hospital after being found her son, down forward is thought to be 3 days. Patient underwent a lumbar puncture which was not indicative of infection, but she did have an elevated CSF protein level. Neurologist, Dr. [name] was consulted. Per his assessment, her acute severe encephalopathy diagnosis is unclear, but with an associated very high CSF protein, seizures, and hypothermia, most likely etiology was seizures. -She was eventually intubated for airway protection and started on fentanyl infusion and Versed infusion. Anticonvulsant treatment  has been changed to VIMPAT 50 mg IV q 12 hours. -S/P extubation on 12/09/2014 -No has not had seizure activity since her hospitalization.  -Plan to keep her NPO until evaluated by SLP. -She tolerated extubation fairly well.   Acute respiratory failure with hypoxia and hypercarbia. The patient was intubated following worsening respiratory status and for airway protection. Pulmonologist, Dr. Luan Pulling was consulted and has been assisting with ventilator management. -Due to the patient's lack of clinical improvement, the apparent plan is for weaning and extubation today and if the patient is unable to tolerate extubation, comfort measures will be pursued with a morphine drip. This was discussed with the patient's family by Dr. Jerilee Hoh and Dr. Luan Pulling. Palliative care also discussed a possible terminal extubation with the family and they were in agreement with extubating the  patient with no intent to reintubate her. -She was extubated on 12/09/2014 and has been stable from a respiratory standpoint.  -Plan to continue monitoring her in the SDU for the next 24 hours as she underwent extubation in the last 24 hours.   Questionable presumed sepsis. Infective cultures were ordered and the patient was started on broad-spectrum antibiotic  treatment with Zosyn and Zyvox. There has been no obvious source of infection given the negative chest x-ray, urinalysis, negative CSF cultures to date. Her presentation with hypothermia and hypotension were likely secondary to seizure activity and prolonged fall and state rather than infection. She remains without a fever and without a leukocytosis. -Antibiotics were discontinued on 12/08/14.  Diarrhea -On 12/10/2014 RN reporting several episodes of diarrhea in the past 24 hours.  -Labs showing elevated white of 15.5 today, increased from 7.0. -She did receive broad spectrum antimicrobials during this hospitalization, will check a stool for Cdiff.   Hypothermia. The patient's temperature was 28F on admission. This was thought to be secondary to status epilepticus and being found down. With supportive measures, she is now normothermic. -She was started on broad-spectrum antibiotics, but with no evidence of infection, they were discontinued.  Acute renal failure superimposed on stage III chronic kidney disease. The patient's baseline creatinine is 1.4-1.7. It was 2.5 on admission. Her creatinine has returned to baseline or better, ranging from 1.0-1.3. -On 12/10/2014 kidney function remains stable, with Cr of 1.12 and BUN of 15.   Upper GI bleed with acute blood loss anemia. Patient underwent EGD on 11/27. She was found to have a Mallory-Weiss tear and erosive gastritis. GI placed 2 clips. -She was transfused a total of 2 units of packed red blood cells  -Her hemoglobin is drifting downward. If it falls below 8 and if she tolerates extubation, we'll transfuse more packed red blood cells. -Continue IV Protonix. -On 12/10/2014 repeat labs showing Hg of 10.3 after receiving blood transfusion.   Thrombocytopenia. Patient's platelet count was 205 on admission. It has significantly and progressively decreased over the course of the hospitalization. It is now 25. Her TSH and vitamin B12 were within  normal. This was discussed with pharmacy who revealed that the patient never received DVT prophylaxis with Lovenox or heparin, but did receive a heparin flush. -There appears to be no obvious bleeding at this time. -Would transfuse platelets if the patient survives extubation and/or if there is obvious bleeding. -We'll order a DIC panel and HIT panel and DAT. We'll consult hematology on Monday. -We will decrease Protonix to once every 24 hours as it can possibly cause thrombocytopenia. -Of note, discussed this with pharmacy who stated that Vimpat can cause thrombocytopenia. I also discussed her thrombocytopenia briefly with neurologist Dr. [name] on the phone who stated that [place] was stopped because it was a possible a contributor to thrombocytopenia and that all anticonvulsants can potentially cause thrombocytopenia.  Atrial fibrillation with RVR. The patient remains on a diltiazem drip with caution due to her low-normal blood pressures. We'll titrate up accordingly as her blood pressure tolerates it for better control of her heart rate.          Type 2 diabetes mellitus. Currently stable on sliding scale NovoLog.  Nutrition. The patient was started on TPN. It was discontinued on 12/08/14. If the patient tolerates extubation, ST will need to be consulted for swallow evaluation.   Time spent: 35 minutes.   Kelvin Cellar  [facility] Pager [phone]. If 7PM-7AM, please contact night-coverage at [email or web address],  password [email or web address] 12/10/2014, 10:02 AM  LOS: 7 days

## 2014-12-10 NOTE — Progress Notes (Signed)
Gave atrovent ).5mg  for wheezing. Changed to albuterol as patient takes at home inhaler proair

## 2014-12-10 NOTE — Progress Notes (Signed)
MD switched cardizem to PO route so pt could wean from cardizem gtt.  Pt was unable to swallow applesauce without coughing, so PO cardizem was not given.  MD notifed, and orders given to resume cardizem gtt.  Pt had multiple watery/mucus-like stools, some containing streaks of blood.  Pt was c/o of abdominal discomfort and has been running temperature of 100 degrees F.  MD notified and orders were given to test for C-Diff; results came back negative.

## 2014-12-10 NOTE — Progress Notes (Signed)
Terence Lux, RN and I wasted the remainder of a fentanyl bag with approximately 258mL in the sink.  I, CINDY Harris [place] RN WITNESSED [place] RN WASTE 210 ML OF FENTANYL DRIP FOR THIS PT IN [place].

## 2014-12-11 ENCOUNTER — Inpatient Hospital Stay (HOSPITAL_COMMUNITY): Payer: Medicare Other

## 2014-12-11 LAB — HEPATIC FUNCTION PANEL
ALT: 25 U/L (ref 14–54)
AST: 25 U/L (ref 15–41)
Albumin: 2.1 g/dL — ABNORMAL LOW (ref 3.5–5.0)
Alkaline Phosphatase: 145 U/L — ABNORMAL HIGH (ref 38–126)
BILIRUBIN DIRECT: 0.4 mg/dL (ref 0.1–0.5)
BILIRUBIN TOTAL: 1 mg/dL (ref 0.3–1.2)
Indirect Bilirubin: 0.6 mg/dL (ref 0.3–0.9)
Total Protein: 5.2 g/dL — ABNORMAL LOW (ref 6.5–8.1)

## 2014-12-11 LAB — BASIC METABOLIC PANEL
ANION GAP: 6 (ref 5–15)
BUN: 12 mg/dL (ref 6–20)
CHLORIDE: 111 mmol/L (ref 101–111)
CO2: 17 mmol/L — AB (ref 22–32)
Calcium: 6.7 mg/dL — ABNORMAL LOW (ref 8.9–10.3)
Creatinine, Ser: 1.05 mg/dL — ABNORMAL HIGH (ref 0.44–1.00)
GFR calc non Af Amer: 53 mL/min — ABNORMAL LOW (ref >60)
Glucose, Bld: 153 mg/dL — ABNORMAL HIGH (ref 65–99)
POTASSIUM: 3.4 mmol/L — AB (ref 3.5–5.1)
Sodium: 134 mmol/L — ABNORMAL LOW (ref 135–145)

## 2014-12-11 LAB — GLUCOSE, CAPILLARY
GLUCOSE-CAPILLARY: 77 mg/dL (ref 65–99)
GLUCOSE-CAPILLARY: 58 mg/dL — AB (ref 65–99)
GLUCOSE-CAPILLARY: 51 mg/dL — AB (ref 65–99)
GLUCOSE-CAPILLARY: 68 mg/dL (ref 65–99)
Glucose-Capillary: 69 mg/dL (ref 65–99)
Glucose-Capillary: 77 mg/dL (ref 65–99)
Glucose-Capillary: 71 mg/dL (ref 65–99)

## 2014-12-11 LAB — CBC
HCT: 26.3 % — ABNORMAL LOW (ref 36.0–46.0)
HEMOGLOBIN: 8.9 g/dL — AB (ref 12.0–15.0)
MCH: 32 pg (ref 26.0–34.0)
MCHC: 33.8 g/dL (ref 30.0–36.0)
MCV: 94.6 fL (ref 78.0–100.0)
Platelets: 57 10*3/uL — ABNORMAL LOW (ref 150–400)
RBC: 2.78 MIL/uL — AB (ref 3.87–5.11)
RDW: 22.1 % — ABNORMAL HIGH (ref 11.5–15.5)
WBC: 15.5 10*3/uL — ABNORMAL HIGH (ref 4.0–10.5)

## 2014-12-11 LAB — LIPASE, BLOOD: Lipase: 62 U/L — ABNORMAL HIGH (ref 11–51)

## 2014-12-11 MED ORDER — DEXTROSE 50 % IV SOLN: 25.0000 mL | INTRAVENOUS | Status: DC | PRN

## 2014-12-11 MED ORDER — LACOSAMIDE 200 MG/20ML IV SOLN
INTRAVENOUS | Status: AC
  Filled 2014-12-11: qty 20

## 2014-12-11 MED ORDER — DEXTROSE 50 % IV SOLN
INTRAVENOUS | Status: AC
  Administered 2014-12-11: 50 mL
  Filled 2014-12-11: qty 50

## 2014-12-11 MED ORDER — ACETAMINOPHEN 160 MG/5ML PO SOLN
650.0000 mg | Freq: Four times a day (QID) | ORAL | Status: DC | PRN
  Administered 2014-12-11 – 2014-12-17 (×4): 650 mg via ORAL
  Filled 2014-12-11 (×4): qty 20.3

## 2014-12-11 MED ORDER — POTASSIUM CHLORIDE 2 MEQ/ML IV SOLN
INTRAVENOUS | Status: DC
  Administered 2014-12-11 (×2): via INTRAVENOUS
  Filled 2014-12-11 (×4): qty 1000

## 2014-12-11 NOTE — Progress Notes (Addendum)
[facility] PROGRESS NOTE  Catherine Mccullough [id number] DOB: 02/08/1945 DOA: 12/03/2014 PCP: Default, Provider, MD    Code Status: DO NOT RESUSCITATE Family Communication: Family not available Disposition Plan: Patient was extubated   Will monitor one more day in SDU.    Consultants:  Pulmonology, Dr. Luan Pulling  Neurology, Dr. [name]  Gastroenterology, Dr. Oneida Alar  Procedures:  Mechanical intubation  EGD  Lumbar puncture  Antibiotics:  Previously Zosyn and Zyvox, now discontinued.  HPI/Subjective: Patient is awake and alert, able to follow commands. Breathing comfortably on supplemental oxygen via [place]. She was extubated on 12/09/2014. Remains NPO. Patient still on the Cardizem drip overnight  Objective: Filed Vitals:   12/11/14 0500 12/11/14 0600  BP: 116/43 127/97  Pulse:    Temp: 100.3 F (37.9 C) 100.3 F (37.9 C)  Resp: 23 26   oxygen saturation 100%.  Intake/Output Summary (Last 24 hours) at 12/11/14 0836 Last data filed at 12/11/14 0500  Gross per 24 hour  Intake 413.25 ml  Output   1700 ml  Net -1286.75 ml   Filed Weights   12/09/14 0509 12/10/14 0500 12/11/14 0500  Weight: 67.1 kg (147 lb 14.9 oz) 62.1 kg (136 lb 14.5 oz) 59.8 kg (131 lb 13.4 oz)    Exam:   General:  Extubated, is awake and alert. Mentating well. Follows commands  Cardiovascular: S1, S2, with mild tachycardia.  Respiratory: Coarse breath sounds with few bibasilar crackles.  Abdomen: Hypoactive bowel sounds, soft, nontender, nondistended.  Musculoskeletal/extremities: Edematous hands and arms bilaterally, no edema to lower extremities. No acute hot red joints.  Neurologic: She had a nonfocal exam.   Data Reviewed: Basic Metabolic Panel:  Recent Labs Lab 12/05/14 0430  12/06/14 0426 12/07/14 0500 12/08/14 0433 12/09/14 0455 12/10/14 0515 12/11/14 0435  NA 146*  < > 140 135 135 133* 135 134*  K 2.8*  < > 4.1 5.7* 4.9 4.5 3.9 3.4*  CL 119*  < > 118*  115* 114* 112* 110 111  CO2 17*  < > 16* 15* 15* 16* 16* 17*  GLUCOSE 129*  < > 136* 161* 131* 120* 168* 153*  BUN 47*  < > 37* 31* 27* 21* 15 12  CREATININE 1.81*  < > 1.49* 1.31* 1.13* 1.08* 1.12* 1.05*  CALCIUM 6.4*  < > 6.6* 6.7* 7.0* 6.8* 7.2* 6.7*  MG 1.3*  --  1.7 1.7 1.5*  --   --   --   PHOS 1.0*  --  <1.0* 1.7* 2.1*  --   --   --   < > = values in this interval not displayed. Liver Function Tests:  Recent Labs Lab 12/05/14 0430 12/06/14 0426 12/08/14 0433  AST 61* 44* 29  ALT 22 22 16   ALKPHOS 98 114 105  BILITOT 1.3* 1.2 1.0  PROT 4.8* 4.9* 4.8*  ALBUMIN 2.3* 2.3* 2.0*   No results for input(s): LIPASE, AMYLASE in the last 168 hours. No results for input(s): AMMONIA in the last 168 hours. CBC:  Recent Labs Lab 12/05/14 0430  12/07/14 0500 12/08/14 0433 12/09/14 0455 12/09/14 1125 12/10/14 0515 12/11/14 0435  WBC 5.5  < > 7.7 7.1 7.0  --  15.5* 15.5*  NEUTROABS 4.8  --   --   --   --   --   --   --   HGB 10.2*  < > 10.0* 8.9* 8.2*  --  10.3* 8.9*  HCT 29.1*  < > 29.7* 25.9* 23.9*  --  31.0* 26.3*  MCV 90.7  < > 93.7 93.8 94.5  --  94.5 94.6  PLT 47*  < > 34* 24* 25* 27* 36* 57*  < > = values in this interval not displayed. Cardiac Enzymes:  Recent Labs Lab 12/05/14 1034  TROPONINI 0.28*   BNP (last 3 results)  Recent Labs  03/04/14 0726 08/21/14 0029  BNP 1040.0* 1557.0*    ProBNP (last 3 results) No results for input(s): PROBNP in the last 8760 hours.  CBG:  Recent Labs Lab 12/10/14 1726 12/10/14 1934 12/10/14 2337 12/11/14 0619 12/11/14 0817  GLUCAP 117* 82 85 77 69    Recent Results (from the past 240 hour(s))  Urine culture     Status: None   Collection Time: 12/03/14 11:00 AM  Result Value Ref Range Status   Specimen Description URINE, CATHETERIZED  Final   Special Requests Normal  Final   Culture   Final    NO GROWTH 1 DAY Performed at [facility]    Report Status 12/04/2014 FINAL  Final  Culture, blood  (routine x 2)     Status: None   Collection Time: 12/03/14 11:00 AM  Result Value Ref Range Status   Specimen Description BLOOD RIGHT ANTECUBITAL  Final   Special Requests BOTTLES DRAWN AEROBIC AND ANAEROBIC [place]  Final   Culture NO GROWTH 6 DAYS  Final   Report Status 12/09/2014 FINAL  Final  Culture, blood (routine x 2)     Status: None   Collection Time: 12/03/14 11:20 AM  Result Value Ref Range Status   Specimen Description BLOOD RIGHT ANTECUBITAL  Final   Special Requests BOTTLES DRAWN AEROBIC AND ANAEROBIC [place]  Final   Culture NO GROWTH 6 DAYS  Final   Report Status 12/09/2014 FINAL  Final  CSF culture     Status: None   Collection Time: 12/03/14  4:29 PM  Result Value Ref Range Status   Specimen Description BACK  Final   Special Requests Immunocompromised  Final   Gram Stain   Final    CYTOSPIN SLIDE NO WBC SEEN NO ORGANISMS SEEN CONFIRMED BY R.GREENE    Culture   Final    NO GROWTH 3 DAYS Performed at [facility]    Report Status 12/07/2014 FINAL  Final  Gram stain     Status: None   Collection Time: 12/03/14  4:29 PM  Result Value Ref Range Status   Specimen Description BACK  Final   Special Requests Immunocompromised  Final   Gram Stain NO ORGANISMS SEEN NO WBC SEEN   Final   Report Status 12/03/2014 FINAL  Final  MRSA PCR Screening     Status: Abnormal   Collection Time: 12/04/14  2:31 AM  Result Value Ref Range Status   MRSA by PCR POSITIVE (A) NEGATIVE Final    Comment:        The GeneXpert MRSA Assay (FDA approved for NASAL specimens only), is one component of a comprehensive MRSA colonization surveillance program. It is not intended to diagnose MRSA infection nor to guide or monitor treatment for MRSA infections. RESULT CALLED TO, READ BACK BY AND VERIFIED WITH: Carron Curie ON [id number] AT 0610 BY RESSEGGER R   C difficile quick scan w PCR reflex     Status: None   Collection Time: 12/10/14 11:50 AM  Result Value Ref Range  Status   C Diff antigen NEGATIVE NEGATIVE Final   C Diff toxin NEGATIVE NEGATIVE Final   C Diff interpretation  Negative for toxigenic C. difficile  Final     Studies: Dg Abd 1 View  12/10/2014  CLINICAL DATA:  Abdominal pain EXAM: ABDOMEN - 1 VIEW COMPARISON:  None. FINDINGS: Nonobstructive bowel gas pattern. Moderate lumbar levoscoliosis with mild degenerative changes. Visualized bony pelvis appears intact. IMPRESSION: Unremarkable abdominal radiograph. Electronically Signed   By: Julian Hy M.D.   On: 12/10/2014 16:33    Scheduled Meds: . sodium chloride   Intravenous Once  . antiseptic oral rinse  7 mL Mouth Rinse BID  . diltiazem  15 mg Intravenous Once  . insulin aspart  0-9 Units Subcutaneous 6 times per day  . lacosamide (VIMPAT) IV  50 mg Intravenous Q12H  . pantoprazole (PROTONIX) IV  40 mg Intravenous Q24H  . sodium chloride  3 mL Intravenous Q12H   Continuous Infusions: . dextrose 5 %-0.9% nacl with kcl    . diltiazem (CARDIZEM) infusion 7.5 mg/hr (12/11/14 0550)  . 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 Acute encephalopathy; status epilepticus The patient was brought to the hospital after being found her son, down forward is thought to be 3 days. Patient underwent a lumbar puncture which was not indicative of infection, but she did have an elevated CSF protein level. Neurologist, Dr. [name] was consulted. Per his assessment, her acute severe encephalopathy diagnosis is unclear, but with an associated very high CSF protein,  seizures, and hypothermia, most likely etiology was seizures. -She was eventually intubated for airway protection and started on fentanyl infusion and Versed infusion. Anticonvulsant treatment  has been changed to VIMPAT 50 mg IV q 12 hours. -S/P extubation on 12/09/2014 -No has not had seizure activity since her hospitalization.  -Plan to keep her NPO until evaluated by SLP.unable to swallow pills    Acute respiratory failure with hypoxia and hypercarbia. The patient was intubated following worsening respiratory status and for airway protection. Pulmonologist, Dr. Luan Pulling was consulted and has been assisting with ventilator management. -Due to the patient's lack of clinical improvement, the apparent plan is for weaning and extubation today and if the patient is unable to tolerate extubation, comfort measures will be pursued with a morphine drip. This was discussed with the patient's family by Dr. Jerilee Hoh and Dr. Luan Pulling. Palliative care also discussed a possible terminal extubation with the family and they were in agreement with extubating the patient with no intent to reintubate her. -She was extubated on 12/09/2014 and has been stable from a respiratory standpoint.  -Plan to continue monitoring her in the SDU until the patient is able to swallow medications   Questionable presumed sepsis. Infective cultures were ordered and the patient was started on broad-spectrum antibiotic treatment with Zosyn and Zyvox. There has been no obvious source of infection given the negative chest x-ray, urinalysis, negative CSF cultures to date. Her presentation with hypothermia and hypotension were likely secondary to seizure activity and prolonged fall and state rather than infection. She remains without a fever and without a leukocytosis. -Antibiotics were discontinued on 12/08/14.  Diarrhea -  On 12/10/2014 RN reporting several episodes of diarrhea in the past 24 hours.  -Labs showing elevated white of 15.5  today, increased from 7.0. -She did receive broad spectrum antimicrobials during this hospitalization, C. Difficile negative May need to obtain CT scan with IV contrast to rule out colitis if diarrhea continues  Hypothermia. The patient's temperature was 13F on admission. This was thought to be secondary to status epilepticus and being found down. With supportive measures, she is now normothermic. -She was started on broad-spectrum antibiotics, but with no evidence of infection, they were discontinued.   Acute renal failure superimposed on stage III chronic kidney disease. The patient's baseline creatinine is 1.4-1.7. It was 2.5 on admission. Her creatinine has returned to baseline 1.05-.On 12/10/2014 kidney function remains stable, with Cr of 1.12 and BUN of 15.    Upper GI bleed with acute blood loss anemia. Patient underwent EGD on 11/27. She was found to have a Mallory-Weiss tear and erosive gastritis. GI placed 2 clips. -She was transfused a total of 2 units of packed red blood cells  -Her hemoglobin is drifting downward. If it falls below 8 and if she tolerates extubation, we'll transfuse more packed red blood cells. -Continue IV Protonix. Hemoglobin drop from 10.3> 8.9, continue to follow   Thrombocytopenia. Patient's platelet count was 205 on admission. It has significantly and progressively decreased over the course of the hospitalization. It dropped to 24, now 57, improving. Her TSH and vitamin B12 were within normal. This was discussed with pharmacy who revealed that the patient never received DVT prophylaxis with Lovenox or heparin, but did receive a heparin flush. -There appears to be no obvious bleeding at this time. -Would transfuse platelets if the patient survives extubation and/or if there is obvious bleeding. -We'll order a DIC panel and HIT panel and DAT. We'll consult hematology if the platelet count continues to drop -We will decrease Protonix to once every 24 hours  as it can possibly cause thrombocytopenia. -Of note, discussed this with pharmacy who stated that Vimpat can cause thrombocytopenia. I also discussed her thrombocytopenia briefly with neurologist Dr. [name] on the phone who stated that Eagar was stopped because it was a possible a contributor to thrombocytopenia and that all anticonvulsants can potentially cause thrombocytopenia.    Atrial fibrillation with RVR. The patient remains on a diltiazem drip, due to inability to swallow pills We'll titrate up accordingly as her blood pressure tolerates it for better control of her heart rate.          Type 2 diabetes mellitus. Currently stable on sliding scale NovoLog.  Nutrition. The patient was started on TPN. It was discontinued on 12/08/14. If the patient tolerates extubation, ST will need to be consulted for swallow evaluation.   Time spent: 35 minutes.   [facility] Pager [phone]. If 7PM-7AM, please contact night-coverage at [email or web address], password [facility] 12/11/2014, 8:36 AM  LOS: 8 days

## 2014-12-11 NOTE — Progress Notes (Signed)
Versed 50mg  bag and Morphine 250mg  bag wasted sink witnessed by Eula Listen.

## 2014-12-11 NOTE — Progress Notes (Signed)
Patient ID: Catherine Mccullough, female   DOB: 1945/04/12, 69 y.o.   MRN: [id number]  Gratis A. [name], MD     [email or web address]          Catherine Mccullough is an 69 y.o. female.   Assessment/Plan: Acute severe encephalopathy of unclear etiology but associated with very high CSF protein, seizures and hypothermia. CSF analysis and other testing does not support diagnosis of infection at this time. The differential diagnosis for very high CSF protein includes bacterial related meningitis, TB meningitis, brain tumors, intracranial trauma and intracranial hemorrhage. Given the seizures, I think this is the most likely etiology however. 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  Thrombocytopenia -  Multifactorial but Keppra very rarely can cause thrombocytopenia. ( Now improved off Keppra)  Atrial fibrillation.     RECOMMENDATION: Repeat CBC.  Increase the dose of Vimpat. Imaging of the brain with IV contrast once the patient is stable. MRI is preferable. Avoid sedatives.    The patient was extubated over the weekend. She appears to be improved cognitively.  HEENT: Supple. Atraumatic normocephalic.   ABDOMEN: soft  EXTREMITIES: No edema   BACK: Normal.  SKIN: Normal by inspection.   MENTAL STATUS: She is laying in bed with eyes closed. She opens her eyes to verbal commands. She follows midline commands consistently and appendicular commands with prompting.  CRANIAL NERVES: The pupils are deviated to the left and downward;  upper and lower facial muscles are normal in strength and symmetric, there is no flattening of the nasolabial folds. Cough and gag reflexes are intact. She has full extraocular movements. No nystagmus is appreciated.  MOTOR: Bulk and tone  are normal throughout.  She has antigravity strength of the upper extremities.  COORDINATION: No tremors or dysmetria is appreciated.  REFLEXES: Deep tendon reflexes are symmetrical and normal. Babinski reflexes are flexor on the right but upgoing on the left  SENSATION: She responds to deep painful stimuli bilaterally   EEG shows moderate to severe global slowing. No epileptiform activities.   Objective: Vital signs in last 24 hours: Temp:  [100 F (37.8 C)-100.9 F (38.3 C)] 100.3 F (37.9 C) (12/05 0600) Pulse Rate:  [55-120] 110 (12/05 0300) Resp:  [18-40] 26 (12/05 0600) BP: (90-149)/(43-126) 127/97 mmHg (12/05 0600) SpO2:  [55 %-100 %] 100 % (12/05 0300) Weight:  [59.8 kg (131 lb 13.4 oz)] 59.8 kg (131 lb 13.4 oz) (12/05 0500)  Intake/Output from previous day: 12/04 0701 - 12/05 0700 In: 413.3 [I.V.:383.3; IV Piggyback:30] Out: 1700 [Urine:1700] Intake/Output this shift:   Nutritional status: Diet NPO time specified   Lab Results: Results for orders placed or performed during the hospital encounter of 12/03/14 (from the past 48 hour(s))  Glucose, capillary     Status: None   Collection Time: 12/09/14 11:10 AM  Result Value Ref Range   Glucose-Capillary 67 65 - 99 mg/dL  Heparin induced platelet Ab (HIT antibody)     Status: None   Collection Time: 12/09/14 11:25 AM  Result Value Ref Range   Heparin Induced Plt Ab 0.225 0.000 - 0.400 OD    Comment: (NOTE) Performed At: [facility] [address] [place], [place] [id number] Lindon Romp MD VX:[id number]   DIC (disseminated intravasc coag) panel     Status: Abnormal   Collection Time: 12/09/14 11:25  AM  Result Value Ref Range   Prothrombin Time 16.7 (H) 11.6 - 15.2 seconds   INR 1.34 0.00 - 1.49   aPTT 45 (H) 24 - 37 seconds    Comment:        IF BASELINE aPTT IS ELEVATED, SUGGEST PATIENT RISK ASSESSMENT BE USED TO DETERMINE APPROPRIATE ANTICOAGULANT THERAPY.    Fibrinogen 795 (H) 204 - 475  mg/dL   D-Dimer, Quant 6.50 (H) 0.00 - 0.50 ug/mL-FEU    Comment: (NOTE) At the manufacturer cut-off of 0.50 ug/mL FEU, this assay has been documented to exclude PE with a sensitivity and negative predictive value of 97 to 99%.  At this time, this assay has not been approved by the FDA to exclude DVT/VTE. Results should be correlated with clinical presentation.    Platelets 27 (LL) 150 - 400 K/uL    Comment: SPECIMEN CHECKED FOR CLOTS PLATELET COUNT CONFIRMED BY SMEAR CRITICAL RESULT CALLED TO, READ BACK BY AND VERIFIED WITH: HILTON,L AT 1201 ON 12/09/2014 BY WOODS, M    Smear Review NO SCHISTOCYTES SEEN   Glucose, capillary     Status: None   Collection Time: 12/09/14  1:13 PM  Result Value Ref Range   Glucose-Capillary 83 65 - 99 mg/dL  Glucose, capillary     Status: Abnormal   Collection Time: 12/09/14  4:05 PM  Result Value Ref Range   Glucose-Capillary 61 (L) 65 - 99 mg/dL  Direct antiglobulin test (not at [facility])     Status: None   Collection Time: 12/09/14  4:26 PM  Result Value Ref Range   DAT, complement NEG Performed at [facility]     DAT, IgG NEG   Glucose, capillary     Status: Abnormal   Collection Time: 12/09/14  4:57 PM  Result Value Ref Range   Glucose-Capillary 100 (H) 65 - 99 mg/dL   Comment 1 Document in Chart   Glucose, capillary     Status: None   Collection Time: 12/09/14  7:23 PM  Result Value Ref Range   Glucose-Capillary 77 65 - 99 mg/dL  Glucose, capillary     Status: None   Collection Time: 12/10/14 12:10 AM  Result Value Ref Range   Glucose-Capillary 76 65 - 99 mg/dL  Glucose, capillary     Status: None   Collection Time: 12/10/14  3:43 AM  Result Value Ref Range   Glucose-Capillary 74 65 - 99 mg/dL  Basic metabolic panel     Status: Abnormal   Collection Time: 12/10/14  5:15 AM  Result Value Ref Range   Sodium 135 135 - 145 mmol/L   Potassium 3.9 3.5 - 5.1 mmol/L   Chloride 110 101 - 111 mmol/L   CO2 16 (L) 22 - 32 mmol/L    Glucose, Bld 168 (H) 65 - 99 mg/dL   BUN 15 6 - 20 mg/dL   Creatinine, Ser 1.12 (H) 0.44 - 1.00 mg/dL   Calcium 7.2 (L) 8.9 - 10.3 mg/dL   GFR calc non Af Amer 49 (L) >60 mL/min   GFR calc Af Amer 57 (L) >60 mL/min    Comment: (NOTE) The eGFR has been calculated using the CKD EPI equation. This calculation has not been validated in all clinical situations. eGFR's persistently <60 mL/min signify possible Chronic Kidney Disease.    Anion gap 9 5 - 15  CBC     Status: Abnormal   Collection Time: 12/10/14  5:15 AM  Result Value Ref Range   WBC  15.5 (H) 4.0 - 10.5 K/uL   RBC 3.28 (L) 3.87 - 5.11 MIL/uL   Hemoglobin 10.3 (L) 12.0 - 15.0 g/dL    Comment: DELTA CHECK NOTED RESULT REPEATED AND VERIFIED    HCT 31.0 (L) 36.0 - 46.0 %   MCV 94.5 78.0 - 100.0 fL   MCH 31.4 26.0 - 34.0 pg   MCHC 33.2 30.0 - 36.0 g/dL   RDW 22.3 (H) 11.5 - 15.5 %   Platelets 36 (L) 150 - 400 K/uL    Comment: SPECIMEN CHECKED FOR CLOTS LARGE PLATELETS PRESENT PLATELET COUNT CONFIRMED BY SMEAR   Glucose, capillary     Status: None   Collection Time: 12/10/14  7:43 AM  Result Value Ref Range   Glucose-Capillary 84 65 - 99 mg/dL  Glucose, capillary     Status: None   Collection Time: 12/10/14 11:44 AM  Result Value Ref Range   Glucose-Capillary 77 65 - 99 mg/dL  C difficile quick scan w PCR reflex     Status: None   Collection Time: 12/10/14 11:50 AM  Result Value Ref Range   C Diff antigen NEGATIVE NEGATIVE   C Diff toxin NEGATIVE NEGATIVE   C Diff interpretation Negative for toxigenic C. difficile   Glucose, capillary     Status: None   Collection Time: 12/10/14  4:20 PM  Result Value Ref Range   Glucose-Capillary 68 65 - 99 mg/dL   Comment 1 Document in Chart   Glucose, capillary     Status: Abnormal   Collection Time: 12/10/14  5:26 PM  Result Value Ref Range   Glucose-Capillary 117 (H) 65 - 99 mg/dL   Comment 1 Document in Chart   Glucose, capillary     Status: None   Collection Time:  12/10/14  7:34 PM  Result Value Ref Range   Glucose-Capillary 82 65 - 99 mg/dL  Glucose, capillary     Status: None   Collection Time: 12/10/14 11:37 PM  Result Value Ref Range   Glucose-Capillary 85 65 - 99 mg/dL  Basic metabolic panel     Status: Abnormal   Collection Time: 12/11/14  4:35 AM  Result Value Ref Range   Sodium 134 (L) 135 - 145 mmol/L   Potassium 3.4 (L) 3.5 - 5.1 mmol/L   Chloride 111 101 - 111 mmol/L   CO2 17 (L) 22 - 32 mmol/L   Glucose, Bld 153 (H) 65 - 99 mg/dL   BUN 12 6 - 20 mg/dL   Creatinine, Ser 1.05 (H) 0.44 - 1.00 mg/dL   Calcium 6.7 (L) 8.9 - 10.3 mg/dL   GFR calc non Af Amer 53 (L) >60 mL/min   GFR calc Af Amer >60 >60 mL/min    Comment: (NOTE) The eGFR has been calculated using the CKD EPI equation. This calculation has not been validated in all clinical situations. eGFR's persistently <60 mL/min signify possible Chronic Kidney Disease.    Anion gap 6 5 - 15  CBC     Status: Abnormal   Collection Time: 12/11/14  4:35 AM  Result Value Ref Range   WBC 15.5 (H) 4.0 - 10.5 K/uL   RBC 2.78 (L) 3.87 - 5.11 MIL/uL   Hemoglobin 8.9 (L) 12.0 - 15.0 g/dL   HCT 26.3 (L) 36.0 - 46.0 %   MCV 94.6 78.0 - 100.0 fL   MCH 32.0 26.0 - 34.0 pg   MCHC 33.8 30.0 - 36.0 g/dL   RDW 22.1 (H) 11.5 - 15.5 %  Platelets 57 (L) 150 - 400 K/uL    Comment: SPECIMEN CHECKED FOR CLOTS CONSISTENT WITH PREVIOUS RESULT   Glucose, capillary     Status: None   Collection Time: 12/11/14  6:19 AM  Result Value Ref Range   Glucose-Capillary 77 65 - 99 mg/dL    Lipid Panel No results for input(s): CHOL, TRIG, HDL, CHOLHDL, VLDL, LDLCALC in the last 72 hours.  Studies/Results:   Medications:  Scheduled Meds: . sodium chloride   Intravenous Once  . antiseptic oral rinse  7 mL Mouth Rinse BID  . diltiazem  15 mg Intravenous Once  . insulin aspart  0-9 Units Subcutaneous 6 times per day  . lacosamide (VIMPAT) IV  50 mg Intravenous Q12H  . pantoprazole (PROTONIX) IV  40  mg Intravenous Q24H  . sodium chloride  3 mL Intravenous Q12H   Continuous Infusions: . dextrose 5 % and 0.45% NaCl 50 mL/hr at 12/10/14 1800  . diltiazem (CARDIZEM) infusion 7.5 mg/hr (12/11/14 0550)  . morphine     PRN Meds:.acetaminophen, albuterol, morphine injection, ondansetron (ZOFRAN) IV     LOS: 8 days   Ambert Virrueta A. [name], M.D.  Diplomate, Tax adviser of Psychiatry and Neurology ( Neurology).

## 2014-12-11 NOTE — Evaluation (Signed)
Clinical/Bedside Swallow Evaluation Patient Details  Name: Catherine Mccullough MRN: [id number] Date of Birth: 27-May-1945  Today's Date: 12/11/2014 Time: SLP Start Time (ACUTE ONLY): 39 SLP Stop Time (ACUTE ONLY): 1200 SLP Time Calculation (min) (ACUTE ONLY): 30 min  Past Medical History:  Past Medical History  Diagnosis Date  . Kidney disease   . Hypertension   . Diabetes mellitus   . Cancer ([place])     brest met to bone  . Arthritis   . CHF (congestive heart failure) ([place])   . Lymphedema of leg   . Anxiety   . Pulmonary hypertension ([place])   . 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 Pancreatitis   . Depression   . Anemia   . Shingles   . PUD (peptic ulcer disease)   . GERD (gastroesophageal reflux disease)   . MRSA (methicillin resistant staph aureus) culture positive    Past Surgical History:  Past Surgical History  Procedure Laterality Date  . Breast surgery    . Cholecystectomy    . Abdominal hysterectomy    . Replacement total knee bilateral    . Hand tendon surgery    . Esophagogastroduodenoscopy N/A 12/03/2014    Procedure: ESOPHAGOGASTRODUODENOSCOPY (EGD);  Surgeon: Danie Binder, MD;  Location: [facility];  Service: Endoscopy;  Laterality: N/A;   HPI:  Catherine Mccullough is a 69 yo female who was admitted 12/03/2014 with severe sepsis. She was found down at home for an unknown period of time. She had intense metabolic acidemia. She was intubated 12/03/14  because of respiratory distress and hypoxia. She was eventually able to be extubated 12/09/14 and has done okay. She has had status epilepticus but no overt seizure activity recently. She has breast cancer which is metastatic to bone. She had a Mallory-Weiss tear and acute blood loss anemia. She has improved and has been able to come off the ventilator. She does not have any respiratory problems at baseline. She is having trouble swallowing and is at risk for aspiration and a speech evaluation has  been requested. She was unable to be switched to oral diltiazem because of her difficulty swallowing. CT Abdomen shows: Bilateral pleural effusion with bilateral posterior atelectasis again noted. Old appearing left lower anterior rib fractures with incomplete healing/union.Patient is awake and alert, able to follow commands. Breathing comfortably on supplemental oxygen via [place]. She was extubated on 12/09/2014. Remains NPO. Patient still on the Cardizem drip overnight. SLP asked to evaluate swallow due to reports of difficulty swallowing pills and s/p recent extubation in setting of encephalopathy. Pt with anasarca and reports pain when attempting to move limbs due to severity of swelling.    Assessment / Plan / Recommendation Clinical Impression  [name] was assessed at bedside after return from CT abdomen. Pt requested something to drink and willing to participate in oral motor examination and po trials. Pt denies difficulty swallowing. She is edentulous, but has what appears to be two posts in her lower gums-she states, "They are just stuff" and reportedly has dentures but not present. No overt weakness noted with oral structures. Swallow initiation and laryngeal excursion appear WNL with ice chips and water. Pt does demonstrated some wheezing/audible breathing after sips water, but no coughing or wet vocal quality. Pt denies the same. She makes facial grimmace after single bite applesauce and stated she didn't want anymore, she also declined banana and mashed potato when offered to get for pt. She was agreeable to  ice cream and took 4 separate teaspoon bites without incident (again noted some mild wheezing but no coughing/wet vocal quality). Pt refused additional trials and simply repeated "I don't want anything" despite SLP encouragement. SLP explained that I needed to assess her with different textures to see if she were ready to tolerate a full tray and she stated she was ready for a tray. Intake was  extremely limited and although pt showed no overt signs/symptoms of aspiration, the wheezing after trials is concerning. Would recommend slow initiation of diet/snacks of D1/puree and thin liquids with close watch on respiratory status and D/C trials if pt demonstrates increased WOB. Pt was not receptive to increased trials today with SLP. Recommend crushing pills in puree/ice cream. SLP d/w RN and will defer diet order to MD, however SLP recommends D1/puree with thin liquids with 1:1 feeder assist and monitoring during po intake. SLP will follow in acute setting.     Aspiration Risk  Mild aspiration risk    Diet Recommendation Dysphagia 1 (Puree);Thin liquid   Liquid Administration via: Cup;Straw (SMALL sips) Medication Administration: Crushed with puree Supervision: Staff to assist with self feeding;Full supervision/cueing for compensatory strategies Compensations: Slow rate;Small sips/bites Postural Changes: Seated upright at 90 degrees;Remain upright for at least 30 minutes after po intake    Other  Recommendations Oral Care Recommendations: Oral care BID;Staff/trained caregiver to provide oral care Other Recommendations: Clarify dietary restrictions   Follow up Recommendations   (TBD)    Frequency and Duration min 2x/week  1 week       Prognosis Prognosis for Safe Diet Advancement: Fair Barriers to Reach Goals: Cognitive deficits      Swallow Study   General Date of Onset: 12/03/14 HPI: Catherine Mccullough is a 69 yo female who was admitted 12/03/2014 with severe sepsis. She was found down at home for an unknown period of time. She had intense metabolic acidemia. She was intubated 12/03/14  because of respiratory distress and hypoxia. She was eventually able to be extubated 12/09/14 and has done okay. She has had status epilepticus but no overt seizure activity recently. She has breast cancer which is metastatic to bone. She had a Mallory-Weiss tear and acute blood loss anemia. She has  improved and has been able to come off the ventilator. She does not have any respiratory problems at baseline. She is having trouble swallowing and is at risk for aspiration and a speech evaluation has been requested. She was unable to be switched to oral diltiazem because of her difficulty swallowing. CT Abdomen shows: Bilateral pleural effusion with bilateral posterior atelectasis again noted. Old appearing left lower anterior rib fractures with incomplete healing/union.Patient is awake and alert, able to follow commands. Breathing comfortably on supplemental oxygen via [place]. She was extubated on 12/09/2014. Remains NPO. Patient still on the Cardizem drip overnight. SLP asked to evaluate swallow due to reports of difficulty swallowing pills and s/p recent extubation in setting of encephalopathy. Pt with anasarca and reports pain when attempting to move limbs due to severity of swelling.  Type of Study: Bedside Swallow Evaluation Previous Swallow Assessment: None on record Diet Prior to this Study: NPO Temperature Spikes Noted: No Respiratory Status: Nasal cannula History of Recent Intubation: Yes Length of Intubations (days): 7 days Date extubated: 12/09/14 Behavior/Cognition: Alert;Cooperative;Pleasant mood (abnormal affect) Oral Cavity Assessment: Within Functional Limits (Pt with two posts in lower gums) Oral Care Completed by SLP: Recent completion by staff Oral Cavity - Dentition: Edentulous (2 posts in lower gums; has  dentures, not present) Vision: Functional for self-feeding Self-Feeding Abilities: Total assist (due to anasarca and pain with movement) Patient Positioning: Upright in bed Baseline Vocal Quality: Normal Volitional Cough: Strong Volitional Swallow: Able to elicit    Oral/Motor/Sensory Function Overall Oral Motor/Sensory Function: Within functional limits   Ice Chips Ice chips: Within functional limits Presentation: Spoon   Thin Liquid Thin Liquid: Within functional  limits Presentation: Cup;Spoon;Straw Other Comments:  (Clear vocal quality and no coughing, but some wheezing noted)    Nectar Thick Nectar Thick Liquid: Within functional limits Presentation: Straw;Spoon   Honey Thick Honey Thick Liquid: Not tested   Puree Puree:  (limited acceptance of trials) Presentation: Spoon Other Comments:  (facial grimmace due to "don't want it")   Solid Solid: Not tested      Thank you,  Genene Churn, [place] 12/11/2014,12:23 PM

## 2014-12-11 NOTE — Progress Notes (Signed)
PT Cancellation Note  Patient Details Name: Catherine Mccullough MRN: [id number] DOB: 1945/03/10   Cancelled Treatment:    Reason Eval/Treat Not Completed: Other (comment).  Pt was sleeping at the time of my arrival.  She finally awakened and was able to respond to simple questions however she was unable to participate in an evaluation.  She would not move her right hand when asked and c/o pain with flexion of the elbow and shoulder.  She was unable to move her right foot but did wiggle her toes.  She appeared to be very drowsy throughout the time I was with her.  She clearly was not interested in working with me.  Hopefully she will be more alert and able to participate tomorrow.   Demetrios Isaacs L  PT 12/11/2014, 1:29 PM [phone]

## 2014-12-11 NOTE — Progress Notes (Signed)
Subjective: She has remained off the ventilator. She had difficulty swallowing her pills. No overt seizure activity  Objective: Vital signs in last 24 hours: Temp:  [100 F (37.8 C)-100.9 F (38.3 C)] 100.3 F (37.9 C) (12/05 0600) Pulse Rate:  [55-120] 110 (12/05 0300) Resp:  [18-40] 26 (12/05 0600) BP: (90-149)/(43-126) 127/97 mmHg (12/05 0600) SpO2:  [55 %-100 %] 100 % (12/05 0300) Weight:  [59.8 kg (131 lb 13.4 oz)] 59.8 kg (131 lb 13.4 oz) (12/05 0500) Weight change: -2.3 kg (-5 lb 1.1 oz) Last BM Date: 12/10/14  Intake/Output from previous day: 12/04 0701 - 12/05 0700 In: 413.3 [I.V.:383.3; IV Piggyback:30] Out: 1700 [Urine:1700]  PHYSICAL EXAM General appearance: Sleepy but arousable Resp: rhonchi bilaterally and Right greater than left Cardio: irregularly irregular rhythm GI: soft, non-tender; bowel sounds normal; no masses,  no organomegaly Extremities: extremities normal, atraumatic, no cyanosis or edema  Lab Results:  Results for orders placed or performed during the hospital encounter of 12/03/14 (from the past 48 hour(s))  Glucose, capillary     Status: None   Collection Time: 12/09/14 11:10 AM  Result Value Ref Range   Glucose-Capillary 67 65 - 99 mg/dL  Heparin induced platelet Ab (HIT antibody)     Status: None   Collection Time: 12/09/14 11:25 AM  Result Value Ref Range   Heparin Induced Plt Ab 0.225 0.000 - 0.400 OD    Comment: (NOTE) Performed At: [facility] [place], [place] [id number] Lindon Romp MD EG:[id number]   DIC (disseminated intravasc coag) panel     Status: Abnormal   Collection Time: 12/09/14 11:25 AM  Result Value Ref Range   Prothrombin Time 16.7 (H) 11.6 - 15.2 seconds   INR 1.34 0.00 - 1.49   aPTT 45 (H) 24 - 37 seconds    Comment:        IF BASELINE aPTT IS ELEVATED, SUGGEST PATIENT RISK ASSESSMENT BE USED TO DETERMINE APPROPRIATE ANTICOAGULANT THERAPY.    Fibrinogen 795 (H) 204 - 475 mg/dL    D-Dimer, Quant 6.50 (H) 0.00 - 0.50 ug/mL-FEU    Comment: (NOTE) At the manufacturer cut-off of 0.50 ug/mL FEU, this assay has been documented to exclude PE with a sensitivity and negative predictive value of 97 to 99%.  At this time, this assay has not been approved by the FDA to exclude DVT/VTE. Results should be correlated with clinical presentation.    Platelets 27 (LL) 150 - 400 K/uL    Comment: SPECIMEN CHECKED FOR CLOTS PLATELET COUNT CONFIRMED BY SMEAR CRITICAL RESULT CALLED TO, READ BACK BY AND VERIFIED WITH: HILTON,L AT 1201 ON 12/09/2014 BY WOODS, M    Smear Review NO SCHISTOCYTES SEEN   Glucose, capillary     Status: None   Collection Time: 12/09/14  1:13 PM  Result Value Ref Range   Glucose-Capillary 83 65 - 99 mg/dL  Glucose, capillary     Status: Abnormal   Collection Time: 12/09/14  4:05 PM  Result Value Ref Range   Glucose-Capillary 61 (L) 65 - 99 mg/dL  Direct antiglobulin test (not at [facility])     Status: None   Collection Time: 12/09/14  4:26 PM  Result Value Ref Range   DAT, complement NEG Performed at [facility]     DAT, IgG NEG   Glucose, capillary     Status: Abnormal   Collection Time: 12/09/14  4:57 PM  Result Value Ref Range   Glucose-Capillary 100 (H) 65 - 99 mg/dL  Comment 1 Document in Chart   Glucose, capillary     Status: None   Collection Time: 12/09/14  7:23 PM  Result Value Ref Range   Glucose-Capillary 77 65 - 99 mg/dL  Glucose, capillary     Status: None   Collection Time: 12/10/14 12:10 AM  Result Value Ref Range   Glucose-Capillary 76 65 - 99 mg/dL  Glucose, capillary     Status: None   Collection Time: 12/10/14  3:43 AM  Result Value Ref Range   Glucose-Capillary 74 65 - 99 mg/dL  Basic metabolic panel     Status: Abnormal   Collection Time: 12/10/14  5:15 AM  Result Value Ref Range   Sodium 135 135 - 145 mmol/L   Potassium 3.9 3.5 - 5.1 mmol/L   Chloride 110 101 - 111 mmol/L   CO2 16 (L) 22 - 32 mmol/L   Glucose, Bld  168 (H) 65 - 99 mg/dL   BUN 15 6 - 20 mg/dL   Creatinine, Ser 1.12 (H) 0.44 - 1.00 mg/dL   Calcium 7.2 (L) 8.9 - 10.3 mg/dL   GFR calc non Af Amer 49 (L) >60 mL/min   GFR calc Af Amer 57 (L) >60 mL/min    Comment: (NOTE) The eGFR has been calculated using the CKD EPI equation. This calculation has not been validated in all clinical situations. eGFR's persistently <60 mL/min signify possible Chronic Kidney Disease.    Anion gap 9 5 - 15  CBC     Status: Abnormal   Collection Time: 12/10/14  5:15 AM  Result Value Ref Range   WBC 15.5 (H) 4.0 - 10.5 K/uL   RBC 3.28 (L) 3.87 - 5.11 MIL/uL   Hemoglobin 10.3 (L) 12.0 - 15.0 g/dL    Comment: DELTA CHECK NOTED RESULT REPEATED AND VERIFIED    HCT 31.0 (L) 36.0 - 46.0 %   MCV 94.5 78.0 - 100.0 fL   MCH 31.4 26.0 - 34.0 pg   MCHC 33.2 30.0 - 36.0 g/dL   RDW 22.3 (H) 11.5 - 15.5 %   Platelets 36 (L) 150 - 400 K/uL    Comment: SPECIMEN CHECKED FOR CLOTS LARGE PLATELETS PRESENT PLATELET COUNT CONFIRMED BY SMEAR   Glucose, capillary     Status: None   Collection Time: 12/10/14  7:43 AM  Result Value Ref Range   Glucose-Capillary 84 65 - 99 mg/dL  Glucose, capillary     Status: None   Collection Time: 12/10/14 11:44 AM  Result Value Ref Range   Glucose-Capillary 77 65 - 99 mg/dL  C difficile quick scan w PCR reflex     Status: None   Collection Time: 12/10/14 11:50 AM  Result Value Ref Range   C Diff antigen NEGATIVE NEGATIVE   C Diff toxin NEGATIVE NEGATIVE   C Diff interpretation Negative for toxigenic C. difficile   Glucose, capillary     Status: None   Collection Time: 12/10/14  4:20 PM  Result Value Ref Range   Glucose-Capillary 68 65 - 99 mg/dL   Comment 1 Document in Chart   Glucose, capillary     Status: Abnormal   Collection Time: 12/10/14  5:26 PM  Result Value Ref Range   Glucose-Capillary 117 (H) 65 - 99 mg/dL   Comment 1 Document in Chart   Glucose, capillary     Status: None   Collection Time: 12/10/14  7:34 PM   Result Value Ref Range   Glucose-Capillary 82 65 - 99 mg/dL  Glucose, capillary     Status: None   Collection Time: 12/10/14 11:37 PM  Result Value Ref Range   Glucose-Capillary 85 65 - 99 mg/dL  Basic metabolic panel     Status: Abnormal   Collection Time: 12/11/14  4:35 AM  Result Value Ref Range   Sodium 134 (L) 135 - 145 mmol/L   Potassium 3.4 (L) 3.5 - 5.1 mmol/L   Chloride 111 101 - 111 mmol/L   CO2 17 (L) 22 - 32 mmol/L   Glucose, Bld 153 (H) 65 - 99 mg/dL   BUN 12 6 - 20 mg/dL   Creatinine, Ser 1.05 (H) 0.44 - 1.00 mg/dL   Calcium 6.7 (L) 8.9 - 10.3 mg/dL   GFR calc non Af Amer 53 (L) >60 mL/min   GFR calc Af Amer >60 >60 mL/min    Comment: (NOTE) The eGFR has been calculated using the CKD EPI equation. This calculation has not been validated in all clinical situations. eGFR's persistently <60 mL/min signify possible Chronic Kidney Disease.    Anion gap 6 5 - 15  CBC     Status: Abnormal   Collection Time: 12/11/14  4:35 AM  Result Value Ref Range   WBC 15.5 (H) 4.0 - 10.5 K/uL   RBC 2.78 (L) 3.87 - 5.11 MIL/uL   Hemoglobin 8.9 (L) 12.0 - 15.0 g/dL   HCT 26.3 (L) 36.0 - 46.0 %   MCV 94.6 78.0 - 100.0 fL   MCH 32.0 26.0 - 34.0 pg   MCHC 33.8 30.0 - 36.0 g/dL   RDW 22.1 (H) 11.5 - 15.5 %   Platelets 57 (L) 150 - 400 K/uL    Comment: SPECIMEN CHECKED FOR CLOTS CONSISTENT WITH PREVIOUS RESULT   Glucose, capillary     Status: None   Collection Time: 12/11/14  6:19 AM  Result Value Ref Range   Glucose-Capillary 77 65 - 99 mg/dL    ABGS No results for input(s): PHART, PO2ART, TCO2, HCO3 in the last 72 hours.  Invalid input(s): PCO2 CULTURES Recent Results (from the past 240 hour(s))  Urine culture     Status: None   Collection Time: 12/03/14 11:00 AM  Result Value Ref Range Status   Specimen Description URINE, CATHETERIZED  Final   Special Requests Normal  Final   Culture   Final    NO GROWTH 1 DAY Performed at [facility]    Report Status  12/04/2014 FINAL  Final  Culture, blood (routine x 2)     Status: None   Collection Time: 12/03/14 11:00 AM  Result Value Ref Range Status   Specimen Description BLOOD RIGHT ANTECUBITAL  Final   Special Requests BOTTLES DRAWN AEROBIC AND ANAEROBIC [place]  Final   Culture NO GROWTH 6 DAYS  Final   Report Status 12/09/2014 FINAL  Final  Culture, blood (routine x 2)     Status: None   Collection Time: 12/03/14 11:20 AM  Result Value Ref Range Status   Specimen Description BLOOD RIGHT ANTECUBITAL  Final   Special Requests BOTTLES DRAWN AEROBIC AND ANAEROBIC [place]  Final   Culture NO GROWTH 6 DAYS  Final   Report Status 12/09/2014 FINAL  Final  CSF culture     Status: None   Collection Time: 12/03/14  4:29 PM  Result Value Ref Range Status   Specimen Description BACK  Final   Special Requests Immunocompromised  Final   Gram Stain   Final    CYTOSPIN SLIDE NO  WBC SEEN NO ORGANISMS SEEN CONFIRMED BY R.GREENE    Culture   Final    NO GROWTH 3 DAYS Performed at [facility]    Report Status 12/07/2014 FINAL  Final  Gram stain     Status: None   Collection Time: 12/03/14  4:29 PM  Result Value Ref Range Status   Specimen Description BACK  Final   Special Requests Immunocompromised  Final   Gram Stain NO ORGANISMS SEEN NO WBC SEEN   Final   Report Status 12/03/2014 FINAL  Final  MRSA PCR Screening     Status: Abnormal   Collection Time: 12/04/14  2:31 AM  Result Value Ref Range Status   MRSA by PCR POSITIVE (A) NEGATIVE Final    Comment:        The GeneXpert MRSA Assay (FDA approved for NASAL specimens only), is one component of a comprehensive MRSA colonization surveillance program. It is not intended to diagnose MRSA infection nor to guide or monitor treatment for MRSA infections. RESULT CALLED TO, READ BACK BY AND VERIFIED WITH: Carron Curie ON [id number] AT 0610 BY RESSEGGER R   C difficile quick scan w PCR reflex     Status: None   Collection Time:  12/10/14 11:50 AM  Result Value Ref Range Status   C Diff antigen NEGATIVE NEGATIVE Final   C Diff toxin NEGATIVE NEGATIVE Final   C Diff interpretation Negative for toxigenic C. difficile  Final   Studies/Results: Dg Abd 1 View  12/10/2014  CLINICAL DATA:  Abdominal pain EXAM: ABDOMEN - 1 VIEW COMPARISON:  None. FINDINGS: Nonobstructive bowel gas pattern. Moderate lumbar levoscoliosis with mild degenerative changes. Visualized bony pelvis appears intact. IMPRESSION: Unremarkable abdominal radiograph. Electronically Signed   By: Julian Hy M.D.   On: 12/10/2014 16:33    Medications:  Prior to Admission:  Prescriptions prior to admission  Medication Sig Dispense Refill Last Dose  . aspirin EC 81 MG tablet Take 81 mg by mouth daily.   unknown  . bisacodyl (DULCOLAX) 5 MG EC tablet Take 5 mg by mouth daily as needed for moderate constipation.   unknown  . bumetanide (BUMEX) 1 MG tablet Take 1 mg by mouth daily.    unknown at Unknown time  . calcitRIOL (ROCALTROL) 0.25 MCG capsule Take 0.25 mcg by mouth every other day.   unknown  . cetirizine (ZYRTEC) 10 MG tablet Take 5 mg by mouth daily as needed for allergies.   unknown  . diphenhydramine-acetaminophen (TYLENOL PM) 25-500 MG TABS tablet Take 1 tablet by mouth at bedtime as needed (sleep).   unknown  . docusate sodium (COLACE) 100 MG capsule Take 100 mg by mouth 2 (two) times daily.   unknown  . esomeprazole (NEXIUM) 40 MG capsule Take 40 mg by mouth daily before breakfast.     unknown  . fluticasone (FLONASE) 50 MCG/ACT nasal spray Place 1 spray into both nostrils daily as needed for allergies.    unknown  . hydrocortisone cream 1 % Apply 1 application topically daily as needed for itching.   unknown  . LYRICA 75 MG capsule Take 75 mg by mouth 3 (three) times daily.   unknown at Unknown time  . magnesium oxide (MAG-OX) 400 MG tablet Take 400 mg by mouth daily.   unknown at Unknown time  . Melatonin 3 MG CAPS Take 3 mg by mouth at  bedtime.   unknown  . metoprolol (TOPROL-XL) 200 MG 24 hr tablet Take 200 mg by mouth daily.  Take along with 50 mg tablet to equal 200 mg.   unknown at Unknown time  . metoprolol succinate (TOPROL-XL) 100 MG 24 hr tablet Take 1 tablet (100 mg total) by mouth daily. Take with or immediately following a meal. 30 tablet 0 unknown  . metoprolol succinate (TOPROL-XL) 50 MG 24 hr tablet Take 50 mg by mouth daily. Take along with 200 mg tablet to equal 250 mg daily.   unknown  . montelukast (SINGULAIR) 10 MG tablet Take 10 mg by mouth at bedtime.     unknown at Unknown time  . Multiple Vitamins-Minerals (MULTIVITAMINS THER. W/MINERALS) TABS Take 1 tablet by mouth daily.     unknown  . oxyCODONE-acetaminophen (PERCOCET) 10-325 MG per tablet Take 1 tablet by mouth every 6 (six) hours as needed for pain. 20 tablet 0 unknown  . polyethylene glycol (MIRALAX / GLYCOLAX) packet Take 17 g by mouth daily as needed (constipation).    unknown  . PROAIR HFA 108 (90 BASE) MCG/ACT inhaler Take 2 puffs by mouth every 6 (six) hours as needed.   unknown  . sertraline (ZOLOFT) 50 MG tablet Take 100 mg by mouth at bedtime.   unknown at Unknown time  . clonazePAM (KLONOPIN) 0.5 MG tablet Take 1 tablet by mouth twice daily x 30 days. (use for Klonopin) (Patient not taking: Reported on 12/04/2014) 30 tablet 0 Not Taking at Unknown time  . sodium bicarbonate 650 MG tablet Take 1 tablet (650 mg total) by mouth 2 (two) times daily. (Patient not taking: Reported on 12/04/2014)   Not Taking at Unknown time   Scheduled: . sodium chloride   Intravenous Once  . antiseptic oral rinse  7 mL Mouth Rinse BID  . diltiazem  15 mg Intravenous Once  . insulin aspart  0-9 Units Subcutaneous 6 times per day  . lacosamide (VIMPAT) IV  50 mg Intravenous Q12H  . pantoprazole (PROTONIX) IV  40 mg Intravenous Q24H  . sodium chloride  3 mL Intravenous Q12H   Continuous: . dextrose 5 % and 0.45% NaCl 50 mL/hr at 12/10/14 1800  . diltiazem  (CARDIZEM) infusion 7.5 mg/hr (12/11/14 0550)  . morphine     HWT:[id number], albuterol, morphine injection, ondansetron (ZOFRAN) IV  Assesment: She came in with severe sepsis. She was found down at home for an unknown period of time. She had intense metabolic acidemia. She was intubated because of respiratory distress and hypoxia. She was eventually able to be extubated 2 days ago and has done okay. She has had status epilepticus but no overt seizure activity recently. She has breast cancer which is metastatic to bone. She had a Mallory-Weiss tear and acute blood loss anemia. She has improved and has been able to come off the ventilator. She does not have any respiratory problems at baseline. She is having trouble swallowing and is at risk for aspiration and a speech evaluation has been requested. She was unable to be switched to oral diltiazem because of her difficulty swallowing. I think she has aspirated in the past and had what appeared to be evidence of aspiration on chest x-ray. Principal Problem:   Encephalopathy acute Active Problems:   Breast cancer metastasized to bone (HCC)   Dehydration   Thrombocytopenia (HCC)   CKD (chronic kidney disease) stage 4, GFR 15-29 ml/min (HCC)   DM type 2 (diabetes mellitus, type 2) (HCC)   Mallory-Weiss tear   Acute respiratory failure with hypoxia ([place])   Status epilepticus ([place])   Palliative care encounter  DNR (do not resuscitate) discussion   Acute blood loss anemia    Plan: Since she has made remarkable improvement in her respiratory status and is off the ventilator now I will plan to follow more peripherally. I think she will need aspiration precautions I will be glad to see her again if requested.    LOS: 8 days   Shakisha Abend L 12/11/2014, 7:38 AM

## 2014-12-11 NOTE — Progress Notes (Signed)
Hypoglycemic Event  CBG 59  Treatment: 15 GM carbohydrate snack    Symptoms: None  Follow-up CBG: Time:1800 CBG Result 51  Possible Reasons for Event: Unknown  Comments/MD notified yes IV fluids increased to 56ml/hr and diet order     Morine Kohlman, Shirlean Schlein

## 2014-12-12 LAB — COMPREHENSIVE METABOLIC PANEL
ALT: 22 U/L (ref 14–54)
AST: 19 U/L (ref 15–41)
Albumin: 2.1 g/dL — ABNORMAL LOW (ref 3.5–5.0)
Alkaline Phosphatase: 146 U/L — ABNORMAL HIGH (ref 38–126)
Anion gap: 5 (ref 5–15)
BILIRUBIN TOTAL: 0.7 mg/dL (ref 0.3–1.2)
BUN: 10 mg/dL (ref 6–20)
CO2: 16 mmol/L — ABNORMAL LOW (ref 22–32)
Calcium: 6.7 mg/dL — ABNORMAL LOW (ref 8.9–10.3)
Chloride: 117 mmol/L — ABNORMAL HIGH (ref 101–111)
Creatinine, Ser: 1.05 mg/dL — ABNORMAL HIGH (ref 0.44–1.00)
GFR calc Af Amer: >60 mL/min (ref >60)
GFR, EST NON AFRICAN AMERICAN: 53 mL/min — AB (ref >60)
GLUCOSE: 106 mg/dL — AB (ref 65–99)
Potassium: 4.6 mmol/L (ref 3.5–5.1)
Sodium: 138 mmol/L (ref 135–145)
Total Protein: 5.4 g/dL — ABNORMAL LOW (ref 6.5–8.1)

## 2014-12-12 LAB — CBC
HEMATOCRIT: 24.9 % — AB (ref 36.0–46.0)
Hemoglobin: 8.3 g/dL — ABNORMAL LOW (ref 12.0–15.0)
MCH: 32.2 pg (ref 26.0–34.0)
MCHC: 33.3 g/dL (ref 30.0–36.0)
MCV: 96.5 fL (ref 78.0–100.0)
Platelets: 81 10*3/uL — ABNORMAL LOW (ref 150–400)
RBC: 2.58 MIL/uL — ABNORMAL LOW (ref 3.87–5.11)
RDW: 22.5 % — AB (ref 11.5–15.5)
WBC: 13.7 10*3/uL — AB (ref 4.0–10.5)

## 2014-12-12 LAB — GLUCOSE, CAPILLARY
GLUCOSE-CAPILLARY: 75 mg/dL (ref 65–99)
GLUCOSE-CAPILLARY: 99 mg/dL (ref 65–99)
Glucose-Capillary: 70 mg/dL (ref 65–99)
Glucose-Capillary: 84 mg/dL (ref 65–99)
Glucose-Capillary: 92 mg/dL (ref 65–99)
Glucose-Capillary: 95 mg/dL (ref 65–99)
Glucose-Capillary: 98 mg/dL (ref 65–99)

## 2014-12-12 MED ORDER — DEXTROSE-NACL 5-0.9 % IV SOLN
INTRAVENOUS | Status: DC
  Administered 2014-12-12 (×2): via INTRAVENOUS

## 2014-12-12 MED ORDER — DILTIAZEM HCL 60 MG PO TABS
60.0000 mg | ORAL_TABLET | Freq: Three times a day (TID) | ORAL | Status: DC
  Administered 2014-12-12 – 2014-12-15 (×10): 60 mg via ORAL
  Filled 2014-12-12 (×9): qty 1

## 2014-12-12 NOTE — Progress Notes (Signed)
Nutrition Follow-up  DOCUMENTATION CODES:  Not applicable  INTERVENTION:  Worked with patient to put together meals she would accept  NUTRITION DIAGNOSIS:  Inadequate oral intake related to poor appetite as evidenced by energy intake < or equal to 50% for > or equal to 5 days.  ongoing  GOAL:  Patient will meet greater than or equal to 90% of their needs  MONITOR:  PO intake, Supplement acceptance, Diet advancement, Labs, I & O's, goals of care  ASSESSMENT:  69 y.o. female with hx of CKD, DM, HTN, Depression, Anxiety, GERD, PUD, breast cancer with bone mets and CHF who was found by her son at home after three days, unclear how long she was down. EGD eval for hemmorhagic shock. 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   Interval hx 11/28-12/1: Per GI-No ng tube until 12/1. Poor prognosis.  Interval hx: 12/1-12/6 TPN d/c 12/2,  pt was extubated on 12/3. Swallow eval 12/5-Dysphagia 1 w/ TL. Pt with very poor intake since placed on oral diet (<15%)  Pt has been documented as eating very little and refusing therapy. When RD spoke to pt she initially states there was absolutely nothing she wanted to eat and she wasn't hungry at all. She stated she had some breakfast, but it sounds like it was only bites. Through indirect conversation, was able to determine some meal preferences she wanted. Unsure if this will translate to her consuming them  Pt has not met her nutrition needs for 4 days now as the TPN was discontinued. She has refused nutritional supplements. If PO intake does not improve, unsure what direction family would like to take.   Spoke with palliative NP regarding goals of care. She is to follow up w/ POA/family and MD regarding this.  Diet Order:  DIET - DYS 1 Room service appropriate?: Yes; Fluid consistency:: Thin  Skin:  Abrasions, ecchymosis to arms, legs  Last BM:  12/4-watery  Height:  Ht Readings from Last 1 Encounters:  12/09/14 4' 7"  (1.397 m)    Weight:  Wt Readings from Last 1 Encounters:  12/11/14 131 lb 13.4 oz (59.8 kg)   Wt Readings from Last 10 Encounters:  12/11/14 131 lb 13.4 oz (59.8 kg)  09/22/14 125 lb 12.8 oz (57.063 kg)  08/22/14 138 lb 7.2 oz (62.8 kg)  03/04/14 118 lb (53.524 kg)  01/20/14 118 lb (53.524 kg)  11/03/13 106 lb (48.081 kg)  09/25/12 106 lb (48.081 kg)  05/22/12 105 lb (47.628 kg)  08/18/11 112 lb 14 oz (51.2 kg)  08/16/11 105 lb (47.628 kg)  Dosing weight:  115 lbs (52.27 kg)  Ideal Body Weight:  41.6 kg  BMI:  Body mass index using admit weight is 26.8 kg/(m^2).  Estimated Nutritional Needs:  Kcal:  1500-1700 (29-32 kcal/kg) Protein:  75-84 g (1.8-2 g/kg ibw) Fluid:  Per MD  EDUCATION NEEDS:  Education needs no appropriate at this time  Burtis Junes RD, LDN Nutrition Pager: [phone] 12/12/2014 1:38 PM

## 2014-12-12 NOTE — Progress Notes (Signed)
Patient ID: SUMAYA RIEDESEL, female   DOB: [birth year], 69 y.o.   MRN: [id number]  [place] A. [name], MD     [email or web address]          ADAMARY SAVARY is an 69 y.o. female.   Assessment/Plan: Acute severe encephalopathy of unclear etiology but associated with very high CSF protein, seizures and hypothermia. CSF analysis and other testing does not support diagnosis of infection at this time. The differential diagnosis for very high CSF protein includes bacterial related meningitis, TB meningitis, brain tumors, intracranial trauma and intracranial hemorrhage. Given the seizures, I think this is the most likely etiology however. 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  Thrombocytopenia -  Multifactorial but Keppra very rarely can cause thrombocytopenia. ( Now improved off Keppra)  Atrial fibrillation.     RECOMMENDATION: Repeat CBC.  Increase the dose of Vimpat. Imaging of the brain with IV contrast once the patient is stable. MRI is preferable. Avoid sedatives.    The patient was extubated over the weekend. She appears to be improved cognitively. She is much more alert and conversant today. She does report having some abdominal pain. She does not complain of other symptoms.  HEENT: Supple. Atraumatic normocephalic.   ABDOMEN: soft  EXTREMITIES: No edema   BACK: Normal.  SKIN: Normal by inspection.   MENTAL STATUS: She is awake and alert. She converses. She is lucid and the follows commands bilaterally.   CRANIAL NERVES: The pupils are deviated to the left and downward;  upper and lower facial muscles are normal in strength and symmetric, there is no flattening of the nasolabial folds. Cough and gag reflexes are intact. She has full extraocular  movements. No nystagmus is appreciated.  MOTOR: Bulk and tone are normal throughout.  She has antigravity strength of the upper extremities.  COORDINATION: No tremors or dysmetria is appreciated.  REFLEXES: Deep tendon reflexes are symmetrical and normal. Babinski reflexes are flexor on the right but upgoing on the left  SENSATION: She responds to deep painful stimuli bilaterally   EEG shows moderate to severe global slowing. No epileptiform activities.   Objective: Vital signs in last 24 hours: Temp:  [97.6 F (36.4 C)-100.8 F (38.2 C)] 99.5 F (37.5 C) (12/06 1500) Pulse Rate:  [85-106] 85 (12/06 1500) Resp:  [12-27] 21 (12/06 1500) BP: (101-132)/(49-63) 112/59 mmHg (12/06 1500) SpO2:  [97 %-100 %] 100 % (12/06 1500)  Intake/Output from previous day: 12/05 0701 - 12/06 0700 In: 1832.1 [P.O.:240; I.V.:1502.1; IV Piggyback:90] Out: 1500 [Urine:1500] Intake/Output this shift:   Nutritional status: DIET - DYS 1 Room service appropriate?: Yes; Fluid consistency:: Thin   Lab Results: Results for orders placed or performed during the hospital encounter of 12/03/14 (from the past 48 hour(s))  Glucose, capillary     Status: None   Collection Time: 12/10/14  7:34 PM  Result Value Ref Range   Glucose-Capillary 82 65 - 99 mg/dL  Glucose, capillary     Status: None   Collection Time: 12/10/14 11:37 PM  Result Value Ref Range   Glucose-Capillary 85 65 - 99 mg/dL  Basic metabolic panel     Status: Abnormal   Collection Time: 12/11/14  4:35 AM  Result Value Ref Range   Sodium 134 (L) 135 - 145 mmol/L   Potassium 3.4 (L) 3.5 - 5.1 mmol/L  Chloride 111 101 - 111 mmol/L   CO2 17 (L) 22 - 32 mmol/L   Glucose, Bld 153 (H) 65 - 99 mg/dL   BUN 12 6 - 20 mg/dL   Creatinine, Ser 1.05 (H) 0.44 - 1.00 mg/dL   Calcium 6.7 (L) 8.9 - 10.3 mg/dL   GFR calc non Af Amer 53 (L) >60 mL/min   GFR calc Af Amer >60 >60 mL/min    Comment: (NOTE) The eGFR has been calculated using the CKD EPI  equation. This calculation has not been validated in all clinical situations. eGFR's persistently <60 mL/min signify possible Chronic Kidney Disease.    Anion gap 6 5 - 15  CBC     Status: Abnormal   Collection Time: 12/11/14  4:35 AM  Result Value Ref Range   WBC 15.5 (H) 4.0 - 10.5 K/uL   RBC 2.78 (L) 3.87 - 5.11 MIL/uL   Hemoglobin 8.9 (L) 12.0 - 15.0 g/dL   HCT 26.3 (L) 36.0 - 46.0 %   MCV 94.6 78.0 - 100.0 fL   MCH 32.0 26.0 - 34.0 pg   MCHC 33.8 30.0 - 36.0 g/dL   RDW 22.1 (H) 11.5 - 15.5 %   Platelets 57 (L) 150 - 400 K/uL    Comment: SPECIMEN CHECKED FOR CLOTS CONSISTENT WITH PREVIOUS RESULT   Hepatic function panel     Status: Abnormal   Collection Time: 12/11/14  4:35 AM  Result Value Ref Range   Total Protein 5.2 (L) 6.5 - 8.1 g/dL   Albumin 2.1 (L) 3.5 - 5.0 g/dL   AST 25 15 - 41 U/L   ALT 25 14 - 54 U/L   Alkaline Phosphatase 145 (H) 38 - 126 U/L   Total Bilirubin 1.0 0.3 - 1.2 mg/dL   Bilirubin, Direct 0.4 0.1 - 0.5 mg/dL   Indirect Bilirubin 0.6 0.3 - 0.9 mg/dL  Lipase, blood     Status: Abnormal   Collection Time: 12/11/14  4:35 AM  Result Value Ref Range   Lipase 62 (H) 11 - 51 U/L  Glucose, capillary     Status: None   Collection Time: 12/11/14  6:19 AM  Result Value Ref Range   Glucose-Capillary 77 65 - 99 mg/dL  Glucose, capillary     Status: None   Collection Time: 12/11/14  8:17 AM  Result Value Ref Range   Glucose-Capillary 69 65 - 99 mg/dL   Comment 1 Notify RN    Comment 2 Document in Chart   Glucose, capillary     Status: None   Collection Time: 12/11/14 11:49 AM  Result Value Ref Range   Glucose-Capillary 77 65 - 99 mg/dL   Comment 1 Notify RN    Comment 2 Document in Chart   Glucose, capillary     Status: Abnormal   Collection Time: 12/11/14  4:20 PM  Result Value Ref Range   Glucose-Capillary 58 (L) 65 - 99 mg/dL  Glucose, capillary     Status: Abnormal   Collection Time: 12/11/14  5:46 PM  Result Value Ref Range    Glucose-Capillary 51 (L) 65 - 99 mg/dL  Glucose, capillary     Status: None   Collection Time: 12/11/14  7:23 PM  Result Value Ref Range   Glucose-Capillary 68 65 - 99 mg/dL   Comment 1 Document in Chart   Glucose, capillary     Status: None   Collection Time: 12/11/14 10:06 PM  Result Value Ref Range   Glucose-Capillary 71 65 -  99 mg/dL   Comment 1 Document in Chart   Glucose, capillary     Status: None   Collection Time: 12/12/14 12:14 AM  Result Value Ref Range   Glucose-Capillary 95 65 - 99 mg/dL   Comment 1 Document in Chart   Glucose, capillary     Status: None   Collection Time: 12/12/14  3:51 AM  Result Value Ref Range   Glucose-Capillary 98 65 - 99 mg/dL   Comment 1 Document in Chart   CBC     Status: Abnormal   Collection Time: 12/12/14  4:15 AM  Result Value Ref Range   WBC 13.7 (H) 4.0 - 10.5 K/uL   RBC 2.58 (L) 3.87 - 5.11 MIL/uL   Hemoglobin 8.3 (L) 12.0 - 15.0 g/dL   HCT 24.9 (L) 36.0 - 46.0 %   MCV 96.5 78.0 - 100.0 fL   MCH 32.2 26.0 - 34.0 pg   MCHC 33.3 30.0 - 36.0 g/dL   RDW 22.5 (H) 11.5 - 15.5 %   Platelets 81 (L) 150 - 400 K/uL    Comment: SPECIMEN CHECKED FOR CLOTS CONSISTENT WITH PREVIOUS RESULT   Comprehensive metabolic panel     Status: Abnormal   Collection Time: 12/12/14  4:15 AM  Result Value Ref Range   Sodium 138 135 - 145 mmol/L   Potassium 4.6 3.5 - 5.1 mmol/L    Comment: DELTA CHECK NOTED   Chloride 117 (H) 101 - 111 mmol/L   CO2 16 (L) 22 - 32 mmol/L   Glucose, Bld 106 (H) 65 - 99 mg/dL   BUN 10 6 - 20 mg/dL   Creatinine, Ser 1.05 (H) 0.44 - 1.00 mg/dL   Calcium 6.7 (L) 8.9 - 10.3 mg/dL   Total Protein 5.4 (L) 6.5 - 8.1 g/dL   Albumin 2.1 (L) 3.5 - 5.0 g/dL   AST 19 15 - 41 U/L   ALT 22 14 - 54 U/L   Alkaline Phosphatase 146 (H) 38 - 126 U/L   Total Bilirubin 0.7 0.3 - 1.2 mg/dL   GFR calc non Af Amer 53 (L) >60 mL/min   GFR calc Af Amer >60 >60 mL/min    Comment: (NOTE) The eGFR has been calculated using the CKD EPI  equation. This calculation has not been validated in all clinical situations. eGFR's persistently <60 mL/min signify possible Chronic Kidney Disease.    Anion gap 5 5 - 15  Glucose, capillary     Status: None   Collection Time: 12/12/14  7:55 AM  Result Value Ref Range   Glucose-Capillary 75 65 - 99 mg/dL   Comment 1 Notify RN    Comment 2 Document in Chart   Glucose, capillary     Status: None   Collection Time: 12/12/14 11:51 AM  Result Value Ref Range   Glucose-Capillary 99 65 - 99 mg/dL  Glucose, capillary     Status: None   Collection Time: 12/12/14  4:12 PM  Result Value Ref Range   Glucose-Capillary 84 65 - 99 mg/dL    Lipid Panel No results for input(s): CHOL, TRIG, HDL, CHOLHDL, VLDL, LDLCALC in the last 72 hours.  Studies/Results:   Medications:  Scheduled Meds: . sodium chloride   Intravenous Once  . antiseptic oral rinse  7 mL Mouth Rinse BID  . diltiazem  15 mg Intravenous Once  . diltiazem  60 mg Oral 3 times per day  . insulin aspart  0-9 Units Subcutaneous 6 times per day  . lacosamide (  VIMPAT) IV  50 mg Intravenous Q12H  . pantoprazole (PROTONIX) IV  40 mg Intravenous Q24H  . sodium chloride  3 mL Intravenous Q12H   Continuous Infusions: . dextrose 5 % and 0.9% NaCl 75 mL/hr at 12/12/14 1534   PRN Meds:.acetaminophen (TYLENOL) oral liquid 160 mg/5 mL, acetaminophen, albuterol, dextrose, morphine injection, ondansetron (ZOFRAN) IV     LOS: 9 days   Shantell Belongia A. [name], M.D.  Diplomate, Tax adviser of Psychiatry and Neurology ( Neurology).

## 2014-12-12 NOTE — Care Management Note (Addendum)
Case Management Note  Patient Details  Name: Catherine Mccullough MRN: [id number] Date of Birth: 1945/07/03  Subjective/Objective:                  Pt wants to go home at DC with [name] services through [name] (she has used them in the past). Pt has walker and BSC prior to admission. Pt does not have home O2, neb or wheelchair prior to admission.   Action/Plan: Plan for return home with [name] RN, PT, OT, aid, & SW through [name]. [name], of [name], made aware of referral and will obtain pt info from chart. Will cont to follow and provide updates of DC plan to [name] as appropriate.   Expected Discharge Date:  12/07/14               Expected Discharge Plan:  [place]  In-House Referral:  NA  Discharge planning Services  CM Consult  Post Acute Care Choice:  Home Health, Durable Medical Equipment Choice offered to:  Patient  DME Arranged:    DME Agency:     [name]:  RN, PT, Nurse's Aide, Social Work [employer] Agency:  [name]  Status of Service:  In process, will continue to follow  Medicare Important Message Given:    Date Medicare IM Given:    Medicare IM give by:    Date Additional Medicare IM Given:    Additional Medicare Important Message give by:     If discussed at [place] of Stay Meetings, dates discussed:  12/12/2014  Additional Comments:  Sherald Barge, RN 12/12/2014, 1:28 PM

## 2014-12-12 NOTE — Progress Notes (Signed)
[facility] PROGRESS NOTE  Catherine Mccullough [id number] DOB: February 04, 1945 DOA: 12/03/2014 PCP: Default, Provider, MD    Code Status: DO NOT RESUSCITATE Family Communication: Family not available Disposition Plan: Patient was extubated 2 days ago,  PT/OT needs to evaluate the patient, transfer to telemetry   Consultants:  Pulmonology, Dr. Luan Pulling  Neurology, Dr. [name]  Gastroenterology, Dr. Oneida Alar  Procedures:  Mechanical intubation  EGD  Lumbar puncture  Antibiotics:  Previously Zosyn and Zyvox, now discontinued.  HPI/Subjective: Patient is awake and alert, able to follow commands.  Patient continues to be on a Cardizem drip Patient also has a morphine drip  ordered, but not yet started Very poor oral intake  Objective: Filed Vitals:   12/12/14 0700 12/12/14 0800  BP: 116/58 120/60  Pulse: 95 92  Temp: 99 F (37.2 C) 99.1 F (37.3 C)  Resp: 16 20   oxygen saturation 100%.  Intake/Output Summary (Last 24 hours) at 12/12/14 0934 Last data filed at 12/12/14 0500  Gross per 24 hour  Intake 1783.84 ml  Output   1500 ml  Net 283.84 ml   Filed Weights   12/09/14 0509 12/10/14 0500 12/11/14 0500  Weight: 67.1 kg (147 lb 14.9 oz) 62.1 kg (136 lb 14.5 oz) 59.8 kg (131 lb 13.4 oz)    Exam:   General:  Extubated, is awake and alert. Mentating well. Follows commands  Cardiovascular: S1, S2, with mild tachycardia.  Respiratory: Coarse breath sounds with few bibasilar crackles.  Abdomen: Hypoactive bowel sounds, soft, nontender, nondistended.  Musculoskeletal/extremities: Edematous hands and arms bilaterally, no edema to lower extremities. No acute hot red joints.  Neurologic: She had a nonfocal exam.   Data Reviewed: Basic Metabolic Panel:  Recent Labs Lab 12/06/14 0426 12/07/14 0500 12/08/14 0433 12/09/14 0455 12/10/14 0515 12/11/14 0435 12/12/14 0415  NA 140 135 135 133* 135 134* 138  K 4.1 5.7* 4.9 4.5 3.9 3.4* 4.6  CL 118*  115* 114* 112* 110 111 117*  CO2 16* 15* 15* 16* 16* 17* 16*  GLUCOSE 136* 161* 131* 120* 168* 153* 106*  BUN 37* 31* 27* 21* 15 12 10   CREATININE 1.49* 1.31* 1.13* 1.08* 1.12* 1.05* 1.05*  CALCIUM 6.6* 6.7* 7.0* 6.8* 7.2* 6.7* 6.7*  MG 1.7 1.7 1.5*  --   --   --   --   PHOS <1.0* 1.7* 2.1*  --   --   --   --    Liver Function Tests:  Recent Labs Lab 12/06/14 0426 12/08/14 0433 12/11/14 0435 12/12/14 0415  AST 44* 29 25 19   ALT 22 16 25 22   ALKPHOS 114 105 145* 146*  BILITOT 1.2 1.0 1.0 0.7  PROT 4.9* 4.8* 5.2* 5.4*  ALBUMIN 2.3* 2.0* 2.1* 2.1*    Recent Labs Lab 12/11/14 0435  LIPASE 62*   No results for input(s): AMMONIA in the last 168 hours. CBC:  Recent Labs Lab 12/08/14 0433 12/09/14 0455 12/09/14 1125 12/10/14 0515 12/11/14 0435 12/12/14 0415  WBC 7.1 7.0  --  15.5* 15.5* 13.7*  HGB 8.9* 8.2*  --  10.3* 8.9* 8.3*  HCT 25.9* 23.9*  --  31.0* 26.3* 24.9*  MCV 93.8 94.5  --  94.5 94.6 96.5  PLT 24* 25* 27* 36* 57* 81*   Cardiac Enzymes:  Recent Labs Lab 12/05/14 1034  TROPONINI 0.28*   BNP (last 3 results)  Recent Labs  03/04/14 0726 08/21/14 0029  BNP 1040.0* 1557.0*    ProBNP (last 3 results) No results for  input(s): PROBNP in the last 8760 hours.  CBG:  Recent Labs Lab 12/11/14 1923 12/11/14 2206 12/12/14 0014 12/12/14 0351 12/12/14 0755  GLUCAP 68 71 95 98 75    Recent Results (from the past 240 hour(s))  Urine culture     Status: None   Collection Time: 12/03/14 11:00 AM  Result Value Ref Range Status   Specimen Description URINE, CATHETERIZED  Final   Special Requests Normal  Final   Culture   Final    NO GROWTH 1 DAY Performed at [facility]    Report Status 12/04/2014 FINAL  Final  Culture, blood (routine x 2)     Status: None   Collection Time: 12/03/14 11:00 AM  Result Value Ref Range Status   Specimen Description BLOOD RIGHT ANTECUBITAL  Final   Special Requests BOTTLES DRAWN AEROBIC AND ANAEROBIC [place]  Final   Culture NO GROWTH 6 DAYS  Final   Report Status 12/09/2014 FINAL  Final  Culture, blood (routine x 2)     Status: None   Collection Time: 12/03/14 11:20 AM  Result Value Ref Range Status   Specimen Description BLOOD RIGHT ANTECUBITAL  Final   Special Requests BOTTLES DRAWN AEROBIC AND ANAEROBIC [place]  Final   Culture NO GROWTH 6 DAYS  Final   Report Status 12/09/2014 FINAL  Final  CSF culture     Status: None   Collection Time: 12/03/14  4:29 PM  Result Value Ref Range Status   Specimen Description BACK  Final   Special Requests Immunocompromised  Final   Gram Stain   Final    CYTOSPIN SLIDE NO WBC SEEN NO ORGANISMS SEEN CONFIRMED BY R.GREENE    Culture   Final    NO GROWTH 3 DAYS Performed at [facility]    Report Status 12/07/2014 FINAL  Final  Gram stain     Status: None   Collection Time: 12/03/14  4:29 PM  Result Value Ref Range Status   Specimen Description BACK  Final   Special Requests Immunocompromised  Final   Gram Stain NO ORGANISMS SEEN NO WBC SEEN   Final   Report Status 12/03/2014 FINAL  Final  MRSA PCR Screening     Status: Abnormal   Collection Time: 12/04/14  2:31 AM  Result Value Ref Range Status   MRSA by PCR POSITIVE (A) NEGATIVE Final    Comment:        The GeneXpert MRSA Assay (FDA approved for NASAL specimens only), is one component of a comprehensive MRSA colonization surveillance program. It is not intended to diagnose MRSA infection nor to guide or monitor treatment for MRSA infections. RESULT CALLED TO, READ BACK BY AND VERIFIED WITH: Carron Curie ON [id number] AT 0610 BY RESSEGGER R   C difficile quick scan w PCR reflex     Status: None   Collection Time: 12/10/14 11:50 AM  Result Value Ref Range Status   C Diff antigen NEGATIVE NEGATIVE Final   C Diff toxin NEGATIVE NEGATIVE Final   C Diff interpretation Negative for toxigenic C. difficile  Final     Studies: Ct Abdomen Pelvis Wo Contrast  12/11/2014   CLINICAL DATA:  Diarrhea sepsis, metastatic breast cancer EXAM: CT ABDOMEN AND PELVIS WITHOUT CONTRAST TECHNIQUE: Multidetector CT imaging of the abdomen and pelvis was performed following the standard protocol without IV contrast. COMPARISON:  01/14/2011 FINDINGS: Bilateral pleural effusion with bilateral basilar posterior atelectasis again noted. There is small perihepatic ascites. Moderate perisplenic  ascites. Postsurgical changes are noted GE junction region. The patient is status postcholecystectomy. Atherosclerotic calcifications of abdominal aorta and iliac arteries. No aortic aneurysm. Mild anasarca infiltration subcutaneous wall abdomen and pelvis. Unenhanced she pancreas spleen and adrenal glands are unremarkable. Unenhanced kidneys shows no nephrolithiasis. No hydronephrosis or hydroureter. Mild fluid distended small bowel loops with some air-fluid level suspicious for ileus or enteritis. No transition point in caliber of small bowel to suggest small bowel obstruction. There is some liquid within right colon suspicious for diarrhea. Clinical correlation is necessary. The terminal ileum is unremarkable. Moderate pelvic ascites. There is a Foley catheter within decompressed urinary bladder. Small amount of air within bladder is probable post instrumentation. The patient is status post hysterectomy. Pelvic phleboliths are again noted. Mild levoscoliosis and degenerative changes lumbar spine. There is sclerosis of L2 vertebral body metastatic disease cannot be excluded. Degenerative changes pubic symphysis. Old appearing left lower anterior rib fractures with incomplete healing. There is a right femoral vein catheter with tip iliac vein. IMPRESSION: 1. Bilateral pleural effusion with bilateral posterior atelectasis again noted. Old appearing left lower anterior rib fractures with incomplete healing/union. 2. Again noted anasarca.  Status postcholecystectomy. 3. Small perihepatic ascites.  Moderate perisplenic  ascites. 4. Mild fluid distended small bowel loops with some air-fluid level suspicious for ileus or enteritis. Some fluid is noted in right colon suspicious for diarrhea. No transition point in caliber of small bowel to suggest small bowel obstruction. 5. There is a Foley catheter within decompressed urinary bladder. 6. Status post hysterectomy. 7. Degenerative changes lumbar spine. Sclerosis of L2 vertebral body. Metastatic disease cannot be excluded Electronically Signed   By: Lahoma Crocker M.D.   On: 12/11/2014 11:47   Dg Abd 1 View  12/10/2014  CLINICAL DATA:  Abdominal pain EXAM: ABDOMEN - 1 VIEW COMPARISON:  None. FINDINGS: Nonobstructive bowel gas pattern. Moderate lumbar levoscoliosis with mild degenerative changes. Visualized bony pelvis appears intact. IMPRESSION: Unremarkable abdominal radiograph. Electronically Signed   By: Julian Hy M.D.   On: 12/10/2014 16:33    Scheduled Meds: . sodium chloride   Intravenous Once  . antiseptic oral rinse  7 mL Mouth Rinse BID  . diltiazem  15 mg Intravenous Once  . insulin aspart  0-9 Units Subcutaneous 6 times per day  . lacosamide (VIMPAT) IV  50 mg Intravenous Q12H  . pantoprazole (PROTONIX) IV  40 mg Intravenous Q24H  . sodium chloride  3 mL Intravenous Q12H   Continuous Infusions: . dextrose 5 % and 0.9% NaCl    . diltiazem (CARDIZEM) infusion 7.5 mg/hr (12/11/14 2356)  . morphine     Assessment and plan: Principal Problem:   Encephalopathy acute Active Problems:   Breast cancer metastasized to bone (HCC)   Dehydration   Thrombocytopenia (HCC)   CKD (chronic kidney disease) stage 4, GFR 15-29 ml/min (HCC)   DM type 2 (diabetes mellitus, type 2) (HCC)   Mallory-Weiss tear   Acute respiratory failure with hypoxia ([place])   Status epilepticus ([place])   Palliative care encounter   DNR (do not resuscitate) discussion   Acute blood loss anemia   1. Acute encephalopathy; status epilepticus The patient was brought to the hospital  after being found her son, down forward is thought to be 3 days. Patient underwent a lumbar puncture which was not indicative of infection, but she did have an elevated CSF protein level. Neurologist, Dr. [name] was consulted. Per his assessment, her acute severe encephalopathy diagnosis is unclear, but with an  associated very high CSF protein, seizures, and hypothermia, most likely etiology was seizures. -She was eventually intubated for airway protection and started on fentanyl infusion and Versed infusion. Anticonvulsant treatment  has been changed to VIMPAT 50 mg IV q 12 hours. -S/P extubation on 12/09/2014 -No has not had seizure activity since her hospitalization.  -Plan to keep her NPO until evaluated by SLP.unable to swallow pills    Acute respiratory failure with hypoxia and hypercarbia. The patient was intubated following worsening respiratory status and for airway protection. Pulmonologist, Dr. Luan Pulling was consulted and has been assisting with ventilator management. -Due to the patient's lack of clinical improvement, the apparent plan is for weaning and extubation   and if the patient was unable to tolerate extubation or improve clinically, comfort measures were to be pursued with a morphine drip. This was discussed with the patient's family by Dr. Jerilee Hoh and Dr. Luan Pulling. Palliative care also discussed a possible terminal extubation with the family and they were in agreement with extubating the patient with no intent to reintubate her. -She was extubated on 12/09/2014 and has been stable from a respiratory standpoint on 4 L but continues to spike low-grade fever, high risk for aspiration. Started on dysphagia 1 diet. -Plan to continue monitoring her in the SDU     Questionable presumed sepsis. Infective cultures were ordered and the patient was started on broad-spectrum antibiotic treatment with Zosyn and Zyvox. There has been no obvious source of infection given the negative chest  x-ray, urinalysis, negative CSF cultures to date. Her presentation with hypothermia and hypotension were likely secondary to seizure activity and prolonged fall and state rather than infection. She remains without a fever and without a leukocytosis. -Antibiotics were discontinued on 12/08/14.   Diarrhea -On 12/10/2014 RN reporting several episodes of diarrhea in the past 24 hours.  -Labs showing elevated white of 15.5 >13.7 today, increased from 7.0. -She did receive broad spectrum antimicrobials during this hospitalization, C. Difficile negative CT abdomen pelvis without contrast on 12/5 showed bilateral pleural effusions, ascites, distended small bowel loops with air-fluid level suspicious for ileus /enteritis  Hypothermia. The patient's temperature was 52F on admission. This was thought to be secondary to status epilepticus and being found down. With supportive measures, she is now normothermic and even continues to spike low-grade fever -She was started on broad-spectrum antibiotics, but with no evidence of infection, they were discontinued.   Acute renal failure superimposed on stage III chronic kidney disease. The patient's baseline creatinine is 1.4-1.7. It was 2.5 on admission. Her creatinine has returned to baseline 1.05-.On 12/10/2014 kidney function remains stable, with Cr of 1.12 and BUN of 15.    Upper GI bleed with acute blood loss anemia. Patient underwent EGD on 11/27. She was found to have a Mallory-Weiss tear and erosive gastritis. GI placed 2 clips. -She was transfused a total of 2 units of packed red blood cells  -Her hemoglobin is drifting downward. If it falls below 8 and if she tolerates extubation, we'll transfuse more packed red blood cells. -Continue IV Protonix. Hemoglobin drop from 10.3> 8.3, continue to follow   Thrombocytopenia. Patient's platelet count was 205 on admission. It has significantly and progressively decreased over the course of the  hospitalization. It dropped to 24, now 81, improving. Her TSH and vitamin B12 were within normal. This was discussed with pharmacy who revealed that the patient never received DVT prophylaxis with Lovenox or heparin, but did receive a heparin flush. -There appears to be no  obvious bleeding at this time. Consulted hematology for thrombocytopenia  Protonix  can possibly cause thrombocytopenia. -Of note, discussed this with pharmacy who stated that Vimpat can cause thrombocytopenia. I also discussed her thrombocytopenia briefly with neurologist Dr. [name] on the phone who stated that [place] was stopped because it was a possible a contributor to thrombocytopenia and that all anticonvulsants can potentially cause thrombocytopenia.    Atrial fibrillation with RVR. The patient remains on a diltiazem drip, due to inability to swallow pills We'll titrate up accordingly as her blood pressure tolerates it for better control of her heart rate.          Type 2 diabetes mellitus. Currently stable on sliding scale NovoLog.  Nutrition. The patient was started on TPN. It was discontinued on 12/08/14. Now on dysphagia 1 diet   Time spent: 35 minutes.   [facility] Pager [phone]. If 7PM-7AM, please contact night-coverage at [email or web address], password [facility] 12/12/2014, 9:34 AM  LOS: 9 days

## 2014-12-12 NOTE — Plan of Care (Signed)
Problem: Education: Goal: Knowledge of [facility] Education information/materials will improve Outcome: Progressing Educated patient importance to eating regarding glucose blood levels.

## 2014-12-12 NOTE — Progress Notes (Signed)
Report given to [facility] on unit 300. Patient transported via bed to 336. No distress noted.

## 2014-12-12 NOTE — Progress Notes (Signed)
Labs reviewed today in the chart.  Improving thrombocytopenia without any intervention from a hematologic standpoint, which is promising.  Thrombocytopenia is likely reactive to acute illness +/- medication induced.  Will follow from the peripheral.  She will need a short-interval follow-up appointment with her primary Hem/Onc at [facility] following discharge.  She will need encouraged to follow-up appropriately as an outpatient with Hem/Onc.    Patient not seen today.  Please call hematology if needed during hospital course.  Robynn Pane, PA-C 12/12/2014 3:57 PM

## 2014-12-12 NOTE — Progress Notes (Signed)
Attempt to feed patient breakfast patient consumed 3 spoonful of oatmeal and orange juice. Patient stated she did not want anything. Offered patient Jeanne Ivan and Glucerna patient refused.

## 2014-12-12 NOTE — Clinical Social Work Note (Signed)
Clinical Social Work Assessment  Patient Details  Name: Catherine Mccullough MRN: [id number] Date of Birth: [birth year]  Date of referral:  12/12/14               Reason for consult:  Facility Placement                Permission sought to share information with:    Permission granted to share information::     Name::        Agency::     Relationship::     Contact Information:     Housing/Transportation Living arrangements for the past 2 months:  Single Family Home Source of Information:    Patient Interpreter Needed:  None Criminal Activity/Legal Involvement Pertinent to Current Situation/Hospitalization:  No - Comment as needed Significant Relationships:  Adult Children Lives with:  Self Do you feel safe going back to the place where you live?  Yes Need for family participation in patient care:  Yes (Comment)  Care giving concerns:  Patient currently lives alone. She says that she adequately cares for herself.  Patient is deconditioned currently.   Social Worker assessment / plan:  Patient indicated that she lives alone.  She stated that she ambulates with a walker. Patient reported that she completes ADLs unassisted. She indicated that her children assists her when she needs them.  Patient stated that she does not want to go to a SNF for rehab.  CSW discussed the need for patient to attempt to regain her strength through rehab at a SNF. Patient stated "I can gain my strength back, nope." Patient declined to accept SNF list from [place].   Employment status:  Retired Nurse, adult PT Recommendations:   (Patient refused to work with PT on today. PT will attempt again tomorrow. ) Information / Referral to community resources:   (Patient refused to accept a SNF list, stating that she was not going to go. )  Patient/Family's Response to care:  Patient is not agreeable to go to SNF.  Patient/Family's Understanding of and Emotional Response to Diagnosis, Current  Treatment, and Prognosis: It does not appear that patient is realistic with regarding her diagnosis, treatment and prognosis.   Emotional Assessment Appearance:  Appears stated age Attitude/Demeanor/Rapport:   (Tired) Affect (typically observed):  Frustrated Orientation:  Oriented to Self, Oriented to Place Alcohol / Substance use:  Not Applicable Psych involvement (Current and /or in the community):  No (Comment)  Discharge Needs  Concerns to be addressed:  Discharge Planning Concerns Readmission within the last 30 days:  No Current discharge risk:  Chronically ill, Lives alone Barriers to Discharge:  No Barriers Identified   Ihor Gully, LCSW 12/12/2014, 12:44 PM [phone]

## 2014-12-12 NOTE — Progress Notes (Signed)
Speech Language Pathology Treatment: Dysphagia  Patient Details Name: Catherine Mccullough MRN: [id number] DOB: 1945/10/16 Today's Date: 12/12/2014 Time: [id number] SLP Time Calculation (min) (ACUTE ONLY): 20 min  Assessment / Plan / Recommendation Clinical Impression  SLP attempted to see Ms. Catherine Mccullough for po trials for diet tolerance and possible diet upgrade, however pt adamantly refused to eat or drink anything. SLP explained the reason for my visit and rationale for assessing "chewable" textures in order to upgrade her diet from puree. Pt stated several times, "I don't want anything". SLP attempted to gain food preferences and pt repeated, "I don't want anything... I don't like the puree... I won't eat it... I will only eat the broth and ice cream". She stated that she could not eat any of the items I brought her (donut, crackers, ice cream, cereal). I was able to gather from her that she does like baked chicken, green beans, and spaghetti but that she did not want any of those items while she was "in this hospital". Unable to assess pt with soft textures to determine safety with intake. Pt edentulous, but has two pegs in gums. SLP will try one more visit tomorrow, otherwise will sign off.    HPI HPI: Catherine Mccullough is a 69 yo female who was admitted 12/03/2014 with severe sepsis. She was found down at home for an unknown period of time. She had intense metabolic acidemia. She was intubated 12/03/14  because of respiratory distress and hypoxia. She was eventually able to be extubated 12/09/14 and has done okay. She has had status epilepticus but no overt seizure activity recently. She has breast cancer which is metastatic to bone. She had a Mallory-Weiss tear and acute blood loss anemia. She has improved and has been able to come off the ventilator. She does not have any respiratory problems at baseline. She is having trouble swallowing and is at risk for aspiration and a speech evaluation has been  requested. She was unable to be switched to oral diltiazem because of her difficulty swallowing. CT Abdomen shows: Bilateral pleural effusion with bilateral posterior atelectasis again noted. Old appearing left lower anterior rib fractures with incomplete healing/union.Patient is awake and alert, able to follow commands. Breathing comfortably on supplemental oxygen via . She was extubated on 12/09/2014. Remains NPO. Patient still on the Cardizem drip overnight. SLP asked to evaluate swallow due to reports of difficulty swallowing pills and s/p recent extubation in setting of encephalopathy. Pt with anasarca and reports pain when attempting to move limbs due to severity of swelling.       SLP Plan  Continue with current plan of care     Recommendations  Diet recommendations: Dysphagia 1 (puree);Thin liquid Liquids provided via: Cup;Straw Medication Administration: Crushed with puree Supervision: Staff to assist with self feeding Compensations: Slow rate;Small sips/bites Postural Changes and/or Swallow Maneuvers: Seated upright 90 degrees;Upright 30-60 min after meal              Oral Care Recommendations: Oral care BID;Staff/trained caregiver to provide oral care Follow up Recommendations: 24 hour supervision/assistance Plan: Continue with current plan of care  Thank you,  Genene Churn, [place]  [place] 12/12/2014, 7:11 PM

## 2014-12-12 NOTE — Progress Notes (Signed)
PT Cancellation Note  Patient Details Name: Catherine Mccullough MRN: [id number] DOB: 11-18-1945   Cancelled Treatment:    Reason Eval/Treat Not Completed: Patient declined, no reason specified.  Pt is alert and communicating.  She adamantly refuses to work with me in any capacity.  She states that she is going home at d/c and will have people in ([place]) to assist.  I asked her if she planned to have someone come and stay with her she said "no".  I tried to convince her to do just a little bed exercise but she flatly refuses.  She states that she wants to walk.  I offered to get her up walking but she refuses to use anything but her 4 wheeled walker at home.  There is no convincing her to do anything but lie inertly in the bed.  I will try again tomorrow but if she refuses again we will have to take her off service.   Demetrios Isaacs L  PT 12/12/2014, 11:35 AM [phone]

## 2014-12-13 DIAGNOSIS — E872 Acidosis, unspecified: Secondary | ICD-10-CM

## 2014-12-13 DIAGNOSIS — D62 Acute posthemorrhagic anemia: Secondary | ICD-10-CM

## 2014-12-13 DIAGNOSIS — I5021 Acute systolic (congestive) heart failure: Secondary | ICD-10-CM | POA: Diagnosis present

## 2014-12-13 DIAGNOSIS — D696 Thrombocytopenia, unspecified: Secondary | ICD-10-CM

## 2014-12-13 DIAGNOSIS — R197 Diarrhea, unspecified: Secondary | ICD-10-CM

## 2014-12-13 HISTORY — DX: Acidosis, unspecified: E87.20

## 2014-12-13 HISTORY — DX: Acute systolic (congestive) heart failure: I50.21

## 2014-12-13 HISTORY — DX: Diarrhea, unspecified: R19.7

## 2014-12-13 HISTORY — DX: Acidosis: E87.2

## 2014-12-13 LAB — COMPREHENSIVE METABOLIC PANEL
ALBUMIN: 2.1 g/dL — AB (ref 3.5–5.0)
ALT: 18 U/L (ref 14–54)
ANION GAP: 4 — AB (ref 5–15)
AST: 17 U/L (ref 15–41)
Alkaline Phosphatase: 161 U/L — ABNORMAL HIGH (ref 38–126)
BILIRUBIN TOTAL: 0.8 mg/dL (ref 0.3–1.2)
BUN: 9 mg/dL (ref 6–20)
CO2: 17 mmol/L — ABNORMAL LOW (ref 22–32)
Calcium: 6.5 mg/dL — ABNORMAL LOW (ref 8.9–10.3)
Chloride: 117 mmol/L — ABNORMAL HIGH (ref 101–111)
Creatinine, Ser: 0.94 mg/dL (ref 0.44–1.00)
GFR calc Af Amer: >60 mL/min (ref >60)
GLUCOSE: 82 mg/dL (ref 65–99)
POTASSIUM: 4.2 mmol/L (ref 3.5–5.1)
Sodium: 138 mmol/L (ref 135–145)
TOTAL PROTEIN: 5.3 g/dL — AB (ref 6.5–8.1)

## 2014-12-13 LAB — CBC
HEMATOCRIT: 23 % — AB (ref 36.0–46.0)
HEMOGLOBIN: 7.5 g/dL — AB (ref 12.0–15.0)
MCH: 31.6 pg (ref 26.0–34.0)
MCHC: 32.6 g/dL (ref 30.0–36.0)
MCV: 97 fL (ref 78.0–100.0)
Platelets: 140 10*3/uL — ABNORMAL LOW (ref 150–400)
RBC: 2.37 MIL/uL — ABNORMAL LOW (ref 3.87–5.11)
RDW: 22.4 % — ABNORMAL HIGH (ref 11.5–15.5)
WBC: 13.7 10*3/uL — ABNORMAL HIGH (ref 4.0–10.5)

## 2014-12-13 LAB — GLUCOSE, CAPILLARY
GLUCOSE-CAPILLARY: 65 mg/dL (ref 65–99)
Glucose-Capillary: 90 mg/dL (ref 65–99)
Glucose-Capillary: 72 mg/dL (ref 65–99)
Glucose-Capillary: 61 mg/dL — ABNORMAL LOW (ref 65–99)
Glucose-Capillary: 75 mg/dL (ref 65–99)
Glucose-Capillary: 59 mg/dL — ABNORMAL LOW (ref 65–99)
Glucose-Capillary: 65 mg/dL (ref 65–99)

## 2014-12-13 MED ORDER — BUMETANIDE 0.25 MG/ML IJ SOLN
0.5000 mg | Freq: Two times a day (BID) | INTRAMUSCULAR | Status: DC
  Administered 2014-12-13 – 2014-12-14 (×3): 0.5 mg via INTRAVENOUS
  Filled 2014-12-13 (×3): qty 4

## 2014-12-13 MED ORDER — PANTOPRAZOLE SODIUM 40 MG PO TBEC
40.0000 mg | DELAYED_RELEASE_TABLET | Freq: Every day | ORAL | Status: DC
  Administered 2014-12-14 – 2014-12-15 (×2): 40 mg via ORAL
  Filled 2014-12-13 (×2): qty 1

## 2014-12-13 MED ORDER — CIPROFLOXACIN HCL 250 MG PO TABS
500.0000 mg | ORAL_TABLET | Freq: Two times a day (BID) | ORAL | Status: DC
  Administered 2014-12-13 – 2014-12-16 (×7): 500 mg via ORAL
  Filled 2014-12-13 (×8): qty 2

## 2014-12-13 MED ORDER — METRONIDAZOLE 500 MG PO TABS
500.0000 mg | ORAL_TABLET | Freq: Three times a day (TID) | ORAL | Status: DC
  Administered 2014-12-13 – 2014-12-16 (×9): 500 mg via ORAL
  Filled 2014-12-13 (×9): qty 1

## 2014-12-13 MED ORDER — SODIUM BICARBONATE 8.4 % IV SOLN
INTRAVENOUS | Status: DC
  Administered 2014-12-13 – 2014-12-15 (×3): via INTRAVENOUS
  Filled 2014-12-13 (×9): qty 1000

## 2014-12-13 MED ORDER — METOPROLOL SUCCINATE ER 50 MG PO TB24
50.0000 mg | ORAL_TABLET | Freq: Every day | ORAL | Status: DC
  Administered 2014-12-13 – 2014-12-18 (×6): 50 mg via ORAL
  Filled 2014-12-13 (×6): qty 1

## 2014-12-13 NOTE — Progress Notes (Signed)
[facility] PROGRESS NOTE  Catherine Mccullough [id number] DOB: [birth year] DOA: 12/03/2014 PCP: Default, Provider, MD    Code Status: DO NOT RESUSCITATE Family Communication: Discussed briefly with patient's father Disposition Plan: Discharge to home when clinically appropriate, likely in the next few days.   Consultants:  Pulmonology, Dr. Luan Pulling  Neurology, Dr. [name]  Gastroenterology, Dr. Oneida Alar  Procedures:  Intubation/mechanical ventilation. Extubation 12/09/14  EGD  Lumbar puncture  2-D echo on 12/06/14: Impressions: - Normal LV wall thickness with LVEF approximately 25% associated with wall motion abnormalities as outlined above suggesting possible ischemic cardiomyopathy. There is grade 1 diastolic dysfunction. Compared to the previous study in August 2016 there has been significant reduction in LVEF. MAC with mildly thickened mitral leaflets. Sclerotic aortic valve without stenosis. Mild RV dilatation and decreased contraction. Moderate right atrial enlargement. Moderate tricuspid regurgitation with PASP 35 mmHg. Difficult to assess degree of pulmonic regurgitation, but appears to be at least moderate.  Antibiotics:  Cipro 12/13/14  Flagyl 12/13/14  Previously treated with Zosyn and Zyvox initially; discontinued.  HPI/Subjective: Patient reports no diarrhea, but she is sitting in a pool of stool. She ate knowledge is some epigastric abdominal pain, but she downplays the extent of it. She has mild nausea but no vomiting. She ate less than half of her lunch. She denies chest pain.  Objective: Filed Vitals:   12/12/14 2010 12/13/14 0418  BP: 135/65 141/38  Pulse: 101 104  Temp: 98.4 F (36.9 C) 98.2 F (36.8 C)  Resp: 20 20   oxygen saturation 100% on room air.  Intake/Output Summary (Last 24 hours) at 12/13/14 1220 Last data filed at 12/13/14 0900  Gross per 24 hour  Intake  507.5 ml  Output   1100 ml  Net -592.5 ml    Filed Weights   12/09/14 0509 12/10/14 0500 12/11/14 0500  Weight: 67.1 kg (147 lb 14.9 oz) 62.1 kg (136 lb 14.5 oz) 59.8 kg (131 lb 13.4 oz)    Exam:   General:  Alert 69 year old African-American woman who appears debilitated, but in no acute distress.  Cardiovascular: Irregular, irregular, with a soft systolic murmur.  Respiratory: Decreased breath sounds in the bases, clear anteriorly. Breathing nonlabored.  Abdomen: protuberant, positive bowel sounds, mildly tender in the epigastrium, mild distention.  Musculoskeletal/extremities: No acute hot red joints. Trace pedal edema bilaterally.   Data Reviewed: Basic Metabolic Panel:  Recent Labs Lab 12/07/14 0500 12/08/14 0433 12/09/14 0455 12/10/14 0515 12/11/14 0435 12/12/14 0415 12/13/14 0510  NA 135 135 133* 135 134* 138 138  K 5.7* 4.9 4.5 3.9 3.4* 4.6 4.2  CL 115* 114* 112* 110 111 117* 117*  CO2 15* 15* 16* 16* 17* 16* 17*  GLUCOSE 161* 131* 120* 168* 153* 106* 82  BUN 31* 27* 21* 15 12 10 9   CREATININE 1.31* 1.13* 1.08* 1.12* 1.05* 1.05* 0.94  CALCIUM 6.7* 7.0* 6.8* 7.2* 6.7* 6.7* 6.5*  MG 1.7 1.5*  --   --   --   --   --   PHOS 1.7* 2.1*  --   --   --   --   --    Liver Function Tests:  Recent Labs Lab 12/08/14 0433 12/11/14 0435 12/12/14 0415 12/13/14 0510  AST 29 25 19 17   ALT 16 25 22 18   ALKPHOS 105 145* 146* 161*  BILITOT 1.0 1.0 0.7 0.8  PROT 4.8* 5.2* 5.4* 5.3*  ALBUMIN 2.0* 2.1* 2.1* 2.1*    Recent Labs Lab 12/11/14 0435  LIPASE  62*   No results for input(s): AMMONIA in the last 168 hours. CBC:  Recent Labs Lab 12/09/14 0455 12/09/14 1125 12/10/14 0515 12/11/14 0435 12/12/14 0415 12/13/14 0510  WBC 7.0  --  15.5* 15.5* 13.7* 13.7*  HGB 8.2*  --  10.3* 8.9* 8.3* 7.5*  HCT 23.9*  --  31.0* 26.3* 24.9* 23.0*  MCV 94.5  --  94.5 94.6 96.5 97.0  PLT 25* 27* 36* 57* 81* 140*   Cardiac Enzymes: No results for input(s): CKTOTAL, CKMB, CKMBINDEX, TROPONINI in the last 168  hours. BNP (last 3 results)  Recent Labs  03/04/14 0726 08/21/14 0029  BNP 1040.0* 1557.0*    ProBNP (last 3 results) No results for input(s): PROBNP in the last 8760 hours.  CBG:  Recent Labs Lab 12/12/14 2343 12/13/14 0410 12/13/14 0723 12/13/14 1130 12/13/14 1216  GLUCAP 92 90 72 61* 75    Recent Results (from the past 240 hour(s))  CSF culture     Status: None   Collection Time: 12/03/14  4:29 PM  Result Value Ref Range Status   Specimen Description BACK  Final   Special Requests Immunocompromised  Final   Gram Stain   Final    CYTOSPIN SLIDE NO WBC SEEN NO ORGANISMS SEEN CONFIRMED BY [id number]    Culture   Final    NO GROWTH 3 DAYS Performed at [facility]    Report Status 12/07/2014 FINAL  Final  Gram stain     Status: None   Collection Time: 12/03/14  4:29 PM  Result Value Ref Range Status   Specimen Description BACK  Final   Special Requests Immunocompromised  Final   Gram Stain NO ORGANISMS SEEN NO WBC SEEN   Final   Report Status 12/03/2014 FINAL  Final  MRSA PCR Screening     Status: Abnormal   Collection Time: 12/04/14  2:31 AM  Result Value Ref Range Status   MRSA by PCR POSITIVE (A) NEGATIVE Final    Comment:        The GeneXpert MRSA Assay (FDA approved for NASAL specimens only), is one component of a comprehensive MRSA colonization surveillance program. It is not intended to diagnose MRSA infection nor to guide or monitor treatment for MRSA infections. RESULT CALLED TO, READ BACK BY AND VERIFIED WITH: Carron Curie ON [id number] AT 0610 BY RESSEGGER R   C difficile quick scan w PCR reflex     Status: None   Collection Time: 12/10/14 11:50 AM  Result Value Ref Range Status   C Diff antigen NEGATIVE NEGATIVE Final   C Diff toxin NEGATIVE NEGATIVE Final   C Diff interpretation Negative for toxigenic C. difficile  Final     Studies: No results found.  Scheduled Meds: . sodium chloride   Intravenous Once  . antiseptic oral  rinse  7 mL Mouth Rinse BID  . diltiazem  15 mg Intravenous Once  . diltiazem  60 mg Oral 3 times per day  . insulin aspart  0-9 Units Subcutaneous 6 times per day  . lacosamide (VIMPAT) IV  50 mg Intravenous Q12H  . [START ON 12/14/2014] pantoprazole  40 mg Oral Daily  . sodium chloride  3 mL Intravenous Q12H   Continuous Infusions: . dextrose 5 % and 0.9% NaCl 75 mL/hr at 12/12/14 2325    assessment and plan:  Principal Problem:   Encephalopathy acute Active Problems:   Acute respiratory failure with hypoxia [facility])   Status epilepticus (HCC)   Breast cancer  metastasized to bone (HCC)   Dehydration   Thrombocytopenia (HCC)   CKD (chronic kidney disease) stage 4, GFR 15-29 ml/min (HCC)   DM type 2 (diabetes mellitus, type 2) ([place])   Mallory-Weiss tear   Palliative care encounter   DNR (do not resuscitate) discussion   Acute blood loss anemia   Diarrhea   Metabolic acidosis    1. Acute encephalopathy; status epilepticus The patient was brought to the hospital after being found her son, down forward is thought to be 3 days. Patient underwent a lumbar puncture which was not indicative of infection, but she did have an elevated CSF protein level. Neurologist, Dr. [name] was consulted. Per his assessment, her acute severe encephalopathy diagnosis was unclear, but with an associated very high CSF protein and hypothermia, most likely etiology was seizures/status epilepticus. -She was eventually intubated for airway protection and started on fentanyl infusion and Versed infusion. Anticonvulsant treatment was changed from [place] to VIMPAT 50 mg IV q 12 hours. -She was excessively extubated on 12/09/2014. -She has not had an active known seizure since hospitalization. -Her encephalopathy has resolved.  Acute respiratory failure with hypoxia and hypercarbia. The patient was intubated following worsening respiratory status and for airway protection. Pulmonologist, Dr. Luan Pulling was  consulted and assisted with ventilator management. -Due to the patient's lack of clinical improvement, the apparent plan is for weaning and extubationand if the patient was unable to tolerate extubation or improve clinically, comfort measures were to be pursued with a morphine drip. This was discussed with the patient's family by Dr. Jerilee Hoh and Dr. Luan Pulling. Palliative care also discussed a possible terminal extubation with the family and they were in agreement with extubating the patient with no intent to reintubate her. -She was extubated on 12/09/2014. Her respiratory status has improved and is stable. However, she does have bilateral pleural effusions which may be secondary to volume overload from volume resuscitation and packed red blood cell transfusions.  Acute systolic congestive heart failure. Following admission, 2-D echocardiogram was ordered and revealed an EF of 25% which was significantly lower than 55-65% compared to the previous echocardiogram. The patient was intubated for airway protection, but because of her intubation and presumed poor prognosis, cardiac workup was postponed. Patient is treated with Bumex chronically. Abdominal CT revealed some ascites and pleural effusions which are likely from volume overload and lower EF. -We will restart Bumex and give IV. Will decrease IV fluids on. -We will resume metoprolol and aspirin. If her blood pressure tolerates it, we'll consider starting ACE inhibitor on 12/14/14. -We will likely defer cardiology workup to the outpatient setting and when the patient is not as deconditioned.   Atrial fibrillation with RVR. The patient developed paroxysmal atrial fibrillation with RVR. She was started on a diltiazem drip when she was intubated. Now that she is extubated, the diltiazem drip was titrated off. She was started on every 8 hour dosing of by mouth diltiazem. Her heart rate has improved, but is intermittently elevated. Metoprolol XL will be  restarted for better blood pressure control and heart rate.  Questionable presumed sepsis. Infective cultures were ordered and the patient was started on broad-spectrum antibiotic treatment with Zosyn and Zyvox. There was no obvious source of infection given the negative chest x-ray, urinalysis, negative CSF cultures to date following admission. Her presentation with hypothermia and hypotension were likely secondary to seizure activity and prolonged fall and state rather than infection. -Antibiotics were discontinued on 12/08/14.   Diarrhea/? Enteritis.  -On 12/10/2014, the patient  developed several episodes of diarrhea or loose stools.  -Follow-up laboratory studies revealed that her white blood cell count had increased to greater than 15,000. She did receive broad-spectrum antibiotics initially.  -C. difficile PCR was negative.  -CT abdomen/pelvis without contrast on 12/5 showed bilateral pleural effusions, ascites, distended small bowel loops with air-fluid level suspicious for ileus /enteritis -Because of the ongoing loose stools, will order a GI pathogen panel and start Cipro and Flagyl empirically for enteritis.  Metabolic acidosis. The patient's CO2 has progressively decreased. This may be secondary to bicarbonate losses from her loose stools. -IV fluids were changed to D5 with 2 amps of bicarbonate for repletion. -We'll continue to monitor.  Hypothermia. The patient's temperature was 26F on admission. This was thought to be secondary to status epilepticus and being found down. With supportive measures, she is now normothermic and even continues to spike low-grade fever -She was started on broad-spectrum antibiotics, but with no evidence of infection, they were discontinued.  Acute renal failure superimposed on stage III chronic kidney disease. The patient's baseline creatinine is 1.4-1.7. It was 2.5 on admission. Her creatinine has returned to baseline 1.05. Likely etiology was  prerenal azotemia in the setting of sepsis.    Upper GI bleed with acute blood loss anemia. Patient underwent EGD on 11/27. She was found to have a Mallory-Weiss tear and erosive gastritis. GI placed 2 clips. -She was transfused a total of 2 units of packed red blood cells  -Her hemoglobin is drifting downward. It has fallen to 7.5 to maybe in part hemodilution.  -We'll continue to follow her CBC and consider packed red blood cell transfusion again. -We'll continue PPI started.   Thrombocytopenia. Patient's platelet count was 205 on admission. It has significantly and progressively decreased over the course of the hospitalization. It dropped to a nadir of 24 Her TSH and vitamin B12 were within normal. according to pharmacy,  the patient never received DVT prophylaxis with Lovenox or heparin, but did receive a heparin flush. Consulted hematology for thrombocytopenia. Hematology felt that the thrombocytopenia was likely multifactorial including reactive due to acute illness/infection, malignancy from breast cancer, hemodilution, consumption from GI bleed, and medication induced. Patient apparently has history of thrombocytopenia dating back to 2013 with anemia.  Protonix can possibly cause thrombocytopenia. Keppra can cause thrombocytopenia and it was discontinued in favor of Vimpat  -Patient's platelet count has progressively improved.  -We will refer her to outpatient hematology.    Type 2 diabetes mellitus. Currently stable on sliding scale NovoLog.  Nutrition. The patient was started on TPN. It was discontinued on 12/08/14. Now on dysphagia 1 diet, as recommended by speech therapist for some dysphasia following expiration.    Time spent: 35 minutes.     [facility] Pager [phone]  If 7PM-7AM, please contact night-coverage at [email or web address], password [facility] 12/13/2014, 12:20 PM  LOS: 10 days

## 2014-12-13 NOTE — Progress Notes (Signed)
Patient ID: Catherine Mccullough, female   DOB: October 01, 1945, 69 y.o.   MRN: [id number]  [place] A. [name], MD     [email or web address]          Catherine Mccullough is an 69 y.o. female.   Assessment/Plan: Acute severe encephalopathy of unclear etiology but associated with very high CSF protein, seizures and hypothermia. CSF analysis and other testing does not support diagnosis of infection at this time. The differential diagnosis for very high CSF protein includes bacterial related meningitis, TB meningitis, brain tumors, intracranial trauma and intracranial hemorrhage. Given the seizures, I think this is the most likely etiology however. 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  Thrombocytopenia -  Multifactorial but Keppra very rarely can cause thrombocytopenia. ( Now improved off Keppra)  Atrial fibrillation.     RECOMMENDATION: Repeat CBC.  Increase the dose of Vimpat. Imaging of the brain with IV contrast once the patient is stable. MRI is preferable. Avoid sedatives.   She continues to improve and continues. A lot responsibility lucid. She does not report having a lot of abdominal pain today. There is no headache. She continues to have some leg weakness but this seems improved.   HEENT: Supple. Atraumatic normocephalic.   ABDOMEN: soft  EXTREMITIES: No edema   BACK: Normal.  SKIN: Normal by inspection.   MENTAL STATUS: She is awake and alert. She converses. She is lucid and the follows commands bilaterally.   CRANIAL NERVES: The pupils are deviated to the left and downward;  upper and lower facial muscles are normal in strength and symmetric, there is no flattening of the nasolabial folds. Cough and gag reflexes are intact. She has full extraocular  movements. No nystagmus is appreciated.  MOTOR: Bulk and tone are normal throughout.  She has antigravity strength of the upper extremities and legs.    COORDINATION: No tremors or dysmetria is appreciated.  REFLEXES: Deep tendon reflexes are symmetrical and normal. Babinski reflexes are flexor on the right but upgoing on the left  SENSATION: She responds to deep painful stimuli bilaterally   EEG shows moderate to severe global slowing. No epileptiform activities.   Objective: Vital signs in last 24 hours: Temp:  [98.2 F (36.8 C)-98.4 F (36.9 C)] 98.2 F (36.8 C) (12/07 0418) Pulse Rate:  [101-104] 104 (12/07 0418) Resp:  [20] 20 (12/07 0418) BP: (135-141)/(38-65) 141/38 mmHg (12/07 0418) SpO2:  [10 %-100 %] 100 % (12/07 0418)  Intake/Output from previous day: 12/06 0701 - 12/07 0700 In: 762.5 [P.O.:320; I.V.:412.5; IV Piggyback:30] Out: 1100 [Urine:1100] Intake/Output this shift: Total I/O In: 120 [P.O.:120] Out: -  Nutritional status: DIET - DYS 1 Room service appropriate?: Yes; Fluid consistency:: Thin   Lab Results: Results for orders placed or performed during the hospital encounter of 12/03/14 (from the past 48 hour(s))  Glucose, capillary     Status: None   Collection Time: 12/11/14  7:23 PM  Result Value Ref Range   Glucose-Capillary 68 65 - 99 mg/dL   Comment 1 Document in Chart   Glucose, capillary     Status: None   Collection Time: 12/11/14 10:06 PM  Result Value Ref Range   Glucose-Capillary 71 65 - 99 mg/dL   Comment 1 Document in Chart   Glucose, capillary     Status: None   Collection Time: 12/12/14  12:14 AM  Result Value Ref Range   Glucose-Capillary 95 65 - 99 mg/dL   Comment 1 Document in Chart   Glucose, capillary     Status: None   Collection Time: 12/12/14  3:51 AM  Result Value Ref Range   Glucose-Capillary 98 65 - 99 mg/dL   Comment 1 Document in Chart   CBC     Status: Abnormal   Collection Time: 12/12/14  4:15 AM  Result Value  Ref Range   WBC 13.7 (H) 4.0 - 10.5 K/uL   RBC 2.58 (L) 3.87 - 5.11 MIL/uL   Hemoglobin 8.3 (L) 12.0 - 15.0 g/dL   HCT 24.9 (L) 36.0 - 46.0 %   MCV 96.5 78.0 - 100.0 fL   MCH 32.2 26.0 - 34.0 pg   MCHC 33.3 30.0 - 36.0 g/dL   RDW 22.5 (H) 11.5 - 15.5 %   Platelets 81 (L) 150 - 400 K/uL    Comment: SPECIMEN CHECKED FOR CLOTS CONSISTENT WITH PREVIOUS RESULT   Comprehensive metabolic panel     Status: Abnormal   Collection Time: 12/12/14  4:15 AM  Result Value Ref Range   Sodium 138 135 - 145 mmol/L   Potassium 4.6 3.5 - 5.1 mmol/L    Comment: DELTA CHECK NOTED   Chloride 117 (H) 101 - 111 mmol/L   CO2 16 (L) 22 - 32 mmol/L   Glucose, Bld 106 (H) 65 - 99 mg/dL   BUN 10 6 - 20 mg/dL   Creatinine, Ser 1.05 (H) 0.44 - 1.00 mg/dL   Calcium 6.7 (L) 8.9 - 10.3 mg/dL   Total Protein 5.4 (L) 6.5 - 8.1 g/dL   Albumin 2.1 (L) 3.5 - 5.0 g/dL   AST 19 15 - 41 U/L   ALT 22 14 - 54 U/L   Alkaline Phosphatase 146 (H) 38 - 126 U/L   Total Bilirubin 0.7 0.3 - 1.2 mg/dL   GFR calc non Af Amer 53 (L) >60 mL/min   GFR calc Af Amer >60 >60 mL/min    Comment: (NOTE) The eGFR has been calculated using the CKD EPI equation. This calculation has not been validated in all clinical situations. eGFR's persistently <60 mL/min signify possible Chronic Kidney Disease.    Anion gap 5 5 - 15  Glucose, capillary     Status: None   Collection Time: 12/12/14  7:55 AM  Result Value Ref Range   Glucose-Capillary 75 65 - 99 mg/dL   Comment 1 Notify RN    Comment 2 Document in Chart   Glucose, capillary     Status: None   Collection Time: 12/12/14 11:51 AM  Result Value Ref Range   Glucose-Capillary 99 65 - 99 mg/dL  Glucose, capillary     Status: None   Collection Time: 12/12/14  4:12 PM  Result Value Ref Range   Glucose-Capillary 84 65 - 99 mg/dL  Glucose, capillary     Status: None   Collection Time: 12/12/14  8:06 PM  Result Value Ref Range   Glucose-Capillary 70 65 - 99 mg/dL  Glucose, capillary      Status: None   Collection Time: 12/12/14 11:43 PM  Result Value Ref Range   Glucose-Capillary 92 65 - 99 mg/dL   Comment 1 Notify RN   Glucose, capillary     Status: None   Collection Time: 12/13/14  4:10 AM  Result Value Ref Range   Glucose-Capillary 90 65 - 99 mg/dL  Comprehensive metabolic panel  Status: Abnormal   Collection Time: 12/13/14  5:10 AM  Result Value Ref Range   Sodium 138 135 - 145 mmol/L   Potassium 4.2 3.5 - 5.1 mmol/L   Chloride 117 (H) 101 - 111 mmol/L   CO2 17 (L) 22 - 32 mmol/L   Glucose, Bld 82 65 - 99 mg/dL   BUN 9 6 - 20 mg/dL   Creatinine, Ser 0.94 0.44 - 1.00 mg/dL   Calcium 6.5 (L) 8.9 - 10.3 mg/dL   Total Protein 5.3 (L) 6.5 - 8.1 g/dL   Albumin 2.1 (L) 3.5 - 5.0 g/dL   AST 17 15 - 41 U/L   ALT 18 14 - 54 U/L   Alkaline Phosphatase 161 (H) 38 - 126 U/L   Total Bilirubin 0.8 0.3 - 1.2 mg/dL   GFR calc non Af Amer >60 >60 mL/min   GFR calc Af Amer >60 >60 mL/min    Comment: (NOTE) The eGFR has been calculated using the CKD EPI equation. This calculation has not been validated in all clinical situations. eGFR's persistently <60 mL/min signify possible Chronic Kidney Disease.    Anion gap 4 (L) 5 - 15  CBC     Status: Abnormal   Collection Time: 12/13/14  5:10 AM  Result Value Ref Range   WBC 13.7 (H) 4.0 - 10.5 K/uL   RBC 2.37 (L) 3.87 - 5.11 MIL/uL   Hemoglobin 7.5 (L) 12.0 - 15.0 g/dL   HCT 23.0 (L) 36.0 - 46.0 %   MCV 97.0 78.0 - 100.0 fL   MCH 31.6 26.0 - 34.0 pg   MCHC 32.6 30.0 - 36.0 g/dL   RDW 22.4 (H) 11.5 - 15.5 %   Platelets 140 (L) 150 - 400 K/uL    Comment: DELTA CHECK NOTED  Glucose, capillary     Status: None   Collection Time: 12/13/14  7:23 AM  Result Value Ref Range   Glucose-Capillary 72 65 - 99 mg/dL  Glucose, capillary     Status: Abnormal   Collection Time: 12/13/14 11:30 AM  Result Value Ref Range   Glucose-Capillary 61 (L) 65 - 99 mg/dL  Glucose, capillary     Status: None   Collection Time: 12/13/14  12:16 PM  Result Value Ref Range   Glucose-Capillary 75 65 - 99 mg/dL  Glucose, capillary     Status: Abnormal   Collection Time: 12/13/14  4:43 PM  Result Value Ref Range   Glucose-Capillary 59 (L) 65 - 99 mg/dL  Glucose, capillary     Status: None   Collection Time: 12/13/14  5:18 PM  Result Value Ref Range   Glucose-Capillary 65 65 - 99 mg/dL    Lipid Panel No results for input(s): CHOL, TRIG, HDL, CHOLHDL, VLDL, LDLCALC in the last 72 hours.  Studies/Results:   Medications:  Scheduled Meds: . sodium chloride   Intravenous Once  . antiseptic oral rinse  7 mL Mouth Rinse BID  . bumetanide  0.5 mg Intravenous Q12H  . ciprofloxacin  500 mg Oral BID  . diltiazem  60 mg Oral 3 times per day  . insulin aspart  0-9 Units Subcutaneous 6 times per day  . lacosamide (VIMPAT) IV  50 mg Intravenous Q12H  . metoprolol succinate  50 mg Oral Daily  . metroNIDAZOLE  500 mg Oral 3 times per day  . [START ON 12/14/2014] pantoprazole  40 mg Oral Daily  . sodium chloride  3 mL Intravenous Q12H   Continuous Infusions: . dextrose 5 %  1,000 mL with potassium chloride 20 mEq, sodium bicarbonate 100 mEq infusion 70 mL/hr at 12/13/14 1340   PRN Meds:.acetaminophen (TYLENOL) oral liquid 160 mg/5 mL, acetaminophen, albuterol, dextrose, morphine injection, ondansetron (ZOFRAN) IV     LOS: 10 days   Mouhamad Teed A. [name], M.D.  Diplomate, Tax adviser of Psychiatry and Neurology ( Neurology).

## 2014-12-13 NOTE — Progress Notes (Signed)
PT Cancellation Note  Patient Details Name: Catherine Mccullough MRN: [id number] DOB: [birth year]   Cancelled Treatment:    Reason Eval/Treat Not Completed: Patient declined, no reason specified.  Pt once again refuses PT.  I tried to convince her to at least let me help her to get up to the recliner but she flatly refuses.  We will have to stop attempts to see her for eval due to repeated refusals.  When I explained this to the pt she thanked me for stopping my attempts.   Demetrios Isaacs L  PT 12/13/2014, 10:53 AM [phone]

## 2014-12-14 DIAGNOSIS — R197 Diarrhea, unspecified: Secondary | ICD-10-CM

## 2014-12-14 LAB — GLUCOSE, CAPILLARY
GLUCOSE-CAPILLARY: 71 mg/dL (ref 65–99)
Glucose-Capillary: 58 mg/dL — ABNORMAL LOW (ref 65–99)
Glucose-Capillary: 66 mg/dL (ref 65–99)
Glucose-Capillary: 68 mg/dL (ref 65–99)
Glucose-Capillary: 69 mg/dL (ref 65–99)
Glucose-Capillary: 74 mg/dL (ref 65–99)

## 2014-12-14 LAB — COMPREHENSIVE METABOLIC PANEL
ALT: 14 U/L (ref 14–54)
AST: 14 U/L — AB (ref 15–41)
Albumin: 2.1 g/dL — ABNORMAL LOW (ref 3.5–5.0)
Alkaline Phosphatase: 155 U/L — ABNORMAL HIGH (ref 38–126)
Anion gap: 8 (ref 5–15)
BILIRUBIN TOTAL: 0.9 mg/dL (ref 0.3–1.2)
BUN: 7 mg/dL (ref 6–20)
CHLORIDE: 110 mmol/L (ref 101–111)
CO2: 18 mmol/L — ABNORMAL LOW (ref 22–32)
CREATININE: 1 mg/dL (ref 0.44–1.00)
Calcium: 6.7 mg/dL — ABNORMAL LOW (ref 8.9–10.3)
GFR calc Af Amer: >60 mL/min (ref >60)
GFR, EST NON AFRICAN AMERICAN: 57 mL/min — AB (ref >60)
Glucose, Bld: 77 mg/dL (ref 65–99)
Potassium: 4.2 mmol/L (ref 3.5–5.1)
Sodium: 136 mmol/L (ref 135–145)
TOTAL PROTEIN: 5.6 g/dL — AB (ref 6.5–8.1)

## 2014-12-14 LAB — CBC
HCT: 24.5 % — ABNORMAL LOW (ref 36.0–46.0)
Hemoglobin: 8.1 g/dL — ABNORMAL LOW (ref 12.0–15.0)
MCH: 31.5 pg (ref 26.0–34.0)
MCHC: 33.1 g/dL (ref 30.0–36.0)
MCV: 95.3 fL (ref 78.0–100.0)
PLATELETS: 212 10*3/uL (ref 150–400)
RBC: 2.57 MIL/uL — ABNORMAL LOW (ref 3.87–5.11)
RDW: 21.4 % — AB (ref 11.5–15.5)
WBC: 15.7 10*3/uL — AB (ref 4.0–10.5)

## 2014-12-14 MED ORDER — BUMETANIDE 0.25 MG/ML IJ SOLN
0.5000 mg | Freq: Every day | INTRAMUSCULAR | Status: DC
  Administered 2014-12-15 – 2014-12-18 (×4): 0.5 mg via INTRAVENOUS
  Filled 2014-12-14 (×4): qty 4

## 2014-12-14 MED ORDER — LOPERAMIDE HCL 2 MG PO CAPS
4.0000 mg | ORAL_CAPSULE | Freq: Once | ORAL | Status: AC
  Administered 2014-12-14: 4 mg via ORAL
  Filled 2014-12-14: qty 2

## 2014-12-14 MED ORDER — LACOSAMIDE 50 MG PO TABS
50.0000 mg | ORAL_TABLET | Freq: Two times a day (BID) | ORAL | Status: DC
  Administered 2014-12-14 – 2014-12-18 (×8): 50 mg via ORAL
  Filled 2014-12-14 (×8): qty 1

## 2014-12-14 MED ORDER — INSULIN ASPART 100 UNIT/ML ~~LOC~~ SOLN: 0.0000 [IU] | Freq: Three times a day (TID) | SUBCUTANEOUS | Status: DC

## 2014-12-14 MED ORDER — ASPIRIN 81 MG PO CHEW: 81.0000 mg | CHEWABLE_TABLET | Freq: Every day | ORAL | Status: DC

## 2014-12-14 MED ORDER — INSULIN ASPART 100 UNIT/ML ~~LOC~~ SOLN: 0.0000 [IU] | Freq: Every day | SUBCUTANEOUS | Status: DC

## 2014-12-14 MED ORDER — ASPIRIN 81 MG PO CHEW
162.0000 mg | CHEWABLE_TABLET | Freq: Every day | ORAL | Status: DC
  Administered 2014-12-14 – 2014-12-15 (×2): 162 mg via ORAL
  Filled 2014-12-14 (×2): qty 2

## 2014-12-14 NOTE — Clinical Social Work Note (Signed)
Patient continues to decline placement.   CSW signing off.  Ihor Gully, [place] [phone]

## 2014-12-14 NOTE — Progress Notes (Signed)
Daily Progress Note   Patient Name: Catherine Mccullough       Date: 12/14/2014 DOB: 10-Apr-1945  Age: 69 y.o. MRN#: [id number] Attending Physician: Rexene Alberts, MD Primary Care Physician: Default, Provider, MD Admit Date: 12/03/2014  Reason for Consultation/Follow-up: Establishing goals of care and Psychosocial/spiritual support  Subjective: Ms. Dinucci is sitting in the chair today.  She makes and keeps eye contact.  She tells me that she is ready to go back to bed, and we talk about sitting up to get her strength.  She tells me that she has been eating, and she is able to lift her arms today.  She is known to decline participation in ST/PT and therefor I do not push her today.  She tells me that he goal is to return home, and her perception is that she will not need help.  Her family is very supportive of her, but she at times makes this difficult for them d/t her desire for independence.    Length of Stay: 11 days  Current Medications: Scheduled Meds:  . sodium chloride   Intravenous Once  . antiseptic oral rinse  7 mL Mouth Rinse BID  . aspirin  162 mg Oral Daily  . [START ON 12/15/2014] bumetanide  0.5 mg Intravenous Daily  . ciprofloxacin  500 mg Oral BID  . diltiazem  60 mg Oral 3 times per day  . insulin aspart  0-5 Units Subcutaneous QHS  . insulin aspart  0-9 Units Subcutaneous TID WC  . lacosamide  50 mg Oral BID  . loperamide  4 mg Oral Once  . metoprolol succinate  50 mg Oral Daily  . metroNIDAZOLE  500 mg Oral 3 times per day  . pantoprazole  40 mg Oral Daily  . sodium chloride  3 mL Intravenous Q12H    Continuous Infusions: . dextrose 5 % 1,000 mL with potassium chloride 20 mEq, sodium bicarbonate 100 mEq infusion 70 mL/hr at 12/14/14 0438    PRN Meds: acetaminophen (TYLENOL) oral liquid 160 mg/5 mL, acetaminophen, albuterol, dextrose, morphine injection, ondansetron (ZOFRAN) IV  Palliative Performance Scale: 40%     Vital Signs: BP 140/45 mmHg  Pulse 95   Temp(Src) 98.7 F (37.1 C) (Oral)  Resp 21  Ht 4\' 7"  (1.397 m)  Wt 59.8 kg (131 lb 13.4 oz)  BMI 30.64 kg/m2  SpO2 100% SpO2: SpO2: 100 % O2 Device: O2 Device: Nasal Cannula O2 Flow Rate: O2 Flow Rate (L/min): 2 L/min  Intake/output summary:  Intake/Output Summary (Last 24 hours) at 12/14/14 1424 Last data filed at 12/14/14 0952  Gross per 24 hour  Intake 683.33 ml  Output   3000 ml  Net -2316.67 ml   LBM:   Baseline Weight: Weight: 56.7 kg (125 lb) Most recent weight: Weight: 59.8 kg (131 lb 13.4 oz)              Additional Data Reviewed: Recent Labs     12/13/14  0510  12/14/14  0453  WBC  13.7*  15.7*  HGB  7.5*  8.1*  PLT  140*  212  NA  138  136  BUN  9  7  CREATININE  0.94  1.00     Problem List:  Patient Active Problem List   Diagnosis Date Noted  . Diarrhea 12/13/2014  . Metabolic acidosis 123XX123  . Acute systolic congestive heart failure ([place]) 12/13/2014  . Acute blood loss anemia 12/09/2014  . Encephalopathy acute   . Acute respiratory  failure with hypoxia ([place])   . Status epilepticus ([place])   . Palliative care encounter   . DNR (do not resuscitate) discussion   . Mallory-Weiss tear   . AKI (acute kidney injury) ([place])   . Severe sepsis with septic shock ([place]) 08/21/2014  . Pulmonary insufficiency 08/21/2014  . Fracture five ribs-closed   . Hx MRSA infection 09/08/2012  . S/P total knee arthroplasty 09/08/2012  . Sepsis(995.91) 08/18/2011  . Breast cancer metastasized to bone ([place]) 08/18/2011  . Chronic pain 08/18/2011  . Dehydration 08/18/2011  . Right heart failure ([place]) 08/18/2011  . Thrombocytopenia ([place]) 08/18/2011  . Macrocytic anemia 08/18/2011  . CKD (chronic kidney disease) stage 4, GFR 15-29 ml/min (HCC) 08/18/2011  . DM type 2 (diabetes mellitus, type 2) ([place]) 08/18/2011  . OA (osteoarthritis) 08/18/2011  . Fall 08/18/2011  . Essential hypertension 08/18/2011  . Septic joint of left knee joint ([place]) 08/18/2011  . H/O  endocarditis 08/18/2011  . Left leg weakness 06/11/2011  . Difficulty in walking(719.7) 06/11/2011  . Pain in joint, shoulder region 05/29/2011  . Rotator cuff tear arthropathy of right shoulder 05/29/2011  . Muscle weakness (generalized) 05/29/2011     Palliative Care Assessment & Plan    Code Status:  DNR  Goals of Care:  Return to home.   Symptom Management:  Tylenol 650 mg PO/PR Q 6 hours PRN  Morphine 2 mg IV Q 4 hours PRN  Zofran 4 mg IV Q 6 hours PRN  Palliative Prophylaxis:  Loose stool at this time.   Psycho-social/Spiritual:  Desire for further Chaplaincy support:no   Prognosis: Unable to determine Discharge Planning: Home with [facility] was discussed with nursing staff, CM, SW, and Dr. Caryn Section.   Thank you for allowing the Palliative Medicine Team to assist in the care of this patient.   Time In: 1040 Time Out: 1110 Total Time 30 minutes Prolonged Time Billed  no     Greater than 50%  of this time was spent counseling and coordinating care related to the above assessment and plan.   Drue Novel, NP  12/14/2014, 2:24 PM  Please contact Palliative Medicine Team phone at [phone] for questions and concerns.

## 2014-12-14 NOTE — Progress Notes (Signed)
[facility] PROGRESS NOTE  Catherine Mccullough [id number] DOB: 1945-07-20 DOA: 12/03/2014 PCP: Default, Provider, MD    Code Status: DO NOT RESUSCITATE Family Communication: Discussed briefly with patient's father Disposition Plan: Discharge to home when clinically appropriate, likely in the next few days.   Consultants:  Pulmonology, Dr. Luan Pulling  Neurology, Dr. [name]  Gastroenterology, Dr. Oneida Alar  Procedures:  Intubation/mechanical ventilation. Extubation 12/09/14  EGD  Lumbar puncture  2-D echo on 12/06/14: Impressions: - Normal LV wall thickness with LVEF approximately 25% associated with wall motion abnormalities as outlined above suggesting possible ischemic cardiomyopathy. There is grade 1 diastolic dysfunction. Compared to the previous study in August 2016 there has been significant reduction in LVEF. MAC with mildly thickened mitral leaflets. Sclerotic aortic valve without stenosis. Mild RV dilatation and decreased contraction. Moderate right atrial enlargement. Moderate tricuspid regurgitation with PASP 35 mmHg. Difficult to assess degree of pulmonic regurgitation, but appears to be at least moderate.  Antibiotics:  Cipro 12/13/14  Flagyl 12/13/14  Previously treated with Zosyn and Zyvox initially; discontinued.  HPI/Subjective: Patient continues to have loose frequent stools. She says that her abdominal pain is less than yesterday. She is eating some of her foods without vomiting, but has a little nausea.  Objective: Filed Vitals:   12/13/14 2015 12/14/14 0414  BP: 133/65 140/45  Pulse: 97 95  Temp: 98.2 F (36.8 C) 98.7 F (37.1 C)  Resp: 22 21   oxygen saturation 100% on room air.  Intake/Output Summary (Last 24 hours) at 12/14/14 1243 Last data filed at 12/14/14 [id number]  Gross per 24 hour  Intake 803.33 ml  Output   3000 ml  Net -2196.67 ml   Filed Weights   12/09/14 0509 12/10/14 0500 12/11/14 0500  Weight:  67.1 kg (147 lb 14.9 oz) 62.1 kg (136 lb 14.5 oz) 59.8 kg (131 lb 13.4 oz)    Exam:   General:  Alert 69 year old African-American woman who appears debilitated, but in no acute distress.  Cardiovascular: S1, S2, without 2/6 systolic murmur.  Respiratory: Decreased breath sounds in the bases, clear anteriorly. Breathing nonlabored.  Abdomen: protuberant, positive bowel sounds, mildly tender in the epigastrium, mild distention.  Rectal: The patient is sitting in loose light brown stool.  GU: Foley catheter in place draining yellow urine.  Musculoskeletal/extremities: No acute hot red joints. Trace pedal edema bilaterally.   Neurologic: The patient is alert and oriented to herself in the hospital. Her speech is clear. Cranial nerves II through XII appear grossly intact.  Data Reviewed: Basic Metabolic Panel:  Recent Labs Lab 12/08/14 0433  12/10/14 0515 12/11/14 0435 12/12/14 0415 12/13/14 0510 12/14/14 0453  NA 135  < > 135 134* 138 138 136  K 4.9  < > 3.9 3.4* 4.6 4.2 4.2  CL 114*  < > 110 111 117* 117* 110  CO2 15*  < > 16* 17* 16* 17* 18*  GLUCOSE 131*  < > 168* 153* 106* 82 77  BUN 27*  < > 15 12 10 9 7   CREATININE 1.13*  < > 1.12* 1.05* 1.05* 0.94 1.00  CALCIUM 7.0*  < > 7.2* 6.7* 6.7* 6.5* 6.7*  MG 1.5*  --   --   --   --   --   --   PHOS 2.1*  --   --   --   --   --   --   < > = values in this interval not displayed. Liver Function Tests:  Recent Labs Lab  12/08/14 0433 12/11/14 0435 12/12/14 0415 12/13/14 0510 12/14/14 0453  AST 29 25 19 17  14*  ALT 16 25 22 18 14   ALKPHOS 105 145* 146* 161* 155*  BILITOT 1.0 1.0 0.7 0.8 0.9  PROT 4.8* 5.2* 5.4* 5.3* 5.6*  ALBUMIN 2.0* 2.1* 2.1* 2.1* 2.1*    Recent Labs Lab 12/11/14 0435  LIPASE 62*   No results for input(s): AMMONIA in the last 168 hours. CBC:  Recent Labs Lab 12/10/14 0515 12/11/14 0435 12/12/14 0415 12/13/14 0510 12/14/14 0453  WBC 15.5* 15.5* 13.7* 13.7* 15.7*  HGB 10.3* 8.9* 8.3*  7.5* 8.1*  HCT 31.0* 26.3* 24.9* 23.0* 24.5*  MCV 94.5 94.6 96.5 97.0 95.3  PLT 36* 57* 81* 140* 212   Cardiac Enzymes: No results for input(s): CKTOTAL, CKMB, CKMBINDEX, TROPONINI in the last 168 hours. BNP (last 3 results)  Recent Labs  03/04/14 0726 08/21/14 0029  BNP 1040.0* 1557.0*    ProBNP (last 3 results) No results for input(s): PROBNP in the last 8760 hours.  CBG:  Recent Labs Lab 12/13/14 2026 12/14/14 0027 12/14/14 0412 12/14/14 0803 12/14/14 1148  GLUCAP 65 69 66 71 58*    Recent Results (from the past 240 hour(s))  C difficile quick scan w PCR reflex     Status: None   Collection Time: 12/10/14 11:50 AM  Result Value Ref Range Status   C Diff antigen NEGATIVE NEGATIVE Final   C Diff toxin NEGATIVE NEGATIVE Final   C Diff interpretation Negative for toxigenic C. difficile  Final     Studies: No results found.  Scheduled Meds: . sodium chloride   Intravenous Once  . antiseptic oral rinse  7 mL Mouth Rinse BID  . bumetanide  0.5 mg Intravenous Q12H  . ciprofloxacin  500 mg Oral BID  . diltiazem  60 mg Oral 3 times per day  . insulin aspart  0-9 Units Subcutaneous 6 times per day  . lacosamide  50 mg Oral BID  . metoprolol succinate  50 mg Oral Daily  . metroNIDAZOLE  500 mg Oral 3 times per day  . pantoprazole  40 mg Oral Daily  . sodium chloride  3 mL Intravenous Q12H   Continuous Infusions: . dextrose 5 % 1,000 mL with potassium chloride 20 mEq, sodium bicarbonate 100 mEq infusion 70 mL/hr at 12/14/14 0438    Assessment and plan:  Principal Problem:   Encephalopathy acute Active Problems:   Acute respiratory failure with hypoxia (HCC)   Status epilepticus (HCC)   Breast cancer metastasized to bone (HCC)   Dehydration   Thrombocytopenia (HCC)   CKD (chronic kidney disease) stage 4, GFR 15-29 ml/min (HCC)   DM type 2 (diabetes mellitus, type 2) ([place])   Mallory-Weiss tear   Palliative care encounter   DNR (do not resuscitate)  discussion   Acute blood loss anemia   Diarrhea   Metabolic acidosis   Acute systolic congestive heart failure ([place])    1. Acute encephalopathy; status epilepticus The patient was brought to the hospital after being found her son, down forward is thought to be 3 days. Patient underwent a lumbar puncture which was not indicative of infection, but she did have an elevated CSF protein level. Neurologist, Dr. [name] was consulted. Per his assessment, her acute severe encephalopathy diagnosis was unclear, but with an associated very high CSF protein and hypothermia, most likely etiology was seizures/status epilepticus. -She was eventually intubated for airway protection and started on fentanyl infusion and Versed infusion. Anticonvulsant  treatment was changed from [place] to VIMPAT 50 mg IV q 12 hours, due to thrombocytopenia. -She was successfully extubated on 12/09/2014. -She has not had an active known seizure since the hospitalization. -Her encephalopathy has resolved.  Acute respiratory failure with hypoxia and hypercarbia. The patient was intubated following worsening respiratory status and for airway protection. Pulmonologist, Dr. Luan Pulling was consulted and assisted with ventilator management. -Due to the patient's initial lack of clinical improvement, the plan was for weaning and extubationand if the patient was unable to tolerate extubation or improve clinically, comfort measures were to be pursued with a morphine drip. This was discussed with the patient's family by Dr. Jerilee Hoh and Dr. Luan Pulling. Palliative care also discussed a possible terminal extubation with the family and they were in agreement with extubating the patient with no intent to reintubate her. -She was extubated successfully on 12/09/2014. Her respiratory status has improved and is stable. However, she does have bilateral pleural effusions which may be secondary to volume overload from volume resuscitation and packed red  blood cell transfusions.  Acute systolic congestive heart failure. Following admission, 2-D echocardiogram was ordered and revealed an EF of 25% which was significantly lower than 55-65% compared to the previous echocardiogram. The patient was intubated for airway protection, but because of her intubation and presumed poor prognosis, cardiac workup was postponed. Patient is treated with Bumex chronically. Abdominal CT revealed some ascites and pleural effusions which were likely from volume overload and lower EF. - Bumex was restarted and given IV every 12 hours 24 hours, then daily thereafter. and give IV. Her IV fluids were decreased. - Metoprolol XL and aspirin were restarted. -We'll add ARB, Cozaar. -We will likely defer cardiology workup to the outpatient setting and when the patient is not as deconditioned.   Atrial fibrillation with RVR. The patient developed paroxysmal atrial fibrillation with RVR. She was started on a diltiazem drip when she was intubated. Following extubation, the diltiazem drip was titrated off. She was started on every 8 hour dosing of by mouth diltiazem. Her heart rate has improved. On exam, it appears that her rhythm converted to normal sinus rhythm. -Her heart rate has improved. Will consider warfarin, but she is at high risk of falling and she has had a recent GI bleed. We'll continue aspirin for now. -We will arrange a follow-up outpatient appointment with cardiology.  Questionable presumed sepsis. Infective cultures were ordered on admission and the patient was started on broad-spectrum antibiotic treatment with Zosyn and Zyvox. There was no obvious source of infection given the negative chest x-ray, urinalysis, negative CSF cultures to date.  Her presentation with hypothermia and hypotension were likely secondary to seizure activity and prolonged immobility from the fall rather than infection. -Antibiotics were discontinued on 12/08/14.  Diarrhea/? Enteritis.   -On 12/10/2014, the patient developed several episodes of diarrhea or loose stools.  -Follow-up laboratory studies revealed that her white blood cell count had increased to greater than 15,000. She did receive broad-spectrum antibiotics initially.  -C. difficile PCR was negative.  -CT abdomen/pelvis without contrast on 12/5 showed bilateral pleural effusions, ascites, distended small bowel loops with air-fluid level suspicious for ileus /enteritis -Because of the ongoing loose stools, a GI pathogen panel was ordered and she was started empirically on Cipro and Flagyl for possible enteritis. Will order O&P. -We'll start a rectal tube. Will give as periodic Imodium.  Metabolic acidosis. The patient's CO2 has progressively decreased. This is likely secondary to bicarbonate losses from her loose stools. -IV  fluids were changed to D5 with 2 amps of bicarbonate for repletion. -We'll continue to monitor.  Hypothermia. The patient's temperature was 61F on admission. This was thought to be secondary to status epilepticus and being found down. With supportive measures, she became normothermic. -She was started on broad-spectrum antibiotics with Zyvox and Zosyn, but with no evidence of infection. These antibodies were discontinued.  Acute renal failure superimposed on stage III chronic kidney disease. The patient's baseline creatinine is 1.4-1.7. It was 2.5 on admission. Her creatinine has returned to baseline 1.05. Likely etiology was prerenal azotemia in the setting of sepsis.    Upper GI bleed with acute blood loss anemia. Patient underwent EGD on 11/27. She was found to have a Mallory-Weiss tear and erosive gastritis. GI placed 2 clips. -She was transfused a total of 2 units of packed red blood cells  -Her hemoglobin is drifting downward to 7.5, likely dilutional. It has improved to 8.1.  -We'll continue to follow her CBC and consider packed red blood cell transfusion again. -We'll continue PPI  started.   Thrombocytopenia. Patient's platelet count was 205 on admission. It has significantly and progressively decreased over the course of the hospitalization. It dropped to a nadir of 24 Her TSH and vitamin B12 were within normal. according to pharmacy,  the patient never received DVT prophylaxis with Lovenox or heparin, but did receive a heparin flush. Consulted hematology for thrombocytopenia. Hematology felt that the thrombocytopenia was likely multifactorial including reactive due to acute illness/infection, malignancy from breast cancer, hemodilution, consumption from GI bleed, and medication induced. Patient apparently has history of thrombocytopenia dating back to 2013 with anemia.  Protonix can possibly cause thrombocytopenia. Keppra can cause thrombocytopenia and it was discontinued in favor of Vimpat  -Patient's platelet count has progressively improved.  -We will refer her to outpatient hematology if needed.   Hypocalcemia. The patient's total calcium ranges from 6.5-7.0. With her hypoalbuminemia, her total calcium level nearly corrects. We'll continue to monitor.  Type 2 diabetes mellitus. Currently stable on sliding scale NovoLog.  Nutrition. The patient was started on TPN. It was discontinued on 12/08/14. Now on dysphagia 1 diet, as recommended by speech therapist for some dysphagia following expiration.    Time spent: 30 minutes.     [facility] Pager [phone]  If 7PM-7AM, please contact night-coverage at [email or web address], password [facility] 12/14/2014, 12:43 PM  LOS: 11 days

## 2014-12-14 NOTE — Care Management Note (Signed)
Case Management Note  Patient Details  Name: Catherine Mccullough MRN: [id number] Date of Birth: [birth year]  Subjective/Objective:                    Action/Plan:   Expected Discharge Date:  12/07/14               Expected Discharge Plan:  [place]  In-House Referral:  NA  Discharge planning Services  CM Consult  Post Acute Care Choice:  Home Health, Durable Medical Equipment Choice offered to:  Patient  DME Arranged:    DME Agency:     [name]:  RN, PT, Nurse's Aide, Social Work [employer] Agency:  [facility]  Status of Service:  In process, will continue to follow  Medicare Important Message Given:    Date Medicare IM Given:    Medicare IM give by:    Date Additional Medicare IM Given:    Additional Medicare Important Message give by:     If discussed at [place] of Stay Meetings, dates discussed: 12/14/14   Additional Comments:  Joylene Draft, RN 12/14/2014, 1:52 PM

## 2014-12-15 ENCOUNTER — Inpatient Hospital Stay (HOSPITAL_COMMUNITY): Payer: Medicare Other

## 2014-12-15 LAB — BASIC METABOLIC PANEL
ANION GAP: 7 (ref 5–15)
BUN: 7 mg/dL (ref 6–20)
CALCIUM: 6.7 mg/dL — AB (ref 8.9–10.3)
CHLORIDE: 110 mmol/L (ref 101–111)
CO2: 22 mmol/L (ref 22–32)
Creatinine, Ser: 1.02 mg/dL — ABNORMAL HIGH (ref 0.44–1.00)
GFR calc non Af Amer: 55 mL/min — ABNORMAL LOW (ref >60)
Glucose, Bld: 81 mg/dL (ref 65–99)
Potassium: 4.1 mmol/L (ref 3.5–5.1)
SODIUM: 139 mmol/L (ref 135–145)

## 2014-12-15 LAB — GI PATHOGEN PANEL BY PCR, STOOL
C difficile toxin A/B: NOT DETECTED
CAMPYLOBACTER BY PCR: NOT DETECTED
Cryptosporidium by PCR: NOT DETECTED
E COLI (STEC): NOT DETECTED
E coli (ETEC) LT/ST: NOT DETECTED
E coli 0157 by PCR: NOT DETECTED
G lamblia by PCR: NOT DETECTED
NOROVIRUS G1/G2: NOT DETECTED
ROTAVIRUS A BY PCR: NOT DETECTED
SALMONELLA BY PCR: NOT DETECTED
Shigella by PCR: NOT DETECTED

## 2014-12-15 LAB — CBC
HEMATOCRIT: 22.1 % — AB (ref 36.0–46.0)
HEMOGLOBIN: 7.3 g/dL — AB (ref 12.0–15.0)
MCH: 31.7 pg (ref 26.0–34.0)
MCHC: 33 g/dL (ref 30.0–36.0)
MCV: 96.1 fL (ref 78.0–100.0)
Platelets: 226 10*3/uL (ref 150–400)
RBC: 2.3 MIL/uL — ABNORMAL LOW (ref 3.87–5.11)
RDW: 21.1 % — AB (ref 11.5–15.5)
WBC: 10.7 10*3/uL — AB (ref 4.0–10.5)

## 2014-12-15 LAB — GLUCOSE, CAPILLARY
GLUCOSE-CAPILLARY: 71 mg/dL (ref 65–99)
GLUCOSE-CAPILLARY: 82 mg/dL (ref 65–99)
GLUCOSE-CAPILLARY: 73 mg/dL (ref 65–99)
Glucose-Capillary: 70 mg/dL (ref 65–99)

## 2014-12-15 MED ORDER — LORAZEPAM 2 MG/ML IJ SOLN
0.5000 mg | Freq: Once | INTRAMUSCULAR | Status: AC
  Administered 2014-12-15: 0.5 mg via INTRAVENOUS
  Filled 2014-12-15: qty 1

## 2014-12-15 MED ORDER — DILTIAZEM HCL 30 MG PO TABS
30.0000 mg | ORAL_TABLET | Freq: Two times a day (BID) | ORAL | Status: DC
  Administered 2014-12-15 – 2014-12-18 (×6): 30 mg via ORAL
  Filled 2014-12-15 (×6): qty 1

## 2014-12-15 MED ORDER — LOSARTAN POTASSIUM 50 MG PO TABS
25.0000 mg | ORAL_TABLET | Freq: Every day | ORAL | Status: DC
  Administered 2014-12-16 – 2014-12-18 (×3): 25 mg via ORAL
  Filled 2014-12-15 (×4): qty 1

## 2014-12-15 NOTE — Care Management Note (Signed)
Case Management Note  Patient Details  Name: SHAYAN MANCIL MRN: [id number] Date of Birth: [birth year]  Subjective/Objective:                    Action/Plan:   Expected Discharge Date:  12/07/14               Expected Discharge Plan:  [place]  In-House Referral:  NA  Discharge planning Services  CM Consult  Post Acute Care Choice:  Home Health, Durable Medical Equipment Choice offered to:  Patient  DME Arranged:    DME Agency:     [name]:  RN, PT, Nurse's Aide, Social Work [employer] Agency:  [facility]  Status of Service:  Completed, signed off  Medicare Important Message Given:  Yes Date Medicare IM Given:    Medicare IM give by:    Date Additional Medicare IM Given:    Additional Medicare Important Message give by:     If discussed at [place] of Stay Meetings, dates discussed:    Additional Comments: Pt is still adamant that she is discharging home when medically stable.Pts son and pts father is in the room with the pt and they have verbalized concerns of pt going home alone. Pts son stated that he is unable to provide sufficient support to pt. Pt stated she has a walker and life alert button. Pt refuses a wheelchair at this time.  Pt would like [facility] for home health services, RN, PT, aide, and CSW. Weekend staff to call and fax home health orders if pt discharge over the weekend. Christinia Gully Gunter, RN 12/15/2014, 1:12 PM

## 2014-12-15 NOTE — Care Management Important Message (Signed)
Important Message  Patient Details  Name: Catherine Mccullough MRN: [id number] Date of Birth: 01/31/1945   Medicare Important Message Given:  Yes    Joylene Draft, RN 12/15/2014, 1:11 PM

## 2014-12-15 NOTE — Progress Notes (Addendum)
[facility] PROGRESS NOTE  Catherine Mccullough [id number] DOB: 1945/02/12 DOA: 12/03/2014 PCP: Default, Provider, MD    Code Status: DO NOT RESUSCITATE Family Communication: Discussed briefly with patient's father Disposition Plan: Discharge to home when clinically appropriate, likely in the next few days.   Consultants:  Hematology/oncology  Pulmonology, Dr. Luan Pulling  Neurology, Dr. [name]  Gastroenterology, Dr. Oneida Alar  Procedures:  Intubation/mechanical ventilation. Extubation 12/09/14  EGD  Lumbar puncture  2-D echo on 12/06/14: Impressions: - Normal LV wall thickness with LVEF approximately 25% associated with wall motion abnormalities as outlined above suggesting possible ischemic cardiomyopathy. There is grade 1 diastolic dysfunction. Compared to the previous study in August 2016 there has been significant reduction in LVEF. MAC with mildly thickened mitral leaflets. Sclerotic aortic valve without stenosis. Mild RV dilatation and decreased contraction. Moderate right atrial enlargement. Moderate tricuspid regurgitation with PASP 35 mmHg. Difficult to assess degree of pulmonic regurgitation, but appears to be at least moderate.  Antibiotics:  Cipro 12/13/14  Flagyl 12/13/14  Previously treated with Zosyn and Zyvox initially; discontinued.  HPI/Subjective: Nursing reports there has been some decrease in the liquidy stools. Patient has less abdominal pain. She denies nausea vomiting. She denies chest pain or chest congestion.  Objective: Filed Vitals:   12/15/14 0610 12/15/14 1456  BP: 130/53 118/45  Pulse: 91 86  Temp: 98.3 F (36.8 C) 99.3 F (37.4 C)  Resp: 22 20   oxygen saturation 100% on room air.  Intake/Output Summary (Last 24 hours) at 12/15/14 1518 Last data filed at 12/15/14 0830  Gross per 24 hour  Intake    240 ml  Output   2900 ml  Net  -2660 ml   Filed Weights   12/09/14 0509 12/10/14 0500 12/11/14 0500   Weight: 67.1 kg (147 lb 14.9 oz) 62.1 kg (136 lb 14.5 oz) 59.8 kg (131 lb 13.4 oz)    Exam:   General:  Alert 69 year old African-American woman who appears debilitated, but in no acute distress.  Cardiovascular: S1, S2, without 2/6 systolic murmur.  Respiratory: Decreased breath sounds in the bases, clear anteriorly. Breathing nonlabored.  Abdomen: protuberant, positive bowel sounds, mildly tender in the epigastrium, softer abdomen with no distention.  Rectal: Rectal tube in with small amount of light brown stool.  GU: Foley catheter in place draining yellow urine.  Musculoskeletal/extremities: No acute hot red joints. Trace pedal edema bilaterally.   Neurologic: The patient is alert and oriented to herself in the hospital. Her speech is clear. Cranial nerves II through XII appear grossly intact.  Data Reviewed: Basic Metabolic Panel:  Recent Labs Lab 12/11/14 0435 12/12/14 0415 12/13/14 0510 12/14/14 0453 12/15/14 0516  NA 134* 138 138 136 139  K 3.4* 4.6 4.2 4.2 4.1  CL 111 117* 117* 110 110  CO2 17* 16* 17* 18* 22  GLUCOSE 153* 106* 82 77 81  BUN 12 10 9 7 7   CREATININE 1.05* 1.05* 0.94 1.00 1.02*  CALCIUM 6.7* 6.7* 6.5* 6.7* 6.7*   Liver Function Tests:  Recent Labs Lab 12/11/14 0435 12/12/14 0415 12/13/14 0510 12/14/14 0453  AST 25 19 17  14*  ALT 25 22 18 14   ALKPHOS 145* 146* 161* 155*  BILITOT 1.0 0.7 0.8 0.9  PROT 5.2* 5.4* 5.3* 5.6*  ALBUMIN 2.1* 2.1* 2.1* 2.1*    Recent Labs Lab 12/11/14 0435  LIPASE 62*   No results for input(s): AMMONIA in the last 168 hours. CBC:  Recent Labs Lab 12/11/14 0435 12/12/14 0415 12/13/14 0510 12/14/14  [id number] 12/15/14 0516  WBC 15.5* 13.7* 13.7* 15.7* 10.7*  HGB 8.9* 8.3* 7.5* 8.1* 7.3*  HCT 26.3* 24.9* 23.0* 24.5* 22.1*  MCV 94.6 96.5 97.0 95.3 96.1  PLT 57* 81* 140* 212 226   Cardiac Enzymes: No results for input(s): CKTOTAL, CKMB, CKMBINDEX, TROPONINI in the last 168 hours. BNP (last 3  results)  Recent Labs  03/04/14 0726 08/21/14 0029  BNP 1040.0* 1557.0*    ProBNP (last 3 results) No results for input(s): PROBNP in the last 8760 hours.  CBG:  Recent Labs Lab 12/14/14 0803 12/14/14 1148 12/14/14 1640 12/14/14 2006 12/15/14 0758  GLUCAP 71 58* 68 74 71    Recent Results (from the past 240 hour(s))  C difficile quick scan w PCR reflex     Status: None   Collection Time: 12/10/14 11:50 AM  Result Value Ref Range Status   C Diff antigen NEGATIVE NEGATIVE Final   C Diff toxin NEGATIVE NEGATIVE Final   C Diff interpretation Negative for toxigenic C. difficile  Final  Giardia/Cryptosporidium EIA     Status: None (Preliminary result)   Collection Time: 12/13/14 12:42 PM  Result Value Ref Range Status   Giardia Ag, Stl Negative Negative Final    Comment: (NOTE) Performed At: [facility] [place], [place] [id number] Lindon Romp MD [id number]    Cryptosporidium EIA PENDING  Incomplete   Source of Sample STOOL  Final     Studies: No results found.  Scheduled Meds: . sodium chloride   Intravenous Once  . antiseptic oral rinse  7 mL Mouth Rinse BID  . aspirin  162 mg Oral Daily  . bumetanide  0.5 mg Intravenous Daily  . ciprofloxacin  500 mg Oral BID  . diltiazem  60 mg Oral 3 times per day  . insulin aspart  0-5 Units Subcutaneous QHS  . insulin aspart  0-9 Units Subcutaneous TID WC  . lacosamide  50 mg Oral BID  . metoprolol succinate  50 mg Oral Daily  . metroNIDAZOLE  500 mg Oral 3 times per day  . pantoprazole  40 mg Oral Daily  . sodium chloride  3 mL Intravenous Q12H   Continuous Infusions: . dextrose 5 % 1,000 mL with potassium chloride 20 mEq, sodium bicarbonate 100 mEq infusion 70 mL/hr at 12/14/14 0438    Assessment and plan:  Principal Problem:   Encephalopathy acute Active Problems:   Acute respiratory failure with hypoxia (HCC)   Status epilepticus (HCC)   Breast cancer metastasized to bone  (HCC)   Dehydration   Thrombocytopenia (HCC)   CKD (chronic kidney disease) stage 4, GFR 15-29 ml/min (HCC)   DM type 2 (diabetes mellitus, type 2) ([place])   Mallory-Weiss tear   Palliative care encounter   DNR (do not resuscitate) discussion   Acute blood loss anemia   Diarrhea   Metabolic acidosis   Acute systolic congestive heart failure ([place])    1. Acute encephalopathy; status epilepticus The patient was brought to the hospital after being found her son, down forward is thought to be 3 days. Patient underwent a lumbar puncture which was not indicative of infection, but she did have an elevated CSF protein level. Neurologist, Dr. [name] was consulted. Per his assessment, her acute severe encephalopathy diagnosis was unclear, but with an associated very high CSF protein and hypothermia, most likely etiology was seizures/status epilepticus. -She was eventually intubated for airway protection and started on fentanyl infusion and Versed infusion. Anticonvulsant treatment was changed  from [place] to VIMPAT 50 mg IV q 12 hours, due to thrombocytopenia. -She was successfully extubated on 12/09/2014. -She has not had an active known seizure since the hospitalization. -Her encephalopathy has resolved.  Acute respiratory failure with hypoxia and hypercarbia. The patient was intubated following worsening respiratory status and for airway protection. Pulmonologist, Dr. Luan Pulling was consulted and assisted with ventilator management. -Due to the patient's initial lack of clinical improvement, the plan was for weaning and extubationand if the patient was unable to tolerate extubation or improve clinically, comfort measures were to be pursued with a morphine drip. This was discussed with the patient's family by Dr. Jerilee Hoh and Dr. Luan Pulling. Palliative care also discussed a possible terminal extubation with the family and they were in agreement with extubating the patient with no intent to reintubate  her. -She was extubated successfully on 12/09/2014. Her respiratory status has improved and is stable. However, she does have bilateral pleural effusions which may be secondary to volume overload from volume resuscitation and packed red blood cell transfusions.  Acute systolic congestive heart failure. Following admission, 2-D echocardiogram was ordered and revealed an EF of 25% which was significantly lower than 55-65% compared to the previous echocardiogram. The patient was intubated for airway protection, but because of her intubation and presumed poor prognosis, cardiac workup was postponed. Patient is treated with Bumex chronically. Abdominal CT revealed some ascites and pleural effusions which were likely from volume overload and lower EF. - Bumex was restarted and given IV every 12 hours 24 hours, then daily thereafter. and give IV. Her IV fluids were decreased. - Metoprolol XL and aspirin were restarted. -We'll add ARB, Cozaar. -We will likely defer cardiology workup to the outpatient setting and when the patient is not as deconditioned.   Atrial fibrillation with RVR. The patient developed paroxysmal atrial fibrillation with RVR. She was started on a diltiazem drip when she was intubated. Following extubation, the diltiazem drip was titrated off. She was started on every 8 hour dosing of by mouth diltiazem. Her heart rate has improved. On exam, it appears that her rhythm converted to normal sinus rhythm. -Her heart rate has improved. Will consider warfarin, but she is at high risk of falling and she has had a recent GI bleed. We'll continue aspirin for now. -We'll decrease the dose of diltiazem to 30 mg every 12 hours in light of starting Cozaar; her blood pressures are on the lower end of normal. -We will arrange a follow-up outpatient appointment with cardiology.  Questionable presumed sepsis. Infective cultures were ordered on admission and the patient was started on broad-spectrum  antibiotic treatment with Zosyn and Zyvox. There was no obvious source of infection given the negative chest x-ray, urinalysis, negative CSF cultures to date.  Her presentation with hypothermia and hypotension were likely secondary to seizure activity and prolonged immobility from the fall rather than infection. -Broad-spectrum antibiotics were discontinued on 12/08/14.  Diarrhea/? Enteritis.  -On 12/10/2014, the patient developed several episodes of diarrhea or loose stools.  -Follow-up laboratory studies revealed that her white blood cell count had increased to greater than 15,000. She did receive broad-spectrum antibiotics initially.   -CT abdomen/pelvis without contrast on 12/5 showed bilateral pleural effusions, ascites, distended small bowel loops with air-fluid level suspicious for ileus /enteritis -Because of the ongoing loose stools, a GI pathogen panel was ordered and she was started empirically on Cipro and Flagyl for possible enteritis. -A rectal tube was inserted. She was given 1 dose of Imodium. -C. difficile  PCR was negative. Giardia was negative. Cryptosporidium is pending.  Metabolic acidosis. The patient's CO2 has progressively decreased. This is likely secondary to bicarbonate losses from her loose stools. -IV fluids were changed to D5 with 2 amps of bicarbonate for repletion. -Her CO2 has improved.  Hypothermia. The patient's temperature was 28F on admission. This was thought to be secondary to status epilepticus and being found down. With supportive measures, she became normothermic. -She was started on broad-spectrum antibiotics with Zyvox and Zosyn, but with no evidence of infection. These antibodies were discontinued.  Acute renal failure superimposed on stage III chronic kidney disease. The patient's baseline creatinine is 1.4-1.7. It was 2.5 on admission. Her creatinine has returned to baseline 1.05. Likely etiology was prerenal azotemia in the setting of sepsis.     Upper GI bleed with acute blood loss anemia. Patient underwent EGD on 11/27. She was found to have a Mallory-Weiss tear and erosive gastritis. GI placed 2 clips. -She was transfused a total of 2 units of packed red blood cells  -Her hemoglobin is drifting downward to 7.5, likely dilutional. It has been waxing and waning, ranging from 7.5-8.1. -We'll continue to follow her CBC and consider packed red blood cell transfusion again, particularly if there is evidence of gross GI bleeding or she becomes hemodynamic Lee unstable. -We'll continue PPI started.   Thrombocytopenia. Patient's platelet count was 205 on admission. It has significantly and progressively decreased over the course of the hospitalization. It dropped to a nadir of 24 Her TSH and vitamin B12 were within normal. according to pharmacy,  the patient never received DVT prophylaxis with Lovenox or heparin, but did receive a heparin flush. Consulted hematology for thrombocytopenia. Hematology felt that the thrombocytopenia was likely multifactorial including reactive due to acute illness/infection, malignancy from breast cancer, hemodilution, consumption from GI bleed, and medication induced. Patient apparently has history of thrombocytopenia dating back to 2013 with anemia.  Protonix can possibly cause thrombocytopenia. Keppra can cause thrombocytopenia and it was discontinued in favor of Vimpat  -Patient's platelet count has progressively improved.  -We will refer her to outpatient hematology if needed.   Hypocalcemia. The patient's total calcium ranges from 6.5-7.0. With her hypoalbuminemia, her total calcium level nearly corrects. We'll continue to monitor.  Type 2 diabetes mellitus. Currently stable on sliding scale NovoLog.  Nutrition. The patient was started on TPN. It was discontinued on 12/08/14. Now on dysphagia 1 diet, as recommended by speech therapist for some dysphagia following expiration.    Time spent: 30  minutes.     [facility] Pager [phone]  If 7PM-7AM, please contact night-coverage at [email or web address], password [facility] 12/15/2014, 3:18 PM  LOS: 12 days

## 2014-12-15 NOTE — Progress Notes (Signed)
Hematology has been following platelet count from the periphery.  Now WNL.  We will sign off.  Please call if needed.  She is to follow-up with [facility] for her oncology care moving forward.  Torin Whisner, PA-C 12/15/2014 8:59 AM

## 2014-12-15 NOTE — Progress Notes (Signed)
Pt states that she is not having the MRI done if she is not put to sleep. The patient was pre medicated with ativan but it was not helpful. Will continue to monitor

## 2014-12-15 NOTE — Progress Notes (Signed)
Patient ID: Catherine Mccullough, female   DOB: [birth year], 69 y.o.   MRN: [id number]  Monona A. [name], MD     [email or web address]          Catherine Mccullough is an 69 y.o. female.   Assessment/Plan: Acute severe encephalopathy of unclear etiology but associated with very high CSF protein, seizures and hypothermia. CSF analysis and other testing does not support diagnosis of infection at this time. The differential diagnosis for very high CSF protein includes bacterial related meningitis, TB meningitis, brain tumors, intracranial trauma and intracranial hemorrhage. Given the seizures, I think this is the most likely etiology however. 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  Thrombocytopenia -  Multifactorial but Keppra very rarely can cause thrombocytopenia. ( Now improved off Keppra)  Atrial fibrillation.     RECOMMENDATION: Repeat CBC.  Increase the dose of Vimpat. Imaging of the brain with IV contrast once the patient is stable. MRI is preferable. Will order for new onset SZ Avoid sedatives.   She continues to improve and continues. A lot responsibility lucid. She does not report having a lot of abdominal pain today. There is no headache. She continues to have some leg weakness but this seems improved.   HEENT: Supple. Atraumatic normocephalic.   ABDOMEN: soft  EXTREMITIES: No edema   BACK: Normal.  SKIN: Normal by inspection.   MENTAL STATUS: She is awake and alert. She converses. She is lucid and the follows commands bilaterally.   CRANIAL NERVES: The pupils are deviated to the left and downward;  upper and lower facial muscles are normal in strength and symmetric, there is no flattening of the nasolabial folds. Cough and gag reflexes are intact.  She has full extraocular movements. No nystagmus is appreciated.  MOTOR: Bulk and tone are normal throughout.  She has antigravity strength of the upper extremities and legs.    COORDINATION: No tremors or dysmetria is appreciated.  REFLEXES: Deep tendon reflexes are symmetrical and normal. Babinski reflexes are flexor on the right but upgoing on the left  SENSATION: She responds to deep painful stimuli bilaterally   EEG shows moderate to severe global slowing. No epileptiform activities.   Objective: Vital signs in last 24 hours: Temp:  [98.3 F (36.8 C)-99.3 F (37.4 C)] 99.3 F (37.4 C) (12/09 1456) Pulse Rate:  [86-91] 86 (12/09 1456) Resp:  [20-22] 20 (12/09 1456) BP: (118-130)/(30-53) 118/45 mmHg (12/09 1456) SpO2:  [100 %] 100 % (12/09 1456)  Intake/Output from previous day: 12/08 0701 - 12/09 0700 In: 360 [P.O.:360] Out: 2900 [Urine:2900] Intake/Output this shift: Total I/O In: 120 [P.O.:120] Out: 950 [Urine:950] Nutritional status: DIET - DYS 1 Room service appropriate?: Yes; Fluid consistency:: Thin   Lab Results: Results for orders placed or performed during the hospital encounter of 12/03/14 (from the past 48 hour(s))  Glucose, capillary     Status: None   Collection Time: 12/13/14  8:26 PM  Result Value Ref Range   Glucose-Capillary 65 65 - 99 mg/dL  Glucose, capillary     Status: None   Collection Time: 12/14/14 12:27 AM  Result Value Ref Range   Glucose-Capillary 69 65 - 99 mg/dL  Glucose, capillary     Status: None   Collection Time: 12/14/14  4:12 AM  Result Value Ref Range   Glucose-Capillary 66 65 -  99 mg/dL  Comprehensive metabolic panel     Status: Abnormal   Collection Time: 12/14/14  4:53 AM  Result Value Ref Range   Sodium 136 135 - 145 mmol/L   Potassium 4.2 3.5 - 5.1 mmol/L   Chloride 110 101 - 111 mmol/L   CO2 18 (L) 22 - 32 mmol/L   Glucose, Bld 77 65 - 99 mg/dL   BUN 7 6 - 20 mg/dL   Creatinine, Ser 1.00 0.44 - 1.00 mg/dL    Calcium 6.7 (L) 8.9 - 10.3 mg/dL   Total Protein 5.6 (L) 6.5 - 8.1 g/dL   Albumin 2.1 (L) 3.5 - 5.0 g/dL   AST 14 (L) 15 - 41 U/L   ALT 14 14 - 54 U/L   Alkaline Phosphatase 155 (H) 38 - 126 U/L   Total Bilirubin 0.9 0.3 - 1.2 mg/dL   GFR calc non Af Amer 57 (L) >60 mL/min   GFR calc Af Amer >60 >60 mL/min    Comment: (NOTE) The eGFR has been calculated using the CKD EPI equation. This calculation has not been validated in all clinical situations. eGFR's persistently <60 mL/min signify possible Chronic Kidney Disease.    Anion gap 8 5 - 15  CBC     Status: Abnormal   Collection Time: 12/14/14  4:53 AM  Result Value Ref Range   WBC 15.7 (H) 4.0 - 10.5 K/uL   RBC 2.57 (L) 3.87 - 5.11 MIL/uL   Hemoglobin 8.1 (L) 12.0 - 15.0 g/dL   HCT 24.5 (L) 36.0 - 46.0 %   MCV 95.3 78.0 - 100.0 fL   MCH 31.5 26.0 - 34.0 pg   MCHC 33.1 30.0 - 36.0 g/dL   RDW 21.4 (H) 11.5 - 15.5 %   Platelets 212 150 - 400 K/uL  Glucose, capillary     Status: None   Collection Time: 12/14/14  8:03 AM  Result Value Ref Range   Glucose-Capillary 71 65 - 99 mg/dL  Glucose, capillary     Status: Abnormal   Collection Time: 12/14/14 11:48 AM  Result Value Ref Range   Glucose-Capillary 58 (L) 65 - 99 mg/dL  Glucose, capillary     Status: None   Collection Time: 12/14/14  4:40 PM  Result Value Ref Range   Glucose-Capillary 68 65 - 99 mg/dL  Glucose, capillary     Status: None   Collection Time: 12/14/14  8:06 PM  Result Value Ref Range   Glucose-Capillary 74 65 - 99 mg/dL  CBC     Status: Abnormal   Collection Time: 12/15/14  5:16 AM  Result Value Ref Range   WBC 10.7 (H) 4.0 - 10.5 K/uL   RBC 2.30 (L) 3.87 - 5.11 MIL/uL   Hemoglobin 7.3 (L) 12.0 - 15.0 g/dL   HCT 22.1 (L) 36.0 - 46.0 %   MCV 96.1 78.0 - 100.0 fL   MCH 31.7 26.0 - 34.0 pg   MCHC 33.0 30.0 - 36.0 g/dL   RDW 21.1 (H) 11.5 - 15.5 %   Platelets 226 150 - 400 K/uL  Basic metabolic panel     Status: Abnormal   Collection Time: 12/15/14   5:16 AM  Result Value Ref Range   Sodium 139 135 - 145 mmol/L   Potassium 4.1 3.5 - 5.1 mmol/L   Chloride 110 101 - 111 mmol/L   CO2 22 22 - 32 mmol/L   Glucose, Bld 81 65 - 99 mg/dL   BUN 7 6 - 20  mg/dL   Creatinine, Ser 1.02 (H) 0.44 - 1.00 mg/dL   Calcium 6.7 (L) 8.9 - 10.3 mg/dL   GFR calc non Af Amer 55 (L) >60 mL/min   GFR calc Af Amer >60 >60 mL/min    Comment: (NOTE) The eGFR has been calculated using the CKD EPI equation. This calculation has not been validated in all clinical situations. eGFR's persistently <60 mL/min signify possible Chronic Kidney Disease.    Anion gap 7 5 - 15  Glucose, capillary     Status: None   Collection Time: 12/15/14  7:58 AM  Result Value Ref Range   Glucose-Capillary 71 65 - 99 mg/dL  Glucose, capillary     Status: None   Collection Time: 12/15/14 11:19 AM  Result Value Ref Range   Glucose-Capillary 82 65 - 99 mg/dL   Comment 1 Notify RN   Glucose, capillary     Status: None   Collection Time: 12/15/14  4:27 PM  Result Value Ref Range   Glucose-Capillary 70 65 - 99 mg/dL   Comment 1 Notify RN     Lipid Panel No results for input(s): CHOL, TRIG, HDL, CHOLHDL, VLDL, LDLCALC in the last 72 hours.  Studies/Results:   Medications:  Scheduled Meds: . sodium chloride   Intravenous Once  . antiseptic oral rinse  7 mL Mouth Rinse BID  . aspirin  162 mg Oral Daily  . bumetanide  0.5 mg Intravenous Daily  . ciprofloxacin  500 mg Oral BID  . diltiazem  30 mg Oral Q12H  . insulin aspart  0-5 Units Subcutaneous QHS  . insulin aspart  0-9 Units Subcutaneous TID WC  . lacosamide  50 mg Oral BID  . [START ON 12/16/2014] losartan  25 mg Oral Daily  . metoprolol succinate  50 mg Oral Daily  . metroNIDAZOLE  500 mg Oral 3 times per day  . pantoprazole  40 mg Oral Daily  . sodium chloride  3 mL Intravenous Q12H   Continuous Infusions: . dextrose 5 % 1,000 mL with potassium chloride 20 mEq, sodium bicarbonate 100 mEq infusion 70 mL/hr at  12/14/14 0438   PRN Meds:.acetaminophen (TYLENOL) oral liquid 160 mg/5 mL, acetaminophen, albuterol, dextrose, morphine injection, ondansetron (ZOFRAN) IV     LOS: 12 days   Kofi A. [name], M.D.  Diplomate, Tax adviser of Psychiatry and Neurology ( Neurology).

## 2014-12-16 LAB — BASIC METABOLIC PANEL
Anion gap: 7 (ref 5–15)
BUN: 5 mg/dL — AB (ref 6–20)
CHLORIDE: 103 mmol/L (ref 101–111)
CO2: 24 mmol/L (ref 22–32)
CREATININE: 1.03 mg/dL — AB (ref 0.44–1.00)
Calcium: 6.7 mg/dL — ABNORMAL LOW (ref 8.9–10.3)
GFR calc Af Amer: >60 mL/min (ref >60)
GFR calc non Af Amer: 55 mL/min — ABNORMAL LOW (ref >60)
Glucose, Bld: 83 mg/dL (ref 65–99)
Potassium: 4.3 mmol/L (ref 3.5–5.1)
SODIUM: 134 mmol/L — AB (ref 135–145)

## 2014-12-16 LAB — CBC
HEMATOCRIT: 20.2 % — AB (ref 36.0–46.0)
HEMOGLOBIN: 6.8 g/dL — AB (ref 12.0–15.0)
MCH: 32.5 pg (ref 26.0–34.0)
MCHC: 33.7 g/dL (ref 30.0–36.0)
MCV: 96.7 fL (ref 78.0–100.0)
Platelets: 251 10*3/uL (ref 150–400)
RBC: 2.09 MIL/uL — ABNORMAL LOW (ref 3.87–5.11)
RDW: 20.8 % — ABNORMAL HIGH (ref 11.5–15.5)
WBC: 9.4 10*3/uL (ref 4.0–10.5)

## 2014-12-16 LAB — GLUCOSE, CAPILLARY
GLUCOSE-CAPILLARY: 70 mg/dL (ref 65–99)
GLUCOSE-CAPILLARY: 51 mg/dL — AB (ref 65–99)
Glucose-Capillary: 76 mg/dL (ref 65–99)
Glucose-Capillary: 73 mg/dL (ref 65–99)

## 2014-12-16 LAB — OCCULT BLOOD X 1 CARD TO LAB, STOOL: Fecal Occult Bld: POSITIVE — AB

## 2014-12-16 LAB — PREPARE RBC (CROSSMATCH)

## 2014-12-16 MED ORDER — BUMETANIDE 0.25 MG/ML IJ SOLN
0.5000 mg | Freq: Once | INTRAMUSCULAR | Status: AC
  Administered 2014-12-16: 0.5 mg via INTRAVENOUS
  Filled 2014-12-16: qty 4

## 2014-12-16 MED ORDER — SACCHAROMYCES BOULARDII 250 MG PO CAPS
250.0000 mg | ORAL_CAPSULE | Freq: Two times a day (BID) | ORAL | Status: DC
  Administered 2014-12-16 – 2014-12-18 (×5): 250 mg via ORAL
  Filled 2014-12-16 (×5): qty 1

## 2014-12-16 MED ORDER — SODIUM CHLORIDE 0.9 % IV SOLN
Freq: Once | INTRAVENOUS | Status: AC
  Administered 2014-12-16: 12:00:00 via INTRAVENOUS

## 2014-12-16 MED ORDER — PANTOPRAZOLE SODIUM 40 MG IV SOLR
40.0000 mg | INTRAVENOUS | Status: DC
  Administered 2014-12-16 – 2014-12-18 (×3): 40 mg via INTRAVENOUS
  Filled 2014-12-16 (×3): qty 40

## 2014-12-16 MED ORDER — CIPROFLOXACIN HCL 250 MG PO TABS
250.0000 mg | ORAL_TABLET | Freq: Two times a day (BID) | ORAL | Status: DC
  Administered 2014-12-16 – 2014-12-18 (×4): 250 mg via ORAL
  Filled 2014-12-16 (×4): qty 1

## 2014-12-16 MED ORDER — DIPHENOXYLATE-ATROPINE 2.5-0.025 MG PO TABS
1.0000 | ORAL_TABLET | Freq: Two times a day (BID) | ORAL | Status: AC
  Administered 2014-12-16 (×2): 1 via ORAL
  Filled 2014-12-16 (×2): qty 1

## 2014-12-16 MED ORDER — ENSURE ENLIVE PO LIQD
237.0000 mL | Freq: Two times a day (BID) | ORAL | Status: DC
  Administered 2014-12-17 – 2014-12-18 (×2): 237 mL via ORAL

## 2014-12-16 MED ORDER — KCL IN DEXTROSE-NACL 20-5-0.9 MEQ/L-%-% IV SOLN
INTRAVENOUS | Status: DC
  Administered 2014-12-16 – 2014-12-18 (×3): via INTRAVENOUS

## 2014-12-16 MED ORDER — METRONIDAZOLE 500 MG PO TABS
250.0000 mg | ORAL_TABLET | Freq: Three times a day (TID) | ORAL | Status: DC
  Administered 2014-12-16 – 2014-12-18 (×5): 250 mg via ORAL
  Filled 2014-12-16 (×5): qty 1

## 2014-12-16 NOTE — Progress Notes (Signed)
**Note De-Identified Catherine Mccullough Obfuscation** EKG results reported to RN

## 2014-12-16 NOTE — Progress Notes (Signed)
[facility] PROGRESS NOTE  Catherine Mccullough [id number] DOB: 21-Jun-1945 DOA: 12/03/2014 PCP: Default, Provider, MD    Code Status: DO NOT RESUSCITATE Family Communication: Discussed briefly with patient's father Disposition Plan: Discharge to home when clinically appropriate, likely in the next few days.   Consultants:  Hematology/oncology  Pulmonology, Dr. Luan Pulling  Neurology, Dr. [name]  Gastroenterology, Dr. Oneida Alar  Procedures:  Intubation/mechanical ventilation. Extubation 12/09/14  EGD  Lumbar puncture  2-D echo on 12/06/14: Impressions: - Normal LV wall thickness with LVEF approximately 25% associated with wall motion abnormalities as outlined above suggesting possible ischemic cardiomyopathy. There is grade 1 diastolic dysfunction. Compared to the previous study in August 2016 there has been significant reduction in LVEF. MAC with mildly thickened mitral leaflets. Sclerotic aortic valve without stenosis. Mild RV dilatation and decreased contraction. Moderate right atrial enlargement. Moderate tricuspid regurgitation with PASP 35 mmHg. Difficult to assess degree of pulmonic regurgitation, but appears to be at least moderate.  Antibiotics:  Cipro 12/13/14  Flagyl 12/13/14  Previously treated with Zosyn and Zyvox initially; discontinued.  HPI/Subjective: Patient said that her appetite is fair and she is trying to eat. She was to try to solid foods rather than pured foods. She thinks her diarrhea is slowing down. She denies hematemesis or nausea/vomiting.  Objective: Filed Vitals:   12/16/14 1156 12/16/14 1217  BP: 112/51 113/57  Pulse: 93 90  Temp: 98.3 F (36.8 C) 99.5 F (37.5 C)  Resp: 20 20   oxygen saturation 100% on room air.  Intake/Output Summary (Last 24 hours) at 12/16/14 1409 Last data filed at 12/16/14 0600  Gross per 24 hour  Intake   1810 ml  Output   1300 ml  Net    510 ml   Filed Weights   12/09/14  0509 12/10/14 0500 12/11/14 0500  Weight: 67.1 kg (147 lb 14.9 oz) 62.1 kg (136 lb 14.5 oz) 59.8 kg (131 lb 13.4 oz)    Exam:   General:  Alert 69 year old African-American woman in no acute distress.  Cardiovascular: S1, S2, without 2/6 systolic murmur.  Respiratory: Decreased breath sounds in the bases, clear anteriorly. Breathing nonlabored.  Abdomen: protuberant, positive bowel sounds, mildly tender in the epigastrium, softer abdomen with no distention.  Rectal: Rectal tube in small amount of liquidy greenish stool.  GU: Foley catheter in place draining yellow urine.  Musculoskeletal/extremities: No acute hot red joints. Trace pedal edema bilaterally.   Neurologic: The patient is alert and oriented to herself in the hospital. Her speech is clear. Cranial nerves II through XII appear grossly intact.  Data Reviewed: Basic Metabolic Panel:  Recent Labs Lab 12/12/14 0415 12/13/14 0510 12/14/14 0453 12/15/14 0516 12/16/14 0720  NA 138 138 136 139 134*  K 4.6 4.2 4.2 4.1 4.3  CL 117* 117* 110 110 103  CO2 16* 17* 18* 22 24  GLUCOSE 106* 82 77 81 83  BUN 10 9 7 7  5*  CREATININE 1.05* 0.94 1.00 1.02* 1.03*  CALCIUM 6.7* 6.5* 6.7* 6.7* 6.7*   Liver Function Tests:  Recent Labs Lab 12/11/14 0435 12/12/14 0415 12/13/14 0510 12/14/14 0453  AST 25 19 17  14*  ALT 25 22 18 14   ALKPHOS 145* 146* 161* 155*  BILITOT 1.0 0.7 0.8 0.9  PROT 5.2* 5.4* 5.3* 5.6*  ALBUMIN 2.1* 2.1* 2.1* 2.1*    Recent Labs Lab 12/11/14 0435  LIPASE 62*   No results for input(s): AMMONIA in the last 168 hours. CBC:  Recent Labs Lab 12/12/14 0415  12/13/14 0510 12/14/14 0453 12/15/14 0516 12/16/14 0720  WBC 13.7* 13.7* 15.7* 10.7* 9.4  HGB 8.3* 7.5* 8.1* 7.3* 6.8*  HCT 24.9* 23.0* 24.5* 22.1* 20.2*  MCV 96.5 97.0 95.3 96.1 96.7  PLT 81* 140* 212 226 251   Cardiac Enzymes: No results for input(s): CKTOTAL, CKMB, CKMBINDEX, TROPONINI in the last 168 hours. BNP (last 3  results)  Recent Labs  03/04/14 0726 08/21/14 0029  BNP 1040.0* 1557.0*    ProBNP (last 3 results) No results for input(s): PROBNP in the last 8760 hours.  CBG:  Recent Labs Lab 12/15/14 1119 12/15/14 1627 12/15/14 2157 12/16/14 0747 12/16/14 1141  GLUCAP 82 70 73 76 70    Recent Results (from the past 240 hour(s))  C difficile quick scan w PCR reflex     Status: None   Collection Time: 12/10/14 11:50 AM  Result Value Ref Range Status   C Diff antigen NEGATIVE NEGATIVE Final   C Diff toxin NEGATIVE NEGATIVE Final   C Diff interpretation Negative for toxigenic C. difficile  Final  Giardia/Cryptosporidium EIA     Status: None (Preliminary result)   Collection Time: 12/13/14 12:42 PM  Result Value Ref Range Status   Giardia Ag, Stl Negative Negative Final    Comment: (NOTE) Performed At: [facility] [place], [place] [id number] Lindon Romp MD [id number]    Cryptosporidium EIA PENDING  Incomplete   Source of Sample STOOL  Final     Studies: No results found.  Scheduled Meds: . sodium chloride   Intravenous Once  . antiseptic oral rinse  7 mL Mouth Rinse BID  . bumetanide  0.5 mg Intravenous Daily  . bumetanide  0.5 mg Intravenous Once  . ciprofloxacin  500 mg Oral BID  . diltiazem  30 mg Oral Q12H  . insulin aspart  0-5 Units Subcutaneous QHS  . insulin aspart  0-9 Units Subcutaneous TID WC  . lacosamide  50 mg Oral BID  . losartan  25 mg Oral Daily  . metoprolol succinate  50 mg Oral Daily  . metroNIDAZOLE  500 mg Oral 3 times per day  . pantoprazole (PROTONIX) IV  40 mg Intravenous Q24H  . sodium chloride  3 mL Intravenous Q12H   Continuous Infusions: . dextrose 5 % 1,000 mL with potassium chloride 20 mEq, sodium bicarbonate 100 mEq infusion 70 mL/hr at 12/15/14 2205    Assessment and plan:  Principal Problem:   Encephalopathy acute Active Problems:   Acute respiratory failure with hypoxia (HCC)   Status epilepticus  (HCC)   Breast cancer metastasized to bone (HCC)   Dehydration   Thrombocytopenia (HCC)   CKD (chronic kidney disease) stage 4, GFR 15-29 ml/min (HCC)   DM type 2 (diabetes mellitus, type 2) ([place])   Mallory-Weiss tear   Palliative care encounter   DNR (do not resuscitate) discussion   Acute blood loss anemia   Diarrhea   Metabolic acidosis   Acute systolic congestive heart failure ([place])    1. Acute encephalopathy; status epilepticus The patient was brought to the hospital after being found her son, down forward is thought to be 3 days. Patient underwent a lumbar puncture which was not indicative of infection, but she did have an elevated CSF protein level. Neurologist, Dr. [name] was consulted. Per his assessment, her acute severe encephalopathy diagnosis was unclear, but with an associated very high CSF protein and hypothermia, most likely etiology was seizures/status epilepticus. -She was eventually intubated for airway protection  and started on fentanyl infusion and Versed infusion. Anticonvulsant treatment was changed from [place] to VIMPAT 50 mg IV q 12 hours, due to thrombocytopenia. -She was successfully extubated on 12/09/2014. -She has not had an active known seizure since the hospitalization. -Her encephalopathy has resolved. -Dr. [name] ordered an MRI of her brain on 12/15/14, but the patient could not tolerate it secondary to claustrophobia even after given 0.5 mg of IV Ativan.  Acute respiratory failure with hypoxia and hypercarbia. The patient was intubated following worsening respiratory status and for airway protection. Pulmonologist, Dr. Luan Pulling was consulted and assisted with ventilator management. -Due to the patient's initial lack of clinical improvement, the plan was for weaning and extubationand if the patient was unable to tolerate extubation or improve clinically, comfort measures were to be pursued with a morphine drip. This was discussed with the patient's  family by Dr. Jerilee Hoh and Dr. Luan Pulling. Palliative care also discussed a possible terminal extubation with the family and they were in agreement with extubating the patient with no intent to reintubate her. -She was extubated successfully on 12/09/2014. Her respiratory status has improved and is stable. However, on a follow-up chest x-ray, she did have bilateral pleural effusions which may have been secondary to volume overload from volume resuscitation and packed red blood cell transfusions.  Acute systolic congestive heart failure. Following admission, 2-D echocardiogram was ordered and revealed an EF of 25% which was significantly lower than 55-65% compared to the previous echocardiogram. The patient was intubated for airway protection, but because of her intubation and presumed poor prognosis, cardiac workup was not pursued. Patient is treated with Bumex chronically. Abdominal CT revealed some ascites and pleural effusions which were likely from volume overload and lower EF. - Bumex was restarted and given IV every 12 hours 24 hours, then daily thereafter.  Her IV fluids were decreased. - Metoprolol XL and aspirin were restarted. ARB Cozaar was eventually added. -We will likely defer cardiology workup to the outpatient setting and when the patient is not as deconditioned.   Atrial fibrillation with RVR. The patient developed paroxysmal atrial fibrillation with RVR. She was started on a diltiazem drip when she was intubated. Following extubation, the diltiazem drip was titrated off. She was started on every 8 hour dosing of by mouth diltiazem. Her heart rate has improved. On exam, it appears that her rhythm converted to normal sinus rhythm. -Her heart rate has improved. Will consider warfarin, but she is at high risk of falling and she has had a recent GI bleed. Aspirin was restarted at 162 mg on 12/15/14. However will hold aspirin in light of guaiac positive stool and decrease in her H&H. -Diltiazem  changed to 30 mg twice a day in light of starting Cozaar and for her blood pressure being on the lower end of normal. -We will arrange a follow-up outpatient appointment with cardiology.  Questionable presumed sepsis. Infective cultures were ordered on admission and the patient was started on broad-spectrum antibiotic treatment with Zosyn and Zyvox. There was no obvious source of infection given the negative chest x-ray, urinalysis, negative CSF cultures to date.  Her presentation with hypothermia and hypotension were likely secondary to seizure activity and prolonged immobility from the fall rather than infection. -Broad-spectrum antibiotics were discontinued on 12/08/14.  Diarrhea/? Enteritis.  -On 12/10/2014, the patient developed several episodes of diarrhea or loose stools.  -Follow-up laboratory studies revealed that her white blood cell count had increased to greater than 15,000. She did receive broad-spectrum antibiotics initially.   -  CT abdomen/pelvis without contrast on 12/5 showed bilateral pleural effusions, ascites, distended small bowel loops with air-fluid level suspicious for ileus /enteritis -Because of the ongoing loose stools, a GI pathogen panel was ordered and she was started empirically on Cipro and Flagyl for possible enteritis. -A rectal tube was inserted. She was given 1 dose of Imodium. -C. difficile PCR was negative. Giardia was negative. Cryptosporidium is pending. -Patient's white blood cell count has normalized. Will change Cipro and Flagyl to by mouth. -We'll add a probiotic. We'll add Lomotil.  Metabolic acidosis. The patient's CO2 has progressively decreased. This is likely secondary to bicarbonate losses from her loose stools. -IV fluids were changed to D5 with 2 amps of bicarbonate for repletion. -Her CO2 has improved, so will discontinue bicarbonate in the IV fluids.  Hypothermia. The patient's temperature was 53F on admission. This was thought to be  secondary to status epilepticus and being found down. With supportive measures, she became normothermic. -She was started on broad-spectrum antibiotics with Zyvox and Zosyn, but with no evidence of infection. These antibodies were discontinued.  Acute renal failure superimposed on stage III chronic kidney disease. The patient's baseline creatinine is 1.4-1.7. It was 2.5 on admission. Her creatinine has returned to baseline 1.05. Likely etiology was prerenal azotemia in the setting of sepsis.    Upper GI bleed with acute blood loss anemia. Patient underwent EGD on 11/27. She was found to have a Mallory-Weiss tear and erosive gastritis. GI placed 2 clips. -She was transfused a total of 2 units of packed red blood cells earlier in the hospital course.  -After a stable hemoglobin, it started drifting downward again. -It has drifted down to 6.8 and her stool is guaiac positive. Aspirin restarted on 12/15/14, but it will be discontinued. -We'll transfuse her another 1 unit of packed red blood cells. Will continue PPI started.  Thrombocytopenia. Patient's platelet count was 205 on admission. It has significantly and progressively decreased over the course of the hospitalization. It dropped to a nadir of 24 Her TSH and vitamin B12 were within normal. according to pharmacy,  the patient never received DVT prophylaxis with Lovenox or heparin, but did receive a heparin flush. Consulted hematology for thrombocytopenia. Hematology felt that the thrombocytopenia was likely multifactorial including reactive due to acute illness/infection, malignancy from breast cancer, hemodilution, consumption from GI bleed, and medication induced. Patient apparently has history of thrombocytopenia dating back to 2013 with anemia.  Protonix can possibly cause thrombocytopenia. Keppra can cause thrombocytopenia and it was discontinued in favor of Vimpat  -Patient's platelet count has progressively improved.  -We will refer her  to outpatient hematology if needed.   Hypocalcemia. The patient's total calcium ranges from 6.5-7.0. With her hypoalbuminemia, her total calcium level nearly corrects. We'll continue to monitor.  Type 2 diabetes mellitus. Currently stable on sliding scale NovoLog.  Nutrition. The patient was started on TPN. It was discontinued on 12/08/14. Now on dysphagia 1 diet, as recommended by speech therapist for some dysphagia following extubation. -We'll assess speech therapist to reevaluate the patient for upgrading her diet.   Time spent: 30 minutes.     [facility] Pager [phone]  If 7PM-7AM, please contact night-coverage at [email or web address], password [facility] 12/16/2014, 2:09 PM  LOS: 13 days

## 2014-12-17 LAB — TYPE AND SCREEN
ABO/RH(D): A POS
ANTIBODY SCREEN: NEGATIVE
UNIT DIVISION: 0
Unit division: 0

## 2014-12-17 LAB — GLUCOSE, CAPILLARY
GLUCOSE-CAPILLARY: 81 mg/dL (ref 65–99)
Glucose-Capillary: 118 mg/dL — ABNORMAL HIGH (ref 65–99)
Glucose-Capillary: 76 mg/dL (ref 65–99)
Glucose-Capillary: 95 mg/dL (ref 65–99)
Glucose-Capillary: 98 mg/dL (ref 65–99)

## 2014-12-17 LAB — PROTIME-INR
INR: 1.86 — AB (ref 0.00–1.49)
PROTHROMBIN TIME: 21.4 s — AB (ref 11.6–15.2)

## 2014-12-17 LAB — GIARDIA/CRYPTOSPORIDIUM EIA
CRYPTOSPORIDIUM EIA: NEGATIVE
GIARDIA AG STL: NEGATIVE

## 2014-12-17 LAB — APTT: aPTT: 53 seconds — ABNORMAL HIGH (ref 24–37)

## 2014-12-17 LAB — CBC
HCT: 32.9 % — ABNORMAL LOW (ref 36.0–46.0)
HEMOGLOBIN: 10.9 g/dL — AB (ref 12.0–15.0)
MCH: 30.4 pg (ref 26.0–34.0)
MCHC: 33.1 g/dL (ref 30.0–36.0)
MCV: 91.9 fL (ref 78.0–100.0)
PLATELETS: 281 10*3/uL (ref 150–400)
RBC: 3.58 MIL/uL — ABNORMAL LOW (ref 3.87–5.11)
RDW: 18.8 % — AB (ref 11.5–15.5)
WBC: 11.3 10*3/uL — ABNORMAL HIGH (ref 4.0–10.5)

## 2014-12-17 MED ORDER — DIPHENOXYLATE-ATROPINE 2.5-0.025 MG PO TABS
1.0000 | ORAL_TABLET | Freq: Two times a day (BID) | ORAL | Status: AC
  Administered 2014-12-17 (×2): 1 via ORAL
  Filled 2014-12-17 (×2): qty 1

## 2014-12-17 MED ORDER — LOPERAMIDE HCL 2 MG PO CAPS
4.0000 mg | ORAL_CAPSULE | Freq: Three times a day (TID) | ORAL | Status: AC
  Administered 2014-12-17: 4 mg via ORAL
  Filled 2014-12-17: qty 2

## 2014-12-17 NOTE — Progress Notes (Signed)
[facility] PROGRESS NOTE  MA OURSLER [id number] DOB: [birth year] DOA: 12/03/2014 PCP: Default, Provider, MD    Code Status: DO NOT RESUSCITATE Family Communication: Discussed briefly with patient's father Disposition Plan: Discharge to home when clinically appropriate, likely in the next few days.   Consultants:  Hematology/oncology  Pulmonology, Dr. Luan Pulling  Neurology, Dr. [name]  Gastroenterology, Dr. Oneida Alar  Procedures:  Intubation/mechanical ventilation. Extubation 12/09/14  EGD  Lumbar puncture  2-D echo on 12/06/14: Impressions: - Normal LV wall thickness with LVEF approximately 25% associated with wall motion abnormalities as outlined above suggesting possible ischemic cardiomyopathy. There is grade 1 diastolic dysfunction. Compared to the previous study in August 2016 there has been significant reduction in LVEF. MAC with mildly thickened mitral leaflets. Sclerotic aortic valve without stenosis. Mild RV dilatation and decreased contraction. Moderate right atrial enlargement. Moderate tricuspid regurgitation with PASP 35 mmHg. Difficult to assess degree of pulmonic regurgitation, but appears to be at least moderate.  Antibiotics:  Cipro 12/13/14  Flagyl 12/13/14  Previously treated with Zosyn and Zyvox initially; discontinued.  HPI/Subjective: Patient said that her appetite is fair and she is trying to eat. She was to try to solid foods rather than pured foods. She thinks her diarrhea is slowing down. She denies hematemesis or nausea/vomiting.  Objective: Filed Vitals:   12/17/14 0501 12/17/14 1441  BP: 131/46 118/55  Pulse: 93 89  Temp: 98 F (36.7 C) 97.5 F (36.4 C)  Resp: 19 20   oxygen saturation 100% on room air.  Intake/Output Summary (Last 24 hours) at 12/17/14 1623 Last data filed at 12/17/14 1230  Gross per 24 hour  Intake 1692.33 ml  Output   1050 ml  Net 642.33 ml   Filed Weights   12/09/14  0509 12/10/14 0500 12/11/14 0500  Weight: 67.1 kg (147 lb 14.9 oz) 62.1 kg (136 lb 14.5 oz) 59.8 kg (131 lb 13.4 oz)    Exam:   General:  Alert 69 year old African-American woman in no acute distress.  Cardiovascular: S1, S2, without 2/6 systolic murmur.  Respiratory: Decreased breath sounds in the bases, clear anteriorly. Breathing nonlabored.  Abdomen: protuberant, positive bowel sounds, mildly tender in the epigastrium, softer abdomen with no distention.  Rectal: Rectal tube in small amount of liquidy greenish stool.  GU: Foley catheter in place draining yellow urine.  Musculoskeletal/extremities: No acute hot red joints. Trace pedal edema bilaterally.   Neurologic: The patient is alert and oriented to herself in the hospital. Her speech is clear. Cranial nerves II through XII appear grossly intact.  Data Reviewed: Basic Metabolic Panel:  Recent Labs Lab 12/12/14 0415 12/13/14 0510 12/14/14 0453 12/15/14 0516 12/16/14 0720  NA 138 138 136 139 134*  K 4.6 4.2 4.2 4.1 4.3  CL 117* 117* 110 110 103  CO2 16* 17* 18* 22 24  GLUCOSE 106* 82 77 81 83  BUN 10 9 7 7  5*  CREATININE 1.05* 0.94 1.00 1.02* 1.03*  CALCIUM 6.7* 6.5* 6.7* 6.7* 6.7*   Liver Function Tests:  Recent Labs Lab 12/11/14 0435 12/12/14 0415 12/13/14 0510 12/14/14 0453  AST 25 19 17  14*  ALT 25 22 18 14   ALKPHOS 145* 146* 161* 155*  BILITOT 1.0 0.7 0.8 0.9  PROT 5.2* 5.4* 5.3* 5.6*  ALBUMIN 2.1* 2.1* 2.1* 2.1*    Recent Labs Lab 12/11/14 0435  LIPASE 62*   No results for input(s): AMMONIA in the last 168 hours. CBC:  Recent Labs Lab 12/13/14 0510 12/14/14 0453 12/15/14 0516 12/16/14  0720 12/17/14 0640  WBC 13.7* 15.7* 10.7* 9.4 11.3*  HGB 7.5* 8.1* 7.3* 6.8* 10.9*  HCT 23.0* 24.5* 22.1* 20.2* 32.9*  MCV 97.0 95.3 96.1 96.7 91.9  PLT 140* 212 226 251 281   Cardiac Enzymes: No results for input(s): CKTOTAL, CKMB, CKMBINDEX, TROPONINI in the last 168 hours. BNP (last 3  results)  Recent Labs  03/04/14 0726 08/21/14 0029  BNP 1040.0* 1557.0*    ProBNP (last 3 results) No results for input(s): PROBNP in the last 8760 hours.  CBG:  Recent Labs Lab 12/16/14 1613 12/16/14 2209 12/17/14 0042 12/17/14 0747 12/17/14 1127  GLUCAP 73 51* 118* 81 98    Recent Results (from the past 240 hour(s))  C difficile quick scan w PCR reflex     Status: None   Collection Time: 12/10/14 11:50 AM  Result Value Ref Range Status   C Diff antigen NEGATIVE NEGATIVE Final   C Diff toxin NEGATIVE NEGATIVE Final   C Diff interpretation Negative for toxigenic C. difficile  Final  Giardia/Cryptosporidium EIA     Status: None   Collection Time: 12/13/14 12:42 PM  Result Value Ref Range Status   Giardia Ag, Stl Negative Negative Final   Cryptosporidium EIA Negative Negative Final    Comment: (NOTE) Performed At: [facility] [address] [place], [place] [id number] Lindon Romp MD [id number]    Source of Sample STOOL  Final     Studies: No results found.  Scheduled Meds: . sodium chloride   Intravenous Once  . antiseptic oral rinse  7 mL Mouth Rinse BID  . bumetanide  0.5 mg Intravenous Daily  . ciprofloxacin  250 mg Oral BID  . diltiazem  30 mg Oral Q12H  . diphenoxylate-atropine  1 tablet Oral BID  . feeding supplement (ENSURE ENLIVE)  237 mL Oral BID BM  . insulin aspart  0-5 Units Subcutaneous QHS  . insulin aspart  0-9 Units Subcutaneous TID WC  . lacosamide  50 mg Oral BID  . losartan  25 mg Oral Daily  . metoprolol succinate  50 mg Oral Daily  . metroNIDAZOLE  250 mg Oral 3 times per day  . pantoprazole (PROTONIX) IV  40 mg Intravenous Q24H  . saccharomyces boulardii  250 mg Oral BID  . sodium chloride  3 mL Intravenous Q12H   Continuous Infusions: . dextrose 5 % and 0.9 % NaCl with KCl 20 mEq/L 70 mL/hr at 12/17/14 1118    Assessment and plan:  Principal Problem:   Encephalopathy acute Active Problems:   Acute  respiratory failure with hypoxia (HCC)   Status epilepticus (HCC)   Breast cancer metastasized to bone (HCC)   Dehydration   Thrombocytopenia (HCC)   CKD (chronic kidney disease) stage 4, GFR 15-29 ml/min (HCC)   DM type 2 (diabetes mellitus, type 2) (HCC)   Mallory-Weiss tear   Palliative care encounter   DNR (do not resuscitate) discussion   Acute blood loss anemia   Diarrhea   Metabolic acidosis   Acute systolic congestive heart failure ([place])    1. Acute encephalopathy; status epilepticus The patient was brought to the hospital after being found her son, down forward is thought to be 3 days. Patient underwent a lumbar puncture which was not indicative of infection, but she did have an elevated CSF protein level. Neurologist, Dr. [name] was consulted. Per his assessment, her acute severe encephalopathy diagnosis was unclear, but with an associated very high CSF protein and hypothermia, most likely etiology  was seizures/status epilepticus. -She was eventually intubated for airway protection and started on fentanyl infusion and Versed infusion. Anticonvulsant treatment was changed from [place] to VIMPAT 50 mg IV q 12 hours, due to thrombocytopenia. -She was successfully extubated on 12/09/2014. -She has not had an active known seizure since the hospitalization. -Her encephalopathy has resolved. -Dr. [name] ordered an MRI of her brain on 12/15/14, but the patient could not tolerate it secondary to claustrophobia even after given 0.5 mg of IV Ativan.  Acute respiratory failure with hypoxia and hypercarbia. The patient was intubated following worsening respiratory status and for airway protection. Pulmonologist, Dr. Luan Pulling was consulted and assisted with ventilator management. -Due to the patient's initial lack of clinical improvement, the plan was for weaning and extubationand if the patient was unable to tolerate extubation or improve clinically, comfort measures were to be pursued  with a morphine drip. This was discussed with the patient's family by Dr. Jerilee Hoh and Dr. Luan Pulling. Palliative care also discussed a possible terminal extubation with the family and they were in agreement with extubating the patient with no intent to reintubate her. -She was extubated successfully on 12/09/2014. Her respiratory status has improved and is stable. However, on a follow-up chest x-ray, she did have bilateral pleural effusions which may have been secondary to volume overload from volume resuscitation and packed red blood cell transfusions.  Acute systolic congestive heart failure. Following admission, 2-D echocardiogram was ordered and revealed an EF of 25% which was significantly lower than 55-65% compared to the previous echocardiogram. The patient was intubated for airway protection, but because of her intubation and presumed poor prognosis, cardiac workup was not pursued. Patient is treated with Bumex chronically. Abdominal CT revealed some ascites and pleural effusions which were likely from volume overload and lower EF. - Bumex was restarted and given IV every 12 hours 24 hours, then daily thereafter.  Her IV fluids were decreased. - Metoprolol XL and aspirin were restarted. ARB Cozaar was eventually added. -We will likely defer cardiology workup to the outpatient setting and when the patient is not as deconditioned.   Atrial fibrillation with RVR. The patient developed paroxysmal atrial fibrillation with RVR. She was started on a diltiazem drip when she was intubated. Following extubation, the diltiazem drip was titrated off. She was started on every 8 hour dosing of by mouth diltiazem. Her heart rate has improved. On exam, it appears that her rhythm converted to normal sinus rhythm. -Her heart rate has improved. Will consider warfarin, but she is at high risk of falling and she has had a recent GI bleed. Aspirin was restarted at 162 mg on 12/15/14. However will hold aspirin in light  of guaiac positive stool and decrease in her H&H. -Diltiazem changed to 30 mg twice a day in light of starting Cozaar and for her blood pressure being on the lower end of normal. -We will arrange a follow-up outpatient appointment with cardiology.  Questionable presumed sepsis. Infective cultures were ordered on admission and the patient was started on broad-spectrum antibiotic treatment with Zosyn and Zyvox. There was no obvious source of infection given the negative chest x-ray, urinalysis, negative CSF cultures to date.  Her presentation with hypothermia and hypotension were likely secondary to seizure activity and prolonged immobility from the fall rather than infection. -Broad-spectrum antibiotics were discontinued on 12/08/14.  Diarrhea/? Enteritis.  -On 12/10/2014, the patient developed several episodes of diarrhea or loose stools.  -Follow-up laboratory studies revealed that her white blood cell count had increased  to greater than 15,000. She did receive broad-spectrum antibiotics initially.   -CT abdomen/pelvis without contrast on 12/5 showed bilateral pleural effusions, ascites, distended small bowel loops with air-fluid level suspicious for ileus /enteritis -Because of the ongoing loose stools, a GI pathogen panel was ordered and she was started empirically on Cipro and Flagyl for possible enteritis. -A rectal tube was inserted. She was given 1 dose of Imodium. -C. difficile PCR was negative. Giardia was negative. Cryptosporidium is negative. -Patient's white blood cell count has normalized. Cipro and Flagyl were changed to by mouth. Probiotic was added. -Also added was Imodium and Lomotil times several doses today. We'll continue to monitor.  Metabolic acidosis. The patient's CO2 has progressively decreased. This is likely secondary to bicarbonate losses from her loose stools. -IV fluids were changed to D5 with 2 amps of bicarbonate for repletion. -Her CO2 has improved, so will  discontinue bicarbonate in the IV fluids.  Hypothermia. The patient's temperature was 80F on admission. This was thought to be secondary to status epilepticus and being found down. With supportive measures, she became normothermic. -She was started on broad-spectrum antibiotics with Zyvox and Zosyn, but with no evidence of infection. These antibodies were discontinued.  Acute renal failure superimposed on stage III chronic kidney disease. The patient's baseline creatinine is 1.4-1.7. It was 2.5 on admission. Her creatinine has returned to baseline 1.05. Likely etiology was prerenal azotemia in the setting of sepsis.    Upper GI bleed with acute blood loss anemia. Patient underwent EGD on 11/27. She was found to have a Mallory-Weiss tear and erosive gastritis. GI placed 2 clips. -She was transfused a total of 2 units of packed red blood cells earlier in the hospital course.  -After a stable hemoglobin, it started drifting downward again. -It has drifted down to 6.8 and her stool is guaiac positive. Aspirin restarted on 12/15/14, but it was  discontinued. -She was transfused another unit of packed red blood cells on 12/16/14 with improvement in her hemoglobin. Will continue PPI started.  Thrombocytopenia. Patient's platelet count was 205 on admission. It has significantly and progressively decreased over the course of the hospitalization. It dropped to a nadir of 24 Her TSH and vitamin B12 were within normal. according to pharmacy,  the patient never received DVT prophylaxis with Lovenox or heparin, but did receive a heparin flush. Consulted hematology for thrombocytopenia. Hematology felt that the thrombocytopenia was likely multifactorial including reactive due to acute illness/infection, malignancy from breast cancer, hemodilution, consumption from GI bleed, and medication induced. Patient apparently has history of thrombocytopenia dating back to 2013 with anemia.  Protonix can possibly cause  thrombocytopenia. Keppra can cause thrombocytopenia and it was discontinued in favor of Vimpat  -Patient's platelet count has progressively improved.  -We will refer her to outpatient hematology if needed.   Hypocalcemia. The patient's total calcium ranges from 6.5-7.0. With her hypoalbuminemia, her total calcium level nearly corrects. We'll continue to monitor.  Type 2 diabetes mellitus. Currently stable on sliding scale NovoLog.  Nutrition. The patient was started on TPN. It was discontinued on 12/08/14. Now on dysphagia 1 diet, as recommended by speech therapist for some dysphagia following extubation. -We'll assess speech therapist to reevaluate the patient for upgrading her diet.   Time spent: 30 minutes.     [facility] Pager [phone]  If 7PM-7AM, please contact night-coverage at [email or web address], password [facility] 12/17/2014, 4:23 PM  LOS: 14 days

## 2014-12-18 ENCOUNTER — Encounter (HOSPITAL_COMMUNITY): Payer: Self-pay | Admitting: *Deleted

## 2014-12-18 DIAGNOSIS — N179 Acute kidney failure, unspecified: Secondary | ICD-10-CM

## 2014-12-18 DIAGNOSIS — R569 Unspecified convulsions: Secondary | ICD-10-CM | POA: Diagnosis present

## 2014-12-18 LAB — BASIC METABOLIC PANEL
Anion gap: 4 — ABNORMAL LOW (ref 5–15)
BUN: 9 mg/dL (ref 6–20)
CALCIUM: 6.7 mg/dL — AB (ref 8.9–10.3)
CHLORIDE: 113 mmol/L — AB (ref 101–111)
CO2: 19 mmol/L — ABNORMAL LOW (ref 22–32)
CREATININE: 1 mg/dL (ref 0.44–1.00)
GFR, EST NON AFRICAN AMERICAN: 57 mL/min — AB (ref >60)
Glucose, Bld: 98 mg/dL (ref 65–99)
Potassium: 4.6 mmol/L (ref 3.5–5.1)
SODIUM: 136 mmol/L (ref 135–145)

## 2014-12-18 LAB — CBC
HCT: 32.5 % — ABNORMAL LOW (ref 36.0–46.0)
Hemoglobin: 10.8 g/dL — ABNORMAL LOW (ref 12.0–15.0)
MCH: 30.9 pg (ref 26.0–34.0)
MCHC: 33.2 g/dL (ref 30.0–36.0)
MCV: 93.1 fL (ref 78.0–100.0)
PLATELETS: 290 10*3/uL (ref 150–400)
RBC: 3.49 MIL/uL — AB (ref 3.87–5.11)
RDW: 18.6 % — AB (ref 11.5–15.5)
WBC: 10.9 10*3/uL — AB (ref 4.0–10.5)

## 2014-12-18 LAB — GLUCOSE, CAPILLARY
GLUCOSE-CAPILLARY: 91 mg/dL (ref 65–99)
GLUCOSE-CAPILLARY: 98 mg/dL (ref 65–99)

## 2014-12-18 MED ORDER — METOPROLOL SUCCINATE ER 100 MG PO TB24: 100.0000 mg | ORAL_TABLET | Freq: Every day | ORAL | Status: DC

## 2014-12-18 MED ORDER — LOSARTAN POTASSIUM 25 MG PO TABS: 25.0000 mg | ORAL_TABLET | Freq: Every day | ORAL | Status: DC

## 2014-12-18 MED ORDER — LOSARTAN POTASSIUM 25 MG PO TABS: 25.0000 mg | ORAL_TABLET | Freq: Every day | ORAL | Status: AC

## 2014-12-18 MED ORDER — CIPROFLOXACIN HCL 500 MG PO TABS: 500.0000 mg | ORAL_TABLET | Freq: Two times a day (BID) | ORAL | Status: DC

## 2014-12-18 MED ORDER — DICLOFENAC SODIUM 1 % TD GEL: 2.0000 g | Freq: Three times a day (TID) | TRANSDERMAL | Status: DC | PRN

## 2014-12-18 MED ORDER — SODIUM BICARBONATE 650 MG PO TABS: 650.0000 mg | ORAL_TABLET | Freq: Two times a day (BID) | ORAL | Status: AC

## 2014-12-18 MED ORDER — SODIUM BICARBONATE 650 MG PO TABS
650.0000 mg | ORAL_TABLET | Freq: Two times a day (BID) | ORAL | Status: DC
Start: 2014-12-18 — End: 2014-12-18
  Administered 2014-12-18: 650 mg via ORAL
  Filled 2014-12-18: qty 1

## 2014-12-18 MED ORDER — SODIUM BICARBONATE 650 MG PO TABS: 650.0000 mg | ORAL_TABLET | Freq: Two times a day (BID) | ORAL | Status: DC

## 2014-12-18 MED ORDER — DICLOFENAC SODIUM 1 % TD GEL
2.0000 g | Freq: Three times a day (TID) | TRANSDERMAL | Status: DC
  Filled 2014-12-18: qty 100

## 2014-12-18 MED ORDER — CIPROFLOXACIN HCL 250 MG PO TABS: 500.0000 mg | ORAL_TABLET | Freq: Two times a day (BID) | ORAL | Status: DC

## 2014-12-18 MED ORDER — ESOMEPRAZOLE MAGNESIUM 40 MG PO CPDR: 40.0000 mg | DELAYED_RELEASE_CAPSULE | Freq: Every day | ORAL | Status: DC

## 2014-12-18 MED ORDER — METRONIDAZOLE 250 MG PO TABS
250.0000 mg | ORAL_TABLET | Freq: Three times a day (TID) | ORAL | Status: DC
Start: 2014-12-18 — End: 2014-12-18

## 2014-12-18 MED ORDER — LACOSAMIDE 50 MG PO TABS: 50.0000 mg | ORAL_TABLET | Freq: Two times a day (BID) | ORAL | Status: AC

## 2014-12-18 MED ORDER — METRONIDAZOLE 250 MG PO TABS: 250.0000 mg | ORAL_TABLET | Freq: Three times a day (TID) | ORAL | Status: DC

## 2014-12-18 NOTE — Progress Notes (Signed)
Daily Progress Note   Patient Name: Catherine Mccullough       Date: 12/18/2014 DOB: 12-27-1945  Age: 69 y.o. MRN#: [id number] Attending Physician: Rexene Alberts, MD Primary Care Physician: Default, Provider, MD Admit Date: 12/03/2014  Reason for Consultation/Follow-up: Establishing goals of care and Psychosocial/spiritual support  Subjective: Catherine Mccullough is resting quietly in bed.  She makes but does not keep eye contact.   She looks at me with a 'distrustful' look.  She agrees when I tell her that she will be going home soon.  She denies pain, dyspnea or any other complaints. She declines to have me help her get something to eat or drink.  We talk about her time in the hospital, and how she had stated that she didn't want to be here.  I ask if she is ok with coming back to the hospital in the future and she tells me that she is willing to come to the hospital.   Length of Stay: 15 days  Current Medications: Scheduled Meds:  . sodium chloride   Intravenous Once  . antiseptic oral rinse  7 mL Mouth Rinse BID  . bumetanide  0.5 mg Intravenous Daily  . ciprofloxacin  500 mg Oral BID  . diclofenac sodium  2 g Topical TID  . diltiazem  30 mg Oral Q12H  . feeding supplement (ENSURE ENLIVE)  237 mL Oral BID BM  . insulin aspart  0-5 Units Subcutaneous QHS  . insulin aspart  0-9 Units Subcutaneous TID WC  . lacosamide  50 mg Oral BID  . losartan  25 mg Oral Daily  . metoprolol succinate  50 mg Oral Daily  . metroNIDAZOLE  250 mg Oral 3 times per day  . pantoprazole (PROTONIX) IV  40 mg Intravenous Q24H  . saccharomyces boulardii  250 mg Oral BID  . sodium bicarbonate  650 mg Oral BID  . sodium chloride  3 mL Intravenous Q12H    Continuous Infusions: . dextrose 5 % and 0.9 % NaCl with KCl 20 mEq/L 70 mL/hr at 12/18/14 0031    PRN Meds: acetaminophen (TYLENOL) oral liquid 160 mg/5 mL, acetaminophen, albuterol, dextrose, morphine injection, ondansetron (ZOFRAN) IV  Palliative  Performance Scale: 50% at best.     Vital Signs: BP 142/47 mmHg  Pulse 81  Temp(Src) 97.8 F (36.6 C) (Oral)  Resp 20  Ht 4\' 7"  (1.397 m)  Wt 59.8 kg (131 lb 13.4 oz)  BMI 30.64 kg/m2  SpO2 99% SpO2: SpO2: 99 % O2 Device: O2 Device: Not Delivered O2 Flow Rate: O2 Flow Rate (L/min): 2 L/min  Intake/output summary:  Intake/Output Summary (Last 24 hours) at 12/18/14 1110 Last data filed at 12/18/14 1000  Gross per 24 hour  Intake   2289 ml  Output   2700 ml  Net   -411 ml   LBM:   Baseline Weight: Weight: 56.7 kg (125 lb) Most recent weight: Weight: 59.8 kg (131 lb 13.4 oz)  Physical Exam: Constitutional: Ill appearing, frail, makes but does not keep eye contact. Oriented to person, place, and time,  and in no distress.    Cardiovascular: Normal rate, regular rhythm and normal heart sounds.  EPulmonary/Chest: even and non labored, diminished in bases.    Abdominal: Soft, rounded. Mild tenderness epigastric.   Nursing note and vitals reviewed.              Additional Data Reviewed: Recent Labs     12/16/14  0720  12/17/14  [id number]  12/18/14  0652  WBC  9.4  11.3*  10.9*  HGB  6.8*  10.9*  10.8*  PLT  251  281  290  NA  134*   --   136  BUN  5*   --   9  CREATININE  1.03*   --   1.00     Problem List:  Patient Active Problem List   Diagnosis Date Noted  . Diarrhea 12/13/2014  . Metabolic acidosis 123XX123  . Acute systolic congestive heart failure ([place]) 12/13/2014  . Acute blood loss anemia 12/09/2014  . Encephalopathy acute   . Acute respiratory failure with hypoxia ([place])   . Status epilepticus ([place])   . Palliative care encounter   . DNR (do not resuscitate) discussion   . Mallory-Weiss tear   . AKI (acute kidney injury) ([place])   . Severe sepsis with septic shock ([place]) 08/21/2014  . Pulmonary insufficiency 08/21/2014  . Fracture five ribs-closed   . Hx MRSA infection 09/08/2012  . S/P total knee arthroplasty 09/08/2012  . Sepsis(995.91) 08/18/2011  .  Breast cancer metastasized to bone ([place]) 08/18/2011  . Chronic pain 08/18/2011  . Dehydration 08/18/2011  . 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 Thrombocytopenia ([place]) 08/18/2011  . Macrocytic anemia 08/18/2011  . CKD (chronic kidney disease) stage 4, GFR 15-29 ml/min (HCC) 08/18/2011  . DM type 2 (diabetes mellitus, type 2) ([place]) 08/18/2011  . OA (osteoarthritis) 08/18/2011  . Fall 08/18/2011  . Essential hypertension 08/18/2011  . Septic joint of left knee joint ([place]) 08/18/2011  . H/O endocarditis 08/18/2011  . Left leg weakness 06/11/2011  . Difficulty in walking(719.7) 06/11/2011  . Pain in joint, shoulder region 05/29/2011  . Rotator cuff tear arthropathy of right shoulder 05/29/2011  . Muscle weakness (generalized) 05/29/2011     Palliative Care Assessment & Plan    Code Status:  DNR  Goals of Care:  Return to home.   Symptom Management:  Tylenol 650 mg PO/PR Q 4-6 hours PRN.   Voltaren 1% TID  Morphine 2 mg IV Q 4 hours PRN  Zofran 4 mg IV Q 6 hours PRN.   Palliative Prophylaxis:  Loose stool at this time.   Psycho-social/Spiritual:  Desire for further Chaplaincy support:  Not discussed today.    Prognosis: Unable to determine Discharge Planning: Home with [facility] was discussed with nursing staff, CM, and Dr. Caryn Section.   Thank you for allowing the Palliative Medicine Team to assist in the care of this patient.   Time In: 1040 Time Out: 1100 Total Time 20 minutes Prolonged Time Billed  no     Greater than 50%  of this time was spent counseling and coordinating care related to the above assessment and plan.   Drue Novel, NP  12/18/2014, 11:10 AM  Please contact Palliative Medicine Team phone at [phone] for questions and concerns.

## 2014-12-18 NOTE — Progress Notes (Signed)
Pt d/c'd from facility via EMS in stable condition.

## 2014-12-18 NOTE — Plan of Care (Signed)
Problem: Pain Managment: Goal: General experience of comfort will improve Outcome: Progressing Pt did require some pain medicine earlier in the shift, but has been resting comfortably in bed since.

## 2014-12-18 NOTE — Care Management Note (Signed)
Case Management Note  Patient Details  Name: Catherine Mccullough MRN: [id number] Date of Birth: 04-15-1945  Pt discharging home today with home health services through [facility]. [name], of [facility], updated on DC plan for today. Pt aware HH has 48 hours to make their first visit. Pt says she has "everything she needs" at home. Pt refuses wheelchair. Pt has plan for using transportation services to get to f/u appointments.   Expected Discharge Date:  12/07/14               Expected Discharge Plan:  [place]  In-House Referral:  NA  Discharge planning Services  CM Consult  Post Acute Care Choice:  Home Health, Durable Medical Equipment Choice offered to:  Patient  DME Arranged:    DME Agency:     [name]:  RN, PT, Nurse's Aide, Social Work [employer] Agency:  [place]  Status of Service:  Completed, signed off  Medicare Important Message Given:  Yes Date Medicare IM Given:    Medicare IM give by:    Date Additional Medicare IM Given:    Additional Medicare Important Message give by:     If discussed at [place] of Stay Meetings, dates discussed:    Additional Comments:  Sherald Barge, RN 12/18/2014, 11:25 AM

## 2014-12-18 NOTE — Progress Notes (Signed)
Speech Language Pathology Treatment: Dysphagia  Patient Details Name: Catherine Mccullough MRN: [id number] DOB: 08-Mar-1945 Today's Date: 12/18/2014 Time: [id number] SLP Time Calculation (min) (ACUTE ONLY): 24 min  Assessment / Plan / Recommendation Clinical Impression  Pt seen for upgraded po trials. She adamantly refused trials last week. Pt alert, but distrustful and seems to have cognitive impairment. She does not remember me from last week and proceeds to tell me about her neighbors and meals on wheels. Last week, pt did not seem to grasp that in order for her diet to be upgraded, SLP needed to assess her with different textures. Today, I just quickly stated that the doctor needed her to have a bite of at least 3 items in order to go home. Pt refused, but then agreeable to one bite of pears ("I don't want it."), one lima bean, and sips of water. Pt without dentures (says she has at home, but eats without) and has two posts in lower gums. Pt with some difficulty masticating, however she states that it is because she doesn't want it. She expectorated the lima bean. Will upgrade to D2/chopped and thin liquids. Pt refused to try: chicken, beef, tomato, corn, cracker, roll, and banana. SLP will sign off. Risk for aspiration appears low, however trials limited due to pt refusal.   HPI HPI: Catherine Mccullough is a 69 yo female who was admitted 12/03/2014 with severe sepsis. She was found down at home for an unknown period of time. She had intense metabolic acidemia. She was intubated 12/03/14  because of respiratory distress and hypoxia. She was eventually able to be extubated 12/09/14 and has done okay. She has had status epilepticus but no overt seizure activity recently. She has breast cancer which is metastatic to bone. She had a Mallory-Weiss tear and acute blood loss anemia. She has improved and has been able to come off the ventilator. She does not have any respiratory problems at baseline. She is having  trouble swallowing and is at risk for aspiration and a speech evaluation has been requested. She was unable to be switched to oral diltiazem because of her difficulty swallowing. CT Abdomen shows: Bilateral pleural effusion with bilateral posterior atelectasis again noted. Old appearing left lower anterior rib fractures with incomplete healing/union.Patient is awake and alert, able to follow commands. Breathing comfortably on supplemental oxygen via [place]. She was extubated on 12/09/2014. Remains NPO. Patient still on the Cardizem drip overnight. SLP asked to evaluate swallow due to reports of difficulty swallowing pills and s/p recent extubation in setting of encephalopathy. Pt with anasarca and reports pain when attempting to move limbs due to severity of swelling.       SLP Plan  Discharge SLP treatment due to (comment)     Recommendations  Diet recommendations: Dysphagia 2 (fine chop);Thin liquid Liquids provided via: Cup;Straw Medication Administration: Whole meds with liquid Supervision: Staff to assist with self feeding Compensations: Slow rate;Small sips/bites Postural Changes and/or Swallow Maneuvers: Seated upright 90 degrees;Upright 30-60 min after meal              Plan: Discharge SLP treatment due to (comment)  Thank you,  Genene Churn, [place]  [place] 12/18/2014, 2:07 PM

## 2014-12-18 NOTE — Discharge Summary (Addendum)
Physician Discharge Summary  Catherine Mccullough [id number] DOB: 03-04-1945 DOA: 12/03/2014  PCP: Default, Provider, MD  Admit date: 12/03/2014 Discharge date: 12/18/2014  Time spent: Greater than 30 minutes  Recommendations for Outpatient Follow-up:  1. Recommend follow-up of her hemoglobin with the restart of 81 mg aspirin daily. 2. Recommend follow-up of her blood pressure and heart rate with a change in her medication regimen. 3. Home health services were ordered at the time of discharge.    Discharge Diagnoses:  1. Acute encephalopathy, thought to be secondary to status epilepticus. 2. Acute respiratory failure with hypoxia and hypercarbia. Status post intubation and ventilation; successful extubation. 3. Paroxysmal atrial fibrillation with RVR. 4. Acute systolic congestive heart failure. EF was 25% per echo. 5. Questionable/presumed sepsis, culture negative. 6. Acute kidney injury superimposed on stage III chronic kidney disease. 7. Upper GI bleeding with acute blood loss anemia. -EGD on 11/27 showed a Mallory-Weiss tear and erosive gastritis; 2 clips were placed. 8. Thrombocytopenia, etiology likely multifactorial. 9. Metabolic acidosis, likely chronic. 10. Hypocalcemia secondary to hypoalbuminemia. 11. Diet-controlled diabetes mellitus. 12. Acute on chronic diarrhea. 13. Metastatic breast cancer. Followed by oncology in [place]. 14. Chronic right rotator cuff tear/ arthropathy. 15. DO NOT RESUSCITATE.   Discharge Condition: Improved, but chronically debilitated.  Diet recommendation: Carbohydrate modified.  Filed Weights   12/09/14 0509 12/10/14 0500 12/11/14 0500  Weight: 67.1 kg (147 lb 14.9 oz) 62.1 kg (136 lb 14.5 oz) 59.8 kg (131 lb 13.4 oz)    History of present illness:  Patient is a 69 y.o. female with hx of CKD, DM, breast cancer with bone mets, hx of diastolic CHF., found by her son at home after three days, unclear how long she was down. She was  brought to the ER hypothermic at 90 F, and BP 125, unresponsive, but was able to maintain her airway. In the ED, she was given IV Keppra, bear hugger and warm saline. EKG showed NSR with U waves, Cr 2.5,  Bicarb of 5, Na of 147 and Chloride of 125. Her UA was negative for infection. A femoral line was place on the right groin with triple lumens. As she was warming up, her BP expectedly dropped and she was given more IVF. She was started on Keppra for her seizure. She subsequently had a spinal tap, and was started on IV Zyvox and Aztreonam and Levaquin. She was admitted for further evaluation and management.  Of note, on admission, EDP did not feel that she was a candidate for PCCM transfer. Admitting physician, Dr. Raynelle Chary spoke with Dr Janann Colonel of PCCM. He recommended to check Ethylene Glycol, ABG, and an osmolar gap. He recommended IVF and pressor if required. He felt that unless there is Ethylene Glycol present, he did not feel that she needed PCCM.     Hospital Course:  1. Acute encephalopathy; status epilepticus The patient was brought to the hospital after being found her son, down forward is thought to be 3 days. Patient underwent a lumbar puncture which was not indicative of infection, but she did have an elevated CSF protein level. Neurologist, Dr. [name] was consulted. Per his assessment, her acute severe encephalopathy diagnosis was unclear, but with an associated very high CSF protein and hypothermia, most likely etiology was seizures/status epilepticus. -She was eventually intubated for airway protection and started on fentanyl infusion and Versed infusion. Anticonvulsant treatment was changed from [place] to VIMPAT 50 mg IV q 12 hours, due to thrombocytopenia. -She was successfully extubated on 12/09/2014.  She did not have evidence of seizures following extubation. Her encephalopathy resolved. -Subsequently, when she improved clinically, Dr. [name] ordered an MRI of her  brain on 12/15/14, but the patient could not tolerate it secondary to claustrophobia even after being given 0.5 mg of IV Ativan. -She was discharged on Vimpat.  Acute respiratory failure with hypoxia and hypercarbia. The patient was intubated following worsening respiratory status and for airway protection. Pulmonologist, Dr. Luan Pulling was consulted and assisted with ventilator management. -Due to the patient's initial lack of clinical improvement, the plan was for weaning and extubationand if the patient was unable to tolerate extubation or improve clinically, comfort measures were to be pursued with a morphine drip. This was discussed with the patient's family by Dr. Jerilee Hoh and Dr. Luan Pulling. Palliative care also discussed a possible terminal extubation with the family and they were in agreement with extubating the patient with no intent to reintubate her. -She was extubated successfully on 12/09/2014. Her respiratory status improved and stabilized.Marland Kitchen However, on a follow-up chest x-ray, she did have bilateral pleural effusions which may have been secondary to volume overload from volume resuscitation and packed red blood cell transfusions.  Acute systolic congestive heart failure. Following admission, 2-D echocardiogram was ordered and revealed an EF of 25% which was significantly lower than 55-65% compared to the previous echocardiogram. The patient was intubated for airway protection, but because of her intubation and presumed poor prognosis, cardiac workup was not pursued. Patient is treated with Bumex and Toprol-XL chronically. Abdominal CT revealed some ascites and pleural effusions which were likely from volume overload and lower EF. - Bumex was restarted and given IV every 12 hours 24 hours, then daily thereafter. Her IV fluids were decreased. - Metoprolol XL and aspirin were restarted. ARB Cozaar was eventually added. -Cardiology was not consulted during the hospital course as she appeared to  be stabilizing, but an appointment was made for her to follow-up with cardiology next week.  Atrial fibrillation with RVR. The patient developed paroxysmal atrial fibrillation with RVR. She was started on a diltiazem drip when she was intubated. Following extubation, the diltiazem drip was titrated off. She was started on every 8 hour dosing of by mouth diltiazem. Her heart rate has improved. On exam, it appears that her rhythm converted to normal sinus rhythm. -Her heart rate has improved. Warfarin was considered, but she is at high risk of falling and she had a recent GI bleed. Aspirin was restarted at 162 mg on 12/15/14 however it was discontinued when her hemoglobin dropped again. -Due to low-normal blood pressures and the start of Cozaar, diltiazem was discontinued. At the time of discharge, Toprol-XL was restarted at 100 mg daily (decreased from 250 mg daily). -Aspirin was restarted at 81 mg daily upon discharge. -She will follow-up with cardiology next week for further management.  Questionable sepsis on admission; not clinically determined. Infective cultures were ordered on admission and the patient was started on broad-spectrum antibiotic treatment with Zosyn and Zyvox. There was no obvious source of infection given the negative chest x-ray, urinalysis, negative CSF cultures to date. Her presentation with hypothermia and hypotension were likely secondary to seizure activity and prolonged immobility from the fall and less likely from infection. -Broad-spectrum antibiotics were discontinued on 12/08/14.  Diarrhea/? Enteritis.  -On 12/10/2014, the patient developed several episodes of diarrhea or loose stools.  -Follow-up laboratory studies revealed that her white blood cell count had increased to greater than 15,000. She did receive broad-spectrum antibiotics initially.  -CT abdomen/pelvis  without contrast on 12/5 showed bilateral pleural effusions, ascites, distended small bowel loops  with air-fluid level suspicious for ileus /enteritis -Because of the ongoing loose stools, a GI pathogen panel was ordered and she was started empirically on Cipro and Flagyl for possible enteritis. -A rectal tube was inserted. -C. difficile PCR was negative. Giardia was negative. Cryptosporidium is negative. -She was given Lomotil and Imodium. Her diarrhea subsided. -Patient's white blood cell count trended down. Cipro and Flagyl were changed to by mouth. Probiotic was added. -She was discharged on oral Cipro and Flagyl for few more days.  Metabolic acidosis. The patient's CO2 had progressively decreased. This was thought to be secondary to bicarbonate losses from her loose stools. -IV fluids were changed to D5 with 2 amps of bicarbonate for repletion. -Her CO2 has improved. Upon chart review before discharge, she had been treated with oral sodium bicarbonate chronically. She was discharged on twice a day dosing.  Hypothermia. The patient's temperature was 20F on admission. This was thought to be secondary to status epilepticus and being found down. With supportive measures, she became normothermic. -She was started on broad-spectrum antibiotics with Zyvox and Zosyn, but with no evidence of infection. These antibodies were discontinued.  Acute renal failure superimposed on stage III chronic kidney disease. The patient's baseline creatinine is 1.4-1.7. It was 2.5 on admission. Her creatinine has returned to baseline 1.00. Likely etiology was prerenal azotemia in the setting of sepsis.   Upper GI bleed with acute blood loss anemia. Patient underwent EGD on 11/27. She was found to have a Mallory-Weiss tear and erosive gastritis. GI placed 2 clips. -She was transfused a total of 2 units of packed red blood cells earlier in the hospital course.  -After a stable hemoglobin, it started drifting downward again. -It has drifted down to 6.8 and her stool was guaiac positive. Aspirin restarted at  162 mg on 12/15/14, but it was discontinued. -She was transfused another unit of packed red blood cells on 12/16/14 with improvement in her hemoglobin. -She was discharged on twice a day dosing of Nexium. Her hemoglobin was 10.8 at discharge.  Thrombocytopenia. Patient's platelet count was 205 on admission. It has significantly and progressively decreased over the course of the hospitalization. It dropped to a nadir of 24 Her TSH and vitamin B12 were within normal. according to pharmacy, the patient never received DVT prophylaxis with Lovenox or heparin, but did receive a heparin flush. Consulted hematology for thrombocytopenia. Hematology felt that the thrombocytopenia was likely multifactorial including reactive due to acute illness/infection, malignancy from breast cancer, hemodilution, consumption from GI bleed, and medication induced. Patient apparently has history of thrombocytopenia dating back to 2013 with anemia.  Protonix can possibly cause thrombocytopenia. Keppra can cause thrombocytopenia and it was discontinued in favor of Vimpat  -Patient's platelet count improved progressively and was within normal limits at discharge.  Type 2 diabetes mellitus. She was treated with sliding scale NovoLog during hospitalization. Her CBGs were practically normal toward the end of the hospitalization. She is treated with diet alone.  Nutrition. The patient was started on TPN while she was intubated. It was discontinued on 12/08/14. Dysphagia 1 diet, was started as recommended by the speech therapist for some dysphagia following extubation. -Patient refused follow-up ST evaluation for upgrade of her diet.  Chronic debilitation. The physical therapist evaluated the patient and recommended short-term skilled nursing facility placement. The patient declined on multiple occasions. Her son, the medical staff, physical therapy, and case management/social work all encouraged  her to agree to skilled  nursing facility placement for strengthening. She adamantly refused. She was alert and oriented at the time of her decision. This was discussed with her son who stated that he would try to do as much as he could to help her at home, but she would likely end up being rehospitalized again.  Procedures:  Intubation/mechanical ventilation. Extubation 12/09/14  EGD  Lumbar puncture  Right inguinal central line  2-D echo on 12/06/14: Impressions: - Normal LV wall thickness with LVEF approximately 25% associated with wall motion abnormalities as outlined above suggesting possible ischemic cardiomyopathy. There is grade 1 diastolic dysfunction. Compared to the previous study in August 2016 there has been significant reduction in LVEF. MAC with mildly thickened mitral leaflets. Sclerotic aortic valve without stenosis. Mild RV dilatation and decreased contraction. Moderate right atrial enlargement. Moderate tricuspid regurgitation with PASP 35 mmHg. Difficult to assess degree of pulmonic regurgitation, but appears to be at least moderate.  Consultations:  Hematology/oncology  Pulmonology, Dr. Luan Pulling  Neurology, Dr. [name]  Gastroenterology, Dr. Oneida Alar  Discharge Exam: Filed Vitals:   12/18/14 0132 12/18/14 0648  BP:  142/47  Pulse:  81  Temp: 99.7 F (37.6 C) 97.8 F (36.6 C)  Resp:  20   oxygen saturation 98% on room air.   General: Alert 69 year old African-American woman in no acute distress.  Cardiovascular: S1, S2, without 2/6 systolic murmur.  Respiratory: Decreased breath sounds in the bases, clear anteriorly. Breathing nonlabored.  Abdomen: protuberant, positive bowel sounds, mildly tender in the epigastrium, softer abdomen with no distention.  Rectal: Rectal tube in small amount of liquidy greenish stool.  GU: Foley catheter in place draining yellow urine.  Musculoskeletal/extremities: No acute hot red joints. Trace pedal edema  bilaterally.   Neurologic: The patient is alert and oriented to herself in the hospital. Her speech is clear. Cranial nerves II through XII appear grossly intact.  Discharge Instructions   Discharge Instructions    Diet - low sodium heart healthy    Complete by:  As directed      Diet Carb Modified    Complete by:  As directed      Discharge instructions    Complete by:  As directed   Take medications as prescribed. Follow-up with your doctors as scheduled.     Increase activity slowly    Complete by:  As directed           Current Discharge Medication List    START taking these medications   Details  ciprofloxacin (CIPRO) 500 MG tablet Take 1 tablet (500 mg total) by mouth 2 (two) times daily. Antibiotic to be taken for 3 more days. Qty: 6 tablet, Refills: 0    diclofenac sodium (VOLTAREN) 1 % GEL Apply 2 g topically 3 (three) times daily as needed (Applied to right shoulder as needed for pain). Qty: 100 g, Refills: 0    lacosamide (VIMPAT) 50 MG TABS tablet Take 1 tablet (50 mg total) by mouth 2 (two) times daily. Qty: 60 tablet, Refills: 3    losartan (COZAAR) 25 MG tablet Take 1 tablet (25 mg total) by mouth daily. Medication for your heart Qty: 30 tablet, Refills: 3    metroNIDAZOLE (FLAGYL) 250 MG tablet Take 1 tablet (250 mg total) by mouth every 8 (eight) hours. Take for 3 more days for your diarrhea. Qty: 9 tablet, Refills: 0      CONTINUE these medications which have CHANGED   Details  esomeprazole (NEXIUM) 40 MG  capsule Take 1 capsule (40 mg total) by mouth daily before breakfast. Qty: 60 capsule, Refills: 3    metoprolol succinate (TOPROL-XL) 100 MG 24 hr tablet Take 1 tablet (100 mg total) by mouth daily. Take with or immediately following a meal. Qty: 30 tablet, Refills: 3    sodium bicarbonate 650 MG tablet Take 1 tablet (650 mg total) by mouth 2 (two) times daily. Qty: 60 tablet, Refills: 3      CONTINUE these medications which have NOT CHANGED    Details  aspirin EC 81 MG tablet Take 81 mg by mouth daily.    bisacodyl (DULCOLAX) 5 MG EC tablet Take 5 mg by mouth daily as needed for moderate constipation.    bumetanide (BUMEX) 1 MG tablet Take 1 mg by mouth daily.     calcitRIOL (ROCALTROL) 0.25 MCG capsule Take 0.25 mcg by mouth every other day.    cetirizine (ZYRTEC) 10 MG tablet Take 5 mg by mouth daily as needed for allergies.    fluticasone (FLONASE) 50 MCG/ACT nasal spray Place 1 spray into both nostrils daily as needed for allergies.     montelukast (SINGULAIR) 10 MG tablet Take 10 mg by mouth at bedtime.      Multiple Vitamins-Minerals (MULTIVITAMINS THER. W/MINERALS) TABS Take 1 tablet by mouth daily.      PROAIR HFA 108 (90 BASE) MCG/ACT inhaler Take 2 puffs by mouth every 6 (six) hours as needed.      STOP taking these medications     diphenhydramine-acetaminophen (TYLENOL PM) 25-500 MG TABS tablet      docusate sodium (COLACE) 100 MG capsule      hydrocortisone cream 1 %      LYRICA 75 MG capsule      magnesium oxide (MAG-OX) 400 MG tablet      Melatonin 3 MG CAPS      oxyCODONE-acetaminophen (PERCOCET) 10-325 MG per tablet      polyethylene glycol (MIRALAX / GLYCOLAX) packet      sertraline (ZOLOFT) 50 MG tablet      clonazePAM (KLONOPIN) 0.5 MG tablet        Allergies  Allergen Reactions  . Mometasone Shortness Of Breath  . Vancomycin Itching  . Aspirin Other (See Comments)    "Inflames stomach"  . Contrast Media [Iodinated Diagnostic Agents] Other (See Comments)    Made Heart Stop.   . Cortisone Other (See Comments)    Hold fluid  . Doxycycline Nausea And Vomiting  . Ibuprofen Nausea And Vomiting  . Insulins Other (See Comments)    Pt says even the tiniest bit of insulin makes her go unconscious because her BS gets too low  . Lasix [Furosemide] Other (See Comments)    Paradoxical Response  . Other     Iv bp med unknown.and adhesive tape-silicones  . Prednisone     Sweating   .  Sulfamethoxazole Other (See Comments)    Bottomed out platelets  . Tape   . Ultram [Tramadol Hcl] Other (See Comments)    "Grand mal seizure"  . Codeine Rash  . Dilantin [Phenytoin Sodium] Rash  . Latex Rash  . Piperacillin Sod-Tazobactam So Rash   Follow-up Information    Follow up with [facility].   Contact information:   [address] [place] [address] [phone]       Follow up with Jory Sims, NP On 12/25/2014.   Specialties:  Nurse Practitioner, Radiology, Cardiology   Why:  Cardiology follow-up at 2:50  PM   Contact information:   [place] [address] [phone]       Follow up with Phillips Odor, MD On 01/03/2015.   Specialty:  Neurology   Why:  Neurology follow-up at 2:15 PM   Contact information:   [address] [place] [address] [phone]        The results of significant diagnostics from this hospitalization (including imaging, microbiology, ancillary and laboratory) are listed below for reference.    Significant Diagnostic Studies: Ct Abdomen Pelvis Wo Contrast  12/11/2014  CLINICAL DATA:  Diarrhea sepsis, metastatic breast cancer EXAM: CT ABDOMEN AND PELVIS WITHOUT CONTRAST TECHNIQUE: Multidetector CT imaging of the abdomen and pelvis was performed following the standard protocol without IV contrast. COMPARISON:  01/14/2011 FINDINGS: Bilateral pleural effusion with bilateral basilar posterior atelectasis again noted. There is small perihepatic ascites. Moderate perisplenic ascites. Postsurgical changes are noted GE junction region. The patient is status postcholecystectomy. Atherosclerotic calcifications of abdominal aorta and iliac arteries. No aortic aneurysm. Mild anasarca infiltration subcutaneous wall abdomen and pelvis. Unenhanced she pancreas spleen and adrenal glands are unremarkable. Unenhanced kidneys shows no nephrolithiasis. No hydronephrosis or hydroureter. Mild fluid distended small  bowel loops with some air-fluid level suspicious for ileus or enteritis. No transition point in caliber of small bowel to suggest small bowel obstruction. There is some liquid within right colon suspicious for diarrhea. Clinical correlation is necessary. The terminal ileum is unremarkable. Moderate pelvic ascites. There is a Foley catheter within decompressed urinary bladder. Small amount of air within bladder is probable post instrumentation. The patient is status post hysterectomy. Pelvic phleboliths are again noted. Mild levoscoliosis and degenerative changes lumbar spine. There is sclerosis of L2 vertebral body metastatic disease cannot be excluded. Degenerative changes pubic symphysis. Old appearing left lower anterior rib fractures with incomplete healing. There is a right femoral vein catheter with tip iliac vein. IMPRESSION: 1. Bilateral pleural effusion with bilateral posterior atelectasis again noted. Old appearing left lower anterior rib fractures with incomplete healing/union. 2. Again noted anasarca.  Status postcholecystectomy. 3. Small perihepatic ascites.  Moderate perisplenic ascites. 4. Mild fluid distended small bowel loops with some air-fluid level suspicious for ileus or enteritis. Some fluid is noted in right colon suspicious for diarrhea. No transition point in caliber of small bowel to suggest small bowel obstruction. 5. There is a Foley catheter within decompressed urinary bladder. 6. Status post hysterectomy. 7. Degenerative changes lumbar spine. Sclerosis of L2 vertebral body. Metastatic disease cannot be excluded Electronically Signed   By: Lahoma Crocker M.D.   On: 12/11/2014 11:47   Dg Abd 1 View  12/10/2014  CLINICAL DATA:  Abdominal pain EXAM: ABDOMEN - 1 VIEW COMPARISON:  None. FINDINGS: Nonobstructive bowel gas pattern. Moderate lumbar levoscoliosis with mild degenerative changes. Visualized bony pelvis appears intact. IMPRESSION: Unremarkable abdominal radiograph. Electronically  Signed   By: Julian Hy M.D.   On: 12/10/2014 16:33   Ct Head Wo Contrast  12/03/2014  CLINICAL DATA:  69 year old female found down by family. Uncertain how long patient has been down (last contact with family members was 3 days ago). EXAM: CT HEAD WITHOUT CONTRAST TECHNIQUE: Contiguous axial images were obtained from the base of the skull through the vertex without intravenous contrast. COMPARISON:  Head CT 04/26/2014. FINDINGS: Low-attenuation in the inferior aspect of the right frontal lobe, mild low-attenuation in the inferior aspect of the medial left frontal lobe, and low-attenuation in the anterior aspect of the right temporal lobe, all similar  to the prior examination, likely to reflect gliosis from remote trauma. Physiologic calcifications in the basal ganglia bilaterally. No acute intracranial abnormalities. Specifically, no evidence of acute intracranial hemorrhage, no definite findings of acute/subacute cerebral ischemia, no mass, mass effect, hydrocephalus or abnormal intra or extra-axial fluid collections. Visualized paranasal sinuses and mastoids are well pneumatized. No acute displaced skull fractures are identified. IMPRESSION: 1. No acute intracranial abnormalities. 2. Areas of probable gliosis in the frontal lobes bilaterally (right greater than left) and anterior aspect of the right temporal lobe, unchanged compared to the prior study, likely related to remote trauma. Electronically Signed   By: Vinnie Langton M.D.   On: 12/03/2014 13:07   Dg Chest Port 1 View  12/08/2014  CLINICAL DATA:  Increased fluid retention. Altered mental status with GI bleed and respiratory distress. History of seizures, congestive heart failure, diabetes, hypertension, peptic ulcer disease and metastatic breast cancer. EXAM: PORTABLE CHEST 1 VIEW COMPARISON:  12/05/2014 and 12/04/2014. FINDINGS: The endotracheal tube tip remains low, approximately 2 cm above the carina. There is increased patient  rotation to the right. The heart size and mediastinal contours are stable. Bibasilar atelectasis and probable small bilateral pleural effusions have not significantly changed. No evidence of pneumothorax. IMPRESSION: No significant change in bibasilar airspace opacities and pleural effusions. The endotracheal tube tip remains low. Electronically Signed   By: Richardean Sale M.D.   On: 12/08/2014 21:46   Dg Chest Port 1 View  12/05/2014  CLINICAL DATA:  Hypoxia EXAM: PORTABLE CHEST 1 VIEW COMPARISON:  December 04, 2014 FINDINGS: Endotracheal tube tip is 2.1 cm above the carina. No pneumothorax. There is a minimal right pleural effusion. There is slight bibasilar atelectasis. Lungs elsewhere clear. Heart size and pulmonary vascularity are normal. No adenopathy. A surgical clip is again noted at the level of the aortopulmonary window. IMPRESSION: Endotracheal tube as described without pneumothorax. Slight bibasilar atelectasis. Minimal right pleural effusion. No airspace consolidation appreciable. Electronically Signed   By: Lowella Grip III M.D.   On: 12/05/2014 08:05   Dg Chest Port 1 View  12/04/2014  CLINICAL DATA:  Sepsis EXAM: PORTABLE CHEST 1 VIEW COMPARISON:  12/03/2014 FINDINGS: Endotracheal tube tip is 3.5 cm above the carina. There is improvement, with partial clearance of basilar opacities, now with better definition of the diaphragmatic contours. No pneumothorax. IMPRESSION: Satisfactory ET tube position. Partial clearance of basilar opacities. Electronically Signed   By: Andreas Newport M.D.   On: 12/04/2014 06:43   Dg Chest Portable 1 View  12/03/2014  CLINICAL DATA:  Intubation. EXAM: PORTABLE CHEST 1 VIEW COMPARISON:  12/03/2014 FINDINGS: The endotracheal tube tip is 1.8 cm above the carina. Streaky opacities persist in both bases. No large effusions. No pneumothorax. IMPRESSION: Satisfactory ET tube position. No other significant interval change. Electronically Signed   By: Andreas Newport M.D.   On: 12/03/2014 21:26   Dg Chest Port 1 View  12/03/2014  CLINICAL DATA:  Follow-up right subclavian central line attempt EXAM: PORTABLE CHEST 1 VIEW COMPARISON:  Prior film same day FINDINGS: Cardiomediastinal silhouette is stable. Persistent streaky right basilar atelectasis or infiltrate. There is no pneumothorax. Degenerative changes right shoulder. No pulmonary edema. Moderate gaseous distension of the stomach. IMPRESSION: Persistent streaky right basilar atelectasis or infiltrate. No pneumothorax. Degenerative changes right shoulder. Electronically Signed   By: Lahoma Crocker M.D.   On: 12/03/2014 15:58   Dg Chest Port 1 View  12/03/2014  CLINICAL DATA:  Found unresponsive on the floor, history of  breast cancer, hyponatremia EXAM: PORTABLE CHEST 1 VIEW COMPARISON:  08/21/2014 FINDINGS: Cardiomediastinal silhouette is unremarkable. There is streaky right basilar atelectasis or infiltrate. Moderate gaseous distension of the stomach. No pulmonary edema. IMPRESSION: Streaky right basilar atelectasis or infiltrate. Aspiration cannot be excluded. Moderate gastric distension of the stomach. No pulmonary edema. Electronically Signed   By: Lahoma Crocker M.D.   On: 12/03/2014 13:34    Microbiology: Recent Results (from the past 240 hour(s))  C difficile quick scan w PCR reflex     Status: None   Collection Time: 12/10/14 11:50 AM  Result Value Ref Range Status   C Diff antigen NEGATIVE NEGATIVE Final   C Diff toxin NEGATIVE NEGATIVE Final   C Diff interpretation Negative for toxigenic C. difficile  Final  Giardia/Cryptosporidium EIA     Status: None   Collection Time: 12/13/14 12:42 PM  Result Value Ref Range Status   Giardia Ag, Stl Negative Negative Final   Cryptosporidium EIA Negative Negative Final    Comment: (NOTE) Performed At: [facility] [place], [place] [id number] Lindon Romp MD [id number]    Source of Sample STOOL  Final     Labs: Basic  Metabolic Panel:  Recent Labs Lab 12/13/14 0510 12/14/14 0453 12/15/14 0516 12/16/14 0720 12/18/14 0652  NA 138 136 139 134* 136  K 4.2 4.2 4.1 4.3 4.6  CL 117* 110 110 103 113*  CO2 17* 18* 22 24 19*  GLUCOSE 82 77 81 83 98  BUN 9 7 7  5* 9  CREATININE 0.94 1.00 1.02* 1.03* 1.00  CALCIUM 6.5* 6.7* 6.7* 6.7* 6.7*   Liver Function Tests:  Recent Labs Lab 12/12/14 0415 12/13/14 0510 12/14/14 0453  AST 19 17 14*  ALT 22 18 14   ALKPHOS 146* 161* 155*  BILITOT 0.7 0.8 0.9  PROT 5.4* 5.3* 5.6*  ALBUMIN 2.1* 2.1* 2.1*   No results for input(s): LIPASE, AMYLASE in the last 168 hours. No results for input(s): AMMONIA in the last 168 hours. CBC:  Recent Labs Lab 12/14/14 0453 12/15/14 0516 12/16/14 0720 12/17/14 0640 12/18/14 0652  WBC 15.7* 10.7* 9.4 11.3* 10.9*  HGB 8.1* 7.3* 6.8* 10.9* 10.8*  HCT 24.5* 22.1* 20.2* 32.9* 32.5*  MCV 95.3 96.1 96.7 91.9 93.1  PLT 212 226 251 281 290   Cardiac Enzymes: No results for input(s): CKTOTAL, CKMB, CKMBINDEX, TROPONINI in the last 168 hours. BNP: BNP (last 3 results)  Recent Labs  03/04/14 0726 08/21/14 0029  BNP 1040.0* 1557.0*    ProBNP (last 3 results) No results for input(s): PROBNP in the last 8760 hours.  CBG:  Recent Labs Lab 12/17/14 0747 12/17/14 1127 12/17/14 1624 12/17/14 2211 12/18/14 0748  GLUCAP 81 98 95 76 91       Signed:  Gaege Sangalang  [facility] 12/18/2014, 11:10 AM

## 2014-12-18 NOTE — Care Management Important Message (Signed)
Important Message  Patient Details  Name: Catherine Mccullough MRN: [id number] Date of Birth: [birth year]   Medicare Important Message Given:  Yes    Sherald Barge, RN 12/18/2014, 11:24 AM

## 2014-12-18 NOTE — Plan of Care (Signed)
Problem: Physical Regulation: Goal: Ability to maintain clinical measurements within normal limits will improve Outcome: Progressing Pt did have a temp upon assessment of 101.1. Treated with Tylenol, temp came down to 99.7  Problem: Skin Integrity: Goal: Risk for impaired skin integrity will decrease Outcome: Progressing Pt has been turned and repositioned every two hours. Will continue to monitor pt's skin integrity.   Problem: Tissue Perfusion: Goal: Risk factors for ineffective tissue perfusion will decrease Outcome: Progressing SCDs in place for VTE.  Problem: Fluid Volume: Goal: Ability to maintain a balanced intake and output will improve Outcome: Progressing See I&O flowsheet.

## 2014-12-19 LAB — OVA + PARASITE EXAM

## 2014-12-19 LAB — O&P RESULT

## 2014-12-25 ENCOUNTER — Encounter: Payer: Self-pay | Admitting: Adult Health

## 2014-12-25 ENCOUNTER — Encounter: Payer: Medicare Other | Admitting: Adult Health

## 2014-12-25 DIAGNOSIS — R0989 Other specified symptoms and signs involving the circulatory and respiratory systems: Secondary | ICD-10-CM

## 2014-12-25 NOTE — Progress Notes (Signed)
Cardiology Office Note   ERROR  

## 2015-01-01 ENCOUNTER — Encounter (HOSPITAL_COMMUNITY): Payer: Self-pay | Admitting: *Deleted

## 2015-01-01 ENCOUNTER — Inpatient Hospital Stay (HOSPITAL_COMMUNITY)
Admission: EM | Admit: 2015-01-01 | Discharge: 2015-01-11 | DRG: 388 | Disposition: A | Discharge status: Skilled Nursing Facility | Payer: Medicare Other | Attending: Internal Medicine | Admitting: Internal Medicine

## 2015-01-01 ENCOUNTER — Emergency Department (HOSPITAL_COMMUNITY): Payer: Medicare Other

## 2015-01-01 DIAGNOSIS — Z8719 Personal history of other diseases of the digestive system: Secondary | ICD-10-CM | POA: Diagnosis not present

## 2015-01-01 DIAGNOSIS — E11649 Type 2 diabetes mellitus with hypoglycemia without coma: Secondary | ICD-10-CM | POA: Diagnosis not present

## 2015-01-01 DIAGNOSIS — Z7982 Long term (current) use of aspirin: Secondary | ICD-10-CM

## 2015-01-01 DIAGNOSIS — N184 Chronic kidney disease, stage 4 (severe): Secondary | ICD-10-CM | POA: Diagnosis present

## 2015-01-01 DIAGNOSIS — E1122 Type 2 diabetes mellitus with diabetic chronic kidney disease: Secondary | ICD-10-CM | POA: Diagnosis present

## 2015-01-01 DIAGNOSIS — K219 Gastro-esophageal reflux disease without esophagitis: Secondary | ICD-10-CM | POA: Diagnosis present

## 2015-01-01 DIAGNOSIS — Z8249 Family history of ischemic heart disease and other diseases of the circulatory system: Secondary | ICD-10-CM

## 2015-01-01 DIAGNOSIS — D638 Anemia in other chronic diseases classified elsewhere: Secondary | ICD-10-CM | POA: Diagnosis present

## 2015-01-01 DIAGNOSIS — E872 Acidosis: Secondary | ICD-10-CM | POA: Diagnosis present

## 2015-01-01 DIAGNOSIS — M1990 Unspecified osteoarthritis, unspecified site: Secondary | ICD-10-CM | POA: Diagnosis present

## 2015-01-01 DIAGNOSIS — K5660 Unspecified intestinal obstruction: Secondary | ICD-10-CM | POA: Diagnosis present

## 2015-01-01 DIAGNOSIS — E861 Hypovolemia: Secondary | ICD-10-CM | POA: Diagnosis present

## 2015-01-01 DIAGNOSIS — E860 Dehydration: Secondary | ICD-10-CM | POA: Diagnosis present

## 2015-01-01 DIAGNOSIS — C50919 Malignant neoplasm of unspecified site of unspecified female breast: Secondary | ICD-10-CM | POA: Diagnosis present

## 2015-01-01 DIAGNOSIS — Z66 Do not resuscitate: Secondary | ICD-10-CM | POA: Diagnosis present

## 2015-01-01 DIAGNOSIS — R112 Nausea with vomiting, unspecified: Secondary | ICD-10-CM | POA: Diagnosis present

## 2015-01-01 DIAGNOSIS — M797 Fibromyalgia: Secondary | ICD-10-CM | POA: Diagnosis present

## 2015-01-01 DIAGNOSIS — Z6824 Body mass index (BMI) 24.0-24.9, adult: Secondary | ICD-10-CM

## 2015-01-01 DIAGNOSIS — Z96653 Presence of artificial knee joint, bilateral: Secondary | ICD-10-CM | POA: Diagnosis present

## 2015-01-01 DIAGNOSIS — Z9049 Acquired absence of other specified parts of digestive tract: Secondary | ICD-10-CM | POA: Diagnosis not present

## 2015-01-01 DIAGNOSIS — I5022 Chronic systolic (congestive) heart failure: Secondary | ICD-10-CM | POA: Diagnosis present

## 2015-01-01 DIAGNOSIS — Z8711 Personal history of peptic ulcer disease: Secondary | ICD-10-CM

## 2015-01-01 DIAGNOSIS — M129 Arthropathy, unspecified: Secondary | ICD-10-CM | POA: Diagnosis present

## 2015-01-01 DIAGNOSIS — I10 Essential (primary) hypertension: Secondary | ICD-10-CM | POA: Diagnosis present

## 2015-01-01 DIAGNOSIS — D649 Anemia, unspecified: Secondary | ICD-10-CM | POA: Diagnosis not present

## 2015-01-01 DIAGNOSIS — I50810 Right heart failure, unspecified: Secondary | ICD-10-CM

## 2015-01-01 DIAGNOSIS — Z90710 Acquired absence of both cervix and uterus: Secondary | ICD-10-CM

## 2015-01-01 DIAGNOSIS — E162 Hypoglycemia, unspecified: Secondary | ICD-10-CM | POA: Diagnosis present

## 2015-01-01 DIAGNOSIS — G40909 Epilepsy, unspecified, not intractable, without status epilepticus: Secondary | ICD-10-CM | POA: Diagnosis present

## 2015-01-01 DIAGNOSIS — K5669 Other intestinal obstruction: Secondary | ICD-10-CM

## 2015-01-01 DIAGNOSIS — I130 Hypertensive heart and chronic kidney disease with heart failure and stage 1 through stage 4 chronic kidney disease, or unspecified chronic kidney disease: Secondary | ICD-10-CM | POA: Diagnosis present

## 2015-01-01 DIAGNOSIS — E43 Unspecified severe protein-calorie malnutrition: Secondary | ICD-10-CM | POA: Diagnosis present

## 2015-01-01 DIAGNOSIS — Z833 Family history of diabetes mellitus: Secondary | ICD-10-CM | POA: Diagnosis not present

## 2015-01-01 DIAGNOSIS — D72829 Elevated white blood cell count, unspecified: Secondary | ICD-10-CM | POA: Diagnosis present

## 2015-01-01 DIAGNOSIS — K56609 Unspecified intestinal obstruction, unspecified as to partial versus complete obstruction: Secondary | ICD-10-CM | POA: Diagnosis present

## 2015-01-01 DIAGNOSIS — C7951 Secondary malignant neoplasm of bone: Secondary | ICD-10-CM | POA: Diagnosis present

## 2015-01-01 DIAGNOSIS — Z809 Family history of malignant neoplasm, unspecified: Secondary | ICD-10-CM

## 2015-01-01 DIAGNOSIS — R109 Unspecified abdominal pain: Secondary | ICD-10-CM

## 2015-01-01 LAB — COMPREHENSIVE METABOLIC PANEL
ALT: 9 U/L — ABNORMAL LOW (ref 14–54)
ANION GAP: 14 (ref 5–15)
AST: 18 U/L (ref 15–41)
Albumin: 3.1 g/dL — ABNORMAL LOW (ref 3.5–5.0)
Alkaline Phosphatase: 194 U/L — ABNORMAL HIGH (ref 38–126)
BUN: 22 mg/dL — ABNORMAL HIGH (ref 6–20)
CALCIUM: 10 mg/dL (ref 8.9–10.3)
CHLORIDE: 105 mmol/L (ref 101–111)
CO2: 21 mmol/L — AB (ref 22–32)
Creatinine, Ser: 1.11 mg/dL — ABNORMAL HIGH (ref 0.44–1.00)
GFR, EST AFRICAN AMERICAN: 58 mL/min — AB (ref >60)
GFR, EST NON AFRICAN AMERICAN: 50 mL/min — AB (ref >60)
Glucose, Bld: 114 mg/dL — ABNORMAL HIGH (ref 65–99)
Potassium: 3.6 mmol/L (ref 3.5–5.1)
SODIUM: 140 mmol/L (ref 135–145)
Total Bilirubin: 0.9 mg/dL (ref 0.3–1.2)
Total Protein: 8.4 g/dL — ABNORMAL HIGH (ref 6.5–8.1)

## 2015-01-01 LAB — CBC WITH DIFFERENTIAL/PLATELET
BASOS ABS: 0 10*3/uL (ref 0.0–0.1)
Basophils Relative: 0 %
Eosinophils Absolute: 0 10*3/uL (ref 0.0–0.7)
Eosinophils Relative: 0 %
HEMATOCRIT: 37.2 % (ref 36.0–46.0)
HEMOGLOBIN: 12.1 g/dL (ref 12.0–15.0)
LYMPHS ABS: 1.4 10*3/uL (ref 0.7–4.0)
LYMPHS PCT: 7 %
MCH: 30.6 pg (ref 26.0–34.0)
MCHC: 32.5 g/dL (ref 30.0–36.0)
MCV: 93.9 fL (ref 78.0–100.0)
Monocytes Absolute: 0.6 10*3/uL (ref 0.1–1.0)
Monocytes Relative: 3 %
NEUTROS ABS: 17.4 10*3/uL — AB (ref 1.7–7.7)
NEUTROS PCT: 90 %
PLATELETS: 390 10*3/uL (ref 150–400)
RBC: 3.96 MIL/uL (ref 3.87–5.11)
RDW: 18.1 % — ABNORMAL HIGH (ref 11.5–15.5)
WBC: 19.4 10*3/uL — AB (ref 4.0–10.5)

## 2015-01-01 LAB — LACTIC ACID, PLASMA: LACTIC ACID, VENOUS: 0.9 mmol/L (ref 0.5–2.0)

## 2015-01-01 MED ORDER — ONDANSETRON HCL 4 MG/2ML IJ SOLN
4.0000 mg | Freq: Four times a day (QID) | INTRAMUSCULAR | Status: DC | PRN
  Administered 2015-01-05 – 2015-01-07 (×4): 4 mg via INTRAVENOUS
  Filled 2015-01-01 (×6): qty 2

## 2015-01-01 MED ORDER — PANTOPRAZOLE SODIUM 40 MG IV SOLR
40.0000 mg | INTRAVENOUS | Status: DC
  Administered 2015-01-02 – 2015-01-10 (×8): 40 mg via INTRAVENOUS
  Filled 2015-01-01 (×9): qty 40

## 2015-01-01 MED ORDER — MORPHINE SULFATE (PF) 4 MG/ML IV SOLN
4.0000 mg | INTRAVENOUS | Status: AC | PRN
  Administered 2015-01-01 – 2015-01-02 (×3): 4 mg via INTRAVENOUS
  Filled 2015-01-01 (×4): qty 1

## 2015-01-01 MED ORDER — ALPRAZOLAM 0.25 MG PO TABS
0.2500 mg | ORAL_TABLET | Freq: Two times a day (BID) | ORAL | Status: DC | PRN
  Administered 2015-01-02: 0.25 mg via ORAL
  Filled 2015-01-01 (×2): qty 1

## 2015-01-01 MED ORDER — ACETAMINOPHEN 325 MG PO TABS: 650.0000 mg | ORAL_TABLET | ORAL | Status: DC | PRN

## 2015-01-01 MED ORDER — SODIUM CHLORIDE 0.9 % IV SOLN
Freq: Once | INTRAVENOUS | Status: AC
  Administered 2015-01-01: 23:00:00 via INTRAVENOUS

## 2015-01-01 MED ORDER — METOPROLOL TARTRATE 1 MG/ML IV SOLN
5.0000 mg | Freq: Four times a day (QID) | INTRAVENOUS | Status: DC
  Administered 2015-01-02 – 2015-01-10 (×34): 5 mg via INTRAVENOUS
  Filled 2015-01-01 (×36): qty 5

## 2015-01-01 MED ORDER — MORPHINE SULFATE (PF) 2 MG/ML IV SOLN
2.0000 mg | INTRAVENOUS | Status: DC | PRN
  Administered 2015-01-02 – 2015-01-03 (×10): 2 mg via INTRAVENOUS
  Filled 2015-01-01 (×11): qty 1

## 2015-01-01 MED ORDER — SODIUM CHLORIDE 0.9 % IV SOLN
50.0000 mg | Freq: Two times a day (BID) | INTRAVENOUS | Status: DC
  Administered 2015-01-02 – 2015-01-10 (×17): 50 mg via INTRAVENOUS
  Filled 2015-01-01 (×20): qty 5

## 2015-01-01 MED ORDER — LIDOCAINE HCL 2 % EX GEL
CUTANEOUS | Status: AC
  Administered 2015-01-01: 10
  Filled 2015-01-01: qty 10

## 2015-01-01 MED ORDER — SODIUM CHLORIDE 0.9 % IJ SOLN: 3.0000 mL | INTRAMUSCULAR | Status: DC | PRN

## 2015-01-01 MED ORDER — DICLOFENAC SODIUM 1 % TD GEL
2.0000 g | Freq: Three times a day (TID) | TRANSDERMAL | Status: DC | PRN
  Filled 2015-01-01: qty 100

## 2015-01-01 MED ORDER — LACOSAMIDE 200 MG/20ML IV SOLN
INTRAVENOUS | Status: AC
  Filled 2015-01-01: qty 20

## 2015-01-01 MED ORDER — HEPARIN SODIUM (PORCINE) 5000 UNIT/ML IJ SOLN
5000.0000 [IU] | Freq: Three times a day (TID) | INTRAMUSCULAR | Status: DC
  Administered 2015-01-02 – 2015-01-10 (×27): 5000 [IU] via SUBCUTANEOUS
  Filled 2015-01-01 (×27): qty 1

## 2015-01-01 MED ORDER — ONDANSETRON HCL 4 MG/2ML IJ SOLN
4.0000 mg | Freq: Once | INTRAMUSCULAR | Status: AC
  Administered 2015-01-01: 4 mg via INTRAVENOUS
  Filled 2015-01-01: qty 2

## 2015-01-01 MED ORDER — SODIUM CHLORIDE 0.9 % IJ SOLN
3.0000 mL | Freq: Two times a day (BID) | INTRAMUSCULAR | Status: DC
Start: 2015-01-01 — End: 2015-01-10
  Administered 2015-01-02 – 2015-01-08 (×7): 3 mL via INTRAVENOUS

## 2015-01-01 MED ORDER — HYDRALAZINE HCL 20 MG/ML IJ SOLN: 5.0000 mg | INTRAMUSCULAR | Status: DC | PRN

## 2015-01-01 MED ORDER — SODIUM CHLORIDE 0.9 % IV SOLN: 250.0000 mL | INTRAVENOUS | Status: DC | PRN

## 2015-01-01 NOTE — H&P (Signed)
[facility] History and Physical  Catherine Mccullough DOB: 08-07-1945 DOA: 01/01/2015  Referring physician: ED physician PCP: Default, Provider, MD  Specialists:   Chief Complaint: Abd pain, nausea, vomiting  HPI: Catherine Mccullough is a 69 y.o. 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  She was in her usual state the morning of her admission and had a regular bowel movement.  As the morning progressed, she developed nausea and generalized abdominal pain.  She had loss of appetite, and later had one episode of non-bloody, nonbilious emesis. Symptoms continued to worsen and she was transported to the ED for further evaluation.  She reports continued passage of flatus and denies fever, chills, diarrhea, melena, or hematochezia.   In ED, patient was found to be afebrile with vital signs stable. Initial blood work was notable for leukocytosis to 19 thousand, and chronic metabolic acidosis and CKD 3 were redemonstrated. CT abdomen and pelvis was obtained, demonstrating an SBO with transition point in the distal ileum. Also noted is a stricture at the proximal transverse colon and cecal distension.  Incidental notation was made of coarse calcifications in an atrophic pancreas. Nausea and vomiting was refractory to antiemetics in the ED and the hospitalists were called to admit.   Where does patient live? SNF   Can patient participate in ADLs? Some   Review of Systems:   General: no fevers, chills, sweats, weight change, or fatigue. Loss of appetite HEENT: no blurry vision, hearing changes or sore throat Pulm: no dyspnea, cough, or wheeze CV: no chest pain or palpitations Abd: nausea, vomiting, abdominal pain, no diarrhea, or constipation. No hematemesis, melena, or hematochezia GU:  no dysuria, hematuria, increased urinary frequency, or urgency  Ext: no leg edema Neuro: no focal weakness, numbness, or tingling, no vision change or hearing loss Skin: no rash, no wounds MSK: No muscle spasm, no deformity, no red, hot, or swollen joint Heme: No easy bruising or bleeding Travel history: No recent long distant travel    Allergy:  Allergies  Allergen Reactions  . Mometasone Shortness Of Breath  . Vancomycin Itching  . Aspirin Other (See Comments)    "Inflames stomach"  . Contrast Media [Iodinated Diagnostic Agents] Other (See Comments)    Made Heart Stop.   . Cortisone Other (See Comments)    Hold fluid  . Doxycycline Nausea And Vomiting  . Ibuprofen Nausea And Vomiting  . Insulins Other (See Comments)    Pt says even the tiniest bit of insulin makes her go unconscious because her BS gets too low  . Lasix [Furosemide] Other (See Comments)    Paradoxical Response  . Other     Iv bp med unknown.and adhesive tape-silicones  . Prednisone     Sweating   . Sulfamethoxazole Other (See Comments)    Bottomed out platelets  . Tape   . Ultram [Tramadol Hcl] Other (See Comments)    "Grand mal seizure"  . Codeine Rash  . Dilantin [Phenytoin Sodium] Rash  . Latex Rash  . Piperacillin Sod-Tazobactam So Rash    Past Medical History  Diagnosis Date  . Kidney disease   . Hypertension   . Diabetes mellitus   . Cancer ([place])     brest met to bone  . Arthritis   . CHF (congestive heart failure) ([place])   .  Lymphedema of leg   . Anxiety   . Pulmonary hypertension ([place])   . 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 Pancreatitis   . Depression   . Anemia   . Shingles   . PUD (peptic ulcer disease)   . GERD (gastroesophageal reflux disease)   . MRSA (methicillin resistant staph aureus) culture positive   . Fibromyalgia   . Acute respiratory failure with hypoxia ([place]) 11/2014    Intubated and extubated, presumed to be secondary to status  epilepticus.  . Status epilepticus ([place]) 11/2014  . Acute blood loss anemia 11/2014  . Mallory-Weiss tear 11/2014  . Acute systolic congestive heart failure ([place]) 12/13/2014  . Metabolic acidosis 24/0/9735  . Diarrhea 12/13/2014  . Rotator cuff tear arthropathy of right shoulder 05/29/2011    Past Surgical History  Procedure Laterality Date  . Breast surgery    . Cholecystectomy    . Abdominal hysterectomy    . Replacement total knee bilateral    . Hand tendon surgery    . Esophagogastroduodenoscopy N/A 12/03/2014    Procedure: ESOPHAGOGASTRODUODENOSCOPY (EGD);  Surgeon: Danie Binder, MD;  Location: [facility];  Service: Endoscopy;  Laterality: N/A;    Social History:  reports that she has never smoked. She has never used smokeless tobacco. She reports that she does not drink alcohol or use illicit drugs.  Family History:  Family History  Problem Relation Age of Onset  . Cancer Paternal Grandfather   . Diabetes Sister   . Diabetes Sister   . Heart disease Maternal Grandmother   . Heart disease Father   . Hyperlipidemia       Prior to Admission medications   Medication Sig Start Date End Date Taking? Authorizing Provider  bumetanide (BUMEX) 1 MG tablet Take 1 mg by mouth. 12/20/14  Yes Historical Provider, MD  aspirin EC 81 MG tablet Take 81 mg by mouth daily.    Historical Provider, MD  bisacodyl (DULCOLAX) 5 MG EC tablet Take 5 mg by mouth daily as needed for moderate constipation.    Historical Provider, MD  bumetanide (BUMEX) 1 MG tablet Take 1 mg by mouth daily.     Historical Provider, MD  calcitRIOL (ROCALTROL) 0.25 MCG capsule Take 0.25 mcg by mouth every other day.    Historical Provider, MD  cetirizine (ZYRTEC) 10 MG tablet Take 5 mg by mouth daily as needed for allergies.    Historical Provider, MD  ciprofloxacin (CIPRO) 500 MG tablet Take 1 tablet (500 mg total) by mouth 2 (two) times daily. Antibiotic to be taken for 3 more days. 12/18/14   Rexene Alberts, MD   diclofenac sodium (VOLTAREN) 1 % GEL Apply 2 g topically 3 (three) times daily as needed (Applied to right shoulder as needed for pain). 12/18/14   Rexene Alberts, MD  esomeprazole (NEXIUM) 40 MG capsule Take 1 capsule (40 mg total) by mouth daily before breakfast. 12/18/14   Rexene Alberts, MD  fluticasone [facility]) 50 MCG/ACT nasal spray Place 1 spray into both nostrils daily as needed for allergies.  03/24/14   Historical Provider, MD  lacosamide (VIMPAT) 50 MG TABS tablet Take 1 tablet (50 mg total) by mouth 2 (two) times daily. 12/18/14   Rexene Alberts, MD  losartan (COZAAR) 25 MG tablet Take 1 tablet (25 mg total) by mouth daily. Medication for your heart 12/18/14   Rexene Alberts, MD  metoprolol succinate (TOPROL-XL) 100 MG 24 hr tablet Take 1 tablet (100  mg total) by mouth daily. Take with or immediately following a meal. 12/18/14   Rexene Alberts, MD  metroNIDAZOLE (FLAGYL) 250 MG tablet Take 1 tablet (250 mg total) by mouth every 8 (eight) hours. Take for 3 more days for your diarrhea. 12/18/14   Rexene Alberts, MD  montelukast (SINGULAIR) 10 MG tablet Take 10 mg by mouth at bedtime.      Historical Provider, MD  Multiple Vitamins-Minerals (MULTIVITAMINS THER. W/MINERALS) TABS Take 1 tablet by mouth daily.      Historical Provider, MD  PROAIR HFA 108 (90 BASE) MCG/ACT inhaler Take 2 puffs by mouth every 6 (six) hours as needed. 04/11/14   Historical Provider, MD  sodium bicarbonate 650 MG tablet Take 1 tablet (650 mg total) by mouth 2 (two) times daily. 12/18/14   Rexene Alberts, MD    Physical Exam: Filed Vitals:   01/01/15 1952 01/01/15 2230  BP: 158/49 158/96  Pulse: 100 110  Temp: 98.6 F (37 C)   TempSrc: Oral   Resp: 20 15  SpO2: 97% 97%   General: Not in acute distress HEENT:       Eyes: PERRL, EOMI, no scleral icterus or conjunctival pallor. Lt eye with small scleral hemmorrhage       ENT: No discharge from the ears or nose, no pharyngeal ulcers, petechiae or exudate. NGT in  place with bilious output       Neck: No JVD, no bruit, no appreciable mass Heme: No cervical adenopathy, no pallor Cardiac: Rate ~80 and regular, grade III systolic-diastolic murmur throughout precordium. Pulm: Good air movement bilaterally. No rales, wheezing, rhonchi or rubs. Abd: Soft, nondistended, tender throughout, no rebound pain or gaurding, no mass or organomegaly, BS not appreciated . Ext: No LE edema bilaterally. 2+DP/PT pulse bilaterally. Musculoskeletal: No gross deformity, no red, hot, swollen joints, no limitation in ROM  Skin: No rashes or wounds on exposed surfaces  Neuro: Alert, oriented X3, cranial nerves II-XII grossly intact. No focal findings Psych: Patient is not overtly psychotic.  Labs on Admission:  Basic Metabolic Panel:  Recent Labs Lab 01/01/15 2032  NA 140  K 3.6  CL 105  CO2 21*  GLUCOSE 114*  BUN 22*  CREATININE 1.11*  CALCIUM 10.0   Liver Function Tests:  Recent Labs Lab 01/01/15 2032  AST 18  ALT 9*  ALKPHOS 194*  BILITOT 0.9  PROT 8.4*  ALBUMIN 3.1*   No results for input(s): LIPASE, AMYLASE in the last 168 hours. No results for input(s): AMMONIA in the last 168 hours. CBC:  Recent Labs Lab 01/01/15 2032  WBC 19.4*  NEUTROABS 17.4*  HGB 12.1  HCT 37.2  MCV 93.9  PLT 390   Cardiac Enzymes: No results for input(s): CKTOTAL, CKMB, CKMBINDEX, TROPONINI in the last 168 hours.  BNP (last 3 results)  Recent Labs  03/04/14 0726 08/21/14 0029  BNP 1040.0* 1557.0*    ProBNP (last 3 results) No results for input(s): PROBNP in the last 8760 hours.  CBG: No results for input(s): GLUCAP in the last 168 hours.  Radiological Exams on Admission: Ct Renal Stone Study  01/01/2015  CLINICAL DATA:  Generalized abdominal pain and vomiting for 1 day. EXAM: CT ABDOMEN AND PELVIS WITHOUT CONTRAST TECHNIQUE: Multidetector CT imaging of the abdomen and pelvis was performed following the standard protocol without IV contrast.  COMPARISON:  12/11/2014 FINDINGS: Lower chest: Small right pleural effusion. Atherosclerotic disease of the coronary arteries. Hepatobiliary: No mass visualized on this un-enhanced exam. Status post cholecystectomy.  Pancreas: The pancreas is atrophic. Coarse calcifications are seen within the head of the pancreas or within the peripancreatic fat. Spleen: Within normal limits in size. Adrenals/Urinary Tract: No evidence of urolithiasis or hydronephrosis. No definite mass visualized on this un-enhanced exam. Right kidney is atrophic. Stomach/Bowel: There is a diffuse fluid distention of small bowel loops to the level of the terminal ileum with maximum diameter of 37 mm. The transitional point appears to be at the level of the distal ileum in the right lower quadrant, and with there are inflammatory mesenteric changes. The cecum is distended. The transverse and left colon and decompressed. Un apparent abrupt narrowing is seen at the level of the proximal transverse colon comminuted transitional point of small-bowel obstruction. Vascular/Lymphatic: No pathologically enlarged lymph nodes. No evidence of abdominal aortic aneurysm. Atherosclerotic disease of the aorta is seen. Reproductive: No mass or other significant abnormality. Other: Moderate amount of abdominal ascites.  Anasarca. Musculoskeletal: S shaped scoliosis with secondary osteoarthritic changes. Mottled appear over a rounds of L2 vertebral body may be metastatic. IMPRESSION: Small bowel obstruction with transitional point at the distal ileum. In cross proximity, there is also an apparent narrowing of the proximal transverse colon causing enlargement of the cecum. Ascites. Coarse calcifications within an atrophic appearing head of the pancreas. This may represent a chronic pancreatitis, or low grade pancreatic malignancy. This could be further evaluated with MRI when found clinically appropriate. Mottled appearance of L2 vertebral body, which may represent a  metastatic lesion. Anasarca. Small right pleural effusion. Electronically Signed   By: Fidela Salisbury M.D.   On: 01/01/2015 21:31    EKG: Independently reviewed.  Abnormal findings: Sinus with non-specific Tw change anteriorly    Assessment/Plan  1. SBO  - Confirmed on CT, with transition point in distal ilium; also noted is a stricture at the proximal transverse colon causing distension of the cecum  - NGT placed to suction, draining bilious fluid  - Continue antiemetics prn  - Keep strict NPO for now  - Monitor on telemetry  - If fails to resolve over the next couple days, will need a surgical consultation, though pt is poor surgical candidate d/t comorbidities   2. Dehydration  - Bolused 1L NS in ED  - Will hold off on additional IVF for now given CHF with EF 25%   - Holding diuretics  - Following fluid balance closely as below    3. Chronic systolic CHF (EF 38%, 46/65/9935)  - Was clinically dry on admit in setting of SBO with N/V  - Bolused 1L NS in ED  - Holding diuretics now  - Following strict I/Os, daily wts - May require additional IVF if SBO fails to resolve and pt continues NPO  4. CKD 3-4  - Stable, check chemistries in am  - Following fluid status closely  - Avoiding nephrotoxins as feasible   5. Leukocytosis  - Suspect this is reactive to #1, as no other suggestion of active infection  - Lactic acid staying wnl, making bowel ischemia unlikely at this time  - Monitoring, repeat CBC in am    6. Seizure disorder   - [name] on Vimpat at home  - Converted to IV while NPO    DVT ppx: SQ Heparin   Code Status: DNR Family Communication: None at bed side.    Disposition Plan: Admit to inpatient   Date of Service 01/01/2015    Vianne Bulls, MD [facility] Pager [phone]  If 7PM-7AM, please contact  night-coverage [email or web address] Password [email or web address] 01/01/2015, 11:01 PM

## 2015-01-01 NOTE — ED Provider Notes (Addendum)
CSN: [id number]     Arrival date & time 01/01/15  1944 History   First MD Initiated Contact with Patient 01/01/15 1946     Chief Complaint  Patient presents with  . Emesis      HPI  Patient present for evaluation of nausea, vomiting, abdominal pain since this morning. Diffuse pain. Heme-negative nonbilious emesis. Had a bowel movement earlier today. Otherwise had been 2 days. No fevers or chills. No difficulty breathing. No episodes of confusion. Patient is residing at [facility] where she is convalescing after recent hospitalization and was quite complicated including the below problem list.   Discharge Diagnoses:  1. Acute encephalopathy, thought to be secondary to status epilepticus. 2. Acute respiratory failure with hypoxia and hypercarbia. Status post intubation and ventilation; successful extubation. 3. Paroxysmal atrial fibrillation with RVR. 4. Acute systolic congestive heart failure. EF was 25% per echo. 5. Questionable/presumed sepsis, culture negative. 6. Acute kidney injury superimposed on stage III chronic kidney disease. 7. Upper GI bleeding with acute blood loss anemia. -EGD on 11/27 showed a Mallory-Weiss tear and erosive gastritis; 2 clips were placed. 8. Thrombocytopenia, etiology likely multifactorial. 9. Metabolic acidosis, likely chronic. 10. Hypocalcemia secondary to hypoalbuminemia. 11. Diet-controlled diabetes mellitus. 12. Acute on chronic diarrhea. 13. Metastatic breast cancer. Followed by oncology in [place]. 14. Chronic right rotator cuff tear/ arthropathy. 15. DO NOT RESUSCITATE.   She denies fever or shortness of breath. No recent seizures. Still making urine without dysuria.  Abdominal surgical history includes cholecystectomy, and total abdominal hysterectomy. History of breast cancer, bilateral mastectomies.  Past Medical History  Diagnosis Date  . Kidney disease   . Hypertension   . Diabetes mellitus   . Cancer ([place])     brest met  to bone  . Arthritis   . CHF (congestive heart failure) ([place])   . Lymphedema of leg   . Anxiety   . Pulmonary hypertension ([place])   . 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 Pancreatitis   . Depression   . Anemia   . Shingles   . PUD (peptic ulcer disease)   . GERD (gastroesophageal reflux disease)   . MRSA (methicillin resistant staph aureus) culture positive   . Fibromyalgia   . Acute respiratory failure with hypoxia ([place]) 11/2014    Intubated and extubated, presumed to be secondary to status epilepticus.  . Status epilepticus ([place]) 11/2014  . Acute blood loss anemia 11/2014  . Mallory-Weiss tear 11/2014  . Acute systolic congestive heart failure ([place]) 12/13/2014  . Metabolic acidosis 44/0/1027  . Diarrhea 12/13/2014  . Rotator cuff tear arthropathy of right shoulder 05/29/2011   Past Surgical History  Procedure Laterality Date  . Breast surgery    . Cholecystectomy    . Abdominal hysterectomy    . Replacement total knee bilateral    . Hand tendon surgery    . Esophagogastroduodenoscopy N/A 12/03/2014    Procedure: ESOPHAGOGASTRODUODENOSCOPY (EGD);  Surgeon: Danie Binder, MD;  Location: [facility];  Service: Endoscopy;  Laterality: N/A;   Family History  Problem Relation Age of Onset  . Cancer Paternal Grandfather   . Diabetes Sister   . Diabetes Sister   . Heart disease Maternal Grandmother   . Heart disease Father   . Hyperlipidemia     Social History  Substance Use Topics  . Smoking status: Never Smoker   . Smokeless tobacco: Never Used  . Alcohol Use: No   OB History  No data available     Review of Systems  Constitutional: Negative for fever, chills, diaphoresis, appetite change and fatigue.  HENT: Negative for mouth sores, sore throat and trouble swallowing.   Eyes: Negative for visual disturbance.  Respiratory: Negative for cough, chest tightness, shortness of breath and wheezing.   Cardiovascular: Negative for chest pain.   Gastrointestinal: Positive for nausea, vomiting and abdominal pain. Negative for diarrhea and abdominal distention.  Endocrine: Negative for polydipsia, polyphagia and polyuria.  Genitourinary: Negative for dysuria, frequency and hematuria.  Musculoskeletal: Negative for gait problem.  Skin: Negative for color change, pallor and rash.  Neurological: Negative for dizziness, syncope, light-headedness and headaches.  Hematological: Does not bruise/bleed easily.  Psychiatric/Behavioral: Negative for behavioral problems and confusion.      Allergies  Mometasone; Vancomycin; Aspirin; Contrast media; Cortisone; Doxycycline; Ibuprofen; Insulins; Lasix; Other; Prednisone; Sulfamethoxazole; Tape; Ultram; Codeine; Dilantin; Latex; and Piperacillin sod-tazobactam so  Home Medications   Prior to Admission medications   Medication Sig Start Date End Date Taking? Authorizing Provider  aspirin EC 81 MG tablet Take 81 mg by mouth daily.    Historical Provider, MD  bisacodyl (DULCOLAX) 5 MG EC tablet Take 5 mg by mouth daily as needed for moderate constipation.    Historical Provider, MD  bumetanide (BUMEX) 1 MG tablet Take 1 mg by mouth daily.     Historical Provider, MD  calcitRIOL (ROCALTROL) 0.25 MCG capsule Take 0.25 mcg by mouth every other day.    Historical Provider, MD  cetirizine (ZYRTEC) 10 MG tablet Take 5 mg by mouth daily as needed for allergies.    Historical Provider, MD  ciprofloxacin (CIPRO) 500 MG tablet Take 1 tablet (500 mg total) by mouth 2 (two) times daily. Antibiotic to be taken for 3 more days. 12/18/14   Rexene Alberts, MD  diclofenac sodium (VOLTAREN) 1 % GEL Apply 2 g topically 3 (three) times daily as needed (Applied to right shoulder as needed for pain). 12/18/14   Rexene Alberts, MD  esomeprazole (NEXIUM) 40 MG capsule Take 1 capsule (40 mg total) by mouth daily before breakfast. 12/18/14   Rexene Alberts, MD  fluticasone [facility]) 50 MCG/ACT nasal spray Place 1 spray into  both nostrils daily as needed for allergies.  03/24/14   Historical Provider, MD  lacosamide (VIMPAT) 50 MG TABS tablet Take 1 tablet (50 mg total) by mouth 2 (two) times daily. 12/18/14   Rexene Alberts, MD  losartan (COZAAR) 25 MG tablet Take 1 tablet (25 mg total) by mouth daily. Medication for your heart 12/18/14   Rexene Alberts, MD  metoprolol succinate (TOPROL-XL) 100 MG 24 hr tablet Take 1 tablet (100 mg total) by mouth daily. Take with or immediately following a meal. 12/18/14   Rexene Alberts, MD  metroNIDAZOLE (FLAGYL) 250 MG tablet Take 1 tablet (250 mg total) by mouth every 8 (eight) hours. Take for 3 more days for your diarrhea. 12/18/14   Rexene Alberts, MD  montelukast (SINGULAIR) 10 MG tablet Take 10 mg by mouth at bedtime.      Historical Provider, MD  Multiple Vitamins-Minerals (MULTIVITAMINS THER. W/MINERALS) TABS Take 1 tablet by mouth daily.      Historical Provider, MD  PROAIR HFA 108 (90 BASE) MCG/ACT inhaler Take 2 puffs by mouth every 6 (six) hours as needed. 04/11/14   Historical Provider, MD  sodium bicarbonate 650 MG tablet Take 1 tablet (650 mg total) by mouth 2 (two) times daily. 12/18/14   Rexene Alberts, MD   BP 158/49  mmHg  Pulse 100  Temp(Src) 98.6 F (37 C) (Oral)  Resp 20  SpO2 97% Physical Exam  Constitutional: She is oriented to person, place, and time. She appears well-developed and well-nourished. No distress.  HENT:  Head: Normocephalic.  Eyes: Conjunctivae are normal. Pupils are equal, round, and reactive to light. No scleral icterus.  Neck: Normal range of motion. Neck supple. No thyromegaly present.  Cardiovascular: Normal rate and regular rhythm.  Exam reveals no gallop and no friction rub.   No murmur heard. Pulmonary/Chest: Effort normal and breath sounds normal. No respiratory distress. She has no wheezes. She has no rales.  Abdominal: Soft. She exhibits no distension. There is tenderness. There is no rebound.  Soft. Hypoactive bowel sounds. No  guarding or rebound. Diffuse tenderness without peritoneal irritation  Musculoskeletal: Normal range of motion.  Neurological: She is alert and oriented to person, place, and time.  Skin: Skin is warm and dry. No rash noted.  Psychiatric: She has a normal mood and affect. Her behavior is normal.    ED Course  Procedures (including critical care time) Labs Review Labs Reviewed  CBC WITH DIFFERENTIAL/PLATELET - Abnormal; Notable for the following:    WBC 19.4 (*)    RDW 18.1 (*)    Neutro Abs 17.4 (*)    All other components within normal limits  COMPREHENSIVE METABOLIC PANEL - Abnormal; Notable for the following:    CO2 21 (*)    Glucose, Bld 114 (*)    BUN 22 (*)    Creatinine, Ser 1.11 (*)    Total Protein 8.4 (*)    Albumin 3.1 (*)    ALT 9 (*)    Alkaline Phosphatase 194 (*)    GFR calc non Af Amer 50 (*)    GFR calc Af Amer 58 (*)    All other components within normal limits  URINE CULTURE  LACTIC ACID, PLASMA  URINALYSIS, ROUTINE W REFLEX MICROSCOPIC (NOT AT [facility])  LACTIC ACID, PLASMA    Imaging Review Ct Renal Stone Study  01/01/2015  CLINICAL DATA:  Generalized abdominal pain and vomiting for 1 day. EXAM: CT ABDOMEN AND PELVIS WITHOUT CONTRAST TECHNIQUE: Multidetector CT imaging of the abdomen and pelvis was performed following the standard protocol without IV contrast. COMPARISON:  12/11/2014 FINDINGS: Lower chest: Small right pleural effusion. Atherosclerotic disease of the coronary arteries. Hepatobiliary: No mass visualized on this un-enhanced exam. Status post cholecystectomy. Pancreas: The pancreas is atrophic. Coarse calcifications are seen within the head of the pancreas or within the peripancreatic fat. Spleen: Within normal limits in size. Adrenals/Urinary Tract: No evidence of urolithiasis or hydronephrosis. No definite mass visualized on this un-enhanced exam. Right kidney is atrophic. Stomach/Bowel: There is a diffuse fluid distention of small bowel loops to  the level of the terminal ileum with maximum diameter of 37 mm. The transitional point appears to be at the level of the distal ileum in the right lower quadrant, and with there are inflammatory mesenteric changes. The cecum is distended. The transverse and left colon and decompressed. Un apparent abrupt narrowing is seen at the level of the proximal transverse colon comminuted transitional point of small-bowel obstruction. Vascular/Lymphatic: No pathologically enlarged lymph nodes. No evidence of abdominal aortic aneurysm. Atherosclerotic disease of the aorta is seen. Reproductive: No mass or other significant abnormality. Other: Moderate amount of abdominal ascites.  Anasarca. Musculoskeletal: S shaped scoliosis with secondary osteoarthritic changes. Mottled appear over a rounds of L2 vertebral body may be metastatic. IMPRESSION: Small bowel obstruction  with transitional point at the distal ileum. In cross proximity, there is also an apparent narrowing of the proximal transverse colon causing enlargement of the cecum. Ascites. Coarse calcifications within an atrophic appearing head of the pancreas. This may represent a chronic pancreatitis, or low grade pancreatic malignancy. This could be further evaluated with MRI when found clinically appropriate. Mottled appearance of L2 vertebral body, which may represent a metastatic lesion. Anasarca. Small right pleural effusion. Electronically Signed   By: Fidela Salisbury M.D.   On: 01/01/2015 21:31   I have personally reviewed and evaluated these images and lab results as part of my medical decision-making.   EKG Interpretation None      MDM   Final diagnoses:  Small bowel obstruction (HCC)  Non-intractable vomiting with nausea, vomiting of unspecified type    Patient was refractory vomiting after antiemetics. CT scan shows small bowel obstruction with transition point at the ileum. Inflammatory changes of the mesentery noted. Also stricture at the  proximal transverse colon with distention of the cecum. Calcifications with atrophic pancreas differential including colic pancreatitis, versus  Malignancy.  Overall, patient looks surprisingly well for having a small bowel obstruction both clinically, and per her laboratory  considering her age, and a recent complicated hospitalization.  NG tube is being placed. I'll speak with hospitalist regarding disposition.     Tanna Furry, MD 01/01/15 2146  Tanna Furry, MD 01/01/15 4287  Tanna Furry, MD 01/12/15 [phone]

## 2015-01-01 NOTE — ED Notes (Signed)
Pt has been vomiting since 1am today; emesis is green; pt was given 4mg  of zofran IM en route to hospital; pt is c/o generalized abdominal pain

## 2015-01-02 DIAGNOSIS — C50919 Malignant neoplasm of unspecified site of unspecified female breast: Secondary | ICD-10-CM

## 2015-01-02 DIAGNOSIS — N183 Chronic kidney disease, stage 3 (moderate): Secondary | ICD-10-CM

## 2015-01-02 DIAGNOSIS — E1122 Type 2 diabetes mellitus with diabetic chronic kidney disease: Secondary | ICD-10-CM

## 2015-01-02 DIAGNOSIS — C7951 Secondary malignant neoplasm of bone: Secondary | ICD-10-CM

## 2015-01-02 DIAGNOSIS — I509 Heart failure, unspecified: Secondary | ICD-10-CM

## 2015-01-02 LAB — BASIC METABOLIC PANEL
Anion gap: 10 (ref 5–15)
BUN: 27 mg/dL — AB (ref 6–20)
CHLORIDE: 108 mmol/L (ref 101–111)
CO2: 24 mmol/L (ref 22–32)
CREATININE: 1.3 mg/dL — AB (ref 0.44–1.00)
Calcium: 9.1 mg/dL (ref 8.9–10.3)
GFR calc Af Amer: 48 mL/min — ABNORMAL LOW (ref >60)
GFR, EST NON AFRICAN AMERICAN: 41 mL/min — AB (ref >60)
GLUCOSE: 92 mg/dL (ref 65–99)
Potassium: 3.3 mmol/L — ABNORMAL LOW (ref 3.5–5.1)
SODIUM: 142 mmol/L (ref 135–145)

## 2015-01-02 LAB — MRSA PCR SCREENING: MRSA BY PCR: NEGATIVE

## 2015-01-02 LAB — GLUCOSE, CAPILLARY
GLUCOSE-CAPILLARY: 54 mg/dL — AB (ref 65–99)
Glucose-Capillary: 91 mg/dL (ref 65–99)

## 2015-01-02 LAB — LACTIC ACID, PLASMA: LACTIC ACID, VENOUS: 1.1 mmol/L (ref 0.5–2.0)

## 2015-01-02 MED ORDER — DEXTROSE 50 % IV SOLN
INTRAVENOUS | Status: AC
Start: 2015-01-02 — End: 2015-01-02
  Administered 2015-01-02: 25 mL via INTRAVENOUS
  Filled 2015-01-02: qty 50

## 2015-01-02 MED ORDER — INSULIN ASPART 100 UNIT/ML ~~LOC~~ SOLN: 0.0000 [IU] | SUBCUTANEOUS | Status: DC

## 2015-01-02 MED ORDER — LACOSAMIDE 200 MG/20ML IV SOLN
INTRAVENOUS | Status: AC
Start: 2015-01-02 — End: 2015-01-02
  Filled 2015-01-02: qty 20

## 2015-01-02 MED ORDER — DEXTROSE 50 % IV SOLN
25.0000 mL | INTRAVENOUS | Status: AC
  Administered 2015-01-02: 25 mL via INTRAVENOUS

## 2015-01-02 MED ORDER — POTASSIUM CHLORIDE 10 MEQ/100ML IV SOLN
10.0000 meq | INTRAVENOUS | Status: AC
  Administered 2015-01-02 (×5): 10 meq via INTRAVENOUS
  Filled 2015-01-02: qty 100

## 2015-01-02 NOTE — Progress Notes (Signed)
PT Cancellation Note  Patient Details Name: Catherine Mccullough MRN: [id number] DOB: 12/15/1945   Cancelled Treatment:    Reason Eval/Treat Not Completed: Fatigue/lethargy limiting ability to participate.  Historically, pt has refused PT while in the hospital.  She states that she is not feeling well at all today but that I should return tomorrow and perhaps she would work with me.   Demetrios Isaacs L  PT 01/02/2015, 1:14 PM [phone]

## 2015-01-02 NOTE — Progress Notes (Addendum)
[facility] PROGRESS NOTE   Catherine Mccullough [id number] DOB: November 13, 1945 DOA: 01/01/2015 PCP: Default, Provider, MD  HPI/Subjective: Denies any new complaints,  Has slight nausea and abdominal pain.  came in from nursing home and needs PT/OT.  Assessment/Plan: Principal Problem:   SBO (small bowel obstruction) (HCC) Active Problems:   Breast cancer metastasized to bone [facility])   Dehydration   Right heart failure (HCC)   CKD (chronic kidney disease) stage 4, GFR 15-29 ml/min (HCC)   DM type 2 (diabetes mellitus, type 2) (HCC)   Essential hypertension   Leukocytosis   Systolic CHF, chronic (HCC)   SBO  - Confirmed on CT, with transition point in distal ilium; also noted is a stricture at the proximal transverse colon causing distension of the cecum  - NGT placed to suction, draining bilious fluid  - Continue antiemetics prn  - Keep strict NPO for now  - If fails to resolve over the next couple days, will need a surgical consultation, though pt is poor surgical candidate d/t comorbidities  - Keep potassium above 4, PT/OT to ambulate.  Dehydration  - Bolused 1L NS in ED  - Will hold on IVF for now given CHF with EF 25%  - Holding diuretics  - Following fluid balance closely as below   Chronic systolic CHF (EF 123456, 99991111)  - Was clinically dry on admit in setting of SBO with N/V  - Bolused 1L NS in ED  - Holding diuretics now  - Following strict I/Os, daily wts - May require additional IVF if SBO fails to resolve and pt continues NPO  CKD 3-4  - Stable, check chemistries in am  - Following fluid status closely  - Avoiding nephrotoxins as feasible   Leukocytosis  - Suspect this is reactive to #1, as no other suggestion of active infection  - Lactic acid staying wnl, making bowel ischemia unlikely at this time  - Monitoring, repeat CBC in am   Seizure disorder  - Ms. Catherine Mccullough on Vimpat at home  - Converted to IV while NPO      Breast cancer with bony metastasis  -follow-up with oncology as outpatient, patient seen earlier this months by palliative care.   Code Status: DNR Family Communication: Plan discussed with the patient. Disposition Plan: Remains inpatient Diet: Diet NPO time specified  Consultants:  None  Procedures:  None  Antibiotics:  None   Objective: Filed Vitals:   01/02/15 0031 01/02/15 0502  BP: 136/66 137/43  Pulse: 108 100  Temp: 98.3 F (36.8 C) 98.9 F (37.2 C)  Resp: 19 17    Intake/Output Summary (Last 24 hours) at 01/02/15 1125 Last data filed at 01/01/15 2245  Gross per 24 hour  Intake      0 ml  Output    250 ml  Net   -250 ml   Filed Weights   01/01/15 2252 01/02/15 0031 01/02/15 0517  Weight: 47.945 kg (105 lb 11.2 oz) 47.945 kg (105 lb 11.2 oz) 48.217 kg (106 lb 4.8 oz)    Exam: General: Alert and awake, oriented x3, not in any acute distress. HEENT: anicteric sclera, pupils reactive to light and accommodation, EOMI CVS: S1-S2 clear, no murmur rubs or gallops Chest: clear to auscultation bilaterally, no wheezing, rales or rhonchi Abdomen: soft nontender, nondistended, normal bowel sounds, no organomegaly Extremities: no cyanosis, clubbing or edema noted bilaterally Neuro: Cranial nerves II-XII intact, no focal neurological deficits  Data Reviewed: Basic Metabolic Panel:  Recent Labs Lab  01/01/15 2032 01/02/15 0655  NA 140 142  K 3.6 3.3*  CL 105 108  CO2 21* 24  GLUCOSE 114* 92  BUN 22* 27*  CREATININE 1.11* 1.30*  CALCIUM 10.0 9.1   Liver Function Tests:  Recent Labs Lab 01/01/15 2032  AST 18  ALT 9*  ALKPHOS 194*  BILITOT 0.9  PROT 8.4*  ALBUMIN 3.1*   No results for input(s): LIPASE, AMYLASE in the last 168 hours. No results for input(s): AMMONIA in the last 168 hours. CBC:  Recent Labs Lab 01/01/15 2032  WBC 19.4*  NEUTROABS 17.4*  HGB 12.1  HCT 37.2  MCV 93.9  PLT 390   Cardiac Enzymes: No results for  input(s): CKTOTAL, CKMB, CKMBINDEX, TROPONINI in the last 168 hours. BNP (last 3 results)  Recent Labs  03/04/14 0726 08/21/14 0029  BNP 1040.0* 1557.0*    ProBNP (last 3 results) No results for input(s): PROBNP in the last 8760 hours.  CBG: No results for input(s): GLUCAP in the last 168 hours.  Micro Recent Results (from the past 240 hour(s))  MRSA PCR Screening     Status: None   Collection Time: 01/02/15 12:54 AM  Result Value Ref Range Status   MRSA by PCR NEGATIVE NEGATIVE Final    Comment:        The GeneXpert MRSA Assay (FDA approved for NASAL specimens only), is one component of a comprehensive MRSA colonization surveillance program. It is not intended to diagnose MRSA infection nor to guide or monitor treatment for MRSA infections.      Studies: Ct Renal Stone Study  01/01/2015  CLINICAL DATA:  Generalized abdominal pain and vomiting for 1 day. EXAM: CT ABDOMEN AND PELVIS WITHOUT CONTRAST TECHNIQUE: Multidetector CT imaging of the abdomen and pelvis was performed following the standard protocol without IV contrast. COMPARISON:  12/11/2014 FINDINGS: Lower chest: Small right pleural effusion. Atherosclerotic disease of the coronary arteries. Hepatobiliary: No mass visualized on this un-enhanced exam. Status post cholecystectomy. Pancreas: The pancreas is atrophic. Coarse calcifications are seen within the head of the pancreas or within the peripancreatic fat. Spleen: Within normal limits in size. Adrenals/Urinary Tract: No evidence of urolithiasis or hydronephrosis. No definite mass visualized on this un-enhanced exam. Right kidney is atrophic. Stomach/Bowel: There is a diffuse fluid distention of small bowel loops to the level of the terminal ileum with maximum diameter of 37 mm. The transitional point appears to be at the level of the distal ileum in the right lower quadrant, and with there are inflammatory mesenteric changes. The cecum is distended. The transverse and  left colon and decompressed. Un apparent abrupt narrowing is seen at the level of the proximal transverse colon comminuted transitional point of small-bowel obstruction. Vascular/Lymphatic: No pathologically enlarged lymph nodes. No evidence of abdominal aortic aneurysm. Atherosclerotic disease of the aorta is seen. Reproductive: No mass or other significant abnormality. Other: Moderate amount of abdominal ascites.  Anasarca. Musculoskeletal: S shaped scoliosis with secondary osteoarthritic changes. Mottled appear over a rounds of L2 vertebral body may be metastatic. IMPRESSION: Small bowel obstruction with transitional point at the distal ileum. In cross proximity, there is also an apparent narrowing of the proximal transverse colon causing enlargement of the cecum. Ascites. Coarse calcifications within an atrophic appearing head of the pancreas. This may represent a chronic pancreatitis, or low grade pancreatic malignancy. This could be further evaluated with MRI when found clinically appropriate. Mottled appearance of L2 vertebral body, which may represent a metastatic lesion. Anasarca. Small right pleural  effusion. Electronically Signed   By: Fidela Salisbury M.D.   On: 01/01/2015 21:31    Scheduled Meds: . heparin  5,000 Units Subcutaneous 3 times per day  . lacosamide (VIMPAT) IV  50 mg Intravenous Q12H  . metoprolol  5 mg Intravenous 4 times per day  . pantoprazole (PROTONIX) IV  40 mg Intravenous Q24H  . sodium chloride  3 mL Intravenous Q12H   Continuous Infusions:      Time spent: 35 minutes    [facility] A  [facility] Pager [phone] If 7PM-7AM, please contact night-coverage at [email or web address], password [facility] 01/02/2015, 11:25 AM  LOS: 1 day

## 2015-01-02 NOTE — Clinical Social Work Note (Signed)
Clinical Social Work Assessment  Patient Details  Name: REMILYNN Mccullough MRN: [id number] Date of Birth: [birth year]  Date of referral:  01/02/15               Reason for consult:  Facility Placement                Permission sought to share information with:    Permission granted to share information::     Name::        Agency::     Relationship::     Contact Information:     Housing/Transportation Living arrangements for the past 2 months:  [place], [place] of Information:  Patient Patient Interpreter Needed:  None Criminal Activity/Legal Involvement Pertinent to Current Situation/Hospitalization:  No - Comment as needed Significant Relationships:  Adult Children Lives with:  Facility Resident Do you feel safe going back to the place where you live?  Yes Need for family participation in patient care:  Yes (Comment)  Care giving concerns:  Facility resident    Social Worker assessment / plan:  Patient advised that she has been at [facility] for about two weeks for rehab. She stated that that the physical therapist had her walking a short distance prior to this hospitalization.  Patient stated that she plans on returning to the facility to complete her rehab. Patient stated that she has two adult children who are very supportive. CSW spoke with Gerald Stabs of [facility] who confirmed patient's statements. He advised that patient can return at discharge.   Employment status:  Retired Nurse, adult PT Recommendations:  Not assessed at this time Information / Referral to community resources:     Patient/Family's Response to care:  Patient is agreeable to return to SNF at discharge to complete rehab.   Patient/Family's Understanding of and Emotional Response to Diagnosis, Current Treatment, and Prognosis:  Patient is aware of her diagnosis, treatment and prognosis.   Emotional Assessment Appearance:  Appears  stated age Attitude/Demeanor/Rapport:   (Cooperative) Affect (typically observed):  Calm Orientation:  Oriented to Self, Oriented to Place, Oriented to  Time, Oriented to Situation Alcohol / Substance use:  Not Applicable Psych involvement (Current and /or in the community):  No (Comment)  Discharge Needs  Concerns to be addressed:  Discharge Planning Concerns Readmission within the last 30 days:  Yes Current discharge risk:  Chronically ill Barriers to Discharge:  No Barriers Identified   Ihor Gully, LCSW 01/02/2015, 1:17 PM

## 2015-01-02 NOTE — Care Management Note (Signed)
Case Management Note  Patient Details  Name: Catherine Mccullough MRN: [id number] Date of Birth: [birth year]  Subjective/Objective:                  Pt admitted from [facility] with SBO. Pt will return to facility at discharge.  Action/Plan: CSW is aware and will arrange discharge to facility when medically stable.  Expected Discharge Date:                  Expected Discharge Plan:  Skilled Nursing Facility  In-House Referral:  Clinical Social Work  Discharge planning Services  CM Consult  Post Acute Care Choice:  NA Choice offered to:  NA  DME Arranged:    DME Agency:     [name]:    [place] Agency:     Status of Service:  Completed, signed off  Medicare Important Message Given:    Date Medicare IM Given:    Medicare IM give by:    Date Additional Medicare IM Given:    Additional Medicare Important Message give by:     If discussed at [place] of Stay Meetings, dates discussed:    Additional Comments:  Joylene Draft, RN 01/02/2015, 9:15 AM

## 2015-01-03 ENCOUNTER — Inpatient Hospital Stay (HOSPITAL_COMMUNITY): Payer: Medicare Other

## 2015-01-03 DIAGNOSIS — E46 Unspecified protein-calorie malnutrition: Secondary | ICD-10-CM

## 2015-01-03 DIAGNOSIS — E43 Unspecified severe protein-calorie malnutrition: Secondary | ICD-10-CM | POA: Diagnosis present

## 2015-01-03 DIAGNOSIS — I10 Essential (primary) hypertension: Secondary | ICD-10-CM

## 2015-01-03 LAB — CBC
HEMATOCRIT: 27.7 % — AB (ref 36.0–46.0)
Hemoglobin: 8.9 g/dL — ABNORMAL LOW (ref 12.0–15.0)
MCH: 31.3 pg (ref 26.0–34.0)
MCHC: 32.1 g/dL (ref 30.0–36.0)
MCV: 97.5 fL (ref 78.0–100.0)
PLATELETS: 285 10*3/uL (ref 150–400)
RBC: 2.84 MIL/uL — AB (ref 3.87–5.11)
RDW: 18.6 % — ABNORMAL HIGH (ref 11.5–15.5)
WBC: 10.4 10*3/uL (ref 4.0–10.5)

## 2015-01-03 LAB — GLUCOSE, CAPILLARY
GLUCOSE-CAPILLARY: 100 mg/dL — AB (ref 65–99)
GLUCOSE-CAPILLARY: 50 mg/dL — AB (ref 65–99)
GLUCOSE-CAPILLARY: 52 mg/dL — AB (ref 65–99)
GLUCOSE-CAPILLARY: 61 mg/dL — AB (ref 65–99)
GLUCOSE-CAPILLARY: 66 mg/dL (ref 65–99)
Glucose-Capillary: 70 mg/dL (ref 65–99)
Glucose-Capillary: 74 mg/dL (ref 65–99)
Glucose-Capillary: 72 mg/dL (ref 65–99)
Glucose-Capillary: 87 mg/dL (ref 65–99)
Glucose-Capillary: 91 mg/dL (ref 65–99)

## 2015-01-03 LAB — BASIC METABOLIC PANEL
Anion gap: 7 (ref 5–15)
BUN: 27 mg/dL — ABNORMAL HIGH (ref 6–20)
CHLORIDE: 109 mmol/L (ref 101–111)
CO2: 24 mmol/L (ref 22–32)
Calcium: 8.4 mg/dL — ABNORMAL LOW (ref 8.9–10.3)
Creatinine, Ser: 1.42 mg/dL — ABNORMAL HIGH (ref 0.44–1.00)
GFR calc non Af Amer: 37 mL/min — ABNORMAL LOW (ref >60)
GFR, EST AFRICAN AMERICAN: 43 mL/min — AB (ref >60)
Glucose, Bld: 81 mg/dL (ref 65–99)
POTASSIUM: 4.3 mmol/L (ref 3.5–5.1)
SODIUM: 140 mmol/L (ref 135–145)

## 2015-01-03 MED ORDER — DEXTROSE 10 % IV SOLN
INTRAVENOUS | Status: DC
  Administered 2015-01-03 – 2015-01-04 (×2): via INTRAVENOUS
  Administered 2015-01-05: 1 mL via INTRAVENOUS

## 2015-01-03 MED ORDER — DEXTROSE 5 % IV SOLN
INTRAVENOUS | Status: DC
  Administered 2015-01-03: 02:00:00 via INTRAVENOUS

## 2015-01-03 MED ORDER — LACOSAMIDE 200 MG/20ML IV SOLN
INTRAVENOUS | Status: AC
  Filled 2015-01-03: qty 20

## 2015-01-03 MED ORDER — DEXTROSE 50 % IV SOLN
25.0000 mL | Freq: Once | INTRAVENOUS | Status: AC
  Administered 2015-01-03: 25 mL via INTRAVENOUS

## 2015-01-03 MED ORDER — DEXTROSE 50 % IV SOLN
INTRAVENOUS | Status: AC
  Administered 2015-01-03: 50 mL
  Filled 2015-01-03: qty 50

## 2015-01-03 MED ORDER — MORPHINE SULFATE (PF) 2 MG/ML IV SOLN
2.0000 mg | INTRAVENOUS | Status: DC | PRN
  Administered 2015-01-03 – 2015-01-10 (×31): 2 mg via INTRAVENOUS
  Filled 2015-01-03 (×31): qty 1

## 2015-01-03 MED ORDER — DEXTROSE-NACL 5-0.9 % IV SOLN
INTRAVENOUS | Status: DC
  Administered 2015-01-03: 11:00:00 via INTRAVENOUS

## 2015-01-03 NOTE — Progress Notes (Addendum)
[facility] PROGRESS NOTE  Catherine Mccullough [id number] DOB: 04-29-1945 DOA: 01/01/2015 PCP: Default, Provider, MD  brief narrative 69 year old female with history of breast cancer with bone metastases (reports completed treatment at present), severe chronic CHF (EF of 25%), seizure disorder with multiple abdominal surgeries presented from nursing facility with nausea, vomiting and abdominal pain since 1 day. Reports poor appetite with nonbilious vomiting. Denied any fevers, chills, diarrhea. In the ED she was found to have WBC of 19,000. CT abdomen and pelvis nodular small bowel obstruction with transition point in the distal ileum also found to have a stricture at the proximal gaseous colon and cecal distention. Admitted to hospitalist service.   Assessment/Plan: Partial small bowel obstruction NG tube placed to suction initially draining bilious fluid now improved. Kept strict nothing by mouth. Continued with pain control and antiemetics. Follow-up abdominal x-ray this morning shows resolution of small bowel obstruction. Will clamp and start her on clear liquid. -Creatinine ratio potassium.  Dehydration Received  IV normal saline bolus in the ED. Holding diuretics. euvolemic at present    hypoglycemia FSG drop to 50 overnight. patient nothing by mouth. Placed on low D5 infusion.  Chronic systolic CHF Hypovolemic on presentation. Holding diuretics. Monitor strict I/O.  CKD stage  3-4 Stable.  Leukocytosis Appears to be reactive on admission. Now normalized. Lactic acid normal.  Seizure disorder Continue Vimpat  Breast cancer with bony metastases Follow-up with oncology as outpatient. Was seen earlier this month by primary care.  Protein calorie malnutrition Dietitian consulted   Code Status: DNR Family Communication: none at bedside Disposition Plan: return to SNF once improved   Consultants:  none  Procedures:  CT  abd  Antibiotics:  none  HPI/Subjective: Seen and examined. Complains of abdominal pain but no nausea. FSG drop to 50 overnight but was asymptomatic. NG draining scanty bilious fluid.   Objective: Filed Vitals:   01/03/15 0022 01/03/15 0538  BP: 131/60 118/63  Pulse: 105 103  Temp:  98.4 F (36.9 C)  Resp:  18    Intake/Output Summary (Last 24 hours) at 01/03/15 1023 Last data filed at 01/03/15 [id number]  Gross per 24 hour  Intake 201.33 ml  Output    400 ml  Net -198.67 ml   Filed Weights   01/02/15 0031 01/02/15 0517 01/03/15 0538  Weight: 47.945 kg (105 lb 11.2 oz) 48.217 kg (106 lb 4.8 oz) 48.172 kg (106 lb 3.2 oz)    Exam:   General: elderly thin built female not in distress  HEENT: Dry mucosa, supple neck  Chest: Clear Bilaterally   Cardiovascular: Normal S1 S2, no murmurs   GI: Soft, nondistended, mild diffuse tenderness to palpation  Musculoskeletal: Warm, no edema  CNS: Alert and oriented  Data Reviewed: Basic Metabolic Panel:  Recent Labs Lab 01/01/15 2032 01/02/15 0655 01/03/15 0639  NA 140 142 140  K 3.6 3.3* 4.3  CL 105 108 109  CO2 21* 24 24  GLUCOSE 114* 92 81  BUN 22* 27* 27*  CREATININE 1.11* 1.30* 1.42*  CALCIUM 10.0 9.1 8.4*   Liver Function Tests:  Recent Labs Lab 01/01/15 2032  AST 18  ALT 9*  ALKPHOS 194*  BILITOT 0.9  PROT 8.4*  ALBUMIN 3.1*   No results for input(s): LIPASE, AMYLASE in the last 168 hours. No results for input(s): AMMONIA in the last 168 hours. CBC:  Recent Labs Lab 01/01/15 2032 01/03/15 0639  WBC 19.4* 10.4  NEUTROABS 17.4*  --   HGB 12.1 8.9*  HCT 37.2 27.7*  MCV 93.9 97.5  PLT 390 285   Cardiac Enzymes: No results for input(s): CKTOTAL, CKMB, CKMBINDEX, TROPONINI in the last 168 hours. BNP (last 3 results)  Recent Labs  03/04/14 0726 08/21/14 0029  BNP 1040.0* 1557.0*    ProBNP (last 3 results) No results for input(s): PROBNP in the last 8760 hours.  CBG:  Recent  Labs Lab 01/03/15 0031 01/03/15 0114 01/03/15 0157 01/03/15 0437 01/03/15 0730  GLUCAP 50* 100* 70 74 72    Recent Results (from the past 240 hour(s))  MRSA PCR Screening     Status: None   Collection Time: 01/02/15 12:54 AM  Result Value Ref Range Status   MRSA by PCR NEGATIVE NEGATIVE Final    Comment:        The GeneXpert MRSA Assay (FDA approved for NASAL specimens only), is one component of a comprehensive MRSA colonization surveillance program. It is not intended to diagnose MRSA infection nor to guide or monitor treatment for MRSA infections.      Studies: Dg Abd Portable 1v  01/03/2015  CLINICAL DATA:  Small bowel obstruction EXAM: PORTABLE ABDOMEN - 1 VIEW COMPARISON:  CT abdomen and pelvis January 01, 2015 FINDINGS: There is currently no appreciable bowel dilatation or air-fluid level. No free air evident on this supine examination. There is lumbar scoliosis with extensive lumbar arthropathy. There are surgical clips in the pelvis and right upper quadrant regions. There are phleboliths in the pelvis. IMPRESSION: There is no longer appreciable bowel dilatation. The appearance is consistent with resolving bowel obstruction. No free air evident. Advanced arthropathy in the lumbar spine with scoliosis remains. Electronically Signed   By: Lowella Grip III M.D.   On: 01/03/2015 08:50   Ct Renal Stone Study  01/01/2015  CLINICAL DATA:  Generalized abdominal pain and vomiting for 1 day. EXAM: CT ABDOMEN AND PELVIS WITHOUT CONTRAST TECHNIQUE: Multidetector CT imaging of the abdomen and pelvis was performed following the standard protocol without IV contrast. COMPARISON:  12/11/2014 FINDINGS: Lower chest: Small right pleural effusion. Atherosclerotic disease of the coronary arteries. Hepatobiliary: No mass visualized on this un-enhanced exam. Status post cholecystectomy. Pancreas: The pancreas is atrophic. Coarse calcifications are seen within the head of the pancreas or  within the peripancreatic fat. Spleen: Within normal limits in size. Adrenals/Urinary Tract: No evidence of urolithiasis or hydronephrosis. No definite mass visualized on this un-enhanced exam. Right kidney is atrophic. Stomach/Bowel: There is a diffuse fluid distention of small bowel loops to the level of the terminal ileum with maximum diameter of 37 mm. The transitional point appears to be at the level of the distal ileum in the right lower quadrant, and with there are inflammatory mesenteric changes. The cecum is distended. The transverse and left colon and decompressed. Un apparent abrupt narrowing is seen at the level of the proximal transverse colon comminuted transitional point of small-bowel obstruction. Vascular/Lymphatic: No pathologically enlarged lymph nodes. No evidence of abdominal aortic aneurysm. Atherosclerotic disease of the aorta is seen. Reproductive: No mass or other significant abnormality. Other: Moderate amount of abdominal ascites.  Anasarca. Musculoskeletal: S shaped scoliosis with secondary osteoarthritic changes. Mottled appear over a rounds of L2 vertebral body may be metastatic. IMPRESSION: Small bowel obstruction with transitional point at the distal ileum. In cross proximity, there is also an apparent narrowing of the proximal transverse colon causing enlargement of the cecum. Ascites. Coarse calcifications within an atrophic appearing head of the pancreas. This may represent a chronic pancreatitis, or low grade  pancreatic malignancy. This could be further evaluated with MRI when found clinically appropriate. Mottled appearance of L2 vertebral body, which may represent a metastatic lesion. Anasarca. Small right pleural effusion. Electronically Signed   By: Fidela Salisbury M.D.   On: 01/01/2015 21:31    Scheduled Meds: . heparin  5,000 Units Subcutaneous 3 times per day  . lacosamide (VIMPAT) IV  50 mg Intravenous Q12H  . metoprolol  5 mg Intravenous 4 times per day  .  pantoprazole (PROTONIX) IV  40 mg Intravenous Q24H  . sodium chloride  3 mL Intravenous Q12H   Continuous Infusions: . dextrose 40 mL/hr at 01/03/15 0137  . dextrose 5 % and 0.9% NaCl         Time spent: 25 minutes    Kayelyn Lemon, [place]  [facility] Pager [phone]. If 7PM-7AM, please contact night-coverage at [email or web address], password [facility] 01/03/2015, 10:23 AM  LOS: 2 days

## 2015-01-03 NOTE — Progress Notes (Signed)
Patient's CBG 66, juice given, rechecked CBG 52. D5 25g given. Patient asymptomatic. Dr. Clementeen Graham notified.

## 2015-01-03 NOTE — NC FL2 (Signed)
[place] LEVEL OF CARE SCREENING TOOL     IDENTIFICATION  Patient Name: Catherine Mccullough Birthdate: 12-08-1945 Sex: female Admission Date (Current Location): 01/01/2015  [facility] and [place] Number:  [employer] and Address:  [place] [address], [place]      Provider Number: [id number]  Attending Physician Name and Address:  Louellen Molder, MD  Relative Name and Phone Number:       Current Level of Care: Hospital Recommended Level of Care: [place] Prior Approval Number:    Date Approved/Denied:   PASRR Number:  ([id number])  Discharge Plan: SNF    Current Diagnoses: Patient Active Problem List   Diagnosis Date Noted  . SBO (small bowel obstruction) ([place]) 01/01/2015  . Leukocytosis 01/01/2015  . Systolic CHF, chronic ([place]) 01/01/2015  . Seizures ([place])   . Diarrhea 12/13/2014  . Metabolic acidosis [id number]  . Acute systolic congestive heart failure ([place]) 12/13/2014  . Acute blood loss anemia 12/09/2014  . Encephalopathy acute   . Acute respiratory failure with hypoxia ([place])   . Status epilepticus ([place])   . Palliative care encounter   . DNR (do not resuscitate) discussion   . Mallory-Weiss tear   . AKI (acute kidney injury) ([place])   . Severe sepsis with septic shock ([place]) 08/21/2014  . Pulmonary insufficiency 08/21/2014  . Fracture five ribs-closed   . Hx MRSA infection 09/08/2012  . S/P total knee arthroplasty 09/08/2012  . Sepsis(995.91) 08/18/2011  . Breast cancer metastasized to bone ([place]) 08/18/2011  . Chronic pain 08/18/2011  . Dehydration 08/18/2011  . Right heart failure ([place]) 08/18/2011  . Thrombocytopenia ([place]) 08/18/2011  . Macrocytic anemia 08/18/2011  . CKD (chronic kidney disease) stage 4, GFR 15-29 ml/min (HCC) 08/18/2011  . DM type 2 (diabetes mellitus, type 2) ([place]) 08/18/2011  . OA (osteoarthritis) 08/18/2011  . Fall 08/18/2011  . Essential hypertension  08/18/2011  . Septic joint of left knee joint ([place]) 08/18/2011  . H/O endocarditis 08/18/2011  . Left leg weakness 06/11/2011  . Difficulty in walking(719.7) 06/11/2011  . Pain in joint, shoulder region 05/29/2011  . Rotator cuff tear arthropathy of right shoulder 05/29/2011  . Muscle weakness (generalized) 05/29/2011    Orientation RESPIRATION BLADDER Height & Weight    Self, Time, Situation, Place  Normal Continent 4\' 7"  (139.7 cm) 106 lbs.  BEHAVIORAL SYMPTOMS/MOOD NEUROLOGICAL BOWEL NUTRITION STATUS      Continent Diet  AMBULATORY STATUS COMMUNICATION OF NEEDS Skin   Extensive Assist Verbally Normal                       Personal Care Assistance Level of Assistance  Bathing, Dressing Bathing Assistance: Limited assistance   Dressing Assistance: Limited assistance     Functional Limitations Info             SPECIAL CARE FACTORS FREQUENCY                       Contractures      Additional Factors Info  Psychotropic Code Status Info: DNR  Psychotropic Info: Xanax         Current Medications (01/03/2015):  This is the current hospital active medication list Current Facility-Administered Medications  Medication Dose Route Frequency Provider Last Rate Last Dose  . 0.9 %  sodium chloride infusion  250 mL Intravenous PRN Vianne Bulls, MD      . acetaminophen (TYLENOL) tablet  650 mg  650 mg Oral Q4H PRN Vianne Bulls, MD      . ALPRAZolam Duanne Moron) tablet 0.25 mg  0.25 mg Oral BID PRN Vianne Bulls, MD   0.25 mg at 01/02/15 0105  . dextrose 5 %-0.9 % sodium chloride infusion   Intravenous Continuous Nishant Dhungel, MD 50 mL/hr at 01/03/15 1049    . diclofenac sodium (VOLTAREN) 1 % transdermal gel 2 g  2 g Topical TID PRN Vianne Bulls, MD      . heparin injection 5,000 Units  5,000 Units Subcutaneous 3 times per day Vianne Bulls, MD   5,000 Units at 01/03/15 0547  . hydrALAZINE (APRESOLINE) injection 5 mg  5 mg Intravenous Q4H PRN Vianne Bulls,  MD      . lacosamide (VIMPAT) 50 mg in sodium chloride 0.9 % 25 mL IVPB  50 mg Intravenous Q12H Vianne Bulls, MD   50 mg at 01/03/15 1053  . metoprolol (LOPRESSOR) injection 5 mg  5 mg Intravenous 4 times per day Vianne Bulls, MD   5 mg at 01/03/15 0547  . morphine 2 MG/ML injection 2 mg  2 mg Intravenous Q4H PRN Nishant Dhungel, MD   2 mg at 01/03/15 1047  . ondansetron (ZOFRAN) injection 4 mg  4 mg Intravenous Q6H PRN Ilene Qua Opyd, MD      . pantoprazole (PROTONIX) injection 40 mg  40 mg Intravenous Q24H Vianne Bulls, MD   40 mg at 01/03/15 0020  . sodium chloride 0.9 % injection 3 mL  3 mL Intravenous Q12H Ilene Qua Opyd, MD   3 mL at 01/02/15 1000  . sodium chloride 0.9 % injection 3 mL  3 mL Intravenous PRN Vianne Bulls, MD         Discharge Medications: Please see discharge summary for a list of discharge medications.  Relevant Imaging Results:  Relevant Lab Results:   Additional Information    Karma Ansley, [place]dene Pugh, LCSW

## 2015-01-03 NOTE — Progress Notes (Signed)
Patient had a blood sugar of 54. Asymptomatic. 1/2 amp of D50 given. Patient's sugar came up to 90. Contacted midlevel to notify of this.  Accuchecks now ordered q2 hours for 3 occurrences. Will continue to monitor.

## 2015-01-03 NOTE — Evaluation (Signed)
Physical Therapy Evaluation Patient Details Name: Catherine Mccullough MRN: [id number] DOB: February 21, 1945 Today's Date: 01/03/2015   History of Present Illness  Pt is as 69 year old female admitted with nausea and vomiting.  She is found to have a small bowel obstruction.  She has a hx of breast CA with mets to the bone, CHF, DM, HTN and CKD.  She had been residing at [facility] for rehab following a recent exacerbation of illness.  Clinical Impression   Pt was seen for evaluation.  She had no specific c/o but was still nauseated with abdominal pain.  She was agreeable to work on some general strengthening in the bed but did not feel up to mobilizing OOB.  She is found to have profound weakness in both ankles with neuropathy in both feet, generalized weakness in the hips and knees.  She was exhausted from working on gentle bed strengthening ex but reported feeling "encouraged" that she could do it.  She will need to return to SNF at d/c for progression of mobilty.    Follow Up Recommendations SNF    Equipment Recommendations  None recommended by PT    Recommendations for Other Services   none    Precautions / Restrictions Precautions Precautions: Fall Restrictions Weight Bearing Restrictions: No      Mobility  Bed Mobility               General bed mobility comments: pt feels unable to mobilize in the bed  Transfers                    Ambulation/Gait                Stairs            Wheelchair Mobility    Modified Rankin (Stroke Patients Only)       Balance                                             Pertinent Vitals/Pain Pain Assessment: No/denies pain    Home Living Family/patient expects to be discharged to:: Skilled nursing facility Living Arrangements: Alone                    Prior Function Level of Independence: Needs assistance   Gait / Transfers Assistance Needed: pt has been at SNF and has been able to stand  to a walker x 1 since being there....she is normally able to ambulate with a 4 wheeled walker and a shoe with a lift  ADL's / Homemaking Assistance Needed: full assist        Hand Dominance   Dominant Hand: Right    Extremity/Trunk Assessment               Lower Extremity Assessment: Generalized weakness;RLE deficits/detail;LLE deficits/detail RLE Deficits / Details: significant weakness at the ankle with foot drop, pain with any palpation on the plantar surface of foot LLE Deficits / Details: significant weakness of the ankle with foot drop, pain with any palpation of the plantar surface of foot.     Communication   Communication: No difficulties  Cognition Arousal/Alertness: Awake/alert Behavior During Therapy: WFL for tasks assessed/performed Overall Cognitive Status: Within Functional Limits for tasks assessed  General Comments      Exercises General Exercises - Lower Extremity Ankle Circles/Pumps: AROM;Both;10 reps;Supine Quad Sets: AROM;Both;10 reps;Supine Gluteal Sets: AROM;Supine;Both;10 reps Heel Slides: AROM;Both;10 reps;Supine Hip ABduction/ADduction: AROM;Both;10 reps;Supine Straight Leg Raises: AROM;Both;10 reps;Supine      Assessment/Plan    PT Assessment Patient needs continued PT services  PT Diagnosis Generalized weakness;Difficulty walking   PT Problem List Decreased strength;Decreased activity tolerance;Decreased mobility;Impaired sensation  PT Treatment Interventions Functional mobility training;Therapeutic exercise   PT Goals (Current goals can be found in the Care Plan section) Acute Rehab PT Goals Patient Stated Goal: wants to be able to walk PT Goal Formulation: With patient Time For Goal Achievement: 01/17/15 Potential to Achieve Goals: Fair    Frequency Min 2X/week   Barriers to discharge Decreased caregiver support lives alone    Co-evaluation               End of Session Equipment  Utilized During Treatment: Gait belt Activity Tolerance: Patient tolerated treatment well Patient left: in bed;with call bell/phone within reach           Time: 1120-1143 PT Time Calculation (min) (ACUTE ONLY): 23 min   Charges:   PT Evaluation $Initial PT Evaluation Tier I: 1 Procedure     PT G CodesDemetrios Isaacs L  PT 01/03/2015, 11:49 AM [phone]

## 2015-01-03 NOTE — Progress Notes (Signed)
Initial Nutrition Assessment  Pt meets criteria for SEVERE MALNUTRITION in the context of ACUTE ILLNESS as evidenced by an estimated energy intake of < or equal to 50% of needs for > or equal to 5 days, loss of 8% bw in 1 mont.  DOCUMENTATION CODES:  Severe malnutrition in context of acute illness/injury  INTERVENTION:  Boost Breeze po daily, each supplement provides 250 kcal and 9 grams of protein  Recommend MVI w/ min in light of prolonged minimal oral intake.   Will follow and reassess as her symptoms resolve.   NUTRITION DIAGNOSIS:  Inadequate oral intake related to altered GI function (sbo) and vomiting/nausea as evidenced by an estimated energy intake of < or equal to 50% of needs for > or equal to 5 days, loss of 8% bw in 1 month and mild depletion of muscle mass.  GOAL:  Patient will meet greater than or equal to 90% of their needs  MONITOR:  PO intake, Supplement acceptance, Diet advancement, Labs, Weight trends, I & O's  REASON FOR ASSESSMENT:  Consult Assessment of nutrition requirement/status  ASSESSMENT:  69 y.o. Female PMH CKD 3-4, metastatic Breast cancer, DM, HTN, CHF, seizure disorder, and multiple abdominal surgeries who presents from a nursing facility with nausea, vomiting, and abdominal pain of one day's duration. CT confirmed SBO which, at the time of this note, has now resolved w/ conservative treatment.   Pt had complex 2 week hospital stay approximately 2 weeks ago that required TPN and intubation. Her lowest wt that admission was 115 lbs. This admission reveals She has lost another 8% of her bw since then.  Patient believes that she never fully recovered from her last admission. Even when she got home she experienced postprandial nausea that limited her oral intake. Pt states that nausea is the worst feeling for her and will not eat because of it. RD asked if she was able to stay hydrated/drink fluids and she reports that she would just have ice chips. Her  nausea and poor oral intake continued until Monday evening when she went to eat dinner and experienced emesis immediately afterward. She reports having nothing to eat since then.   As of now she says she has very severe abdominal pain. Her NG tube is still actively draining. It has put out ~700 mls so far. She also has constipation and diarrhea at this time. She is very decondition and reports exhaustion from today's minimal PT and reports being too weak to walk.  She is ordered a diet, but is scared to eat/drink because of the nausea and anticipated emesis. She said she would try Breeze.   NFPE: Limited due to patients positioning, but mild lower body wasting noted. Upper body appears WDL.   Diet Order:  Diet clear liquid Room service appropriate?: Yes; Fluid consistency:: Thin  Skin:  Dry, Flaky, ecchymotic,   Last BM:  12/26  Height:  Ht Readings from Last 1 Encounters:  01/02/15 4\' 7"  (1.397 m)   Weight:  Wt Readings from Last 1 Encounters:  01/03/15 106 lb 3.2 oz (48.172 kg)   Wt Readings from Last 10 Encounters:  01/03/15 106 lb 3.2 oz (48.172 kg)  12/11/14 131 lb 13.4 oz (59.8 kg)  09/22/14 125 lb 12.8 oz (57.063 kg)  08/22/14 138 lb 7.2 oz (62.8 kg)  03/04/14 118 lb (53.524 kg)  01/20/14 118 lb (53.524 kg)  11/03/13 106 lb (48.081 kg)  09/25/12 106 lb (48.081 kg)  05/22/12 105 lb (47.628 kg)  08/18/11  112 lb 14 oz (51.2 kg)   Ideal Body Weight:  41.6 kg  BMI:  Body mass index is 24.68 kg/(m^2).  Estimated Nutritional Needs:  Kcal:  1200-1400 (25-29 kcal/kg bw) Protein:  30-40 g (.6-.8 g/kg to preserve renal fx) Fluid:  1.2-1.4 liters fluid  EDUCATION NEEDS:  No education needs identified at this time  Burtis Junes RD, LDN Nutrition Pager: [phone] 01/03/2015 3:29 PM

## 2015-01-03 NOTE — Progress Notes (Signed)
Patient's blood sugar dropped to 50 again. Asymptomatic. Paged hospitalist. Ordered received for D5W@40cc  hr. Will follow orders and continue to monitor.

## 2015-01-04 DIAGNOSIS — E162 Hypoglycemia, unspecified: Secondary | ICD-10-CM

## 2015-01-04 DIAGNOSIS — E43 Unspecified severe protein-calorie malnutrition: Secondary | ICD-10-CM

## 2015-01-04 LAB — BASIC METABOLIC PANEL
ANION GAP: 6 (ref 5–15)
BUN: 23 mg/dL — ABNORMAL HIGH (ref 6–20)
CO2: 25 mmol/L (ref 22–32)
Calcium: 8.4 mg/dL — ABNORMAL LOW (ref 8.9–10.3)
Chloride: 108 mmol/L (ref 101–111)
Creatinine, Ser: 1.17 mg/dL — ABNORMAL HIGH (ref 0.44–1.00)
GFR, EST AFRICAN AMERICAN: 54 mL/min — AB (ref >60)
GFR, EST NON AFRICAN AMERICAN: 47 mL/min — AB (ref >60)
GLUCOSE: 116 mg/dL — AB (ref 65–99)
POTASSIUM: 4.1 mmol/L (ref 3.5–5.1)
Sodium: 139 mmol/L (ref 135–145)

## 2015-01-04 LAB — GLUCOSE, CAPILLARY
GLUCOSE-CAPILLARY: 100 mg/dL — AB (ref 65–99)
GLUCOSE-CAPILLARY: 112 mg/dL — AB (ref 65–99)
GLUCOSE-CAPILLARY: 89 mg/dL (ref 65–99)
Glucose-Capillary: 98 mg/dL (ref 65–99)
Glucose-Capillary: 96 mg/dL (ref 65–99)
Glucose-Capillary: 98 mg/dL (ref 65–99)

## 2015-01-04 LAB — TSH: TSH: 0.227 u[IU]/mL — AB (ref 0.350–4.500)

## 2015-01-04 LAB — CORTISOL: Cortisol, Plasma: 19 ug/dL

## 2015-01-04 MED ORDER — LACOSAMIDE 200 MG/20ML IV SOLN
INTRAVENOUS | Status: AC
  Filled 2015-01-04: qty 20

## 2015-01-04 NOTE — Progress Notes (Addendum)
[facility] PROGRESS NOTE  Catherine Mccullough [id number] DOB: January 22, 1945 DOA: 01/01/2015 PCP: Default, Provider, MD  brief narrative 69 year old female with history of breast cancer with bone metastases (reports completed treatment at present), severe chronic CHF (EF of 25%), seizure disorder with multiple abdominal surgeries presented from nursing facility with nausea, vomiting and abdominal pain since 1 day. Reports poor appetite with nonbilious vomiting. Denied any fevers, chills, diarrhea. In the ED she was found to have WBC of 19,000. CT abdomen and pelvis nodular small bowel obstruction with transition point in the distal ileum also found to have a stricture at the proximal gaseous colon and cecal distention. Admitted to hospitalist service.   Assessment/Plan: Partial small bowel obstruction NG tube placed to suction initially draining bilious fluid now improved. Pain control with when necessary pain medications and antiemetics.  Follow-up abdominal x-ray this morning shows resolution of small bowel obstruction. Tolerating clears. Clamp MG and discontinued.  Dehydration Received  IV normal saline bolus in the ED. Holding diuretics. euvolemic at present   Recurrent hypoglycemia FSG drop into 50s. Was placed on D5 then switched to  D10 drip. Requiring intermittent D50.. patient was nothing by mouth. Normal cortisol level. TSH 4 weeks back was normal.. She was found to have 1-2 episodes of hypoglycemia during recent hospitalization as well. I see her diagnosis of diabetes mellitus in her chart but she is not on any medications or has recent A1c. check A1c. -Check insulin antibodies and repeat TSH.  Chronic systolic CHF Hypovolemic on presentation. Holding diuretics. Monitor strict I/O.  CKD stage  3-4 Stable.  Leukocytosis Appears to be reactive on admission. Now normalized. Lactic acid normal.  Seizure disorder Continue Vimpat  Breast cancer with bony  metastases Follow-up with oncology as outpatient. Was seen earlier this month by palliative care  Protein calorie malnutrition, severe Dietitian consulted. Added supplement.   Code Status: DNR Family Communication: none at bedside Disposition Plan: return to SNF once improved. Possibly in the next 2 days   Consultants:  none  Procedures:  CT abdomen  Antibiotics:  none  HPI/Subjective: Seen and examined. Still contains of diffuse abdominal pain. No nausea or vomiting. Having episodes of low blood glucose.  Objective: Filed Vitals:   01/04/15 0522 01/04/15 1326  BP: 117/59 108/46  Pulse: 108 99  Temp: 98.2 F (36.8 C) 99.1 F (37.3 C)  Resp: 18 18    Intake/Output Summary (Last 24 hours) at 01/04/15 1426 Last data filed at 01/04/15 1333  Gross per 24 hour  Intake   1610 ml  Output   1000 ml  Net    610 ml   Filed Weights   01/02/15 0517 01/03/15 0538 01/04/15 0522  Weight: 48.217 kg (106 lb 4.8 oz) 48.172 kg (106 lb 3.2 oz) 48.079 kg (105 lb 15.9 oz)    Exam:   General: elderly thin built female not in distress  HEENT: Dry mucosa, supple neck, temporal wasting  Chest: Clear Bilaterally   Cardiovascular: Normal S1 S2, no murmurs   GI: Soft, nondistended, mild diffuse tenderness to palpation  Musculoskeletal: Warm, no edema  CNS: Alert and oriented  Data Reviewed: Basic Metabolic Panel:  Recent Labs Lab 01/01/15 2032 01/02/15 0655 01/03/15 0639 01/04/15 0636  NA 140 142 140 139  K 3.6 3.3* 4.3 4.1  CL 105 108 109 108  CO2 21* 24 24 25   GLUCOSE 114* 92 81 116*  BUN 22* 27* 27* 23*  CREATININE 1.11* 1.30* 1.42* 1.17*  CALCIUM 10.0 9.1  8.4* 8.4*   Liver Function Tests:  Recent Labs Lab 01/01/15 2032  AST 18  ALT 9*  ALKPHOS 194*  BILITOT 0.9  PROT 8.4*  ALBUMIN 3.1*   No results for input(s): LIPASE, AMYLASE in the last 168 hours. No results for input(s): AMMONIA in the last 168 hours. CBC:  Recent Labs Lab 01/01/15 2032  01/03/15 0639  WBC 19.4* 10.4  NEUTROABS 17.4*  --   HGB 12.1 8.9*  HCT 37.2 27.7*  MCV 93.9 97.5  PLT 390 285   Cardiac Enzymes: No results for input(s): CKTOTAL, CKMB, CKMBINDEX, TROPONINI in the last 168 hours. BNP (last 3 results)  Recent Labs  03/04/14 0726 08/21/14 0029  BNP 1040.0* 1557.0*    ProBNP (last 3 results) No results for input(s): PROBNP in the last 8760 hours.  CBG:  Recent Labs Lab 01/03/15 2019 01/04/15 0029 01/04/15 0518 01/04/15 0718 01/04/15 1125  GLUCAP 91 89 100* 112* 98    Recent Results (from the past 240 hour(s))  MRSA PCR Screening     Status: None   Collection Time: 01/02/15 12:54 AM  Result Value Ref Range Status   MRSA by PCR NEGATIVE NEGATIVE Final    Comment:        The GeneXpert MRSA Assay (FDA approved for NASAL specimens only), is one component of a comprehensive MRSA colonization surveillance program. It is not intended to diagnose MRSA infection nor to guide or monitor treatment for MRSA infections.      Studies: Dg Abd Portable 1v  01/03/2015  CLINICAL DATA:  Small bowel obstruction EXAM: PORTABLE ABDOMEN - 1 VIEW COMPARISON:  CT abdomen and pelvis January 01, 2015 FINDINGS: There is currently no appreciable bowel dilatation or air-fluid level. No free air evident on this supine examination. There is lumbar scoliosis with extensive lumbar arthropathy. There are surgical clips in the pelvis and right upper quadrant regions. There are phleboliths in the pelvis. IMPRESSION: There is no longer appreciable bowel dilatation. The appearance is consistent with resolving bowel obstruction. No free air evident. Advanced arthropathy in the lumbar spine with scoliosis remains. Electronically Signed   By: Lowella Grip III M.D.   On: 01/03/2015 08:50    Scheduled Meds: . heparin  5,000 Units Subcutaneous 3 times per day  . lacosamide (VIMPAT) IV  50 mg Intravenous Q12H  . metoprolol  5 mg Intravenous 4 times per day  .  pantoprazole (PROTONIX) IV  40 mg Intravenous Q24H  . sodium chloride  3 mL Intravenous Q12H   Continuous Infusions: . dextrose 50 mL/hr at 01/04/15 1040       Time spent: 25 minutes    Catherine Mccullough, [place]  [facility] Pager [phone]. If 7PM-7AM, please contact night-coverage at [email or web address], password [facility] 01/04/2015, 2:26 PM  LOS: 3 days

## 2015-01-04 NOTE — Evaluation (Signed)
Occupational Therapy Evaluation Patient Details Name: Catherine Mccullough MRN: [id number] DOB: [birth year] Today's Date: 01/04/2015    History of Present Illness Pt is as 70 year old female admitted with nausea and vomiting.  She is found to have a small bowel obstruction.  She has a hx of breast CA with mets to the bone, CHF, DM, HTN and CKD.  She had been residing at [facility] for rehab following a recent exacerbation of illness.   Clinical Impression   Pt awake and alert this am, asking OT for assistance with feeding tasks. Pt reports nausea, however wants to attempt to eat something. Pt able to perform feeding tasks with set-up and encouragement to feed independently. Pt reports abdominal pain and declines to attempt any bed mobility tasks. Pt currently resides at SNF with total assistance for ADL tasks. Pt will need to return to SNF on discharge to continue working on increasing independence and functioning with ADL tasks, functional mobility tasks, as well as regaining strength and increasing activity tolerance. No further acute OT services required at this time.     Follow Up Recommendations  SNF    Equipment Recommendations  None recommended by OT       Precautions / Restrictions Precautions Precautions: Fall Restrictions Weight Bearing Restrictions: No      Mobility Bed Mobility               General bed mobility comments: Pt unable to mobilize in bed this am due to abdominal pain   Transfers                 General transfer comment: not tested         ADL Overall ADL's : Needs assistance/impaired Eating/Feeding: Set up Eating/Feeding Details (indicate cue type and reason): Pt asked OT for assistance with opening items and feeding tasks. OT encouraged pt to attempt feeding independently, pt able to complete tasks with set-up                                   General ADL Comments: Pt currently requires total assistance with ADL tasks, residing  at [place]?: No apparent visual deficits          Pertinent Vitals/Pain Pain Assessment: 0-10 Pain Score: 10-Worst pain ever Pain Location: stomach Pain Descriptors / Indicators: Constant Pain Intervention(s): Limited activity within patient's tolerance;Monitored during session     Hand Dominance Right   Extremity/Trunk Assessment Upper Extremity Assessment Upper Extremity Assessment: RUE deficits/detail RUE Deficits / Details: Pt with torn rotator cuff, strength 2+/5, ROM 25%   Lower Extremity Assessment Lower Extremity Assessment: Defer to PT evaluation       Communication Communication Communication: No difficulties   Cognition Arousal/Alertness: Awake/alert Behavior During Therapy: WFL for tasks assessed/performed Overall Cognitive Status: Within Functional Limits for tasks assessed                                Home Living Family/patient expects to be discharged to:: Skilled nursing facility Living Arrangements: Alone                                      Prior Functioning/Environment Level of Independence: Needs assistance  Gait / Transfers Assistance Needed: pt  has been at [facility] and has been able to stand to a walker x 1 since being there....she is normally able to ambulate with a 4 wheeled walker and a shoe with a lift ADL's / Homemaking Assistance Needed: Total assist        OT Diagnosis: Generalized weakness   OT Problem List: Decreased strength;Decreased range of motion;Decreased activity tolerance;Pain;Impaired UE functional use    End of Session    Activity Tolerance: Patient limited by pain Patient left: in bed;with call bell/phone within reach;with bed alarm set   Time: 0821-0839 OT Time Calculation (min): 18 min Charges:  OT General Charges $OT Visit: 1 Procedure OT Evaluation $Initial OT Evaluation Tier I: 1 Procedure  Guadelupe Sabin, OTR/L  [phone]  01/04/2015, 8:44  AM

## 2015-01-05 DIAGNOSIS — E162 Hypoglycemia, unspecified: Secondary | ICD-10-CM | POA: Diagnosis present

## 2015-01-05 LAB — BASIC METABOLIC PANEL
Anion gap: 7 (ref 5–15)
BUN: 20 mg/dL (ref 6–20)
CHLORIDE: 105 mmol/L (ref 101–111)
CO2: 23 mmol/L (ref 22–32)
CREATININE: 1.02 mg/dL — AB (ref 0.44–1.00)
Calcium: 8.1 mg/dL — ABNORMAL LOW (ref 8.9–10.3)
GFR calc non Af Amer: 55 mL/min — ABNORMAL LOW (ref >60)
Glucose, Bld: 118 mg/dL — ABNORMAL HIGH (ref 65–99)
Potassium: 3.8 mmol/L (ref 3.5–5.1)
Sodium: 135 mmol/L (ref 135–145)

## 2015-01-05 LAB — GLUCOSE, CAPILLARY
GLUCOSE-CAPILLARY: 105 mg/dL — AB (ref 65–99)
GLUCOSE-CAPILLARY: 90 mg/dL (ref 65–99)
Glucose-Capillary: 103 mg/dL — ABNORMAL HIGH (ref 65–99)
Glucose-Capillary: 104 mg/dL — ABNORMAL HIGH (ref 65–99)
Glucose-Capillary: 69 mg/dL (ref 65–99)
Glucose-Capillary: 77 mg/dL (ref 65–99)
Glucose-Capillary: 94 mg/dL (ref 65–99)

## 2015-01-05 LAB — HEMOGLOBIN A1C
Hgb A1c MFr Bld: 5.5 % (ref 4.8–5.6)
Mean Plasma Glucose: 111 mg/dL

## 2015-01-05 LAB — T4, FREE: FREE T4: 1.23 ng/dL — AB (ref 0.61–1.12)

## 2015-01-05 MED ORDER — LACOSAMIDE 200 MG/20ML IV SOLN
INTRAVENOUS | Status: AC
  Filled 2015-01-05: qty 20

## 2015-01-05 MED ORDER — DEXTROSE 50 % IV SOLN
INTRAVENOUS | Status: AC
  Administered 2015-01-05: 19:00:00
  Filled 2015-01-05: qty 50

## 2015-01-05 MED ORDER — DEXTROSE 50 % IV SOLN
25.0000 mL | Freq: Once | INTRAVENOUS | Status: AC
  Administered 2015-01-05: 25 mL via INTRAVENOUS

## 2015-01-05 MED ORDER — OXYCODONE-ACETAMINOPHEN 10-325 MG PO TABS: 1.0000 | ORAL_TABLET | Freq: Four times a day (QID) | ORAL | Status: DC | PRN

## 2015-01-05 NOTE — Care Management Important Message (Signed)
Important Message  Patient Details  Name: Catherine Mccullough MRN: [id number] Date of Birth: 01-31-1945   Medicare Important Message Given:  Yes    Joylene Draft, RN 01/05/2015, 1:15 PM

## 2015-01-05 NOTE — Progress Notes (Signed)
Notified Dr. Nehemiah Settle of the patient having multiple episodes of vomiting green bile.  Nausea medication given still vomits periodically.  Notified him that the patient had 2 previous episodes of hypoglycemia and tonight's reading was 69 approx. 4:30 and now at approx 1840 it was 77.  The patient states that she feels bad but that is her only complaint.  BP documented per flow sheet.  Patient has had 1 liquid bm that I changed today.  Her NGT tune was d/c'd approx 11 am today.  New orders given and followed.

## 2015-01-05 NOTE — Progress Notes (Signed)
Patient agreeable to placing new feeding tube attachment.  Will continue to monitor.

## 2015-01-05 NOTE — Discharge Summary (Signed)
Physician Discharge Summary  Catherine Mccullough [id number] DOB: 1945/05/25 DOA: 01/01/2015  PCP: Default, Provider, MD  Admit date: 01/01/2015 Discharge date: 01/06/2015  Time spent: 35 minutes  Recommendations for Outpatient Follow-up:  1. Please check a free T4. 2. Please adjust pain medications as tolerated. Please have palliative care follow patient at the facility.    Discharge Diagnoses:  Principal Problem:   She'll SBO (small bowel obstruction) (HCC)   Active Problems:   Breast cancer metastasized to bone [facility])   Dehydration   Right heart failure (HCC)   CKD (chronic kidney disease) stage 4, GFR 15-29 ml/min (HCC)   Essential hypertension   Leukocytosis   Systolic CHF, chronic (HCC)   Protein-calorie malnutrition, severe   Hypoglycemia   Discharge Condition: Fair  Diet recommendation: Regular  Filed Weights   01/03/15 0538 01/04/15 0522 01/05/15 0451  Weight: 48.172 kg (106 lb 3.2 oz) 48.079 kg (105 lb 15.9 oz) 48.1 kg (106 lb 0.7 oz)    History of present illness:  Please refer to admission H&P for details, in brief,69 year old female with history of breast cancer with bone metastases (reports completed treatment at present), severe chronic CHF (EF of 25%), seizure disorder with multiple abdominal surgeries presented from nursing facility with nausea, vomiting and abdominal pain since 1 day. Reports poor appetite with nonbilious vomiting. Denied any fevers, chills, diarrhea. In the ED she was found to have WBC of 19,000. CT abdomen and pelvis nodular small bowel obstruction with transition point in the distal ileum also found to have a stricture at the proximal gaseous colon and cecal distention. Admitted to hospitalist service.  Hospital Course:  Partial small bowel obstruction NG tube placed to suction initially draining bilious fluid now improved. Pain control with when necessary pain medications and antiemetics. Follow-up abdominal x-ray  shows  resolution of small bowel obstruction. Diet advanced to full liquid. If tolerating soft diet can be discharged back to the facility.  Dehydration Received IV normal saline bolus in the ED.  diuretics held. euvolemic at present   Recurrent hypoglycemia FSG drop into 50s. Was placed on D5 then switched to D10 drip. Requiring intermittent D50.. patient was nothing by mouth. Normal cortisol level. TSH 4 weeks back was normal.. She was found to have 1-2 episodes of hypoglycemia during recent hospitalization as well. I see her diagnosis of diabetes mellitus in her chart but she is not on any medications or has recent A1c. A1c of 5.5. TSH repeated and found to be suppressed. Ordered free T4. Take insulin antibodies. Will monitor her off D10 and on advanced diet.  Chronic systolic CHF Hypovolemic on presentation. Holding diuretics. Can be resumed upon discharge.  CKD stage 3-4 Stable.  Leukocytosis Appears to be reactive on admission. Now normalized. Lactic acid normal.  Seizure disorder Continue Vimpat  Breast cancer with bony metastases Follow-up with oncology as outpatient. Was seen earlier this month by palliative care and patient wished to be DO NOT RESUSCITATE. Recommend palliative care follow-up at the facility.  Protein calorie malnutrition, severe Dietitian consulted.     Code Status: DNR, has poor long-term prognosis. Family Communication: none at bedside Disposition Plan: return to SNF on 12/31   Consultants:  none  Procedures:  CT abdomen  Antibiotics:  none    Discharge Exam: Filed Vitals:   01/05/15 0451 01/05/15 1217  BP: 112/62 150/58  Pulse: 97 95  Temp: 98.2 F (36.8 C) 97.8 F (36.6 C)  Resp: 18 20    General: elderly thin built  female not in distress  HEENT: Moist mucosa, supple neck, temporal wasting  Chest: Clear Bilaterally   Cardiovascular: Normal S1 S2, no murmurs   GI: Soft, nondistended, mild tenderness to  palpation  Musculoskeletal: Warm, no edema  CNS: Alert and oriented  Discharge Instructions    Current Discharge Medication List    CONTINUE these medications which have CHANGED   Details  oxyCODONE-acetaminophen (PERCOCET) 10-325 MG tablet Take 1 tablet by mouth every 6 (six) hours as needed for pain. Qty: 30 tablet, Refills: 0      CONTINUE these medications which have NOT CHANGED   Details  acetaminophen (TYLENOL) 500 MG tablet Take 1,000 mg by mouth 3 (three) times daily.    aspirin EC 81 MG tablet Take 81 mg by mouth daily.    bumetanide (BUMEX) 1 MG tablet Take 1 mg by mouth 2 (two) times daily.     cetirizine (ZYRTEC) 10 MG tablet Take 5 mg by mouth daily as needed for allergies.    Cholecalciferol (VITAMIN D3) 5000 UNITS CAPS Take 1 capsule by mouth daily.    citalopram (CELEXA) 10 MG tablet Take 10 mg by mouth daily.    gentamicin (GARAMYCIN) 0.3 % ophthalmic solution Place 1 drop into the left eye 3 (three) times daily.    lacosamide (VIMPAT) 50 MG TABS tablet Take 1 tablet (50 mg total) by mouth 2 (two) times daily. Qty: 60 tablet, Refills: 3    losartan (COZAAR) 25 MG tablet Take 1 tablet (25 mg total) by mouth daily. Medication for your heart Qty: 30 tablet, Refills: 3    metoprolol succinate (TOPROL-XL) 100 MG 24 hr tablet Take 100 mg by mouth 2 (two) times daily. Take with or immediately following a meal.    omeprazole (PRILOSEC) 40 MG capsule Take 40 mg by mouth daily.    pregabalin (LYRICA) 75 MG capsule Take 75 mg by mouth 3 (three) times daily.    promethazine (PHENERGAN) 25 MG tablet Take 25 mg by mouth every 6 (six) hours as needed for nausea or vomiting.    sodium bicarbonate 650 MG tablet Take 1 tablet (650 mg total) by mouth 2 (two) times daily. Qty: 60 tablet, Refills: 3    vitamin C (ASCORBIC ACID) 500 MG tablet Take 500 mg by mouth daily.      STOP taking these medications     diclofenac sodium (VOLTAREN) 1 % GEL        Allergies   Allergen Reactions  . Mometasone Shortness Of Breath  . Vancomycin Itching  . Aspirin Other (See Comments)    "Inflames stomach"  . Augmentin [Amoxicillin-Pot Clavulanate]     Unknown reaction  . Contrast Media [Iodinated Diagnostic Agents] Other (See Comments)    Made Heart Stop.   . Cortisone Other (See Comments)    Hold fluid  . Doxycycline Nausea And Vomiting  . Ibuprofen Nausea And Vomiting  . Insulins Other (See Comments)    Pt says even the tiniest bit of insulin makes her go unconscious because her BS gets too low  . Iodine     Unknown reaction  . Lasix [Furosemide] Other (See Comments)    Paradoxical Response  . Other     Iv bp med unknown.and adhesive tape-silicones  . Prednisone     Sweating   . Sulfamethoxazole Other (See Comments)    Bottomed out platelets  . Tape   . Ultram [Tramadol Hcl] Other (See Comments)    "Grand mal seizure"  . Codeine Rash  .  Dilantin [Phenytoin Sodium] Rash  . Latex Rash  . Piperacillin Sod-Tazobactam So Rash   Follow-up Information    Please follow up.   Why:  MD at SNF       The results of significant diagnostics from this hospitalization (including imaging, microbiology, ancillary and laboratory) are listed below for reference.    Significant Diagnostic Studies: Ct Abdomen Pelvis Wo Contrast  12/11/2014  CLINICAL DATA:  Diarrhea sepsis, metastatic breast cancer EXAM: CT ABDOMEN AND PELVIS WITHOUT CONTRAST TECHNIQUE: Multidetector CT imaging of the abdomen and pelvis was performed following the standard protocol without IV contrast. COMPARISON:  01/14/2011 FINDINGS: Bilateral pleural effusion with bilateral basilar posterior atelectasis again noted. There is small perihepatic ascites. Moderate perisplenic ascites. Postsurgical changes are noted GE junction region. The patient is status postcholecystectomy. Atherosclerotic calcifications of abdominal aorta and iliac arteries. No aortic aneurysm. Mild anasarca infiltration  subcutaneous wall abdomen and pelvis. Unenhanced she pancreas spleen and adrenal glands are unremarkable. Unenhanced kidneys shows no nephrolithiasis. No hydronephrosis or hydroureter. Mild fluid distended small bowel loops with some air-fluid level suspicious for ileus or enteritis. No transition point in caliber of small bowel to suggest small bowel obstruction. There is some liquid within right colon suspicious for diarrhea. Clinical correlation is necessary. The terminal ileum is unremarkable. Moderate pelvic ascites. There is a Foley catheter within decompressed urinary bladder. Small amount of air within bladder is probable post instrumentation. The patient is status post hysterectomy. Pelvic phleboliths are again noted. Mild levoscoliosis and degenerative changes lumbar spine. There is sclerosis of L2 vertebral body metastatic disease cannot be excluded. Degenerative changes pubic symphysis. Old appearing left lower anterior rib fractures with incomplete healing. There is a right femoral vein catheter with tip iliac vein. IMPRESSION: 1. Bilateral pleural effusion with bilateral posterior atelectasis again noted. Old appearing left lower anterior rib fractures with incomplete healing/union. 2. Again noted anasarca.  Status postcholecystectomy. 3. Small perihepatic ascites.  Moderate perisplenic ascites. 4. Mild fluid distended small bowel loops with some air-fluid level suspicious for ileus or enteritis. Some fluid is noted in right colon suspicious for diarrhea. No transition point in caliber of small bowel to suggest small bowel obstruction. 5. There is a Foley catheter within decompressed urinary bladder. 6. Status post hysterectomy. 7. Degenerative changes lumbar spine. Sclerosis of L2 vertebral body. Metastatic disease cannot be excluded Electronically Signed   By: Lahoma Crocker M.D.   On: 12/11/2014 11:47   Dg Abd 1 View  12/10/2014  CLINICAL DATA:  Abdominal pain EXAM: ABDOMEN - 1 VIEW COMPARISON:   None. FINDINGS: Nonobstructive bowel gas pattern. Moderate lumbar levoscoliosis with mild degenerative changes. Visualized bony pelvis appears intact. IMPRESSION: Unremarkable abdominal radiograph. Electronically Signed   By: Julian Hy M.D.   On: 12/10/2014 16:33   Dg Chest Port 1 View  12/08/2014  CLINICAL DATA:  Increased fluid retention. Altered mental status with GI bleed and respiratory distress. History of seizures, congestive heart failure, diabetes, hypertension, peptic ulcer disease and metastatic breast cancer. EXAM: PORTABLE CHEST 1 VIEW COMPARISON:  12/05/2014 and 12/04/2014. FINDINGS: The endotracheal tube tip remains low, approximately 2 cm above the carina. There is increased patient rotation to the right. The heart size and mediastinal contours are stable. Bibasilar atelectasis and probable small bilateral pleural effusions have not significantly changed. No evidence of pneumothorax. IMPRESSION: No significant change in bibasilar airspace opacities and pleural effusions. The endotracheal tube tip remains low. Electronically Signed   By: Richardean Sale M.D.   On: 12/08/2014  21:46   Dg Abd Portable 1v  01/03/2015  CLINICAL DATA:  Small bowel obstruction EXAM: PORTABLE ABDOMEN - 1 VIEW COMPARISON:  CT abdomen and pelvis January 01, 2015 FINDINGS: There is currently no appreciable bowel dilatation or air-fluid level. No free air evident on this supine examination. There is lumbar scoliosis with extensive lumbar arthropathy. There are surgical clips in the pelvis and right upper quadrant regions. There are phleboliths in the pelvis. IMPRESSION: There is no longer appreciable bowel dilatation. The appearance is consistent with resolving bowel obstruction. No free air evident. Advanced arthropathy in the lumbar spine with scoliosis remains. Electronically Signed   By: Lowella Grip III M.D.   On: 01/03/2015 08:50   Ct Renal Stone Study  01/01/2015  CLINICAL DATA:  Generalized  abdominal pain and vomiting for 1 day. EXAM: CT ABDOMEN AND PELVIS WITHOUT CONTRAST TECHNIQUE: Multidetector CT imaging of the abdomen and pelvis was performed following the standard protocol without IV contrast. COMPARISON:  12/11/2014 FINDINGS: Lower chest: Small right pleural effusion. Atherosclerotic disease of the coronary arteries. Hepatobiliary: No mass visualized on this un-enhanced exam. Status post cholecystectomy. Pancreas: The pancreas is atrophic. Coarse calcifications are seen within the head of the pancreas or within the peripancreatic fat. Spleen: Within normal limits in size. Adrenals/Urinary Tract: No evidence of urolithiasis or hydronephrosis. No definite mass visualized on this un-enhanced exam. Right kidney is atrophic. Stomach/Bowel: There is a diffuse fluid distention of small bowel loops to the level of the terminal ileum with maximum diameter of 37 mm. The transitional point appears to be at the level of the distal ileum in the right lower quadrant, and with there are inflammatory mesenteric changes. The cecum is distended. The transverse and left colon and decompressed. Un apparent abrupt narrowing is seen at the level of the proximal transverse colon comminuted transitional point of small-bowel obstruction. Vascular/Lymphatic: No pathologically enlarged lymph nodes. No evidence of abdominal aortic aneurysm. Atherosclerotic disease of the aorta is seen. Reproductive: No mass or other significant abnormality. Other: Moderate amount of abdominal ascites.  Anasarca. Musculoskeletal: S shaped scoliosis with secondary osteoarthritic changes. Mottled appear over a rounds of L2 vertebral body may be metastatic. IMPRESSION: Small bowel obstruction with transitional point at the distal ileum. In cross proximity, there is also an apparent narrowing of the proximal transverse colon causing enlargement of the cecum. Ascites. Coarse calcifications within an atrophic appearing head of the pancreas. This  may represent a chronic pancreatitis, or low grade pancreatic malignancy. This could be further evaluated with MRI when found clinically appropriate. Mottled appearance of L2 vertebral body, which may represent a metastatic lesion. Anasarca. Small right pleural effusion. Electronically Signed   By: Fidela Salisbury M.D.   On: 01/01/2015 21:31    Microbiology: Recent Results (from the past 240 hour(s))  MRSA PCR Screening     Status: None   Collection Time: 01/02/15 12:54 AM  Result Value Ref Range Status   MRSA by PCR NEGATIVE NEGATIVE Final    Comment:        The GeneXpert MRSA Assay (FDA approved for NASAL specimens only), is one component of a comprehensive MRSA colonization surveillance program. It is not intended to diagnose MRSA infection nor to guide or monitor treatment for MRSA infections.      Labs: Basic Metabolic Panel:  Recent Labs Lab 01/01/15 2032 01/02/15 0655 01/03/15 0639 01/04/15 0636 01/05/15 0640  NA 140 142 140 139 135  K 3.6 3.3* 4.3 4.1 3.8  CL 105 108 109  108 105  CO2 21* 24 24 25 23   GLUCOSE 114* 92 81 116* 118*  BUN 22* 27* 27* 23* 20  CREATININE 1.11* 1.30* 1.42* 1.17* 1.02*  CALCIUM 10.0 9.1 8.4* 8.4* 8.1*   Liver Function Tests:  Recent Labs Lab 01/01/15 2032  AST 18  ALT 9*  ALKPHOS 194*  BILITOT 0.9  PROT 8.4*  ALBUMIN 3.1*   No results for input(s): LIPASE, AMYLASE in the last 168 hours. No results for input(s): AMMONIA in the last 168 hours. CBC:  Recent Labs Lab 01/01/15 2032 01/03/15 0639  WBC 19.4* 10.4  NEUTROABS 17.4*  --   HGB 12.1 8.9*  HCT 37.2 27.7*  MCV 93.9 97.5  PLT 390 285   Cardiac Enzymes: No results for input(s): CKTOTAL, CKMB, CKMBINDEX, TROPONINI in the last 168 hours. BNP: BNP (last 3 results)  Recent Labs  03/04/14 0726 08/21/14 0029  BNP 1040.0* 1557.0*    ProBNP (last 3 results) No results for input(s): PROBNP in the last 8760 hours.  CBG:  Recent Labs Lab 01/04/15 2045  01/05/15 0041 01/05/15 0442 01/05/15 0734 01/05/15 1134  GLUCAP 98 103* 104* 105* 94       Signed:  Louellen Molder MD [facility] 01/05/2015, 1:22 PM

## 2015-01-05 NOTE — Clinical Social Work Note (Signed)
CSW updated [facility] on pt. Aware of probable d/c tomorrow.  Catherine Mccullough, [place]

## 2015-01-05 NOTE — Care Management Note (Signed)
Case Management Note  Patient Details  Name: Catherine Mccullough MRN: [id number] Date of Birth: 07/23/1945  Subjective/Objective:                    Action/Plan:   Expected Discharge Date:                  Expected Discharge Plan:  Skilled Nursing Facility  In-House Referral:  Clinical Social Work  Discharge planning Services  CM Consult  Post Acute Care Choice:  NA Choice offered to:  NA  DME Arranged:    DME Agency:     [name]:    [place] Agency:     Status of Service:  Completed, signed off  Medicare Important Message Given:  Yes Date Medicare IM Given:    Medicare IM give by:    Date Additional Medicare IM Given:    Additional Medicare Important Message give by:     If discussed at [place] of Stay Meetings, dates discussed:    Additional Comments: Anticipate discharge over the weekend back to [place]. CSW to arrange discharge once written. Christinia Gully [place], RN 01/05/2015, 1:15 PM

## 2015-01-05 NOTE — Progress Notes (Signed)
NG tube remains clamped, no nausea or vomiting this shift.  Clamp holding NG in place turning loose from nose, patient refused new attachment device, cleaned nose with skin protectant and reappplied holder.  Will continue to monitor NG.

## 2015-01-06 ENCOUNTER — Inpatient Hospital Stay (HOSPITAL_COMMUNITY): Payer: Medicare Other

## 2015-01-06 LAB — BASIC METABOLIC PANEL
Anion gap: 8 (ref 5–15)
BUN: 19 mg/dL (ref 6–20)
CHLORIDE: 105 mmol/L (ref 101–111)
CO2: 22 mmol/L (ref 22–32)
Calcium: 8.7 mg/dL — ABNORMAL LOW (ref 8.9–10.3)
Creatinine, Ser: 1.04 mg/dL — ABNORMAL HIGH (ref 0.44–1.00)
GFR, EST NON AFRICAN AMERICAN: 54 mL/min — AB (ref >60)
Glucose, Bld: 102 mg/dL — ABNORMAL HIGH (ref 65–99)
POTASSIUM: 4.9 mmol/L (ref 3.5–5.1)
SODIUM: 135 mmol/L (ref 135–145)

## 2015-01-06 LAB — GLUCOSE, CAPILLARY
GLUCOSE-CAPILLARY: 73 mg/dL (ref 65–99)
GLUCOSE-CAPILLARY: 73 mg/dL (ref 65–99)
Glucose-Capillary: 119 mg/dL — ABNORMAL HIGH (ref 65–99)
Glucose-Capillary: 80 mg/dL (ref 65–99)
Glucose-Capillary: 85 mg/dL (ref 65–99)
Glucose-Capillary: 87 mg/dL (ref 65–99)
Glucose-Capillary: 98 mg/dL (ref 65–99)

## 2015-01-06 MED ORDER — LORAZEPAM 2 MG/ML IJ SOLN: 0.5000 mg | Freq: Four times a day (QID) | INTRAMUSCULAR | Status: DC | PRN

## 2015-01-06 MED ORDER — LACOSAMIDE 200 MG/20ML IV SOLN
INTRAVENOUS | Status: AC
  Filled 2015-01-06: qty 20

## 2015-01-06 MED ORDER — SODIUM CHLORIDE 0.9 % IV SOLN
INTRAVENOUS | Status: DC
  Administered 2015-01-06 – 2015-01-07 (×2): via INTRAVENOUS

## 2015-01-06 MED ORDER — DEXTROSE 50 % IV SOLN
INTRAVENOUS | Status: AC
Start: 2015-01-06 — End: 2015-01-07
  Filled 2015-01-06: qty 50

## 2015-01-06 MED ORDER — DEXTROSE 50 % IV SOLN: 25.0000 mL | Freq: Once | INTRAVENOUS | Status: DC

## 2015-01-06 NOTE — Progress Notes (Signed)
Pt with new order for telemetry. Pt is refusing to be put on telemetry tonight but said that she would go on the heart monitor tomorrow after talking to the MD.

## 2015-01-06 NOTE — Progress Notes (Signed)
Terxt paged Dr. Sarajane Jews of the results for the patients abs xray report.  MD states he spoke to her and she refuses surgery and  NGT.  Pt voiced to him she had bms everyday.  I voiced to him that I know she had one yesterday and that it was running like water.  He states that we going to keep her NPO and continue to monitor her.  Voiced to him that her CBG's have been running low and that I was going to continue to monitor her.  No further orders at this time.

## 2015-01-06 NOTE — Progress Notes (Addendum)
[facility]  PROGRESS NOTE  Catherine Mccullough [id number] DOB: 03-10-1945 DOA: 01/01/2015 PCP: Default, Provider, MD  Summary: 3 yow with PMH of breast cancer wit bone metastases, chronic CHF, seizure disorder, and multiple abdominal surgeries presented from SNF with complaints of nausea, vomiting, abdominal pain and associated decrease in appetite. While in the ED noted to have WBC of 19 and CT abdomen and pelvis nodular small bowel obstruction with transition point in the distal ileum also found to have a stricture at the proximal gaseous colon and cecal distention. Admitted for SBO.  Assessment/Plan: 1. Persistent SBO, clinically partial with liquid bowel movements. Afebrile, benign exam.  2. Dehydration, resolved. Received IVF in the ED.   3. Recurrent hypoglycemia, resolved. 4. Chronic systolic CHF. Appears euvolemic. Diuretics held. Can resume upon discharge. 5. CKD Stage II-III. Stable 6. Seizure disorder. Stable Continue Vimpat  7. Breast cancer with bony metastases. Follow up with oncology as outpatient. 8. Severe protein calorie malnutrition, in the setting of acute illness/injury. Nutrition following.    Appears stable but unable to tolerate diet.  Repeat Xray shows SBO. Passing liquid stool. Patient without pain on exam. Discussed with Dr. Arnoldo Morale, poor surgical candidate given complexity, consider transfer. Long conversation at bedside with patient, reviewed findings of AXR. I recommended transfer to higher level of care, detailed risk of death from recurrent SBO and recs for transfer. She reports "I don't care if I die, I am not having surgery." Refuses NGT. Reiterates that she does not wish for surgery. Alert and understands condition, recommended treatments and implications of refusal. Does not want to transfer. Has capacity.  NPO, IVF, labs in AM. AXR in AM.  Code Status: DNR DVT prophylaxis: Heparin  Family Communication: Discussed with patient who understands and has no  concerns at this time. Disposition Plan: Anticipate discharge in 1-2 days.   Catherine Hodgkins, MD  [facility]  Pager [phone] If 7PM-7AM, please contact night-coverage at [email or web address], password [facility] 01/06/2015, 8:47 AM  LOS: 5 days   Consultants:  PT/OT- Discharge to SNF.   Procedures:    Antibiotics:    HPI/Subjective: No abd pain currently. Dry heaves, bile coming up. Will not have NGT again. Reports multiple liquid stools since admission, most recent today.  Nurse reports the patient vomiting bile overnight.   Objective: Filed Vitals:   01/05/15 1300 01/05/15 1900 01/05/15 2043 01/06/15 0613  BP: 130/60 136/59 129/55 126/59  Pulse: 94 91 90 87  Temp:   98.7 F (37.1 C) 98.5 F (36.9 C)  TempSrc:   Oral Oral  Resp:   18 18  Height:      Weight:    48.7 kg (107 lb 5.8 oz)  SpO2:   100% 100%   No intake or output data in the 24 hours ending 01/06/15 0847   Filed Weights   01/04/15 0522 01/05/15 0451 01/06/15 0613  Weight: 48.079 kg (105 lb 15.9 oz) 48.1 kg (106 lb 0.7 oz) 48.7 kg (107 lb 5.8 oz)    Exam:  Afebrile, VSS not hypoxic.  General:  Appears calm and comfortable lying in bed Cardiovascular: RRR, no m/r/g. No LE edema. Respiratory: CTA bilaterally, no w/r/r. Normal respiratory effort. Abdomen: Mild generalized tenderness. non distended. Positive bowel sounds.  Musculoskeletal: grossly normal tone BUE/BLE Psychiatric: grossly normal mood and affect, speech fluent and appropriate Neurologic: grossly non-focal.  New data reviewed:  Potassium 4.9  BMP unremarkable  AXR shows persistent SBO  Scheduled Meds: . heparin  5,000 Units Subcutaneous 3 times  per day  . lacosamide (VIMPAT) IV  50 mg Intravenous Q12H  . metoprolol  5 mg Intravenous 4 times per day  . pantoprazole (PROTONIX) IV  40 mg Intravenous Q24H  . sodium chloride  3 mL Intravenous Q12H   Continuous Infusions:   Principal Problem:   SBO (small bowel obstruction)  (HCC) Active Problems:   Breast cancer metastasized to bone [facility])   Dehydration   Right heart failure (HCC)   CKD (chronic kidney disease) stage 4, GFR 15-29 ml/min (HCC)   Essential hypertension   Leukocytosis   Systolic CHF, chronic (HCC)   Protein-calorie malnutrition, severe   Hypoglycemia   Time spent 35 minutes, >50% in counseling, coordination of care      By signing my name below, I, Rennis Harding attest that this documentation has been prepared under the direction and in the presence of Catherine Hodgkins, MD Electronically signed: Rennis Harding  01/06/2015 10:31am   I personally performed the services described in this documentation. All medical record entries made by the scribe were at my direction. I have reviewed the chart and agree that the record reflects my personal performance and is accurate and complete. Catherine Hodgkins, MD

## 2015-01-06 NOTE — Progress Notes (Signed)
Notifed night coverage for tonite of the patients CBG range 72-77.  Gave her 25 ml D50 now 119.  Notified Christy night nurse of MD notified via text, intervention, and latest CBG. She verbalized understanding.

## 2015-01-07 ENCOUNTER — Inpatient Hospital Stay (HOSPITAL_COMMUNITY): Payer: Medicare Other

## 2015-01-07 LAB — CBC
HEMATOCRIT: 34.4 % — AB (ref 36.0–46.0)
HEMOGLOBIN: 11.2 g/dL — AB (ref 12.0–15.0)
MCH: 30.9 pg (ref 26.0–34.0)
MCHC: 32.6 g/dL (ref 30.0–36.0)
MCV: 94.8 fL (ref 78.0–100.0)
PLATELETS: 241 10*3/uL (ref 150–400)
RBC: 3.63 MIL/uL — AB (ref 3.87–5.11)
RDW: 17.8 % — ABNORMAL HIGH (ref 11.5–15.5)
WBC: 10.2 10*3/uL (ref 4.0–10.5)

## 2015-01-07 LAB — COMPREHENSIVE METABOLIC PANEL
ALT: 9 U/L — AB (ref 14–54)
ANION GAP: 11 (ref 5–15)
AST: 16 U/L (ref 15–41)
Albumin: 2.9 g/dL — ABNORMAL LOW (ref 3.5–5.0)
Alkaline Phosphatase: 240 U/L — ABNORMAL HIGH (ref 38–126)
BUN: 16 mg/dL (ref 6–20)
CHLORIDE: 104 mmol/L (ref 101–111)
CO2: 23 mmol/L (ref 22–32)
CREATININE: 1.03 mg/dL — AB (ref 0.44–1.00)
Calcium: 9.3 mg/dL (ref 8.9–10.3)
GFR, EST NON AFRICAN AMERICAN: 55 mL/min — AB (ref >60)
Glucose, Bld: 90 mg/dL (ref 65–99)
POTASSIUM: 4.5 mmol/L (ref 3.5–5.1)
SODIUM: 138 mmol/L (ref 135–145)
Total Bilirubin: 1 mg/dL (ref 0.3–1.2)
Total Protein: 7.5 g/dL (ref 6.5–8.1)

## 2015-01-07 LAB — GLUCOSE, CAPILLARY
GLUCOSE-CAPILLARY: 81 mg/dL (ref 65–99)
GLUCOSE-CAPILLARY: 95 mg/dL (ref 65–99)
Glucose-Capillary: 57 mg/dL — ABNORMAL LOW (ref 65–99)
Glucose-Capillary: 58 mg/dL — ABNORMAL LOW (ref 65–99)
Glucose-Capillary: 62 mg/dL — ABNORMAL LOW (ref 65–99)
Glucose-Capillary: 76 mg/dL (ref 65–99)
Glucose-Capillary: 76 mg/dL (ref 65–99)
Glucose-Capillary: 83 mg/dL (ref 65–99)
Glucose-Capillary: 85 mg/dL (ref 65–99)

## 2015-01-07 MED ORDER — LIDOCAINE VISCOUS 2 % MT SOLN
15.0000 mL | Freq: Once | OROMUCOSAL | Status: AC
  Administered 2015-01-07: 15 mL via OROMUCOSAL
  Filled 2015-01-07: qty 15

## 2015-01-07 MED ORDER — DEXTROSE 50 % IV SOLN
25.0000 mL | Freq: Once | INTRAVENOUS | Status: AC
  Administered 2015-01-07: 25 mL via INTRAVENOUS

## 2015-01-07 MED ORDER — BISACODYL 10 MG RE SUPP
10.0000 mg | Freq: Two times a day (BID) | RECTAL | Status: DC
  Administered 2015-01-07 – 2015-01-08 (×3): 10 mg via RECTAL
  Filled 2015-01-07 (×5): qty 1

## 2015-01-07 MED ORDER — GLUCOSE 40 % PO GEL
ORAL | Status: AC
  Administered 2015-01-07: 37.5 g
  Filled 2015-01-07: qty 1

## 2015-01-07 NOTE — Progress Notes (Signed)
[facility] PROGRESS NOTE  Catherine Mccullough [id number] DOB: 03-Sep-1945 DOA: 01/01/2015 PCP: Default, Provider, MD  Assessment/Plan: 1. Persistent SBO, clinically partial with liquid bowel movements. Per chart patient is refusing surgery and NGT. Will keep NPO. She is still reculant to pursue surgery  At this time but may possibly consider it as a last option. peripheral IV  has been placed. Discussed in detail the importance of NGT as well as risk of scheduled frequent narcotic dosing and pros and cons treatment. She is agreeable to attempt NGT placement after adminstration of Lidocaine. Request that I call EVA listed below for help with further medical decisions. Encourage ambulation. 2. Dehydration, resolved. Received IVF in the ED.  3. Recurrent hypoglycemia, likely related to decreased PO intake. Patient noted to have hypoglycemic episode overnight and given 1 tube instant glucose with improvement in blood sugars.    4. Chronic systolic CHF. Appears compensated. Diuretics held. Can resume upon discharge. 5. CKD Stage II-III. Stable. Creatinine at baseline. 6. Seizure disorder. Stable Continue Vimpat  7. Breast cancer with bony metastases. Follow up with oncology as outpatient. 8. Severe protein calorie malnutrition, in the setting of acute illness/injury. Nutrition following.  Code Status: DNR DVT prophylaxis:Heparin  Family Communication: No family bedside. Discussed with patient who understands and has no concerns at this time. Request I call Drue Novel [phone] Disposition Plan: Anticipate discharge within 2-3 days.    Consultants:  PT/OT-Discharge to SNF.   Procedures:  None   Antibiotics:  None  HPI/Subjective: Abdominal pain. She is aware of the obstruction. Moderate vomiting. Admits to passing flatus and bowel movements. She has had nothing to drink today. She also believes her abdomen has gotten smaller. She is adamant about not having surgery or NGT,  even as last resort.    Per nurse, patient is still vomiting but is refusing medical options provided.   Objective: Filed Vitals:   01/06/15 2318 01/07/15 0620  BP: 128/24 126/47  Pulse: 101 110  Temp: 98 F (36.7 C) 99 F (37.2 C)  Resp: 18 18    Intake/Output Summary (Last 24 hours) at 01/07/15 0800 Last data filed at 01/06/15 1200  Gross per 24 hour  Intake      0 ml  Output      0 ml  Net      0 ml   Filed Weights   01/05/15 0451 01/06/15 0613 01/07/15 0620  Weight: 48.1 kg (106 lb 0.7 oz) 48.7 kg (107 lb 5.8 oz) 50 kg (110 lb 3.7 oz)    Exam:  General: NAD Cardiovascular: Tachycardiac  Respiratory: clear bilaterally, No wheezing, rales or rhonchi Abdomen:  Mild tenderness through abdomen, soft no distention Musculoskeletal: No edema b/l  Data Reviewed: Basic Metabolic Panel:  Recent Labs Lab 01/02/15 0655 01/03/15 0639 01/04/15 0636 01/05/15 0640 01/06/15 0628  NA 142 140 139 135 135  K 3.3* 4.3 4.1 3.8 4.9  CL 108 109 108 105 105  CO2 24 24 25 23 22   GLUCOSE 92 81 116* 118* 102*  BUN 27* 27* 23* 20 19  CREATININE 1.30* 1.42* 1.17* 1.02* 1.04*  CALCIUM 9.1 8.4* 8.4* 8.1* 8.7*   Liver Function Tests:  Recent Labs Lab 01/01/15 2032  AST 18  ALT 9*  ALKPHOS 194*  BILITOT 0.9  PROT 8.4*  ALBUMIN 3.1*   CBC:  Recent Labs Lab 01/01/15 2032 01/03/15 0639  WBC 19.4* 10.4  NEUTROABS 17.4*  --   HGB 12.1 8.9*  HCT 37.2 27.7*  MCV 93.9 97.5  PLT 390 285     Recent Labs  03/04/14 0726 08/21/14 0029  BNP 1040.0* 1557.0*   CBG:  Recent Labs Lab 01/07/15 0024 01/07/15 0054 01/07/15 0140 01/07/15 0418 01/07/15 0729  GLUCAP 57* 58* 83 85 76    Recent Results (from the past 240 hour(s))  MRSA PCR Screening     Status: None   Collection Time: 01/02/15 12:54 AM  Result Value Ref Range Status   MRSA by PCR NEGATIVE NEGATIVE Final    Comment:        The GeneXpert MRSA Assay (FDA approved for NASAL specimens only), is one  component of a comprehensive MRSA colonization surveillance program. It is not intended to diagnose MRSA infection nor to guide or monitor treatment for MRSA infections.      Studies: Dg Abd Portable 1v  01/06/2015  CLINICAL DATA:  SBO.  Abdominal pain. EXAM: PORTABLE ABDOMEN - 1 VIEW COMPARISON:  01/03/2015. FINDINGS: There is persistent dilatation numerous central small bowel loops, without significant change. Paucity of large bowel gas. Surgical clips are noted in the right upper quadrant and pelvis. No evidence for free intraperitoneal air on this supine view of the abdomen. There is significant scoliosis of the lumbar spine, convex to the left. Significant sclerosis of the lumbar spine. IMPRESSION: Persistent small bowel dilatation consistent with small-bowel obstruction. Electronically Signed   By: Nolon Nations M.D.   On: 01/06/2015 11:53    Scheduled Meds: . dextrose  25 mL Intravenous Once  . heparin  5,000 Units Subcutaneous 3 times per day  . lacosamide (VIMPAT) IV  50 mg Intravenous Q12H  . metoprolol  5 mg Intravenous 4 times per day  . pantoprazole (PROTONIX) IV  40 mg Intravenous Q24H  . sodium chloride  3 mL Intravenous Q12H   Continuous Infusions: . sodium chloride 75 mL/hr at 01/06/15 1323    Principal Problem:   SBO (small bowel obstruction) (HCC) Active Problems:   Breast cancer metastasized to bone [facility])   Dehydration   Right heart failure (HCC)   CKD (chronic kidney disease) stage 4, GFR 15-29 ml/min (HCC)   Essential hypertension   Leukocytosis   Systolic CHF, chronic (HCC)   Protein-calorie malnutrition, severe   Hypoglycemia    Time spent: 40 minute     Kathie Dike, MD  [facility] Pager [phone]. If 7PM-7AM, please contact night-coverage at [email or web address], password [facility] 01/07/2015, 8:00 AM  LOS: 6 days     By signing my name below, I, Rennis Harding, attest that this documentation has been prepared under the direction and in  the presence of Kathie Dike, MD. Electronically signed: Rennis Harding, Scribe. 01/07/2015 12:39pm   I, Dr. Kathie Dike, personally performed the services described in this documentaiton. All medical record entries made by the scribe were at my direction and in my presence. I have reviewed the chart and agree that the record reflects my personal performance and is accurate and complete  Kathie Dike, MD, 01/07/2015 1:30 PM

## 2015-01-07 NOTE — Progress Notes (Signed)
IV site leaking, IV removed. Attempted IV restart by 2 RN's without access. Mid-level paged and orders placed for PICC in AM.

## 2015-01-07 NOTE — Progress Notes (Signed)
Notified [facility] to notify them the patient would need PICC placement.  Spoke with Kirk Ruths and he stated that someone should be here by 11 am for placement.

## 2015-01-07 NOTE — Progress Notes (Signed)
Hypoglycemic Event  CBG:57  Treatment: 1 tube instant glucose  Symptoms: None  Follow-up CBG: Time: CBG Result82  Possible Reasons for Event: Inadequate meal intake  Comments/MD notified:follow hypoglycemic protocol    Marcello Moores, Jeralene Peters

## 2015-01-08 LAB — GLUCOSE, CAPILLARY
GLUCOSE-CAPILLARY: 113 mg/dL — AB (ref 65–99)
GLUCOSE-CAPILLARY: 52 mg/dL — AB (ref 65–99)
Glucose-Capillary: 72 mg/dL (ref 65–99)
Glucose-Capillary: 87 mg/dL (ref 65–99)

## 2015-01-08 MED ORDER — DEXTROSE 50 % IV SOLN
INTRAVENOUS | Status: AC
  Administered 2015-01-08: 01:00:00
  Filled 2015-01-08: qty 50

## 2015-01-08 MED ORDER — KCL IN DEXTROSE-NACL 20-5-0.45 MEQ/L-%-% IV SOLN
INTRAVENOUS | Status: DC
  Administered 2015-01-08 – 2015-01-10 (×4): via INTRAVENOUS

## 2015-01-08 MED ORDER — DEXTROSE 50 % IV SOLN
25.0000 mL | Freq: Once | INTRAVENOUS | Status: AC
  Administered 2015-01-08: 25 mL via INTRAVENOUS

## 2015-01-08 MED ORDER — POTASSIUM CHLORIDE 2 MEQ/ML IV SOLN: INTRAVENOUS | Status: DC

## 2015-01-08 NOTE — Progress Notes (Signed)
FYI to MD - Follow-up to 2nd hypoglycemic event - 20 min after 1/2 of D50 was given pt's CBG was 113,

## 2015-01-08 NOTE — Progress Notes (Signed)
[facility] PROGRESS NOTE  Catherine Mccullough [id number] DOB: 27-Nov-1945 DOA: 01/01/2015 PCP: Default, Provider, MD  Assessment/Plan: 1. Persistent SBO, clinically partial with liquid bowel movements. Symptoms are improving. Patient pulled out NGT overnight. She reports continued bowel movements, no nausea or vomiting, and her abdomen feels less distended. Will give her a trial of clear liquids.   2. Dehydration, resolved. Received IVF in the ED.  3. Recurrent hypoglycemia, likely related to decreased PO intake. IVF changed from saline to dextrose. 4. Chronic systolic CHF. Appears compensated. Diuretics held. Can resume upon discharge. 5. CKD Stage II-III. Stable. Creatinine at baseline. 6. Seizure disorder. Stable Continue Vimpat  7. Breast cancer with bony metastases. Follow up with oncology as outpatient. 8. Severe protein calorie malnutrition, in the setting of acute illness/injury. Nutrition following.  Code Status: DNR DVT prophylaxis:Heparin  Family Communication: No family bedside. Discussed with patient who understands and has no concerns at this time. Disposition Plan: Anticipate discharge within 2-3 days.    Consultants:  PT/OT-Discharge to SNF.   Procedures:  None   Antibiotics:  None  HPI/Subjective: Denies vomiting or nausea. Abdominal pain and swelling is improving.    Objective: Filed Vitals:   01/07/15 2100 01/08/15 0615  BP: 125/53 132/57  Pulse: 109 112  Temp: 98 F (36.7 C) 98 F (36.7 C)  Resp: 18 18    Intake/Output Summary (Last 24 hours) at 01/08/15 1133 Last data filed at 01/07/15 1200  Gross per 24 hour  Intake      0 ml  Output      0 ml  Net      0 ml   Filed Weights   01/06/15 0613 01/07/15 0620 01/08/15 0615  Weight: 48.7 kg (107 lb 5.8 oz) 50 kg (110 lb 3.7 oz) 50.122 kg (110 lb 8 oz)    Exam:  General: NAD. Appears calm and looks comfortable  Cardiovascular: Regular rhythm and rate   Respiratory: clear  bilaterally, No wheezing, rales or rhonchi  Abdomen: soft, non tender, no distention , bowel sounds normal  Musculoskeletal: No edema b/l  Data Reviewed: Basic Metabolic Panel:  Recent Labs Lab 01/03/15 0639 01/04/15 0636 01/05/15 0640 01/06/15 0628 01/07/15 1031  NA 140 139 135 135 138  K 4.3 4.1 3.8 4.9 4.5  CL 109 108 105 105 104  CO2 24 25 23 22 23   GLUCOSE 81 116* 118* 102* 90  BUN 27* 23* 20 19 16   CREATININE 1.42* 1.17* 1.02* 1.04* 1.03*  CALCIUM 8.4* 8.4* 8.1* 8.7* 9.3   Liver Function Tests:  Recent Labs Lab 01/01/15 2032 01/07/15 1031  AST 18 16  ALT 9* 9*  ALKPHOS 194* 240*  BILITOT 0.9 1.0  PROT 8.4* 7.5  ALBUMIN 3.1* 2.9*   CBC:  Recent Labs Lab 01/01/15 2032 01/03/15 0639 01/07/15 1031  WBC 19.4* 10.4 10.2  NEUTROABS 17.4*  --   --   HGB 12.1 8.9* 11.2*  HCT 37.2 27.7* 34.4*  MCV 93.9 97.5 94.8  PLT 390 285 241     Recent Labs  03/04/14 0726 08/21/14 0029  BNP 1040.0* 1557.0*   CBG:  Recent Labs Lab 01/07/15 2004 01/07/15 2146 01/08/15 0036 01/08/15 0200 01/08/15 0406  GLUCAP 62* 95 52* 113* 72    Recent Results (from the past 240 hour(s))  MRSA PCR Screening     Status: None   Collection Time: 01/02/15 12:54 AM  Result Value Ref Range Status   MRSA by PCR NEGATIVE NEGATIVE Final  Comment:        The GeneXpert MRSA Assay (FDA approved for NASAL specimens only), is one component of a comprehensive MRSA colonization surveillance program. It is not intended to diagnose MRSA infection nor to guide or monitor treatment for MRSA infections.      Studies: Dg Abd Portable 1v  01/07/2015  CLINICAL DATA:  Followup of small bowel obstruction. EXAM: PORTABLE ABDOMEN - 1 VIEW COMPARISON:  01/05/2014 FINDINGS: Single supine view abdomen and pelvis. The upper abdomen is excluded. Small bowel loops measure up to 3.1 cm in the left side of the abdomen. Compare 3.3 cm on the prior. Distal gas identified. Cholecystectomy clips.  No pneumatosis or gross free intraperitoneal air. IMPRESSION: Persistent mild small bowel obstruction pattern. Electronically Signed   By: Abigail Miyamoto M.D.   On: 01/07/2015 09:29   Dg Abd Portable 1v  01/06/2015  CLINICAL DATA:  SBO.  Abdominal pain. EXAM: PORTABLE ABDOMEN - 1 VIEW COMPARISON:  01/03/2015. FINDINGS: There is persistent dilatation numerous central small bowel loops, without significant change. Paucity of large bowel gas. Surgical clips are noted in the right upper quadrant and pelvis. No evidence for free intraperitoneal air on this supine view of the abdomen. There is significant scoliosis of the lumbar spine, convex to the left. Significant sclerosis of the lumbar spine. IMPRESSION: Persistent small bowel dilatation consistent with small-bowel obstruction. Electronically Signed   By: Nolon Nations M.D.   On: 01/06/2015 11:53    Scheduled Meds: . bisacodyl  10 mg Rectal BID  . dextrose  25 mL Intravenous Once  . heparin  5,000 Units Subcutaneous 3 times per day  . lacosamide (VIMPAT) IV  50 mg Intravenous Q12H  . metoprolol  5 mg Intravenous 4 times per day  . pantoprazole (PROTONIX) IV  40 mg Intravenous Q24H  . sodium chloride  3 mL Intravenous Q12H   Continuous Infusions: . dextrose 5 % and 0.45 % NaCl with KCl 20 mEq/L      Principal Problem:   SBO (small bowel obstruction) (HCC) Active Problems:   Breast cancer metastasized to bone (HCC)   Dehydration   Right heart failure (HCC)   CKD (chronic kidney disease) stage 4, GFR 15-29 ml/min (HCC)   Essential hypertension   Leukocytosis   Systolic CHF, chronic (HCC)   Protein-calorie malnutrition, severe   Hypoglycemia    Time spent: 25 minute     Kathie Dike, MD  [facility] Pager [phone]. If 7PM-7AM, please contact night-coverage at [email or web address], password [facility] 01/08/2015, 11:33 AM  LOS: 7 days     By signing my name below, I, Rennis Harding, attest that this documentation has been  prepared under the direction and in the presence of Kathie Dike, MD. Electronically signed: Rennis Harding, Scribe. 01/08/2015  1:41pm   I, Dr. Kathie Dike, personally performed the services described in this documentaiton. All medical record entries made by the scribe were at my direction and in my presence. I have reviewed the chart and agree that the record reflects my personal performance and is accurate and complete  Kathie Dike, MD, 01/08/2015 3:02 PM

## 2015-01-08 NOTE — Progress Notes (Addendum)
Hypoglycemic Event  CBG: 62  Treatment: 1/2 tube instant glucose given  Symptoms: none   Follow-up CBG: Time:2145  CBG Result:92  Possible Reasons for Event: Inadequate meal intake  Comments/MD notified:protocol in place    Catherine Mccullough, Karene Fry

## 2015-01-08 NOTE — Progress Notes (Signed)
Pt pulled NG tube out.  Refuses replacement, wants to talk to MD at visit this AM.  190ml of fluids collected.

## 2015-01-08 NOTE — Progress Notes (Signed)
Hypoglycemic Event  CBG: 52  Treatment: 1/2 tube instant glucose  Symptoms: none  Follow-up CBG: Time:0200 CBG Result:113  Possible Reasons for Event: maybe inadequate meal intake  Comments/MD notified:FYI to Md    Rudolph Dobler, Karene Fry

## 2015-01-08 NOTE — Progress Notes (Signed)
Noted FYI - 2nd hypoglycemic event.  1st at 2100 CBG was 62.  Dextrose D50 1/2 amp was given and recheck CBG was 92..  CBG ck at 1230 was 52.  Now waiting to receive D50 to repeat same protocol.

## 2015-01-09 LAB — BASIC METABOLIC PANEL
ANION GAP: 7 (ref 5–15)
BUN: 10 mg/dL (ref 6–20)
CALCIUM: 8.1 mg/dL — AB (ref 8.9–10.3)
CHLORIDE: 107 mmol/L (ref 101–111)
CO2: 23 mmol/L (ref 22–32)
Creatinine, Ser: 0.88 mg/dL (ref 0.44–1.00)
GFR calc non Af Amer: >60 mL/min (ref >60)
Glucose, Bld: 88 mg/dL (ref 65–99)
Potassium: 4.3 mmol/L (ref 3.5–5.1)
SODIUM: 137 mmol/L (ref 135–145)

## 2015-01-09 LAB — GLUCOSE, CAPILLARY
GLUCOSE-CAPILLARY: 78 mg/dL (ref 65–99)
GLUCOSE-CAPILLARY: 84 mg/dL (ref 65–99)
GLUCOSE-CAPILLARY: 86 mg/dL (ref 65–99)
Glucose-Capillary: 81 mg/dL (ref 65–99)
Glucose-Capillary: 97 mg/dL (ref 65–99)
Glucose-Capillary: 70 mg/dL (ref 65–99)

## 2015-01-09 LAB — INSULIN ANTIBODIES, BLOOD: Insulin Antibodies, Human: <5 uU/mL

## 2015-01-09 MED ORDER — LACOSAMIDE 200 MG/20ML IV SOLN
INTRAVENOUS | Status: AC
  Filled 2015-01-09: qty 20

## 2015-01-09 NOTE — Care Management Note (Signed)
Case Management Note  Patient Details  Name: Catherine Mccullough MRN: [id number] Date of Birth: [birth year]  Subjective/Objective:                    Action/Plan:   Expected Discharge Date:                  Expected Discharge Plan:  Skilled Nursing Facility  In-House Referral:  Clinical Social Work  Discharge planning Services  CM Consult  Post Acute Care Choice:  NA Choice offered to:  NA  DME Arranged:    DME Agency:     [name]:    [place] Agency:     Status of Service:  Completed, signed off  Medicare Important Message Given:  Yes Date Medicare IM Given:    Medicare IM give by:    Date Additional Medicare IM Given:    Additional Medicare Important Message give by:     If discussed at [place] of Stay Meetings, dates discussed:  01/09/15  Additional Comments:  Joylene Draft, RN 01/09/2015, 3:06 PM

## 2015-01-09 NOTE — Progress Notes (Signed)
[facility] PROGRESS NOTE  Catherine Mccullough [id number] DOB: 10/29/1945 DOA: 01/01/2015 PCP: Default, Provider, MD Summary  31 yow prsented with complaints of abdominal pain, nausea, vomiting, CT A/P revealed SBO. Improvement was slow due to patient's hesitation to keep in NTG and difficult IV access. Treated with suppositories and bowel rest with improvement in symptoms. Will continue to advance diet as tolerated.      Assessment/Plan: 1. Persistent SBO, clinically partial with liquid bowel movements. Symptoms are improving. Last bowel movement yesterday but is sill passing flatus. Denies nausea, vomiting, or abdominal pain. Will advance diet to full liquid as tolerated.  2. Dehydration, resolved. Received with IVF.   3. Recurrent hypoglycemia, likely related to decreased PO intake. IVF changed from saline to dextrose with improvement.  4. Chronic systolic CHF. Appears compensated. Diuretics held. Can resume upon discharge. 5. CKD Stage II-III. Stable. Creatinine at baseline. 6. Seizure disorder. Stable Continue Vimpat  7. Breast cancer with bony metastases. Follow up with oncology as outpatient. 8. Severe protein calorie malnutrition, in the setting of acute illness/injury. Nutrition following.  Code Status: DNR DVT prophylaxis:Heparin  Family Communication: No family bedside. Discussed with patient who understands and has no concerns at this time.  Disposition Plan: Anticipate discharge within 2-3 days.    Consultants:  PT/OT-Discharge to SNF.   Procedures:  None   Antibiotics:  None  HPI/Subjective: Feeling well. Last bowel movement yesterday and is passing flatus. Denies nausea, vomiting, or abdominal pain and swelling.    Objective: Filed Vitals:   01/08/15 2152 01/09/15 0605  BP: 158/74 149/78  Pulse: 88 78  Temp: 98.9 F (37.2 C) 98.2 F (36.8 C)  Resp: 18 18    Intake/Output Summary (Last 24 hours) at 01/09/15 0754 Last data filed at 01/08/15  1837  Gross per 24 hour  Intake    480 ml  Output      0 ml  Net    480 ml   Filed Weights   01/07/15 0620 01/08/15 0615 01/09/15 0500  Weight: 50 kg (110 lb 3.7 oz) 50.122 kg (110 lb 8 oz) 50.2 kg (110 lb 10.7 oz)    Exam:  General: NAD. Appears calm and looks comfortable  Cardiovascular: RRR  Respiratory: clear bilaterally, No wheezing, rales or rhonchi  Abdomen: soft, non tender, no distention. Bowel sounds positive.   Musculoskeletal: No edema b/l  Data Reviewed: Basic Metabolic Panel:  Recent Labs Lab 01/04/15 0636 01/05/15 0640 01/06/15 0628 01/07/15 1031 01/09/15 0644  NA 139 135 135 138 137  K 4.1 3.8 4.9 4.5 4.3  CL 108 105 105 104 107  CO2 25 23 22 23 23   GLUCOSE 116* 118* 102* 90 88  BUN 23* 20 19 16 10   CREATININE 1.17* 1.02* 1.04* 1.03* 0.88  CALCIUM 8.4* 8.1* 8.7* 9.3 8.1*   Liver Function Tests:  Recent Labs Lab 01/07/15 1031  AST 16  ALT 9*  ALKPHOS 240*  BILITOT 1.0  PROT 7.5  ALBUMIN 2.9*   CBC:  Recent Labs Lab 01/03/15 0639 01/07/15 1031  WBC 10.4 10.2  HGB 8.9* 11.2*  HCT 27.7* 34.4*  MCV 97.5 94.8  PLT 285 241     Recent Labs  03/04/14 0726 08/21/14 0029  BNP 1040.0* 1557.0*   CBG:  Recent Labs Lab 01/08/15 0406 01/08/15 2152 01/09/15 0018 01/09/15 0604 01/09/15 0710  GLUCAP 72 87 86 78 81    Recent Results (from the past 240 hour(s))  MRSA PCR Screening  Status: None   Collection Time: 01/02/15 12:54 AM  Result Value Ref Range Status   MRSA by PCR NEGATIVE NEGATIVE Final    Comment:        The GeneXpert MRSA Assay (FDA approved for NASAL specimens only), is one component of a comprehensive MRSA colonization surveillance program. It is not intended to diagnose MRSA infection nor to guide or monitor treatment for MRSA infections.      Studies: No results found.  Scheduled Meds: . bisacodyl  10 mg Rectal BID  . dextrose  25 mL Intravenous Once  . heparin  5,000 Units Subcutaneous 3  times per day  . lacosamide (VIMPAT) IV  50 mg Intravenous Q12H  . metoprolol  5 mg Intravenous 4 times per day  . pantoprazole (PROTONIX) IV  40 mg Intravenous Q24H  . sodium chloride  3 mL Intravenous Q12H   Continuous Infusions: . dextrose 5 % and 0.45 % NaCl with KCl 20 mEq/L 75 mL/hr at 01/08/15 2337    Principal Problem:   SBO (small bowel obstruction) (HCC) Active Problems:   Breast cancer metastasized to bone [facility])   Dehydration   Right heart failure (HCC)   CKD (chronic kidney disease) stage 4, GFR 15-29 ml/min (HCC)   Essential hypertension   Leukocytosis   Systolic CHF, chronic (HCC)   Protein-calorie malnutrition, severe   Hypoglycemia    Time spent: 25 minute     Kathie Dike, MD  [facility] Pager [phone]. If 7PM-7AM, please contact night-coverage at [email or web address], password [facility] 01/09/2015, 7:54 AM  LOS: 8 days     By signing my name below, I, Rennis Harding, attest that this documentation has been prepared under the direction and in the presence of Kathie Dike, MD. Electronically signed: Rennis Harding, Scribe. 01/09/2015  11:29am   I, Dr. Kathie Dike, personally performed the services described in this documentaiton. All medical record entries made by the scribe were at my direction and in my presence. I have reviewed the chart and agree that the record reflects my personal performance and is accurate and complete  Kathie Dike, MD, 01/09/2015 11:42 AM

## 2015-01-10 DIAGNOSIS — D649 Anemia, unspecified: Secondary | ICD-10-CM | POA: Diagnosis present

## 2015-01-10 LAB — BASIC METABOLIC PANEL
Anion gap: 5 (ref 5–15)
BUN: 8 mg/dL (ref 6–20)
CHLORIDE: 108 mmol/L (ref 101–111)
CO2: 21 mmol/L — ABNORMAL LOW (ref 22–32)
Calcium: 7.6 mg/dL — ABNORMAL LOW (ref 8.9–10.3)
Creatinine, Ser: 0.83 mg/dL (ref 0.44–1.00)
GFR calc non Af Amer: >60 mL/min (ref >60)
Glucose, Bld: 90 mg/dL (ref 65–99)
POTASSIUM: 4.8 mmol/L (ref 3.5–5.1)
SODIUM: 134 mmol/L — AB (ref 135–145)

## 2015-01-10 LAB — GLUCOSE, CAPILLARY
GLUCOSE-CAPILLARY: 76 mg/dL (ref 65–99)
GLUCOSE-CAPILLARY: 82 mg/dL (ref 65–99)
GLUCOSE-CAPILLARY: 64 mg/dL — AB (ref 65–99)
Glucose-Capillary: 57 mg/dL — ABNORMAL LOW (ref 65–99)
Glucose-Capillary: 76 mg/dL (ref 65–99)
Glucose-Capillary: 78 mg/dL (ref 65–99)
Glucose-Capillary: 91 mg/dL (ref 65–99)

## 2015-01-10 LAB — CBC
HEMATOCRIT: 22.9 % — AB (ref 36.0–46.0)
Hemoglobin: 7.4 g/dL — ABNORMAL LOW (ref 12.0–15.0)
MCH: 30.6 pg (ref 26.0–34.0)
MCHC: 32.3 g/dL (ref 30.0–36.0)
MCV: 94.6 fL (ref 78.0–100.0)
Platelets: 162 10*3/uL (ref 150–400)
RBC: 2.42 MIL/uL — AB (ref 3.87–5.11)
RDW: 17.6 % — ABNORMAL HIGH (ref 11.5–15.5)
WBC: 4.4 10*3/uL (ref 4.0–10.5)

## 2015-01-10 MED ORDER — BUMETANIDE 0.25 MG/ML IJ SOLN: 1.0000 mg | Freq: Once | INTRAMUSCULAR | Status: DC

## 2015-01-10 MED ORDER — LACOSAMIDE 50 MG PO TABS
50.0000 mg | ORAL_TABLET | Freq: Two times a day (BID) | ORAL | Status: DC
  Administered 2015-01-10: 50 mg via ORAL
  Filled 2015-01-10 (×2): qty 1

## 2015-01-10 MED ORDER — SODIUM CHLORIDE 0.9 % IV SOLN: Freq: Once | INTRAVENOUS | Status: DC

## 2015-01-10 MED ORDER — PANTOPRAZOLE SODIUM 40 MG PO TBEC
40.0000 mg | DELAYED_RELEASE_TABLET | Freq: Every day | ORAL | Status: DC
  Filled 2015-01-10: qty 1

## 2015-01-10 MED ORDER — LORAZEPAM 0.5 MG PO TABS: 0.5000 mg | ORAL_TABLET | Freq: Four times a day (QID) | ORAL | Status: DC | PRN

## 2015-01-10 NOTE — Progress Notes (Signed)
Spoke with Dr.Fisher and she's aware of patient's request to not be stuck today and would rather have her blood drawn by lab in the morning for her type and screen for blood transfusion.

## 2015-01-10 NOTE — Progress Notes (Addendum)
[facility] PROGRESS NOTE  Catherine Mccullough [id number] DOB: 06-25-1945 DOA: 01/01/2015 PCP: Default, Provider, MD Summary  70 -year-old woman who presented with complaints of abdominal pain, nausea, vomiting, CT A/P revealed SBO. Improvement was slow due to patient's hesitation to keep in NTG and difficult IV access. Treated with suppositories and bowel rest with improvement in symptoms.    Assessment/Plan: Persistent SBO, clinically partial with liquid bowel movements. Symptoms are improving. Last bowel movement was day before yesterday but is sill passing flatus. Denies nausea, vomiting, or abdominal pain. -Diet was advanced to full liquids on 01/09/15. She appears to be tolerating it. -We'll advance her diet to dysphagia 2.  Chronic anemia, thought to be secondary to acute disease and chronic blood loss. During the previous hospitalization in December 2016, she was transfused 2 units of packed red blood cells for an upper GI bleed secondary to erosive gastritis and Mallory-Weiss tear. -On admission, her hemoglobin was 12.1, likely from hemoconcentration. It has fallen gradually to 7.4. In part, the decrease in her hemoglobin is secondary to the hemo-dilutional effects of IV fluids. She is also on heparin. She continues on IV Protonix. -We'll discontinue subcutaneous heparin. -We'll transfuse her 1 unit packed red blood cells as she does complain of fatigue. (The patient refuses venipuncture for type and cross, but agrees to it tomorrow morning). -We'll guaiac her stools.   Dehydration. Patient was started on IV fluids for hydration. Her dehydration has resolved.  -Her IV fluids have been decreased to 50 cc an hour.    Recurrent hypoglycemia. likely related to decreased PO intake. This is long-standing and was noted during her previous hospitalization in December 2016. IV fluids were changed to add dextrose. There has been improvement in her CBGs.   Chronic systolic CHF. Her  heart failure appears to be in compensated. Bumex is currently on hold. Continue to monitor.  CKD Stage II-III. Stable. Creatinine at baseline.  Seizure disorder. Stable Continue Vimpat   Breast cancer with bony metastases. Follow up with oncology as outpatient.  Severe protein calorie malnutrition, in the setting of acute illness/injury. Nutrition following.  Code Status: DNR DVT prophylaxis:Heparin  Family Communication: No family bedside. Discussed with patient who understands and has no concerns at this time.  Disposition Plan: Anticipate discharge within 2-3 days.    Consultants:  PT/OT   Procedures:  None   Antibiotics:  None  HPI/Subjective: Patient says that she is tolerating a full liquid soups. She feels that she can eat semisolid foods. She denies abdominal pain or chest congestion. She says that she feels more tired.   Objective: Filed Vitals:   01/10/15 0450 01/10/15 1356  BP: 125/34 122/34  Pulse: 84 71  Temp: 98.2 F (36.8 C) 98.1 F (36.7 C)  Resp: 20 20   oxygen saturation 100% on room air.  Intake/Output Summary (Last 24 hours) at 01/10/15 1856 Last data filed at 01/10/15 1700  Gross per 24 hour  Intake 1508.75 ml  Output   1100 ml  Net 408.75 ml   Filed Weights   01/08/15 0615 01/09/15 0500 01/10/15 0450  Weight: 50.122 kg (110 lb 8 oz) 50.2 kg (110 lb 10.7 oz) 50.1 kg (110 lb 7.2 oz)    Exam:  General:70 year old debilitating appearing woman in no acute distress.  Cardiovascular: S1, S2, with a soft systolic murmur.  Respiratory: Clear anteriorly with decreased breath sounds in the bases.  Abdomen: Positive bowel sounds, soft, nontender, nondistended.   Musculoskeletal/extremities: No pedal edema.  Data  Reviewed: Basic Metabolic Panel:  Recent Labs Lab 01/05/15 0640 01/06/15 0628 01/07/15 1031 01/09/15 0644 01/10/15 0720  NA 135 135 138 137 134*  K 3.8 4.9 4.5 4.3 4.8  CL 105 105 104 107 108  CO2 23 22 23 23  21*   GLUCOSE 118* 102* 90 88 90  BUN 20 19 16 10 8   CREATININE 1.02* 1.04* 1.03* 0.88 0.83  CALCIUM 8.1* 8.7* 9.3 8.1* 7.6*   Liver Function Tests:  Recent Labs Lab 01/07/15 1031  AST 16  ALT 9*  ALKPHOS 240*  BILITOT 1.0  PROT 7.5  ALBUMIN 2.9*   CBC:  Recent Labs Lab 01/07/15 1031 01/10/15 0720  WBC 10.2 4.4  HGB 11.2* 7.4*  HCT 34.4* 22.9*  MCV 94.8 94.6  PLT 241 162     Recent Labs  03/04/14 0726 08/21/14 0029  BNP 1040.0* 1557.0*   CBG:  Recent Labs Lab 01/10/15 0450 01/10/15 0719 01/10/15 1127 01/10/15 1638 01/10/15 1757  GLUCAP 76 76 82 57* 64*    Recent Results (from the past 240 hour(s))  MRSA PCR Screening     Status: None   Collection Time: 01/02/15 12:54 AM  Result Value Ref Range Status   MRSA by PCR NEGATIVE NEGATIVE Final    Comment:        The GeneXpert MRSA Assay (FDA approved for NASAL specimens only), is one component of a comprehensive MRSA colonization surveillance program. It is not intended to diagnose MRSA infection nor to guide or monitor treatment for MRSA infections.      Studies: No results found.  Scheduled Meds: . sodium chloride   Intravenous Once  . sodium chloride   Intravenous Once  . bisacodyl  10 mg Rectal BID  . bumetanide  1 mg Intravenous Once  . dextrose  25 mL Intravenous Once  . heparin  5,000 Units Subcutaneous 3 times per day  . lacosamide (VIMPAT) IV  50 mg Intravenous Q12H  . metoprolol  5 mg Intravenous 4 times per day  . pantoprazole (PROTONIX) IV  40 mg Intravenous Q24H  . sodium chloride  3 mL Intravenous Q12H   Continuous Infusions: . dextrose 5 % and 0.45 % NaCl with KCl 20 mEq/L 50 mL/hr at 01/10/15 1354    Principal Problem:   SBO (small bowel obstruction) (HCC) Active Problems:   Breast cancer metastasized to bone [facility])   Dehydration   Right heart failure (HCC)   CKD (chronic kidney disease) stage 4, GFR 15-29 ml/min (HCC)   Essential hypertension   Leukocytosis    Systolic CHF, chronic (HCC)   Protein-calorie malnutrition, severe   Hypoglycemia    Time spent: 30 minutes     Rexene Alberts, MD  [facility] Pager [phone]. If 7PM-7AM, please contact night-coverage at [email or web address], password [facility] 01/10/2015, 6:56 PM  LOS: 9 days

## 2015-01-10 NOTE — Progress Notes (Signed)
Hypoglycemic Event  CBG: 57  Treatment: 15 GM carbohydrate snack  Symptoms: None  Follow-up CBG: Time: 1757 CBG Result: 64  Possible Reasons for Event: Inadequate meal intake  Comments/MD notified: Patient stated "My blood sugar drops all the time like this. It's happened for years."    Carolyna Yerian L

## 2015-01-10 NOTE — Progress Notes (Addendum)
Patient's IV infiltrated, she refuses to have the IV restarted.  She was informed the doctor ordered SCD's and discontinued the heparin, refuses to have SCD's placed on.

## 2015-01-10 NOTE — Progress Notes (Signed)
[place] to patient's room by lab tech d/t patient refusing to be stuck again in order to obtain blood sample for type and screen for ordered blood transfusions. Patient very upset and refusing to be stuck at this time and requesting to speak with the doctor. MD notified.

## 2015-01-11 LAB — CBC
HCT: 25.6 % — ABNORMAL LOW (ref 36.0–46.0)
Hemoglobin: 8.4 g/dL — ABNORMAL LOW (ref 12.0–15.0)
MCH: 31.2 pg (ref 26.0–34.0)
MCHC: 32.8 g/dL (ref 30.0–36.0)
MCV: 95.2 fL (ref 78.0–100.0)
PLATELETS: 142 10*3/uL — AB (ref 150–400)
RBC: 2.69 MIL/uL — AB (ref 3.87–5.11)
RDW: 17.6 % — AB (ref 11.5–15.5)
WBC: 3.6 10*3/uL — ABNORMAL LOW (ref 4.0–10.5)

## 2015-01-11 LAB — BASIC METABOLIC PANEL
Anion gap: 6 (ref 5–15)
BUN: 8 mg/dL (ref 6–20)
CALCIUM: 7.8 mg/dL — AB (ref 8.9–10.3)
CO2: 18 mmol/L — ABNORMAL LOW (ref 22–32)
CREATININE: 0.88 mg/dL (ref 0.44–1.00)
Chloride: 109 mmol/L (ref 101–111)
GFR calc Af Amer: >60 mL/min (ref >60)
Glucose, Bld: 70 mg/dL (ref 65–99)
POTASSIUM: 4.7 mmol/L (ref 3.5–5.1)
SODIUM: 133 mmol/L — AB (ref 135–145)

## 2015-01-11 LAB — GLUCOSE, CAPILLARY
GLUCOSE-CAPILLARY: 62 mg/dL — AB (ref 65–99)
GLUCOSE-CAPILLARY: 64 mg/dL — AB (ref 65–99)
GLUCOSE-CAPILLARY: 72 mg/dL (ref 65–99)
GLUCOSE-CAPILLARY: 65 mg/dL (ref 65–99)
Glucose-Capillary: 50 mg/dL — ABNORMAL LOW (ref 65–99)

## 2015-01-11 LAB — PREPARE RBC (CROSSMATCH)

## 2015-01-11 MED ORDER — OXYCODONE-ACETAMINOPHEN 5-325 MG PO TABS: 1.0000 | ORAL_TABLET | ORAL | Status: AC | PRN

## 2015-01-11 MED ORDER — METOPROLOL SUCCINATE ER 25 MG PO TB24: 25.0000 mg | ORAL_TABLET | Freq: Every day | ORAL | Status: AC

## 2015-01-11 MED ORDER — SENNOSIDES-DOCUSATE SODIUM 8.6-50 MG PO TABS: 1.0000 | ORAL_TABLET | Freq: Every day | ORAL | Status: AC

## 2015-01-11 MED ORDER — PREGABALIN 25 MG PO CAPS: 25.0000 mg | ORAL_CAPSULE | Freq: Three times a day (TID) | ORAL | Status: AC

## 2015-01-11 MED ORDER — OXYCODONE-ACETAMINOPHEN 5-325 MG PO TABS
1.0000 | ORAL_TABLET | Freq: Once | ORAL | Status: AC
  Administered 2015-01-11: 1 via ORAL
  Filled 2015-01-11: qty 1

## 2015-01-11 MED ORDER — SODIUM BICARBONATE 650 MG PO TABS: 650.0000 mg | ORAL_TABLET | Freq: Two times a day (BID) | ORAL | Status: DC

## 2015-01-11 MED ORDER — LOSARTAN POTASSIUM 50 MG PO TABS: 25.0000 mg | ORAL_TABLET | Freq: Every day | ORAL | Status: DC

## 2015-01-11 MED ORDER — BUMETANIDE 0.5 MG PO TABS: 0.5000 mg | ORAL_TABLET | Freq: Two times a day (BID) | ORAL | Status: AC

## 2015-01-11 MED ORDER — ENSURE COMPLETE PO LIQD: 237.0000 mL | Freq: Three times a day (TID) | ORAL | Status: AC

## 2015-01-11 NOTE — Progress Notes (Signed)
[id number] MD notified regarding pt's request for PO pain medication and PO metoprolol.

## 2015-01-11 NOTE — Progress Notes (Signed)
Hypoglycemic Event  CBG: 50  Treatment: 15 GM carbohydrate snack  Symptoms: None  Follow-up CBG: Time: 0845 CBG Result: 68  Possible Reasons for Event: Inadequate meal intake  Comments/MD notified: yes    Eugenie Harewood L

## 2015-01-11 NOTE — Progress Notes (Signed)
1745 Patient transported via stretcher/ambulance to [place]. Marcie Bal ([facility] Social Worker) called and verified transfer and paperwork sent via fax. DNR and hard Rx sent with patient and given to EMS staff. IV catheter removed from RIGHT forearm, intact, area noted slightly swollen d/t infiltration, patient c/o tenderness during IV removal but tolerated it well.

## 2015-01-11 NOTE — Progress Notes (Signed)
Patient for d/c today to SNF bed at [place].  Son [name]) and patient agreeable to this plan- plan transfer via EMS. Eduard Clos, MSW, Latanya Presser

## 2015-01-11 NOTE — Care Management Note (Signed)
Case Management Note  Patient Details  Name: Catherine Mccullough MRN: [id number] Date of Birth: [birth year]  Subjective/Objective:                    Action/Plan:   Expected Discharge Date:                  Expected Discharge Plan:  Skilled Nursing Facility  In-House Referral:  Clinical Social Work  Discharge planning Services  CM Consult  Post Acute Care Choice:  NA Choice offered to:  NA  DME Arranged:    DME Agency:     [name]:    [place] Agency:     Status of Service:  Completed, signed off  Medicare Important Message Given:  Yes Date Medicare IM Given:    Medicare IM give by:    Date Additional Medicare IM Given:    Additional Medicare Important Message give by:     If discussed at [place] of Stay Meetings, dates discussed:    Additional Comments: Pt to discharge today. CSW to arrange discharge to facility. Christinia Gully [place], RN 01/11/2015, 1:20 PM

## 2015-01-11 NOTE — Progress Notes (Signed)
82 Spoke with patient regarding her IV catheter site to RIGHT forearm. Report received from night shift nurse this morning that IV catheter site appeared to be infiltrated so MD orders obtained to stop IVF and change IV medications to PO. Patient refused to allow the nurse to remove IV catheter from RIGHT forearm and it remains in place at this time per patient request but is not being utilized. Patient stated "If I can't have my medicine and blood through this IV then I'm not getting it because I refuse to be stuck again. This IV site is just fine and the swelling has come down a lot." MD notified and orders given to d/c blood transfusion orders at this time. Hgb 8.4 this AM lab, MD aware.

## 2015-01-11 NOTE — Progress Notes (Signed)
0906 Patient refusing PO meds and requesting to speak with the doctor, MD notified.

## 2015-01-11 NOTE — Progress Notes (Signed)
Hypoglycemic Event  CBG: 65  Treatment: 15 GM carbohydrate snack  Symptoms: None  Follow-up CBG: Time:  CBG Result: 74  Possible Reasons for Event: Inadequate meal intake  Comments/MD notified:     Mya Suell L

## 2015-01-11 NOTE — Care Management Important Message (Signed)
Important Message  Patient Details  Name: Catherine Mccullough MRN: [id number] Date of Birth: [birth year]   Medicare Important Message Given:  Yes    Joylene Draft, RN 01/11/2015, 1:09 PM

## 2015-01-11 NOTE — Discharge Summary (Addendum)
Physician Discharge Summary  Catherine Mccullough [id number] DOB: 05-31-1945 DOA: 01/01/2015  PCP: Default, Provider, MD  Admit date: 01/01/2015 Discharge date: 01/11/2015  Time spent: Greater than 30 minutes  Recommendations for Outpatient Follow-up:  1. Recommend that the patient be scheduled for appointments with her cardiologist, Dr. Carlyle Dolly in 1-2 weeks. She will also need to have a follow-up appointment with her oncologist at [facility] in 2-3 weeks.  2. A number of the patient's medications doses have been decreased secondary to low-normal blood pressures and poor overall oral intake. 3. Recommend daily CBG checks due to the patient's chronic hypoglycemia.   Discharge Diagnoses:  1. Small bowel obstruction, resolved clinically. 2. Anemia of chronic disease. 3. Recent history of blood loss anemia in December 2016 requiring 2 units of packed red blood cells for an upper GI bleed secondary to erosive gastritis and Mallory-Weiss tear. 4. Dehydration. 5. Chronic systolic heart failure with an EF of 25% per echo 11/2014. 6. 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 7. Essential hypertension with low-normal blood pressures during hospitalization. 8. Chronic metabolic acidosis, on oral bicarbonate replacement therapy. 9. History of breast cancer with metastasis to the bone. Patient is followed by oncology at [facility]. 10. Seizure disorder. 11. Chronic right rotator cuff tear/arthropathy. 12. 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 13. Chronic debilitation.  14. Severe protein calorie malnutrition in the setting of chronic debilitation and acute on chronic illnesses. 15. DO NOT RESUSCITATE status.   Discharge Condition: Improved.  Diet recommendation:Dysphagia 2 Heart healthy with [place] 3 times a day 2 prevent hypoglycemia.  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 She presented to the ED on 01/01/2015 with a chief complaint of abdominal pain, nausea, and vomiting. In the ED, she was afebrile and hemodynamically stable. Her lab data were significant for WBC of 19.4, CO2 of 21, BUN of 22, creatinine 1.11 and relatively normal LFTs. CT of her abdomen and pelvis reveals small bowel obstruction with transition point at the distal ileum. She was admitted for further evaluation and management.  Hospital Course:  1. Small bowel obstruction. Of note, patient has a history of diarrhea during the previous hospitalization in November to December 2016. CT scan of her abdomen on this admission revealed small bowel obstruction. She was started on as needed antiemetics, as needed analgesics for pain, and NG suction for drainage. She was made to be nothing by mouth. IV Protonix was added. Eventually, a number of enemas were given. The patient began to have semisolid and loose bowel movements. Follow-up abdominal x-ray revealed resolution of the small bowel obstruction. Her pain, nausea, and vomiting resolved. She was started on a full liquid diet which she tolerated well. It was advanced to a dysphagia 2 diet which she tolerated. Her small bowel obstruction resolved with medical management only. General surgery was not consulted. She was discharged on Senokot-S daily at bedtime.  Dehydration. Patient was noted to have mild dehydration on admission with a BUN of 27 and a creatinine  of 1.30. She does have a history of chronic kidney disease, however,. Clinically,  she appeared dehydrated and therefore Bumex was held. She was started on IV fluids for hydration. Her BUN and creatinine normalized. Bumex was restarted upon discharge.  Chronic systolic congestive heart failure. On exam and clinically, the patient appeared to have compensated heart failure. There is no evidence of pulmonary edema. Bumex was held during hospitalization due to low-normal blood pressures and dehydration. Toprol and Cozaar were with held as well.  At the time of discharge, Bumex was restarted at 0.5 mg twice a day rather than 1 mg twice a day. If she shows  any signs of heart failure decompensation in the outpatient setting, would recommend  increasing the dose  dose back to 1 mg twice a day. also, due to her low-normal blood pressures, Toprol-XL was decreased from 100 mg to 25 mg daily. Cozaar was restarted upon discharge at 25 mg daily.  Would recommend that she follow-up with her cardiologist in 1-2 weeks.   Anemia of chronic disease and recent history of acute blood loss anemia. Patient's hemoglobin was 12.1 on admission which was likely from hemoconcentration. It fell gradually to 7.4, in part due to hemodilution from IV fluids. She was on heparin for DVT prophylaxis, but it was discontinued. She was restarted on her PPI. Her hemoglobin improved to 8.4 prior to discharge with no transfusion. -Patient has a history of Mallory-Weiss tear and erosive gastritis. She also has a history of metastatic breast cancer. During this hospitalization, her anemia was likely secondary to hemodilution from IV fluids and from chronic disease rather than an acute GI bleed. Would recommend further monitoring.  Recurrent hypoglycemia. Patient has a remote history of type 2 diabetes mellitus. She also was noted to have recurrent hypoglycemia during the previous hospitalization November-December 2016, therefore insulin was discontinued. This was thought to be secondary to her marginal oral intake. She had  asymptomatic hypoglycemia during this hospitalization which was treated with dextrose as needed. When her intake improved, her CBGs improved. Would recommend checking her CBG at least once daily and treating hypoglycemia accordingly. Would offer the patient Ensure times a day consistently. -Of note, her hemoglobin A1c was 5.5 on 01/04/15.  Chronic kidney disease. Patient has a history of chronic kidney disease, likely stage II to 3. However during hospitalization, following IV fluid hydration, her BUN and creatinine completely normalized.  Chronic metabolic acidosis. The patient has chronic metabolic acidosis of unknown etiology. She was restarted on oral bicarbonate.  Seizure disorder. This remained stable. She was continued on Vimpat.  History of breast cancer with metastasis. The patient reports being followed by oncology in [facility] at the [facility] cancer clinic. She denies current treatment with chemotherapy etc. -She does have chronic pain. She was restarted on oxycodone as needed. -At the time of discharge, a lower dose of Percocet was ordered as well as a lower dose of Lyrica due to her low-normal blood pressures and small bowel obstruction. -Would recommend that she follow-up with her oncologist in 2-3 weeks.   Procedures:  None  Consultations:  None  Discharge Exam: Filed Vitals:   01/10/15 2233 01/11/15 0420  BP: 124/40 125/31  Pulse: 77 77  Temp: 98.1 F (36.7 C) 98.2 F (36.8 C)  Resp: 20 18  Oxygen saturation 100% on room air.  General:70 year old debilitating appearing woman in no acute distress. Quite talkative and quite alert.  Cardiovascular: S1, S2, with a soft systolic murmur.  Respiratory: Clear  anteriorly with decreased breath sounds in the bases.  Abdomen: Positive bowel sounds, soft, nontender, nondistended.   Musculoskeletal/extremities: No pedal edema. Chronic right rotator cuff tear with limited mobility in the right  upper extremity.  Discharge Instructions   Discharge Instructions    Diet - low sodium heart healthy    Complete by:  As directed      Increase activity slowly    Complete by:  As directed           Current Discharge Medication List    START taking these medications   Details  feeding supplement, ENSURE COMPLETE, (ENSURE COMPLETE) LIQD Take 237 mLs by mouth 3 (three) times daily between meals.    oxyCODONE-acetaminophen (ROXICET) 5-325 MG tablet Take 1 tablet by mouth every 4 (four) hours as needed for severe pain. Qty: 30 tablet, Refills: 0    senna-docusate (SENOKOT S) 8.6-50 MG tablet Take 1 tablet by mouth daily.      CONTINUE these medications which have CHANGED   Details  bumetanide (BUMEX) 0.5 MG tablet Take 1 tablet (0.5 mg total) by mouth 2 (two) times daily.    metoprolol succinate (TOPROL-XL) 25 MG 24 hr tablet Take 1 tablet (25 mg total) by mouth daily. Take with or immediately following a meal.    pregabalin (LYRICA) 25 MG capsule Take 1 capsule (25 mg total) by mouth 3 (three) times daily.      CONTINUE these medications which have NOT CHANGED   Details  acetaminophen (TYLENOL) 500 MG tablet Take 1,000 mg by mouth 3 (three) times daily.    aspirin EC 81 MG tablet Take 81 mg by mouth daily.    cetirizine (ZYRTEC) 10 MG tablet Take 5 mg by mouth daily as needed for allergies.    Cholecalciferol (VITAMIN D3) 5000 UNITS CAPS Take 1 capsule by mouth daily.    citalopram (CELEXA) 10 MG tablet Take 10 mg by mouth daily.    gentamicin (GARAMYCIN) 0.3 % ophthalmic solution Place 1 drop into the left eye 3 (three) times daily.    lacosamide (VIMPAT) 50 MG TABS tablet Take 1 tablet (50 mg total) by mouth 2 (two) times daily. Qty: 60 tablet, Refills: 3    losartan (COZAAR) 25 MG tablet Take 1 tablet (25 mg total) by mouth daily. Medication for your heart Qty: 30 tablet, Refills: 3    omeprazole (PRILOSEC) 40 MG capsule Take 40 mg by mouth daily.     promethazine (PHENERGAN) 25 MG tablet Take 25 mg by mouth every 6 (six) hours as needed for nausea or vomiting.    sodium bicarbonate 650 MG tablet Take 1 tablet (650 mg total) by mouth 2 (two) times daily. Qty: 60 tablet, Refills: 3    vitamin C (ASCORBIC ACID) 500 MG tablet Take 500 mg by mouth daily.      STOP taking these medications     oxyCODONE-acetaminophen (PERCOCET) 10-325 MG tablet      diclofenac sodium (VOLTAREN) 1 % GEL        Allergies  Allergen Reactions  . Mometasone Shortness Of Breath  . Vancomycin Itching  . Aspirin Other (See Comments)    "Inflames stomach"  . Augmentin [Amoxicillin-Pot Clavulanate]     Unknown reaction  . Contrast Media [Iodinated Diagnostic Agents] Other (See Comments)    Made Heart Stop.   . Cortisone Other (See Comments)    Hold fluid  . Doxycycline Nausea And Vomiting  . Ibuprofen Nausea And Vomiting  . Insulins Other (See Comments)  Pt says even the tiniest bit of insulin makes her go unconscious because her BS gets too low  . Iodine     Unknown reaction  . Lasix [Furosemide] Other (See Comments)    Paradoxical Response  . Other     Iv bp med unknown.and adhesive tape-silicones  . Prednisone     Sweating   . Sulfamethoxazole Other (See Comments)    Bottomed out platelets  . Tape   . Ultram [Tramadol Hcl] Other (See Comments)    "Grand mal seizure"  . Codeine Rash  . Dilantin [Phenytoin Sodium] Rash  . Latex Rash  . Piperacillin Sod-Tazobactam So Rash   Follow-up Information    Please follow up.   Why:  MD at [facility]       The results of significant diagnostics from this hospitalization (including imaging, microbiology, ancillary and laboratory) are listed below for reference.    Significant Diagnostic Studies: Dg Abd Portable 1v  01/07/2015  CLINICAL DATA:  Followup of small bowel obstruction. EXAM: PORTABLE ABDOMEN - 1 VIEW COMPARISON:  01/05/2014 FINDINGS: Single supine view abdomen and pelvis. The upper  abdomen is excluded. Small bowel loops measure up to 3.1 cm in the left side of the abdomen. Compare 3.3 cm on the prior. Distal gas identified. Cholecystectomy clips. No pneumatosis or gross free intraperitoneal air. IMPRESSION: Persistent mild small bowel obstruction pattern. Electronically Signed   By: Abigail Miyamoto M.D.   On: 01/07/2015 09:29   Dg Abd Portable 1v  01/06/2015  CLINICAL DATA:  SBO.  Abdominal pain. EXAM: PORTABLE ABDOMEN - 1 VIEW COMPARISON:  01/03/2015. FINDINGS: There is persistent dilatation numerous central small bowel loops, without significant change. Paucity of large bowel gas. Surgical clips are noted in the right upper quadrant and pelvis. No evidence for free intraperitoneal air on this supine view of the abdomen. There is significant scoliosis of the lumbar spine, convex to the left. Significant sclerosis of the lumbar spine. IMPRESSION: Persistent small bowel dilatation consistent with small-bowel obstruction. Electronically Signed   By: Nolon Nations M.D.   On: 01/06/2015 11:53   Dg Abd Portable 1v  01/03/2015  CLINICAL DATA:  Small bowel obstruction EXAM: PORTABLE ABDOMEN - 1 VIEW COMPARISON:  CT abdomen and pelvis January 01, 2015 FINDINGS: There is currently no appreciable bowel dilatation or air-fluid level. No free air evident on this supine examination. There is lumbar scoliosis with extensive lumbar arthropathy. There are surgical clips in the pelvis and right upper quadrant regions. There are phleboliths in the pelvis. IMPRESSION: There is no longer appreciable bowel dilatation. The appearance is consistent with resolving bowel obstruction. No free air evident. Advanced arthropathy in the lumbar spine with scoliosis remains. Electronically Signed   By: Lowella Grip III M.D.   On: 01/03/2015 08:50   Ct Renal Stone Study  01/01/2015  CLINICAL DATA:  Generalized abdominal pain and vomiting for 1 day. EXAM: CT ABDOMEN AND PELVIS WITHOUT CONTRAST TECHNIQUE:  Multidetector CT imaging of the abdomen and pelvis was performed following the standard protocol without IV contrast. COMPARISON:  12/11/2014 FINDINGS: Lower chest: Small right pleural effusion. Atherosclerotic disease of the coronary arteries. Hepatobiliary: No mass visualized on this un-enhanced exam. Status post cholecystectomy. Pancreas: The pancreas is atrophic. Coarse calcifications are seen within the head of the pancreas or within the peripancreatic fat. Spleen: Within normal limits in size. Adrenals/Urinary Tract: No evidence of urolithiasis or hydronephrosis. No definite mass visualized on this un-enhanced exam. Right kidney is atrophic. Stomach/Bowel: There is a  diffuse fluid distention of small bowel loops to the level of the terminal ileum with maximum diameter of 37 mm. The transitional point appears to be at the level of the distal ileum in the right lower quadrant, and with there are inflammatory mesenteric changes. The cecum is distended. The transverse and left colon and decompressed. Un apparent abrupt narrowing is seen at the level of the proximal transverse colon comminuted transitional point of small-bowel obstruction. Vascular/Lymphatic: No pathologically enlarged lymph nodes. No evidence of abdominal aortic aneurysm. Atherosclerotic disease of the aorta is seen. Reproductive: No mass or other significant abnormality. Other: Moderate amount of abdominal ascites.  Anasarca. Musculoskeletal: S shaped scoliosis with secondary osteoarthritic changes. Mottled appear over a rounds of L2 vertebral body may be metastatic. IMPRESSION: Small bowel obstruction with transitional point at the distal ileum. In cross proximity, there is also an apparent narrowing of the proximal transverse colon causing enlargement of the cecum. Ascites. Coarse calcifications within an atrophic appearing head of the pancreas. This may represent a chronic pancreatitis, or low grade pancreatic malignancy. This could be further  evaluated with MRI when found clinically appropriate. Mottled appearance of L2 vertebral body, which may represent a metastatic lesion. Anasarca. Small right pleural effusion. Electronically Signed   By: Fidela Salisbury M.D.   On: 01/01/2015 21:31    Microbiology: Recent Results (from the past 240 hour(s))  MRSA PCR Screening     Status: None   Collection Time: 01/02/15 12:54 AM  Result Value Ref Range Status   MRSA by PCR NEGATIVE NEGATIVE Final    Comment:        The GeneXpert MRSA Assay (FDA approved for NASAL specimens only), is one component of a comprehensive MRSA colonization surveillance program. It is not intended to diagnose MRSA infection nor to guide or monitor treatment for MRSA infections.      Labs: Basic Metabolic Panel:  Recent Labs Lab 01/06/15 0628 01/07/15 1031 01/09/15 0644 01/10/15 0720 01/11/15 0740  NA 135 138 137 134* 133*  K 4.9 4.5 4.3 4.8 4.7  CL 105 104 107 108 109  CO2 22 23 23  21* 18*  GLUCOSE 102* 90 88 90 70  BUN 19 16 10 8 8   CREATININE 1.04* 1.03* 0.88 0.83 0.88  CALCIUM 8.7* 9.3 8.1* 7.6* 7.8*   Liver Function Tests:  Recent Labs Lab 01/07/15 1031  AST 16  ALT 9*  ALKPHOS 240*  BILITOT 1.0  PROT 7.5  ALBUMIN 2.9*   No results for input(s): LIPASE, AMYLASE in the last 168 hours. No results for input(s): AMMONIA in the last 168 hours. CBC:  Recent Labs Lab 01/07/15 1031 01/10/15 0720 01/11/15 0740  WBC 10.2 4.4 3.6*  HGB 11.2* 7.4* 8.4*  HCT 34.4* 22.9* 25.6*  MCV 94.8 94.6 95.2  PLT 241 162 142*   Cardiac Enzymes: No results for input(s): CKTOTAL, CKMB, CKMBINDEX, TROPONINI in the last 168 hours. BNP: BNP (last 3 results)  Recent Labs  03/04/14 0726 08/21/14 0029  BNP 1040.0* 1557.0*    ProBNP (last 3 results) No results for input(s): PROBNP in the last 8760 hours.  CBG:  Recent Labs Lab 01/10/15 1959 01/11/15 0124 01/11/15 0423 01/11/15 0759 01/11/15 1114  GLUCAP 78 62* 64* 50* 72        Signed:  Rexene Alberts MD  FACP  [facility] 01/11/2015, 2:59 PM

## 2015-01-13 LAB — TYPE AND SCREEN
ABO/RH(D): A POS
Antibody Screen: NEGATIVE
Unit division: 0
Unit division: 0

## 2015-04-02 ENCOUNTER — Ambulatory Visit: Payer: Medicare Other | Admitting: Cardiology

## 2015-04-02 NOTE — Progress Notes (Unsigned)
Patient ID: ADRENA NAKAMURA, female   DOB: 1945/07/29, 70 y.o.   MRN: [id number]     Clinical Summary Ms. Dack is a 70 y.o.female  1. Pulmonic insufficiency - has been followed at [facility] for reported severe pulmonic regurgitation - abdominal distension can occur after eating. SOB. + LE edema.  - DOE has worsened, cannot walk all the way down her apartment hallway without stopping. Can be SOB with walking room to room at her apartment. - home weights 120-125 lbs and stable  2. HTN - checks 1-2 times per week -compliant with meds   3. Anemia - Dec 2016 admit with anemia requiring transfusion, followed by GI.   4. Metatstatic breast cancer - followed by [facility] oncology  5. Chronic systolic heart failure - echo 11/2014 LVEF 25%, multiple WMAs. Mild RV dysfunction. At least moderate PI - acute drop in LVEF compared to echo 08/2014. From notes no further invasive testing was pursued due to advanced multiple comorbidities.   6. Afib - admit 11/2014 with afib with RVR in setting of respiratory failure and intubation, possible sepsis.  - no anticoag given recent GI bleed 12/2014.   Past Medical History  Diagnosis Date  . Kidney disease   . Hypertension   . Diabetes mellitus   . 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 Arthritis   . CHF (congestive heart failure) ([place])   . Lymphedema of leg   . Anxiety   . Pulmonary hypertension ([place])   . 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 Pancreatitis   . Depression   . Anemia   . Shingles   . PUD (peptic ulcer disease)   . GERD (gastroesophageal reflux disease)   . MRSA (methicillin resistant staph aureus) culture positive   . Fibromyalgia   . Acute respiratory failure with hypoxia ([place]) 11/2014    Intubated and extubated, presumed to be secondary to status epilepticus.  . Status epilepticus ([place]) 11/2014  . Acute blood loss anemia 11/2014  . Mallory-Weiss tear 11/2014  . Acute systolic  congestive heart failure ([place]) 12/13/2014  . Metabolic acidosis 62/07/348  . Diarrhea 12/13/2014  . Rotator cuff tear arthropathy of right shoulder 05/29/2011     Allergies  Allergen Reactions  . Mometasone Shortness Of Breath  . Vancomycin Itching  . Aspirin Other (See Comments)    "Inflames stomach"  . Augmentin [Amoxicillin-Pot Clavulanate]     Unknown reaction  . Contrast Media [Iodinated Diagnostic Agents] Other (See Comments)    Made Heart Stop.   . Cortisone Other (See Comments)    Hold fluid  . Doxycycline Nausea And Vomiting  . Ibuprofen Nausea And Vomiting  . Insulins Other (See Comments)    Pt says even the tiniest bit of insulin makes her go unconscious because her BS gets too low  . Iodine     Unknown reaction  . Lasix [Furosemide] Other (See Comments)    Paradoxical Response  . Other     Iv bp med unknown.and adhesive tape-silicones  . Prednisone     Sweating   . Sulfamethoxazole Other (See Comments)    Bottomed out platelets  . Tape   . Ultram [Tramadol Hcl] Other (See Comments)    "Grand mal seizure"  . Codeine Rash  . Dilantin [Phenytoin Sodium] Rash  . Latex Rash  . Piperacillin Sod-Tazobactam So Rash     Current Outpatient Prescriptions  Medication Sig Dispense Refill  .  acetaminophen (TYLENOL) 500 MG tablet Take 1,000 mg by mouth 3 (three) times daily.    Marland Kitchen aspirin EC 81 MG tablet Take 81 mg by mouth daily.    . bumetanide (BUMEX) 0.5 MG tablet Take 1 tablet (0.5 mg total) by mouth 2 (two) times daily.    . cetirizine (ZYRTEC) 10 MG tablet Take 5 mg by mouth daily as needed for allergies.    . Cholecalciferol (VITAMIN D3) 5000 UNITS CAPS Take 1 capsule by mouth daily.    . citalopram (CELEXA) 10 MG tablet Take 10 mg by mouth daily.    . feeding supplement, ENSURE COMPLETE, (ENSURE COMPLETE) LIQD Take 237 mLs by mouth 3 (three) times daily between meals.    Marland Kitchen gentamicin (GARAMYCIN) 0.3 % ophthalmic solution Place 1 drop into the left eye 3 (three)  times daily.    Marland Kitchen lacosamide (VIMPAT) 50 MG TABS tablet Take 1 tablet (50 mg total) by mouth 2 (two) times daily. 60 tablet 3  . losartan (COZAAR) 25 MG tablet Take 1 tablet (25 mg total) by mouth daily. Medication for your heart 30 tablet 3  . metoprolol succinate (TOPROL-XL) 25 MG 24 hr tablet Take 1 tablet (25 mg total) by mouth daily. Take with or immediately following a meal.    . omeprazole (PRILOSEC) 40 MG capsule Take 40 mg by mouth daily.    Marland Kitchen oxyCODONE-acetaminophen (ROXICET) 5-325 MG tablet Take 1 tablet by mouth every 4 (four) hours as needed for severe pain. 30 tablet 0  . pregabalin (LYRICA) 25 MG capsule Take 1 capsule (25 mg total) by mouth 3 (three) times daily.    . promethazine (PHENERGAN) 25 MG tablet Take 25 mg by mouth every 6 (six) hours as needed for nausea or vomiting.    . senna-docusate (SENOKOT S) 8.6-50 MG tablet Take 1 tablet by mouth daily.    . sodium bicarbonate 650 MG tablet Take 1 tablet (650 mg total) by mouth 2 (two) times daily. 60 tablet 3  . vitamin C (ASCORBIC ACID) 500 MG tablet Take 500 mg by mouth daily.     No current facility-administered medications for this visit.     Past Surgical History  Procedure Laterality Date  . Breast surgery    . Cholecystectomy    . Abdominal hysterectomy    . Replacement total knee bilateral    . Hand tendon surgery    . Esophagogastroduodenoscopy N/A 12/03/2014    Procedure: ESOPHAGOGASTRODUODENOSCOPY (EGD);  Surgeon: Danie Binder, MD;  Location: [facility];  Service: Endoscopy;  Laterality: N/A;     Allergies  Allergen Reactions  . Mometasone Shortness Of Breath  . Vancomycin Itching  . Aspirin Other (See Comments)    "Inflames stomach"  . Augmentin [Amoxicillin-Pot Clavulanate]     Unknown reaction  . Contrast Media [Iodinated Diagnostic Agents] Other (See Comments)    Made Heart Stop.   . Cortisone Other (See Comments)    Hold fluid  . Doxycycline Nausea And Vomiting  . Ibuprofen Nausea And  Vomiting  . Insulins Other (See Comments)    Pt says even the tiniest bit of insulin makes her go unconscious because her BS gets too low  . Iodine     Unknown reaction  . Lasix [Furosemide] Other (See Comments)    Paradoxical Response  . Other     Iv bp med unknown.and adhesive tape-silicones  . Prednisone     Sweating   . Sulfamethoxazole Other (See Comments)    Bottomed  out platelets  . Tape   . Ultram [Tramadol Hcl] Other (See Comments)    "Grand mal seizure"  . Codeine Rash  . Dilantin [Phenytoin Sodium] Rash  . Latex Rash  . Piperacillin Sod-Tazobactam So Rash      Family History  Problem Relation Age of Onset  . Cancer Paternal Grandfather   . Diabetes Sister   . Diabetes Sister   . Heart disease Maternal Grandmother   . Heart disease Father   . Hyperlipidemia       Social History Ms. Westrich reports that she has never smoked. She has never used smokeless tobacco. Ms. Littrell reports that she does not drink alcohol.   Review of Systems CONSTITUTIONAL: No weight loss, fever, chills, weakness or fatigue.  HEENT: Eyes: No visual loss, blurred vision, double vision or yellow sclerae.No hearing loss, sneezing, congestion, runny nose or sore throat.  SKIN: No rash or itching.  CARDIOVASCULAR:  RESPIRATORY: No shortness of breath, cough or sputum.  GASTROINTESTINAL: No anorexia, nausea, vomiting or diarrhea. No abdominal pain or blood.  GENITOURINARY: No burning on urination, no polyuria NEUROLOGICAL: No headache, dizziness, syncope, paralysis, ataxia, numbness or tingling in the extremities. No change in bowel or bladder control.  MUSCULOSKELETAL: No muscle, back pain, joint pain or stiffness.  LYMPHATICS: No enlarged nodes. No history of splenectomy.  PSYCHIATRIC: No history of depression or anxiety.  ENDOCRINOLOGIC: No reports of sweating, cold or heat intolerance. No polyuria or polydipsia.  Marland Kitchen   Physical Examination There were no vitals filed for this  visit. There were no vitals filed for this visit.  Gen: resting comfortably, no acute distress HEENT: no scleral icterus, pupils equal round and reactive, no palptable cervical adenopathy,  CV Resp: Clear to auscultation bilaterally GI: abdomen is soft, non-tender, non-distended, normal bowel sounds, no hepatosplenomegaly MSK: extremities are warm, no edema.  Skin: warm, no rash Neuro:  no focal deficits Psych: appropriate affect   Diagnostic Studies 08/2014 echo Study Conclusions  - Left ventricle: The cavity size was normal. Wall thickness was normal. Systolic function was normal. The estimated ejection fraction was in the range of 60% to 65%. Wall motion was normal; there were no regional wall motion abnormalities. Left ventricular diastolic function parameters were normal. - Aortic valve: Mildly to moderately calcified annulus. Trileaflet; mildly thickened leaflets. Valve area (VTI): 1.6 cm^2. Valve area (Vmax): 1.56 cm^2. - Mitral valve: Mildly calcified annulus. Normal thickness leaflets . - Right ventricle: The cavity size was moderately dilated. - Right atrium: The interatrial septum bows to the left consistent with elevated RA pressure. The atrium was mildly to moderately dilated. - Tricuspid valve: There was mild-moderate regurgitation. The TR vena contracta is 0.3 cm. - Pulmonic valve: Not well visualized. There is at least moderate regurgitation. There is no significant stenosis. - Pulmonary arteries: Systolic pressure was mildly increased. PA peak pressure: 35 mm Hg (S). - Systemic veins: IVC is normal size with decreased respiratory variation. Estimated RA pressure is 8 mmHg. - Technically difficult study.    Assessment and Plan  1. Pulmonic insufficiency - followed at [facility] - recent admit at [facility] with sepsis. TTE at that time poor visualization of pulmonary valve, at least moderate PI. Signs of RV enlargement -  further workup per [facility] cards, agree with consideration for cardiac MRI. Her renal function has significantly improved so MRI would be possible. Her dye allergy appears to only be to CT dye, she has had prior MRI with contrast in our  system (at least 2)       Arnoldo Lenis, M.D., F.A.C.C.

## 2015-05-07 DEATH — deceased

## 2016-03-27 IMAGING — CT CT RENAL STONE PROTOCOL
2 of 4 series · 15 of 46 positions shown, 17 images · non-contrast
Comparison: 12/11/2014

CLINICAL DATA: Generalized abdominal pain and vomiting for 1 day.

EXAM:
CT ABDOMEN AND PELVIS WITHOUT CONTRAST
TECHNIQUE: Multidetector CT imaging of the abdomen and pelvis was performed
following the standard protocol without IV contrast.

[Series 2: standard/full over (age)lbs 5.0 · axial · 0.61mm/px · z∈[-329,+56]mm · 12 of 85 slices shown, 14 images]
[im 4/85  soft-tissue]
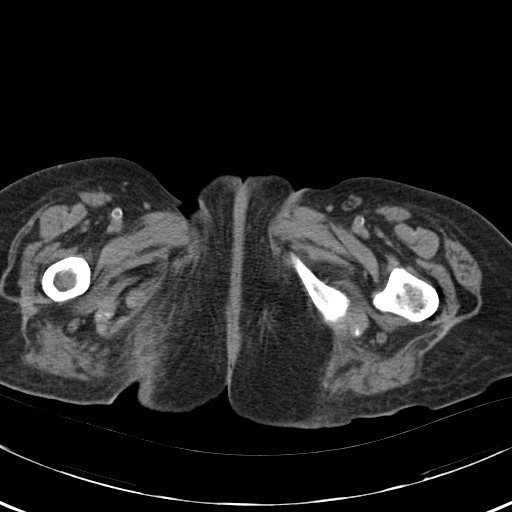
[im 4/85  bone]
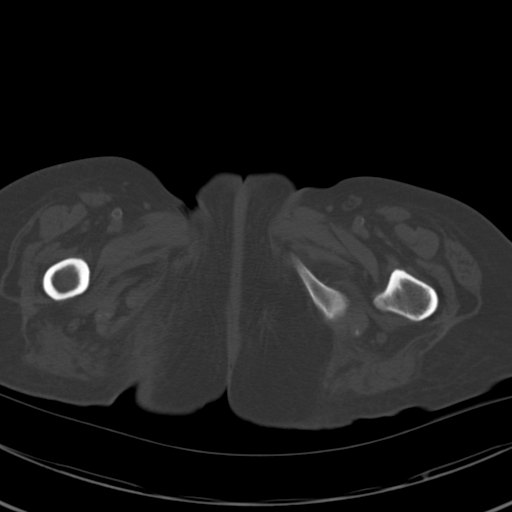
[im 11/85  soft-tissue]
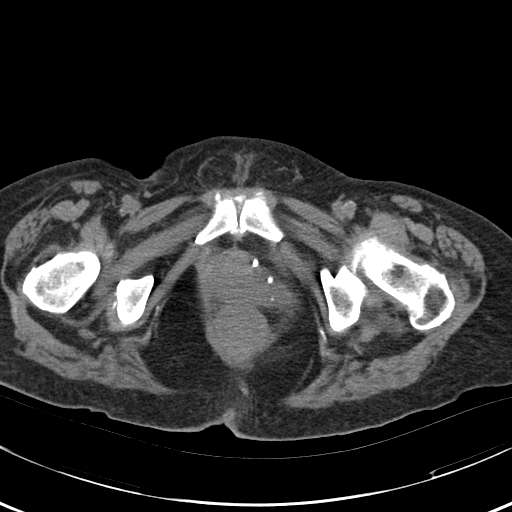
[im 18/85  soft-tissue]
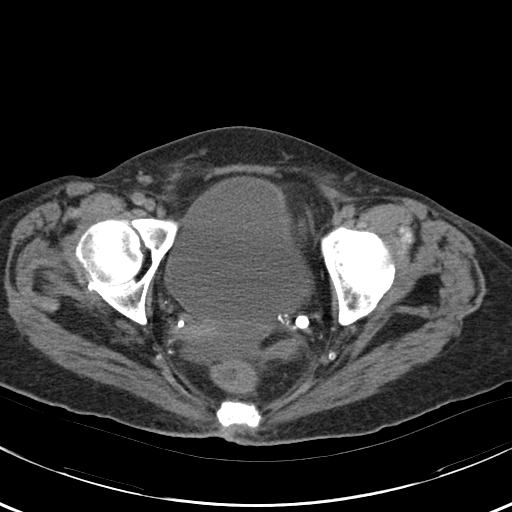
[im 25/85  soft-tissue]
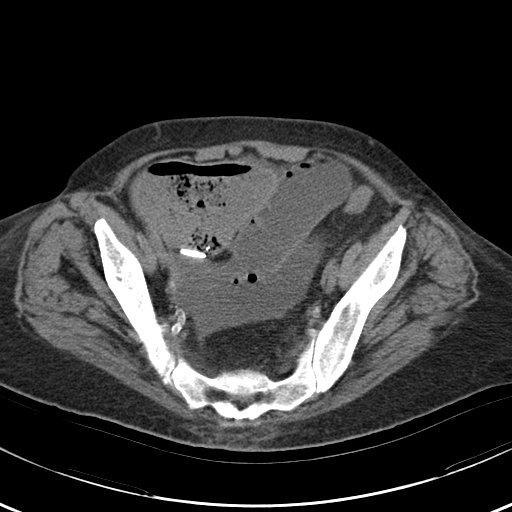
[im 32/85  soft-tissue]
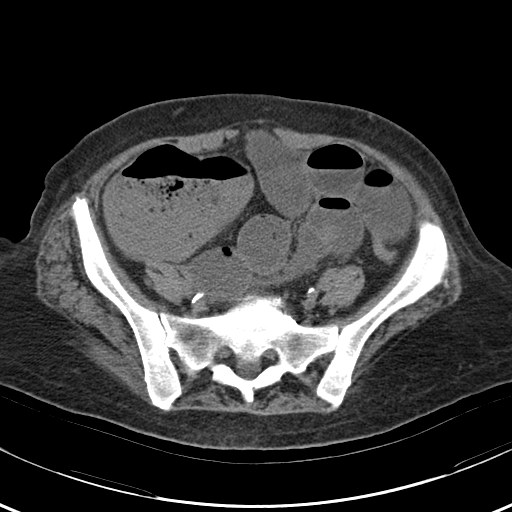
[im 39/85  soft-tissue]
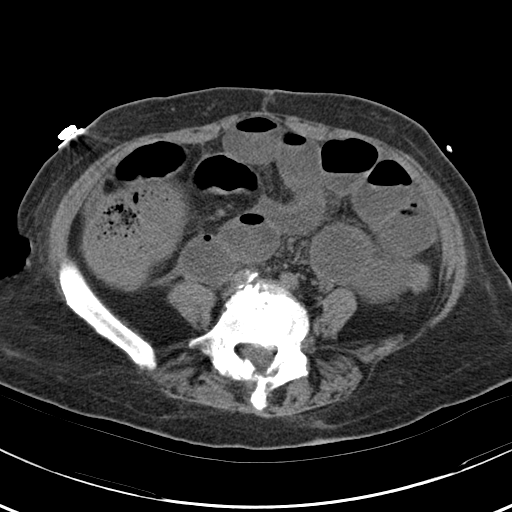
[im 46/85  soft-tissue]
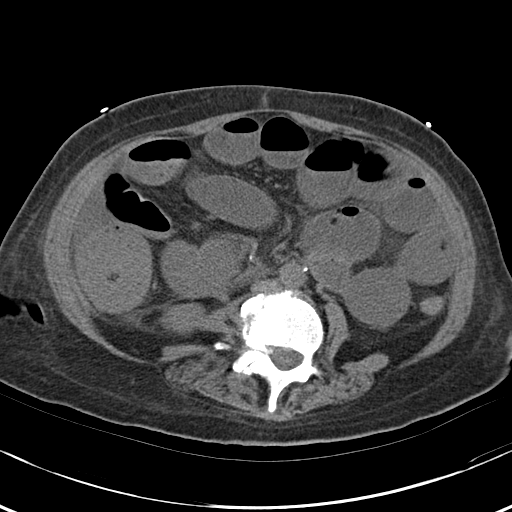
[im 53/85  soft-tissue]
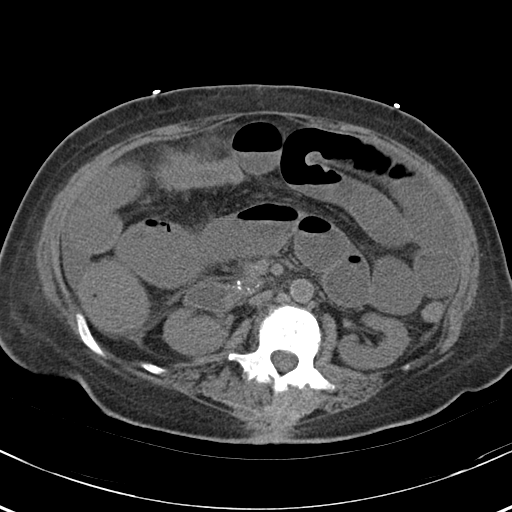
[im 60/85  soft-tissue]
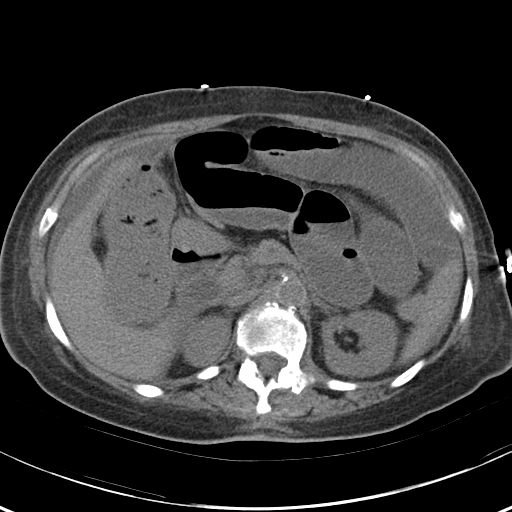
[im 60/85  bone]
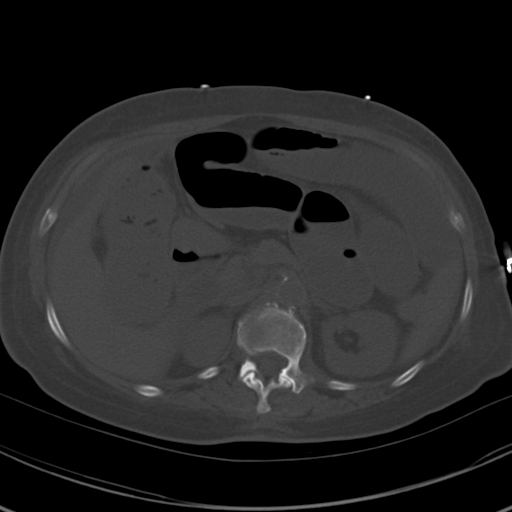
[im 67/85  soft-tissue]
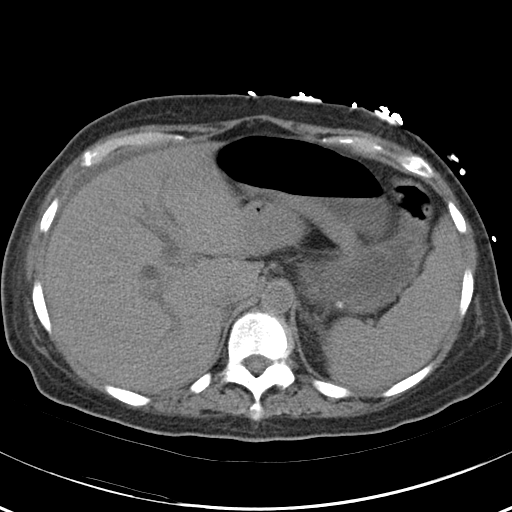
[im 74/85  soft-tissue]
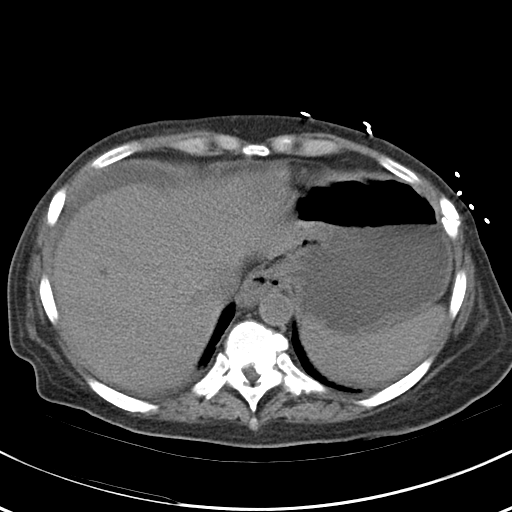
[im 81/85  soft-tissue]
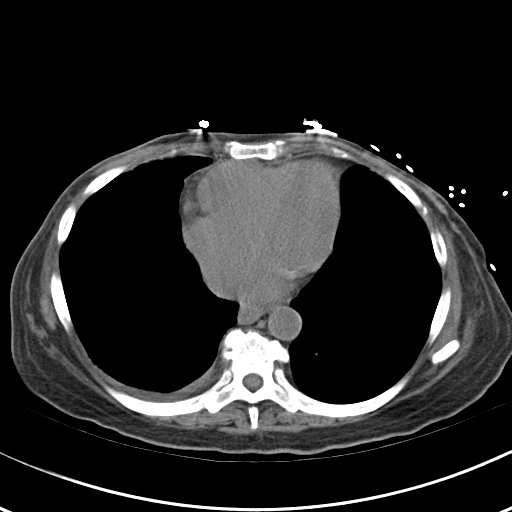

[Series 4: mpr coronal · coronal · 0.63mm/px · 3 of 75 slices shown]
[im 25/75  soft-tissue]
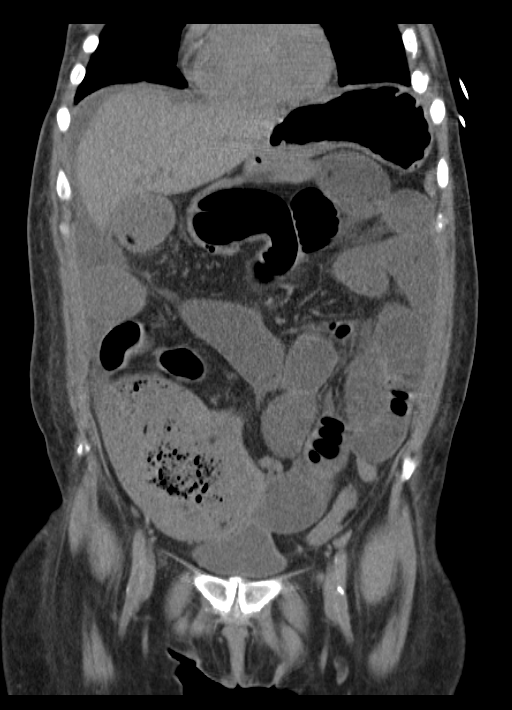
[im 33/75  soft-tissue]
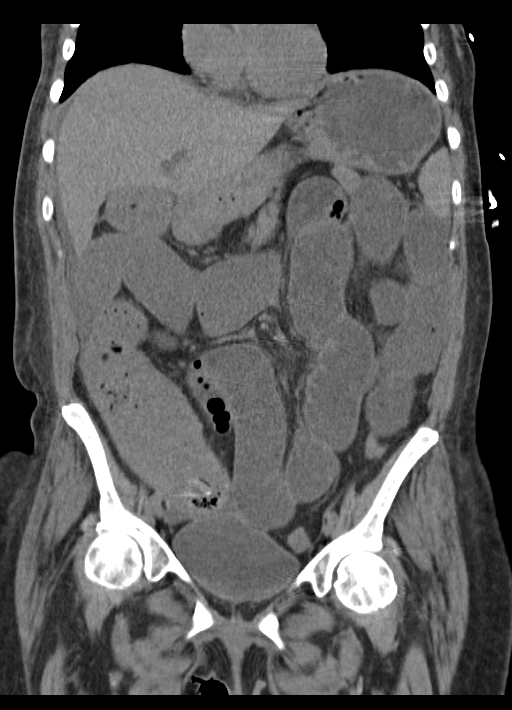
[im 42/75  soft-tissue]
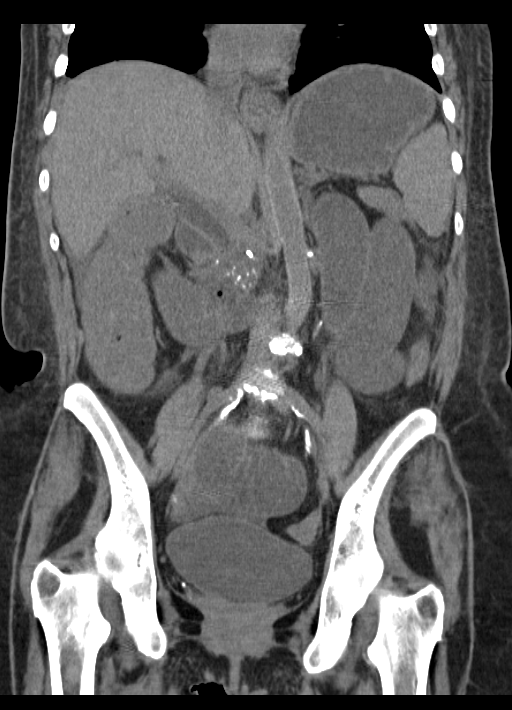

[15 of 46 positions shown; findings below may reference images not displayed]

FINDINGS: Lower chest: Small right pleural effusion. Atherosclerotic disease
of the coronary arteries.

Hepatobiliary: No mass visualized on this un-enhanced exam. Status
post cholecystectomy.

Pancreas: The pancreas is atrophic. Coarse calcifications are seen
within the head of the pancreas or within the peripancreatic fat.

Spleen: Within normal limits in size.

Adrenals/Urinary Tract: No evidence of urolithiasis or
hydronephrosis. No definite mass visualized on this un-enhanced
exam. Right kidney is atrophic.

Stomach/Bowel: There is a diffuse fluid distention of small bowel
loops to the level of the terminal ileum with maximum diameter of 37
mm. The transitional point appears to be at the level of the distal
ileum in the right lower quadrant, and with there are inflammatory
mesenteric changes. The cecum is distended. The transverse and left
colon and decompressed. Un apparent abrupt narrowing is seen at the
level of the proximal transverse colon comminuted transitional point
of small-bowel obstruction.

Vascular/Lymphatic: No pathologically enlarged lymph nodes. No
evidence of abdominal aortic aneurysm. Atherosclerotic disease of
the aorta is seen.

Reproductive: No mass or other significant abnormality.

Other: Moderate amount of abdominal ascites.  Anasarca.

Musculoskeletal: S shaped scoliosis with secondary osteoarthritic
changes. Mottled appear over a rounds of L2 vertebral body may be
metastatic.
IMPRESSION: Small bowel obstruction with transitional point at the distal ileum.
In cross proximity, there is also an apparent narrowing of the
proximal transverse colon causing enlargement of the cecum.

Ascites.

Coarse calcifications within an atrophic appearing head of the
pancreas. This may represent a chronic pancreatitis, or low grade
pancreatic malignancy. This could be further evaluated with MRI when
found clinically appropriate.

Mottled appearance of L2 vertebral body, which may represent a
metastatic lesion.

Anasarca.

Small right pleural effusion.

## 2023-02-16 ENCOUNTER — Other Ambulatory Visit (HOSPITAL_COMMUNITY): Payer: Self-pay
